# Patient Record
Sex: Female | Born: 1945
Health system: Southern US, Community
[De-identification: ages and names within clinical notes are randomized; demographics above are authoritative.]

## PROBLEM LIST (undated history)

## (undated) DIAGNOSIS — G4733 Obstructive sleep apnea (adult) (pediatric): Secondary | ICD-10-CM

## (undated) DIAGNOSIS — I517 Cardiomegaly: Secondary | ICD-10-CM

## (undated) DIAGNOSIS — I219 Acute myocardial infarction, unspecified: Secondary | ICD-10-CM

## (undated) DIAGNOSIS — M1990 Unspecified osteoarthritis, unspecified site: Secondary | ICD-10-CM

## (undated) DIAGNOSIS — M25569 Pain in unspecified knee: Secondary | ICD-10-CM

## (undated) DIAGNOSIS — E785 Hyperlipidemia, unspecified: Secondary | ICD-10-CM

## (undated) DIAGNOSIS — H00029 Hordeolum internum unspecified eye, unspecified eyelid: Secondary | ICD-10-CM

## (undated) DIAGNOSIS — I10 Essential (primary) hypertension: Secondary | ICD-10-CM

## (undated) DIAGNOSIS — N760 Acute vaginitis: Secondary | ICD-10-CM

## (undated) DIAGNOSIS — R300 Dysuria: Secondary | ICD-10-CM

## (undated) DIAGNOSIS — Z860100 Personal history of colon polyps, unspecified: Secondary | ICD-10-CM

## (undated) DIAGNOSIS — N952 Postmenopausal atrophic vaginitis: Secondary | ICD-10-CM

## (undated) DIAGNOSIS — B373 Candidiasis of vulva and vagina: Secondary | ICD-10-CM

## (undated) DIAGNOSIS — K219 Gastro-esophageal reflux disease without esophagitis: Secondary | ICD-10-CM

## (undated) DIAGNOSIS — E039 Hypothyroidism, unspecified: Secondary | ICD-10-CM

## (undated) DIAGNOSIS — L0291 Cutaneous abscess, unspecified: Secondary | ICD-10-CM

## (undated) DIAGNOSIS — B3731 Acute candidiasis of vulva and vagina: Secondary | ICD-10-CM

## (undated) DIAGNOSIS — R339 Retention of urine, unspecified: Secondary | ICD-10-CM

## (undated) DIAGNOSIS — H019 Unspecified inflammation of eyelid: Secondary | ICD-10-CM

## (undated) DIAGNOSIS — Z9841 Cataract extraction status, right eye: Secondary | ICD-10-CM

## (undated) DIAGNOSIS — N1330 Unspecified hydronephrosis: Secondary | ICD-10-CM

## (undated) DIAGNOSIS — Z9842 Cataract extraction status, left eye: Secondary | ICD-10-CM

## (undated) DIAGNOSIS — A6000 Herpesviral infection of urogenital system, unspecified: Secondary | ICD-10-CM

## (undated) DIAGNOSIS — G4730 Sleep apnea, unspecified: Secondary | ICD-10-CM

## (undated) DIAGNOSIS — R319 Hematuria, unspecified: Secondary | ICD-10-CM

## (undated) DIAGNOSIS — R109 Unspecified abdominal pain: Secondary | ICD-10-CM

## (undated) DIAGNOSIS — R040 Epistaxis: Secondary | ICD-10-CM

## (undated) DIAGNOSIS — B009 Herpesviral infection, unspecified: Secondary | ICD-10-CM

## (undated) DIAGNOSIS — N361 Urethral diverticulum: Secondary | ICD-10-CM

## (undated) DIAGNOSIS — J309 Allergic rhinitis, unspecified: Secondary | ICD-10-CM

## (undated) DIAGNOSIS — M5430 Sciatica, unspecified side: Secondary | ICD-10-CM

## (undated) DIAGNOSIS — Z7902 Long term (current) use of antithrombotics/antiplatelets: Secondary | ICD-10-CM

## (undated) DIAGNOSIS — E538 Deficiency of other specified B group vitamins: Secondary | ICD-10-CM

## (undated) DIAGNOSIS — H9209 Otalgia, unspecified ear: Secondary | ICD-10-CM

## (undated) DIAGNOSIS — N390 Urinary tract infection, site not specified: Secondary | ICD-10-CM

## (undated) DIAGNOSIS — R001 Bradycardia, unspecified: Secondary | ICD-10-CM

## (undated) DIAGNOSIS — K5790 Diverticulosis of intestine, part unspecified, without perforation or abscess without bleeding: Secondary | ICD-10-CM

## (undated) DIAGNOSIS — L0390 Cellulitis, unspecified: Secondary | ICD-10-CM

## (undated) DIAGNOSIS — K649 Unspecified hemorrhoids: Secondary | ICD-10-CM

## (undated) DIAGNOSIS — E559 Vitamin D deficiency, unspecified: Secondary | ICD-10-CM

## (undated) DIAGNOSIS — E2839 Other primary ovarian failure: Secondary | ICD-10-CM

## (undated) DIAGNOSIS — R0602 Shortness of breath: Secondary | ICD-10-CM

## (undated) DIAGNOSIS — I452 Bifascicular block: Secondary | ICD-10-CM

## (undated) DIAGNOSIS — Z9889 Other specified postprocedural states: Secondary | ICD-10-CM

## (undated) DIAGNOSIS — I700 Atherosclerosis of aorta: Secondary | ICD-10-CM

## (undated) HISTORY — DX: Herpesviral infection, unspecified: B00.9

## (undated) HISTORY — DX: Other primary ovarian failure: E28.39

## (undated) HISTORY — DX: Unspecified hemorrhoids: K64.9

## (undated) HISTORY — DX: Acute candidiasis of vulva and vagina: B37.31

## (undated) HISTORY — DX: Sciatica, unspecified side: M54.30

## (undated) HISTORY — PX: OTHER SURGICAL HISTORY: SHX169

## (undated) HISTORY — PX: COLON SURGERY: SHX602

## (undated) HISTORY — DX: Unspecified abdominal pain: R10.9

## (undated) HISTORY — DX: Cutaneous abscess, unspecified: L02.91

## (undated) HISTORY — DX: Cellulitis, unspecified: L03.90

## (undated) HISTORY — DX: Acute vaginitis: N76.0

## (undated) HISTORY — DX: Postmenopausal atrophic vaginitis: N95.2

## (undated) HISTORY — DX: Candidiasis of vulva and vagina: B37.3

## (undated) HISTORY — DX: Essential (primary) hypertension: I10

## (undated) HISTORY — DX: Unspecified hydronephrosis: N13.30

## (undated) HISTORY — PX: BREAST BIOPSY: SHX20

## (undated) HISTORY — DX: Hypothyroidism, unspecified: E03.9

## (undated) HISTORY — DX: Hematuria, unspecified: R31.9

## (undated) HISTORY — DX: Otalgia, unspecified ear: H92.09

## (undated) HISTORY — DX: Retention of urine, unspecified: R33.9

## (undated) HISTORY — DX: Pain in unspecified knee: M25.569

## (undated) HISTORY — DX: Allergic rhinitis, unspecified: J30.9

## (undated) HISTORY — DX: Hordeolum internum unspecified eye, unspecified eyelid: H00.029

## (undated) HISTORY — DX: Dysuria: R30.0

## (undated) HISTORY — PX: ECTOPIC PREGNANCY SURGERY: SHX613

## (undated) HISTORY — DX: Herpesviral infection of urogenital system, unspecified: A60.00

## (undated) HISTORY — DX: Shortness of breath: R06.02

## (undated) HISTORY — DX: Urethral diverticulum: N36.1

## (undated) HISTORY — DX: Urinary tract infection, site not specified: N39.0

## (undated) HISTORY — PX: HERNIA REPAIR: SHX51

## (undated) HISTORY — DX: Diverticulosis of intestine, part unspecified, without perforation or abscess without bleeding: K57.90

## (undated) HISTORY — DX: Unspecified inflammation of eyelid: H01.9

---

## 2004-04-01 ENCOUNTER — Other Ambulatory Visit: Payer: Self-pay

## 2004-08-06 ENCOUNTER — Inpatient Hospital Stay: Payer: Self-pay | Admitting: Internal Medicine

## 2005-08-07 ENCOUNTER — Ambulatory Visit: Payer: Self-pay

## 2006-01-07 ENCOUNTER — Ambulatory Visit: Payer: Self-pay

## 2006-07-23 ENCOUNTER — Ambulatory Visit: Payer: Self-pay | Admitting: Obstetrics and Gynecology

## 2006-07-29 ENCOUNTER — Ambulatory Visit: Payer: Self-pay | Admitting: Obstetrics and Gynecology

## 2006-09-08 ENCOUNTER — Other Ambulatory Visit: Payer: Self-pay

## 2006-09-15 ENCOUNTER — Ambulatory Visit: Payer: Self-pay | Admitting: Obstetrics and Gynecology

## 2007-03-19 ENCOUNTER — Ambulatory Visit: Payer: Self-pay | Admitting: Obstetrics and Gynecology

## 2007-07-19 ENCOUNTER — Ambulatory Visit: Payer: Self-pay | Admitting: Internal Medicine

## 2008-05-17 ENCOUNTER — Ambulatory Visit: Payer: Self-pay | Admitting: Obstetrics and Gynecology

## 2009-11-13 ENCOUNTER — Ambulatory Visit: Payer: Self-pay | Admitting: Obstetrics and Gynecology

## 2010-03-28 ENCOUNTER — Ambulatory Visit: Payer: Self-pay | Admitting: Internal Medicine

## 2010-05-17 ENCOUNTER — Ambulatory Visit: Payer: Self-pay | Admitting: Internal Medicine

## 2010-05-17 ENCOUNTER — Inpatient Hospital Stay: Payer: Self-pay | Admitting: Surgery

## 2010-05-31 ENCOUNTER — Emergency Department: Payer: Self-pay | Admitting: Emergency Medicine

## 2010-06-03 ENCOUNTER — Ambulatory Visit: Payer: Self-pay | Admitting: Surgery

## 2010-06-29 ENCOUNTER — Inpatient Hospital Stay: Payer: Self-pay | Admitting: Surgery

## 2010-07-19 ENCOUNTER — Ambulatory Visit: Payer: Self-pay | Admitting: Internal Medicine

## 2010-08-13 ENCOUNTER — Ambulatory Visit: Payer: Self-pay | Admitting: Surgery

## 2010-08-19 ENCOUNTER — Ambulatory Visit: Payer: Self-pay | Admitting: Anesthesiology

## 2010-08-20 ENCOUNTER — Inpatient Hospital Stay: Payer: Self-pay | Admitting: Surgery

## 2010-08-22 LAB — PATHOLOGY REPORT

## 2010-08-28 ENCOUNTER — Emergency Department: Payer: Self-pay | Admitting: Emergency Medicine

## 2010-09-03 ENCOUNTER — Ambulatory Visit: Payer: Self-pay | Admitting: Cardiovascular Disease

## 2011-02-27 ENCOUNTER — Emergency Department: Payer: Self-pay | Admitting: Emergency Medicine

## 2011-07-15 ENCOUNTER — Ambulatory Visit: Payer: Self-pay

## 2011-08-28 IMAGING — CR DG CHEST 1V PORT
1 series · 1 of 1 positions shown · non-contrast
Comparison: none

REASON FOR EXAM: pre op
COMMENTS:

PROCEDURE:     DXR - DXR PORTABLE CHEST SINGLE VIEW  - May 17, 2010  [DATE]
RESULT:     The lungs are mildly hypoinflated but the film is taken in a
somewhat lordotic position. There is no focal infiltrate. The cardiac
silhouette is top normal in size. The pulmonary vascularity is not engorged.

[view not recorded]
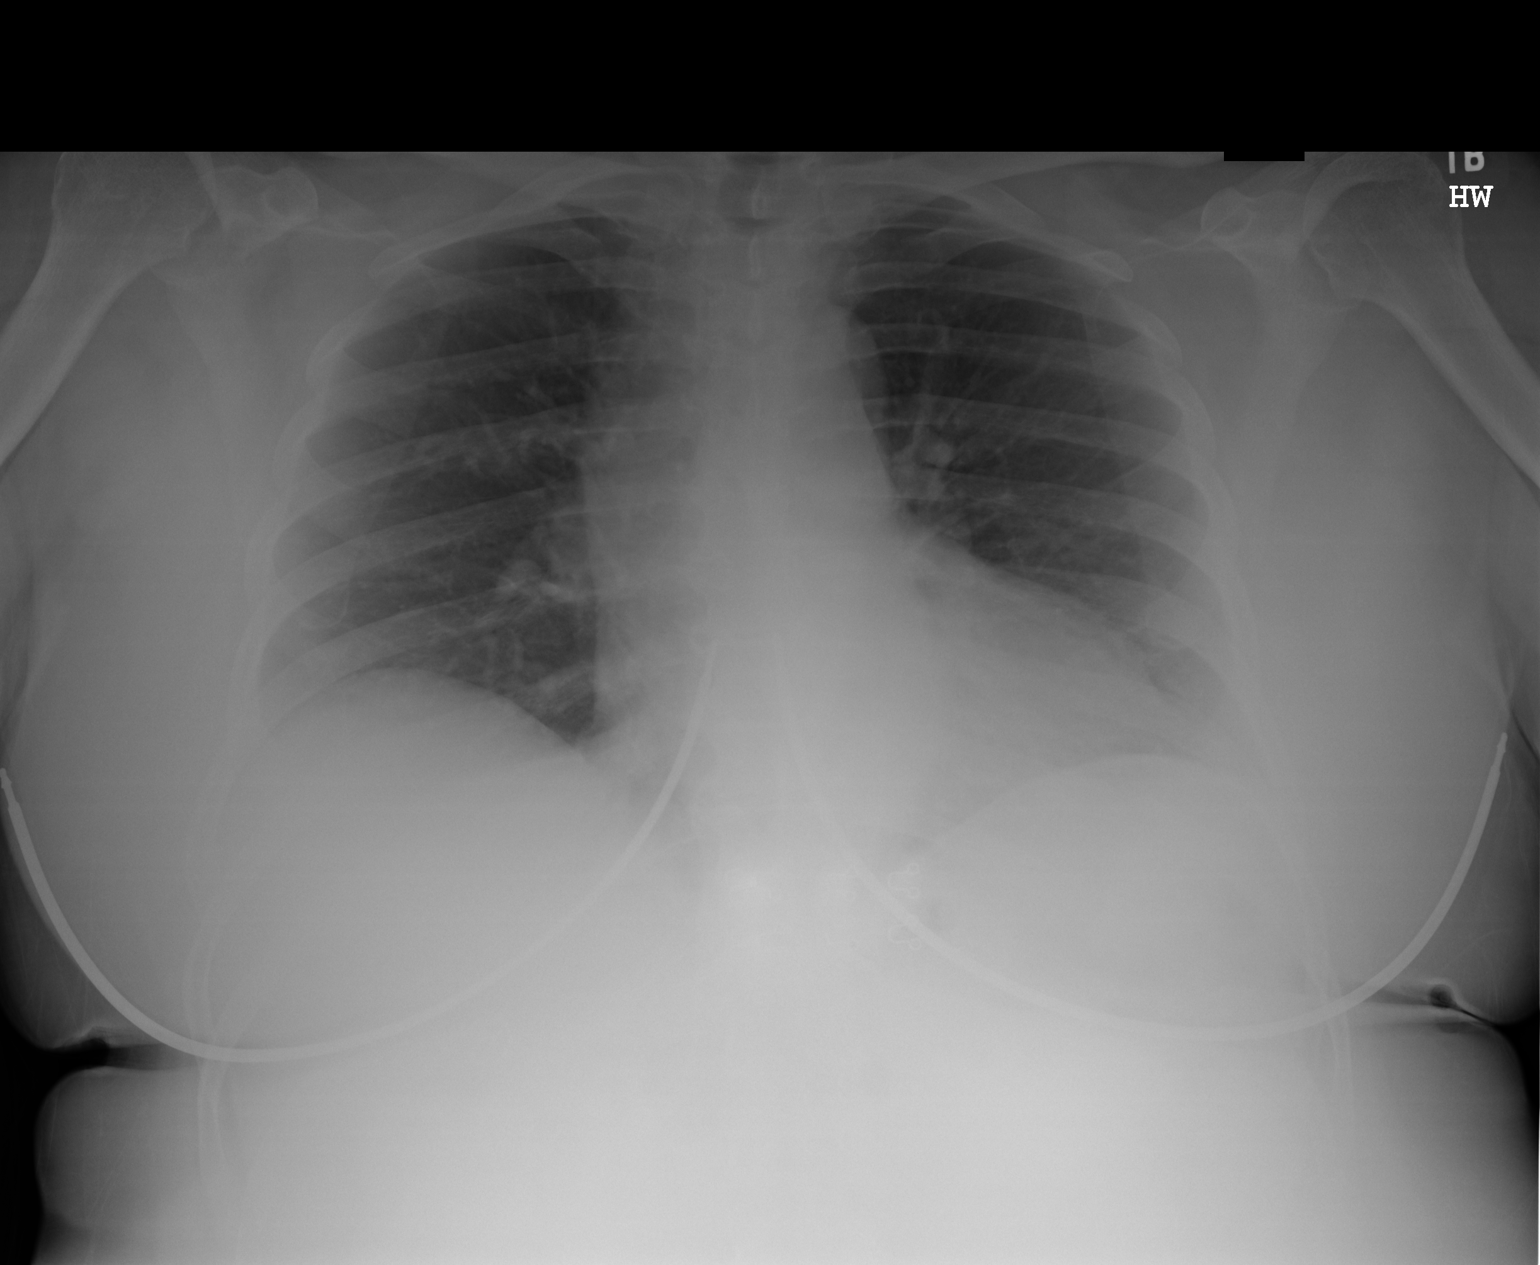

[1 of 1 positions shown; findings below may reference images not displayed]

IMPRESSION: I do not see evidence of acute cardiopulmonary abnormality.

## 2011-08-29 IMAGING — CR DG ABDOMEN 2V
1 series · 2 of 2 positions shown · non-contrast
Comparison: none

REASON FOR EXAM: Perforated diverticulitis
COMMENTS:

[Series 1: view not recorded · 0.17mm/px · 2 of 2 slices shown]
[im 1/2]
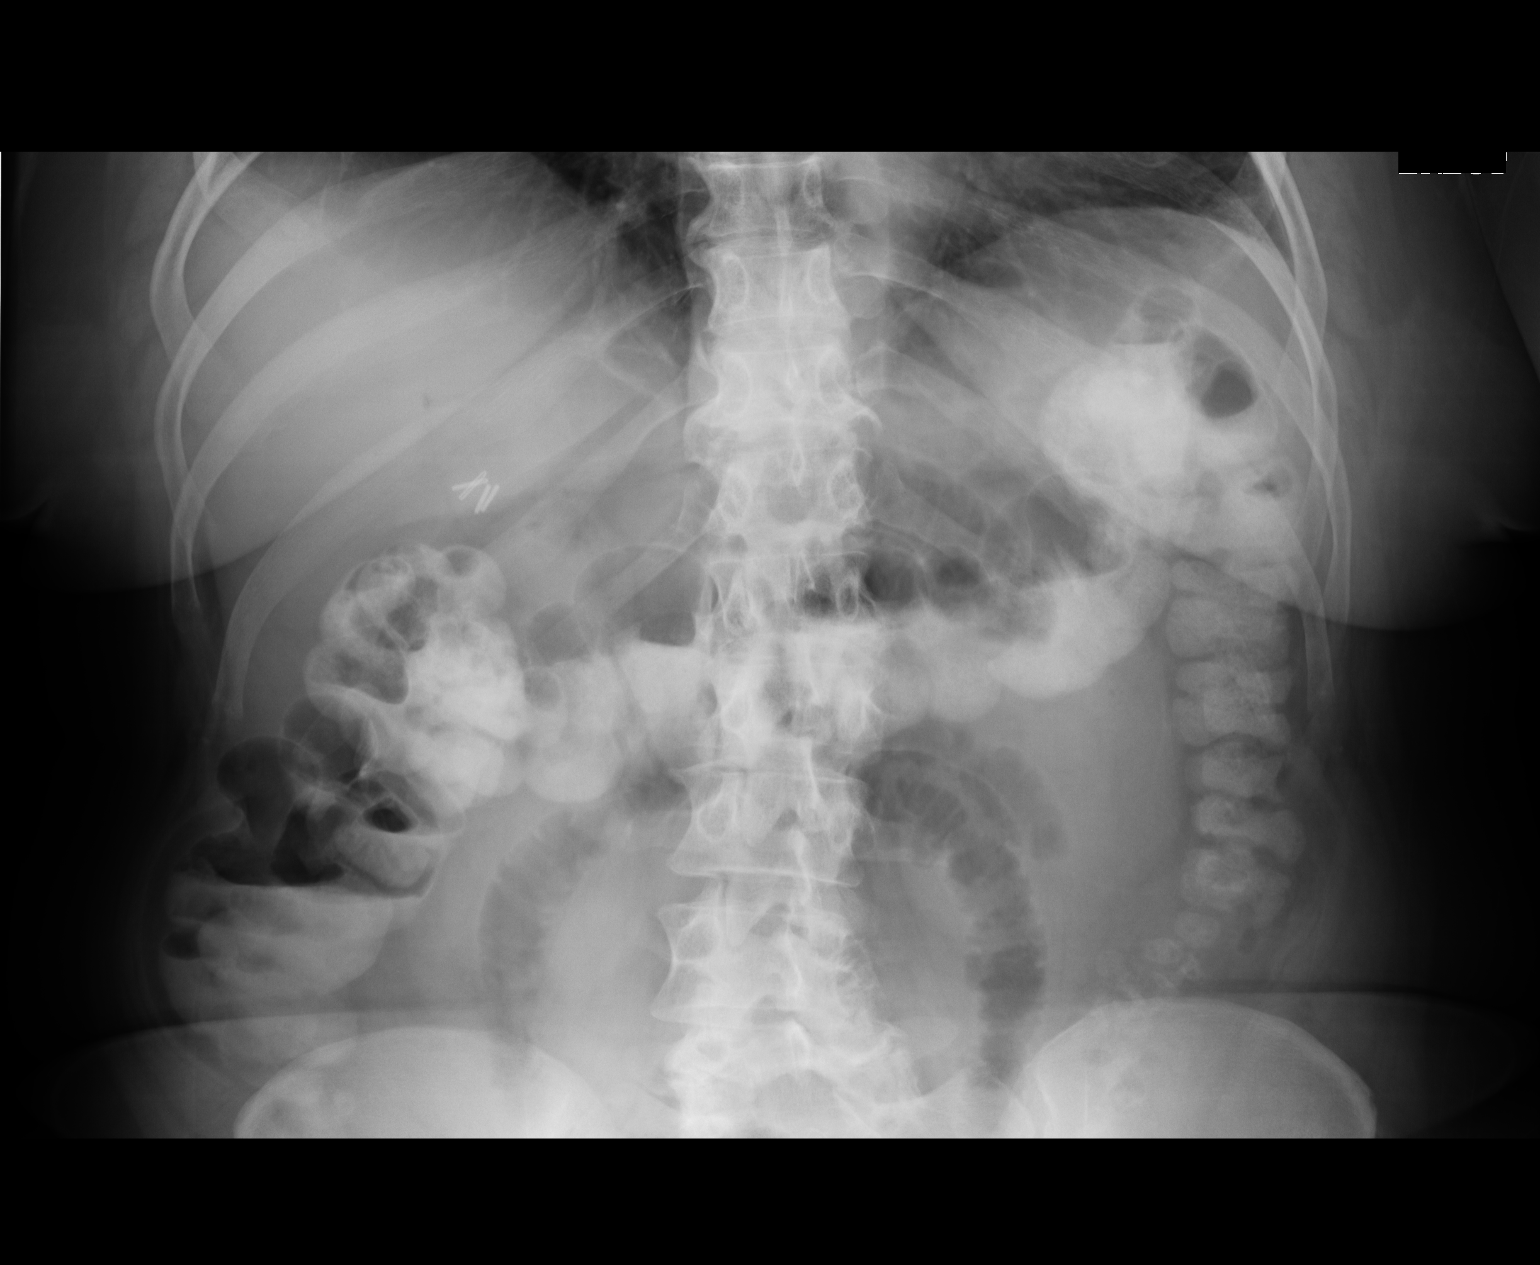
[im 2/2]
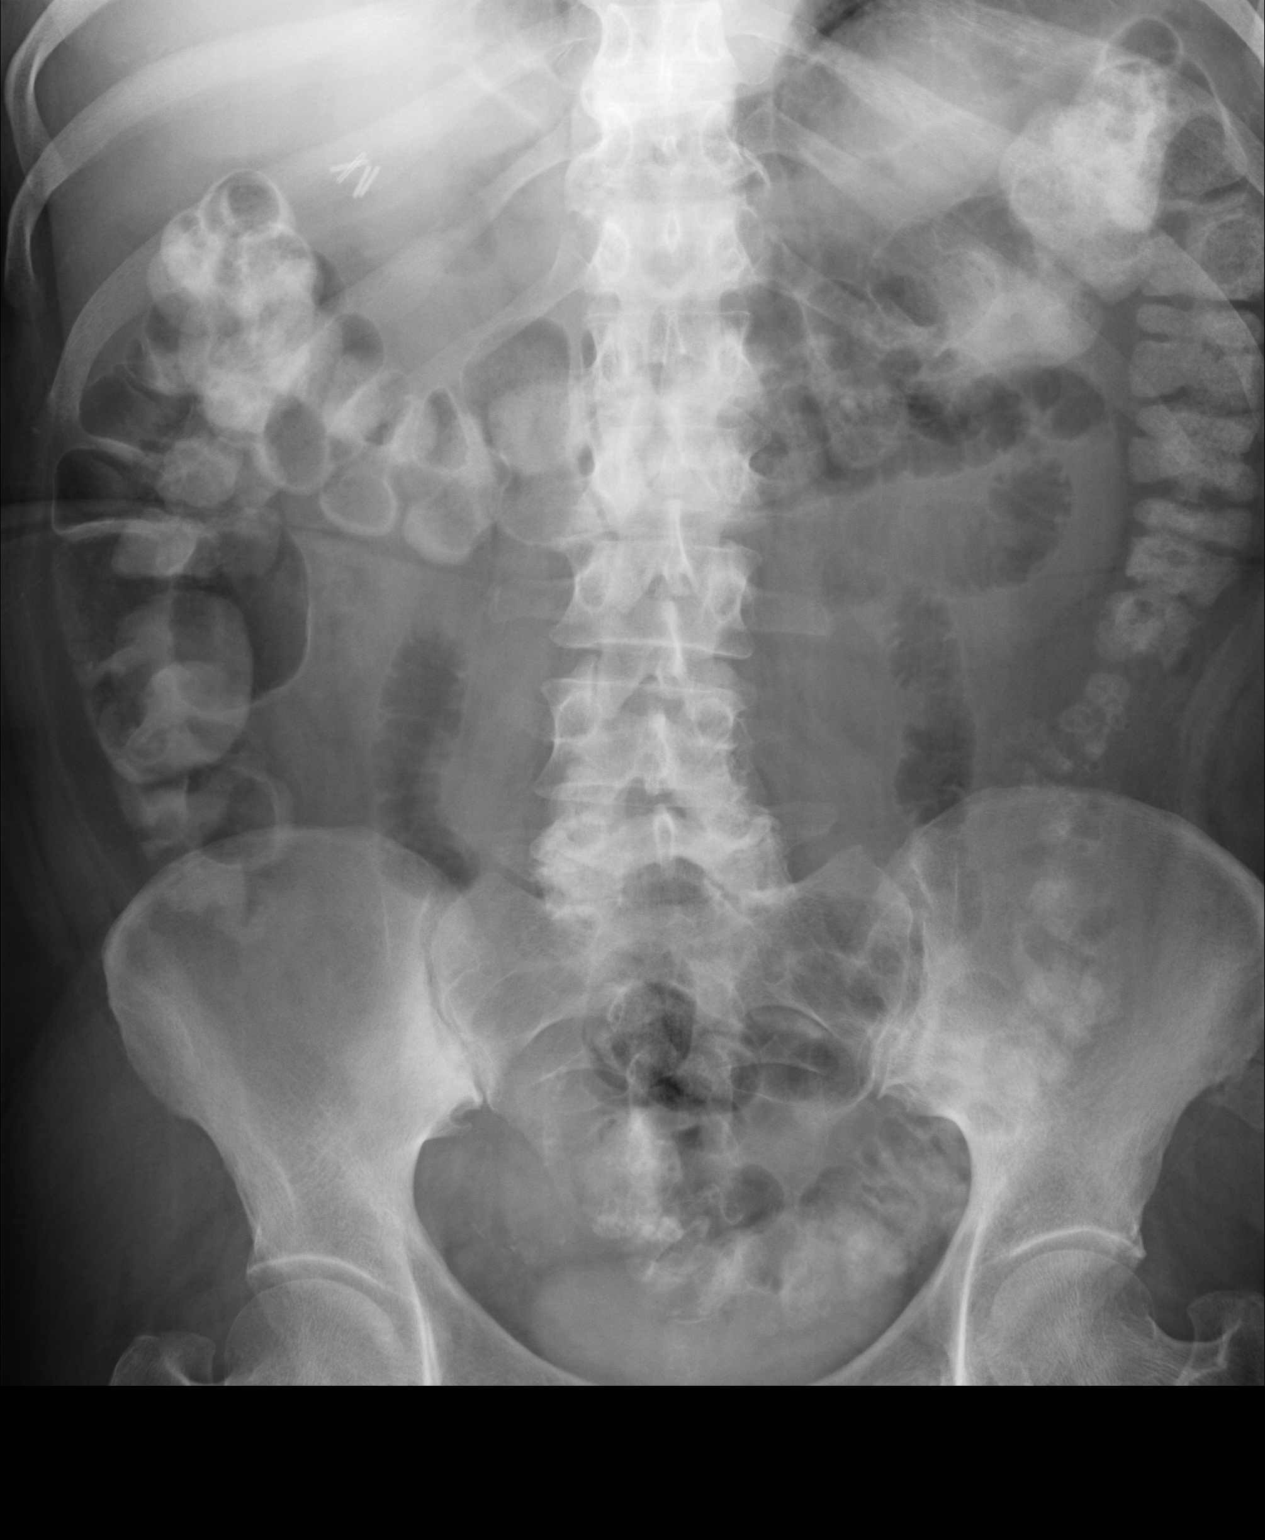

[2 of 2 positions shown; findings below may reference images not displayed]

PROCEDURE:     DXR - DXR ABDOMEN 2 V FLAT AND ERECT  - May 18, 2010  [DATE]

RESULT:     Supine and upright abdominal films are submitted. There is
contrast present within a relatively normal appearing colon from prior CT
scan 17 May, 2010. There is a small amount of air within minimally
prominent small bowel loops. There is contrast within the urinary bladder.
There are degenerative changes of the lower lumbar spine. There are surgical
clips in the gallbladder fossa.
IMPRESSION: I do not see evidence of bowel obstruction. Minimal
correction mildly distended gas-filled loops of small bowel in the midline
are present which may reflect a mild ileus.

## 2011-11-24 IMAGING — CR DG CHEST 2V
1 series · 2 of 2 positions shown · non-contrast
Comparison: none

REASON FOR EXAM: [DATE]----HTN
COMMENTS:

[Series 1: view not recorded · 0.17mm/px · 2 of 2 slices shown]
[im 1/2]
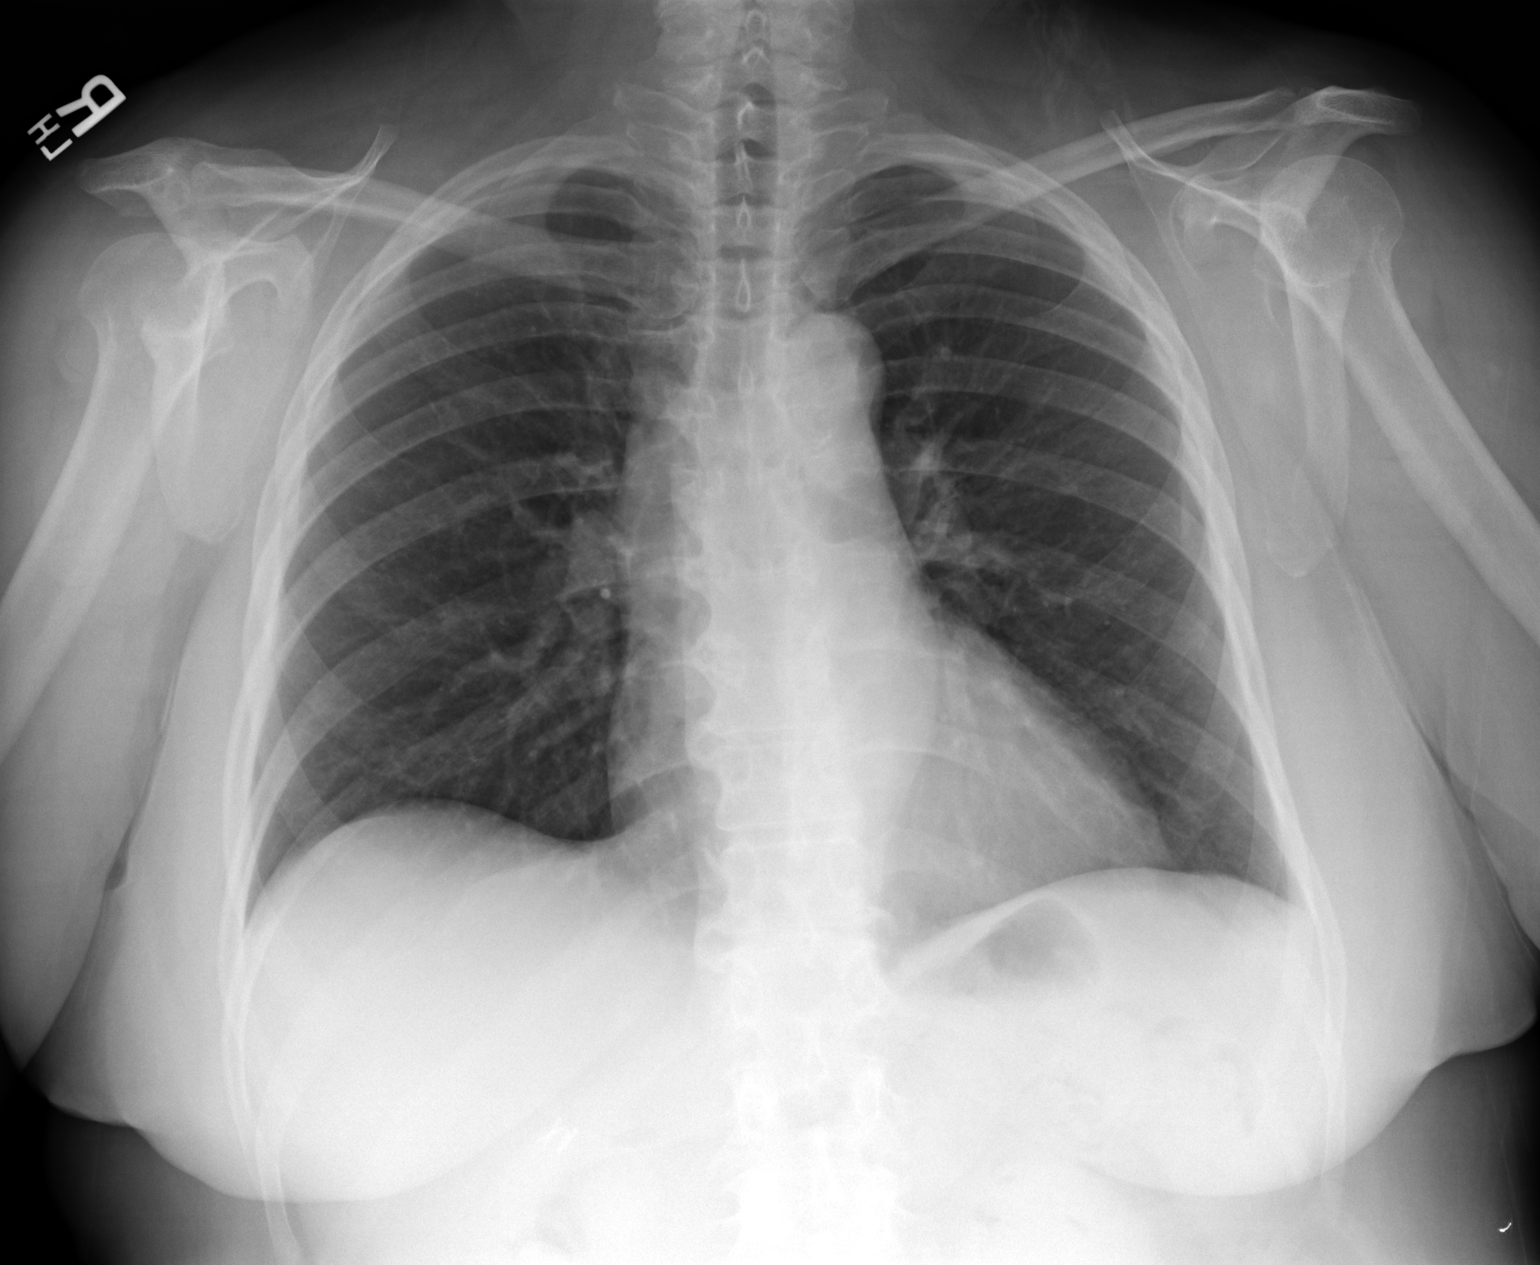
[im 2/2]
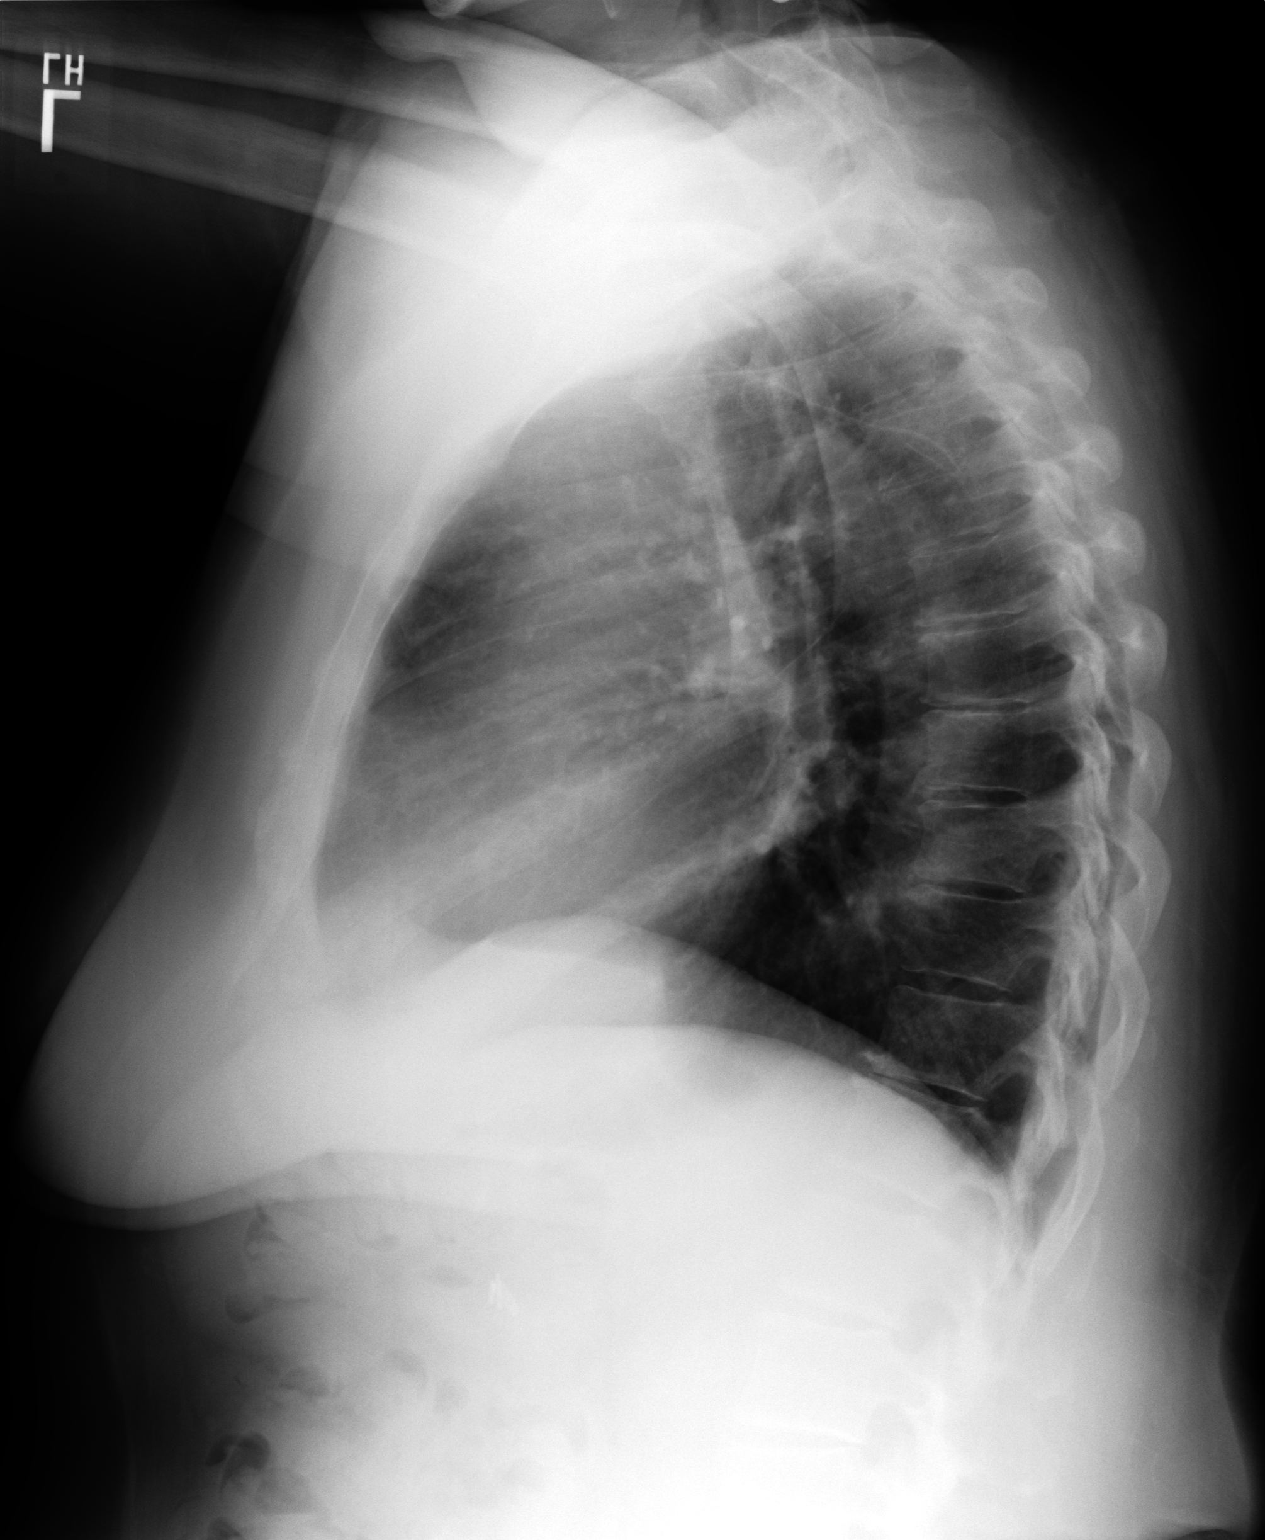

[2 of 2 positions shown; findings below may reference images not displayed]

PROCEDURE:     DXR - DXR CHEST PA (OR AP) AND LATERAL  - August 13, 2010  [DATE]

RESULT:     Comparison is made to the prior exam of 05/17/2010. The lung
fields are clear. The heart, mediastinal and osseous structures show no
acute changes. Hypertrophic spurring is present at multiple levels of the
thoracic spine.
IMPRESSION: 1. No acute changes are identified.
2. The lung fields are clear.
3. No findings suspicious for metastatic disease are identified.

## 2011-12-24 ENCOUNTER — Ambulatory Visit: Payer: Self-pay | Admitting: Surgery

## 2011-12-26 ENCOUNTER — Emergency Department: Payer: Self-pay | Admitting: Emergency Medicine

## 2011-12-29 ENCOUNTER — Emergency Department: Payer: Self-pay | Admitting: Emergency Medicine

## 2012-02-05 ENCOUNTER — Ambulatory Visit: Payer: Self-pay | Admitting: Surgery

## 2012-02-05 LAB — CBC WITH DIFFERENTIAL/PLATELET
Basophil #: 0 10*3/uL (ref 0.0–0.1)
Basophil %: 0.5 %
Eosinophil #: 0.2 10*3/uL (ref 0.0–0.7)
Eosinophil %: 3.1 %
HCT: 40.4 % (ref 35.0–47.0)
HGB: 13.7 g/dL (ref 12.0–16.0)
Lymphocyte #: 1.7 10*3/uL (ref 1.0–3.6)
Lymphocyte %: 27.9 %
Monocyte #: 0.5 x10 3/mm (ref 0.2–0.9)
Monocyte %: 8 %
Neutrophil %: 60.5 %
Platelet: 202 10*3/uL (ref 150–440)
WBC: 6 10*3/uL (ref 3.6–11.0)

## 2012-02-05 LAB — BASIC METABOLIC PANEL
Anion Gap: 7 (ref 7–16)
BUN: 18 mg/dL (ref 7–18)
Calcium, Total: 8.8 mg/dL (ref 8.5–10.1)
Chloride: 104 mmol/L (ref 98–107)
Co2: 29 mmol/L (ref 21–32)
EGFR (African American): >60
Glucose: 75 mg/dL (ref 65–99)
Osmolality: 280 (ref 275–301)
Potassium: 4.2 mmol/L (ref 3.5–5.1)
Sodium: 140 mmol/L (ref 136–145)

## 2012-02-13 ENCOUNTER — Inpatient Hospital Stay: Payer: Self-pay | Admitting: Surgery

## 2012-02-14 LAB — CBC WITH DIFFERENTIAL/PLATELET
Basophil #: 0 10*3/uL (ref 0.0–0.1)
Basophil %: 0.2 %
HCT: 36.3 % (ref 35.0–47.0)
HGB: 12.4 g/dL (ref 12.0–16.0)
Lymphocyte #: 1 10*3/uL (ref 1.0–3.6)
MCH: 29.7 pg (ref 26.0–34.0)
MCHC: 34.2 g/dL (ref 32.0–36.0)
Monocyte %: 7.1 %
Neutrophil %: 80.1 %
RBC: 4.18 10*6/uL (ref 3.80–5.20)
RDW: 14.3 % (ref 11.5–14.5)

## 2012-02-14 LAB — BASIC METABOLIC PANEL
Anion Gap: 9 (ref 7–16)
Calcium, Total: 7.7 mg/dL — ABNORMAL LOW (ref 8.5–10.1)
Chloride: 105 mmol/L (ref 98–107)
EGFR (African American): >60
EGFR (Non-African Amer.): >60
Osmolality: 280 (ref 275–301)
Potassium: 3.7 mmol/L (ref 3.5–5.1)
Sodium: 139 mmol/L (ref 136–145)

## 2012-09-16 ENCOUNTER — Ambulatory Visit: Payer: Self-pay

## 2013-12-16 ENCOUNTER — Ambulatory Visit: Payer: Self-pay

## 2014-01-26 ENCOUNTER — Ambulatory Visit: Payer: Self-pay | Admitting: Gastroenterology

## 2014-10-16 DIAGNOSIS — J301 Allergic rhinitis due to pollen: Secondary | ICD-10-CM | POA: Diagnosis not present

## 2014-10-16 DIAGNOSIS — J3081 Allergic rhinitis due to animal (cat) (dog) hair and dander: Secondary | ICD-10-CM | POA: Diagnosis not present

## 2014-10-16 DIAGNOSIS — J3089 Other allergic rhinitis: Secondary | ICD-10-CM | POA: Diagnosis not present

## 2014-10-25 DIAGNOSIS — R339 Retention of urine, unspecified: Secondary | ICD-10-CM | POA: Diagnosis not present

## 2014-10-25 DIAGNOSIS — N952 Postmenopausal atrophic vaginitis: Secondary | ICD-10-CM | POA: Diagnosis not present

## 2014-10-25 DIAGNOSIS — N390 Urinary tract infection, site not specified: Secondary | ICD-10-CM | POA: Diagnosis not present

## 2014-11-02 DIAGNOSIS — J3089 Other allergic rhinitis: Secondary | ICD-10-CM | POA: Diagnosis not present

## 2014-11-02 DIAGNOSIS — J301 Allergic rhinitis due to pollen: Secondary | ICD-10-CM | POA: Diagnosis not present

## 2014-11-02 DIAGNOSIS — J3081 Allergic rhinitis due to animal (cat) (dog) hair and dander: Secondary | ICD-10-CM | POA: Diagnosis not present

## 2014-11-08 DIAGNOSIS — J301 Allergic rhinitis due to pollen: Secondary | ICD-10-CM | POA: Diagnosis not present

## 2014-11-08 DIAGNOSIS — J3081 Allergic rhinitis due to animal (cat) (dog) hair and dander: Secondary | ICD-10-CM | POA: Diagnosis not present

## 2014-11-08 DIAGNOSIS — J3089 Other allergic rhinitis: Secondary | ICD-10-CM | POA: Diagnosis not present

## 2014-11-13 DIAGNOSIS — J301 Allergic rhinitis due to pollen: Secondary | ICD-10-CM | POA: Diagnosis not present

## 2014-11-13 DIAGNOSIS — J3089 Other allergic rhinitis: Secondary | ICD-10-CM | POA: Diagnosis not present

## 2014-11-13 DIAGNOSIS — J3081 Allergic rhinitis due to animal (cat) (dog) hair and dander: Secondary | ICD-10-CM | POA: Diagnosis not present

## 2014-11-23 DIAGNOSIS — B373 Candidiasis of vulva and vagina: Secondary | ICD-10-CM | POA: Diagnosis not present

## 2014-11-23 DIAGNOSIS — B009 Herpesviral infection, unspecified: Secondary | ICD-10-CM | POA: Diagnosis not present

## 2014-11-23 DIAGNOSIS — I10 Essential (primary) hypertension: Secondary | ICD-10-CM | POA: Diagnosis not present

## 2014-11-23 DIAGNOSIS — J3089 Other allergic rhinitis: Secondary | ICD-10-CM | POA: Diagnosis not present

## 2014-11-27 DIAGNOSIS — J3081 Allergic rhinitis due to animal (cat) (dog) hair and dander: Secondary | ICD-10-CM | POA: Diagnosis not present

## 2014-11-27 DIAGNOSIS — J3089 Other allergic rhinitis: Secondary | ICD-10-CM | POA: Diagnosis not present

## 2014-11-27 DIAGNOSIS — J301 Allergic rhinitis due to pollen: Secondary | ICD-10-CM | POA: Diagnosis not present

## 2014-12-11 DIAGNOSIS — J3089 Other allergic rhinitis: Secondary | ICD-10-CM | POA: Diagnosis not present

## 2014-12-11 DIAGNOSIS — J301 Allergic rhinitis due to pollen: Secondary | ICD-10-CM | POA: Diagnosis not present

## 2014-12-11 DIAGNOSIS — J3081 Allergic rhinitis due to animal (cat) (dog) hair and dander: Secondary | ICD-10-CM | POA: Diagnosis not present

## 2014-12-20 DIAGNOSIS — Z1231 Encounter for screening mammogram for malignant neoplasm of breast: Secondary | ICD-10-CM | POA: Diagnosis not present

## 2014-12-27 DIAGNOSIS — R339 Retention of urine, unspecified: Secondary | ICD-10-CM | POA: Diagnosis not present

## 2014-12-27 DIAGNOSIS — J3089 Other allergic rhinitis: Secondary | ICD-10-CM | POA: Diagnosis not present

## 2014-12-27 DIAGNOSIS — R030 Elevated blood-pressure reading, without diagnosis of hypertension: Secondary | ICD-10-CM | POA: Diagnosis not present

## 2014-12-27 DIAGNOSIS — N952 Postmenopausal atrophic vaginitis: Secondary | ICD-10-CM | POA: Diagnosis not present

## 2014-12-27 DIAGNOSIS — J3081 Allergic rhinitis due to animal (cat) (dog) hair and dander: Secondary | ICD-10-CM | POA: Diagnosis not present

## 2014-12-27 DIAGNOSIS — J301 Allergic rhinitis due to pollen: Secondary | ICD-10-CM | POA: Diagnosis not present

## 2014-12-29 DIAGNOSIS — R928 Other abnormal and inconclusive findings on diagnostic imaging of breast: Secondary | ICD-10-CM | POA: Diagnosis not present

## 2014-12-29 DIAGNOSIS — R921 Mammographic calcification found on diagnostic imaging of breast: Secondary | ICD-10-CM | POA: Diagnosis not present

## 2015-01-10 DIAGNOSIS — J301 Allergic rhinitis due to pollen: Secondary | ICD-10-CM | POA: Diagnosis not present

## 2015-01-10 DIAGNOSIS — J3089 Other allergic rhinitis: Secondary | ICD-10-CM | POA: Diagnosis not present

## 2015-01-10 DIAGNOSIS — J3081 Allergic rhinitis due to animal (cat) (dog) hair and dander: Secondary | ICD-10-CM | POA: Diagnosis not present

## 2015-01-23 DIAGNOSIS — J301 Allergic rhinitis due to pollen: Secondary | ICD-10-CM | POA: Diagnosis not present

## 2015-01-23 DIAGNOSIS — H1045 Other chronic allergic conjunctivitis: Secondary | ICD-10-CM | POA: Diagnosis not present

## 2015-01-23 DIAGNOSIS — J3089 Other allergic rhinitis: Secondary | ICD-10-CM | POA: Diagnosis not present

## 2015-01-23 DIAGNOSIS — R0602 Shortness of breath: Secondary | ICD-10-CM | POA: Diagnosis not present

## 2015-01-28 NOTE — Op Note (Signed)
PATIENT NAME:  Claudia Martin, Claudia Martin MR#:  [id number] DATE OF BIRTH:  1945-12-30  DATE OF PROCEDURE:  02/13/2012  PREOPERATIVE DIAGNOSIS: Ventral hernia.   POSTOPERATIVE DIAGNOSIS: Ventral hernia.   OPERATION: Ventral hernia repair with component separation.   SURGEON: Rodena Goldmann, III, MD   ANESTHESIA: General.   OPERATIVE PROCEDURE: With the patient in the supine position after induction of appropriate general anesthesia, the patient's abdomen was prepped with ChloraPrep and draped with sterile towels. The previous incision was ellipsed and removed without difficulty. The hernia sac was immediately encountered, dissected back to its base on all sides of the wound. Hemostasis was achieved with Bovie electrocautery. The sac was then opened and dissected free from the fascial edges, freshening the fascia  in circumferential fashion. Multiple adhesions were taken down from the omentum and bowel to the anterior abdominal wall. The area was cleared again in a circumferential fashion. The fascia was dissected back into the rectus muscle and the anterior fascia then opened from slightly above the defect to well below the defect on both sides. This maneuver allowed for the fascia with natural tissue to meet in the midline. AlloDerm was brought to the table, appropriately fashioned, and a 6 x 16 cm piece inserted in the subfascial space. It was sutured in place with vertical mattress sutures of 0 Prolene. It was stretched appropriately. The fascia was then closed over the mesh using running interlocking suture of doubled PDS. Intermittent sutures of 0 Vicryl were placed to help secure the running suture. Drains were placed using 19-French  Blake drains. Two lateral drains were placed into the dead space. Hemostasis appeared to be satisfactory. The umbilical skin was sutured to the anterior fascia using 0 Vicryl and the skin was clipped. Sterile dressings were applied and an abdominal binder applied. The patient was  returned to the recovery room having tolerated the procedure well. Sponge, instrument, and needle counts were correct x2 in the Operating Room.  ____________________________ Rodena Goldmann III, MD rle:cbb D: 02/13/2012 10:13:41 ET T: 02/13/2012 12:48:26 ET JOB#: [id number] cc: Lavera Guise, MD Rodena Goldmann MD ELECTRONICALLY SIGNED 02/14/2012 8:42

## 2015-01-28 NOTE — Discharge Summary (Signed)
PATIENT NAME:  Claudia Martin, Claudia Martin MR#:  [id number] DATE OF BIRTH:  [birth year]  DATE OF ADMISSION:  02/13/2012 DATE OF DISCHARGE:  02/17/2012  BRIEF HISTORY: Claudia Martin is a 69 year old woman admitted with a large ventral hernia. She had had a previous colon resection and was admitted for hernia repair post surgery. She has become increasingly symptomatic and would like to have the hernia repaired.   HOSPITAL COURSE: After appropriate preoperative preparation and informed consent, she was taken to surgery the morning 02/13/2012. She underwent a ventral hernia repair without difficulty. LifeCell AlloDerm was utilized in a Chief Operating Officer. Drains were placed. She had very slow return of bowel function but was able to ambulate and eat a regular diet by the 14th. There was no sign of any significant postoperative problems.   DISCHARGE MEDICATIONS:  1. Percocet 5/325 mg p.o. q.4-6 hours p.r.n.  2. Atenolol 25 mg p.o. b.i.d.  3. Aspirin 325 mg p.o. daily.  4. Medroxyprogesterone 2.5 mg p.o. daily.  5. Estrogen-methyltestos 6.25/1.25 mg p.o. daily.   FINAL DISCHARGE DIAGNOSIS: Ventral hernia.   SURGERY: Ventral hernia repair.   ____________________________ Micheline Maze, MD rle:rbg D: 02/27/2012 21:12:32 ET T: 03/01/2012 12:28:14 ET JOB#: [id number]  cc: Micheline Maze, MD, <Dictator> Lavera Guise, MD Rodena Goldmann MD ELECTRONICALLY SIGNED 03/01/2012 19:52

## 2015-02-05 DIAGNOSIS — N390 Urinary tract infection, site not specified: Secondary | ICD-10-CM | POA: Diagnosis not present

## 2015-02-05 DIAGNOSIS — I10 Essential (primary) hypertension: Secondary | ICD-10-CM | POA: Diagnosis not present

## 2015-02-05 DIAGNOSIS — J3089 Other allergic rhinitis: Secondary | ICD-10-CM | POA: Diagnosis not present

## 2015-02-05 DIAGNOSIS — R0602 Shortness of breath: Secondary | ICD-10-CM | POA: Diagnosis not present

## 2015-02-05 DIAGNOSIS — B373 Candidiasis of vulva and vagina: Secondary | ICD-10-CM | POA: Diagnosis not present

## 2015-02-05 DIAGNOSIS — J301 Allergic rhinitis due to pollen: Secondary | ICD-10-CM | POA: Diagnosis not present

## 2015-02-05 DIAGNOSIS — E2839 Other primary ovarian failure: Secondary | ICD-10-CM | POA: Diagnosis not present

## 2015-02-15 DIAGNOSIS — Z13820 Encounter for screening for osteoporosis: Secondary | ICD-10-CM | POA: Diagnosis not present

## 2015-02-15 DIAGNOSIS — E2839 Other primary ovarian failure: Secondary | ICD-10-CM | POA: Diagnosis not present

## 2015-02-15 DIAGNOSIS — Z780 Asymptomatic menopausal state: Secondary | ICD-10-CM | POA: Diagnosis not present

## 2015-02-19 DIAGNOSIS — J301 Allergic rhinitis due to pollen: Secondary | ICD-10-CM | POA: Diagnosis not present

## 2015-02-19 DIAGNOSIS — J3089 Other allergic rhinitis: Secondary | ICD-10-CM | POA: Diagnosis not present

## 2015-03-08 DIAGNOSIS — R0602 Shortness of breath: Secondary | ICD-10-CM | POA: Diagnosis not present

## 2015-03-08 DIAGNOSIS — J301 Allergic rhinitis due to pollen: Secondary | ICD-10-CM | POA: Diagnosis not present

## 2015-03-08 DIAGNOSIS — J3089 Other allergic rhinitis: Secondary | ICD-10-CM | POA: Diagnosis not present

## 2015-03-26 DIAGNOSIS — N390 Urinary tract infection, site not specified: Secondary | ICD-10-CM | POA: Diagnosis not present

## 2015-03-26 DIAGNOSIS — Z124 Encounter for screening for malignant neoplasm of cervix: Secondary | ICD-10-CM | POA: Diagnosis not present

## 2015-03-26 DIAGNOSIS — J3089 Other allergic rhinitis: Secondary | ICD-10-CM | POA: Diagnosis not present

## 2015-03-26 DIAGNOSIS — I10 Essential (primary) hypertension: Secondary | ICD-10-CM | POA: Diagnosis not present

## 2015-03-26 DIAGNOSIS — R300 Dysuria: Secondary | ICD-10-CM | POA: Diagnosis not present

## 2015-03-26 DIAGNOSIS — I517 Cardiomegaly: Secondary | ICD-10-CM | POA: Diagnosis not present

## 2015-03-26 DIAGNOSIS — J301 Allergic rhinitis due to pollen: Secondary | ICD-10-CM | POA: Diagnosis not present

## 2015-03-26 DIAGNOSIS — B009 Herpesviral infection, unspecified: Secondary | ICD-10-CM | POA: Diagnosis not present

## 2015-03-26 DIAGNOSIS — A6004 Herpesviral vulvovaginitis: Secondary | ICD-10-CM | POA: Diagnosis not present

## 2015-03-29 DIAGNOSIS — E039 Hypothyroidism, unspecified: Secondary | ICD-10-CM | POA: Diagnosis not present

## 2015-03-29 DIAGNOSIS — E559 Vitamin D deficiency, unspecified: Secondary | ICD-10-CM | POA: Diagnosis not present

## 2015-03-29 DIAGNOSIS — Z0001 Encounter for general adult medical examination with abnormal findings: Secondary | ICD-10-CM | POA: Diagnosis not present

## 2015-04-16 DIAGNOSIS — K1230 Oral mucositis (ulcerative), unspecified: Secondary | ICD-10-CM | POA: Diagnosis not present

## 2015-04-16 DIAGNOSIS — E2839 Other primary ovarian failure: Secondary | ICD-10-CM | POA: Diagnosis not present

## 2015-04-16 DIAGNOSIS — I517 Cardiomegaly: Secondary | ICD-10-CM | POA: Diagnosis not present

## 2015-04-16 DIAGNOSIS — I10 Essential (primary) hypertension: Secondary | ICD-10-CM | POA: Diagnosis not present

## 2015-05-16 DIAGNOSIS — R0683 Snoring: Secondary | ICD-10-CM | POA: Diagnosis not present

## 2015-05-16 DIAGNOSIS — I517 Cardiomegaly: Secondary | ICD-10-CM | POA: Diagnosis not present

## 2015-05-16 DIAGNOSIS — G4710 Hypersomnia, unspecified: Secondary | ICD-10-CM | POA: Diagnosis not present

## 2015-06-05 DIAGNOSIS — G4733 Obstructive sleep apnea (adult) (pediatric): Secondary | ICD-10-CM | POA: Diagnosis not present

## 2015-06-13 DIAGNOSIS — N390 Urinary tract infection, site not specified: Secondary | ICD-10-CM | POA: Diagnosis not present

## 2015-06-13 DIAGNOSIS — N771 Vaginitis, vulvitis and vulvovaginitis in diseases classified elsewhere: Secondary | ICD-10-CM | POA: Diagnosis not present

## 2015-06-13 DIAGNOSIS — G4733 Obstructive sleep apnea (adult) (pediatric): Secondary | ICD-10-CM | POA: Diagnosis not present

## 2015-06-26 ENCOUNTER — Other Ambulatory Visit: Payer: Self-pay

## 2015-06-26 ENCOUNTER — Encounter: Payer: Self-pay | Admitting: Emergency Medicine

## 2015-06-26 ENCOUNTER — Emergency Department: Payer: Medicare Other

## 2015-06-26 ENCOUNTER — Emergency Department
Admission: EM | Admit: 2015-06-26 | Discharge: 2015-06-26 | Disposition: A | Discharge status: Home / Self Care | Payer: Medicare Other | Attending: Emergency Medicine | Admitting: Emergency Medicine

## 2015-06-26 DIAGNOSIS — R070 Pain in throat: Secondary | ICD-10-CM | POA: Insufficient documentation

## 2015-06-26 DIAGNOSIS — B373 Candidiasis of vulva and vagina: Secondary | ICD-10-CM | POA: Diagnosis not present

## 2015-06-26 DIAGNOSIS — R6889 Other general symptoms and signs: Secondary | ICD-10-CM

## 2015-06-26 DIAGNOSIS — R0989 Other specified symptoms and signs involving the circulatory and respiratory systems: Secondary | ICD-10-CM

## 2015-06-26 DIAGNOSIS — J301 Allergic rhinitis due to pollen: Secondary | ICD-10-CM | POA: Diagnosis not present

## 2015-06-26 DIAGNOSIS — I10 Essential (primary) hypertension: Secondary | ICD-10-CM | POA: Insufficient documentation

## 2015-06-26 DIAGNOSIS — M25569 Pain in unspecified knee: Secondary | ICD-10-CM | POA: Diagnosis not present

## 2015-06-26 DIAGNOSIS — Z79899 Other long term (current) drug therapy: Secondary | ICD-10-CM | POA: Insufficient documentation

## 2015-06-26 DIAGNOSIS — T7800XD Anaphylactic reaction due to unspecified food, subsequent encounter: Secondary | ICD-10-CM | POA: Diagnosis not present

## 2015-06-26 DIAGNOSIS — R079 Chest pain, unspecified: Secondary | ICD-10-CM | POA: Diagnosis not present

## 2015-06-26 DIAGNOSIS — R0600 Dyspnea, unspecified: Secondary | ICD-10-CM

## 2015-06-26 DIAGNOSIS — R0602 Shortness of breath: Secondary | ICD-10-CM | POA: Diagnosis present

## 2015-06-26 LAB — BASIC METABOLIC PANEL
ANION GAP: 6 (ref 5–15)
BUN: 15 mg/dL (ref 6–20)
CO2: 28 mmol/L (ref 22–32)
Calcium: 9.2 mg/dL (ref 8.9–10.3)
Chloride: 105 mmol/L (ref 101–111)
Creatinine, Ser: 0.77 mg/dL (ref 0.44–1.00)
GFR calc Af Amer: >60 mL/min (ref >60)
GFR calc non Af Amer: >60 mL/min (ref >60)
Glucose, Bld: 124 mg/dL — ABNORMAL HIGH (ref 65–99)
Potassium: 3.2 mmol/L — ABNORMAL LOW (ref 3.5–5.1)
Sodium: 139 mmol/L (ref 135–145)

## 2015-06-26 LAB — CBC
HEMATOCRIT: 40.5 % (ref 35.0–47.0)
HEMOGLOBIN: 14.3 g/dL (ref 12.0–16.0)
MCH: 32.4 pg (ref 26.0–34.0)
MCHC: 35.4 g/dL (ref 32.0–36.0)
MCV: 91.6 fL (ref 80.0–100.0)
Platelets: 190 10*3/uL (ref 150–440)
RBC: 4.42 MIL/uL (ref 3.80–5.20)
RDW: 13.5 % (ref 11.5–14.5)
WBC: 6.3 10*3/uL (ref 3.6–11.0)

## 2015-06-26 LAB — BRAIN NATRIURETIC PEPTIDE: B Natriuretic Peptide: 69 pg/mL (ref 0.0–100.0)

## 2015-06-26 LAB — TROPONIN I: Troponin I: <0.03 ng/mL (ref <0.031)

## 2015-06-26 MED ORDER — DIPHENHYDRAMINE HCL 25 MG PO CAPS
25.0000 mg | ORAL_CAPSULE | Freq: Once | ORAL | Status: AC
  Administered 2015-06-26: 25 mg via ORAL
  Filled 2015-06-26: qty 1

## 2015-06-26 NOTE — ED Notes (Signed)
Patient assisted OOB to in room toilet. Patient reporting that she needed to void. Patient assisted back in to bed and positioned for comfort. Monitoring equipment replaced. Patient provided with PO fluids when med given just prior to her getting OOB. No verbalized needs at present. Will continue to monitor.

## 2015-06-26 NOTE — ED Provider Notes (Signed)
[facility] Emergency Department Provider Note  ____________________________________________  Time seen: Approximately 241 AM  I have reviewed the triage vital signs and the nursing notes.   HISTORY  Chief Complaint Chest Pain    HPI Claudia Martin is a 69 y.o. female who comes into the hospital today with shortness of breath. The patient reports that it was between 11pm and midnight and she had just said her prayers and she rolled over to go to sleep. The patient reports that she felt as though she couldn't breath so she laid on her back. The patient reports at that time she felt worse so she went to the bathroom and put a cold washcloth on her face. She reports that she felt as though her throat was closing. She continued feeling as though she couldn't breathe and couldn't get in any air. The patient reports that prior to laying down she had taken an Aleve and her blood pressure medicine. The patient reports that currently the symptoms have improved but she still feels little tightness throat. The patient denies any nausea vomiting or lightheadedness she also denies any chest pain. She reports that she felt very nervous and her hands were shaking so she decided to come in and get checked out. The patient feels as though she may be having an allergic reaction.   Past Medical History HTN Enlarged heart  There are no active problems to display for this patient.   Past Surgical History  Procedure Laterality Date  . Diverticulitis    . Hernia repair    . Ectopic pregnancy surgery    . Breast biopsy    . Fx thumb      Current Outpatient Rx  Name  Route  Sig  Dispense  Refill  . amLODipine (NORVASC) 2.5 MG tablet   Oral   Take 2.5 mg by mouth daily.         Marland Kitchen atenolol (TENORMIN) 25 MG tablet   Oral   Take 25 mg by mouth 2 (two) times daily. Pt takes 1 and 1/2 tablets twice a day.         . chlorhexidine (PERIDEX) 0.12 % solution  Mouth/Throat   Use as directed 5 mLs in the mouth or throat 2 (two) times daily. For 2 weeks         . levothyroxine (SYNTHROID, LEVOTHROID) 50 MCG tablet   Oral   Take 50 mcg by mouth daily before breakfast.         . nystatin cream (MYCOSTATIN)   Topical   Apply 1 application topically 2 (two) times daily.         . Olopatadine HCl (PATADAY) 0.2 % SOLN   Both Eyes   Place 1 drop into both eyes 2 (two) times daily.         . valACYclovir (VALTREX) 1000 MG tablet   Oral   Take 1,000 mg by mouth daily. Pt can 1 tablet twice a day if flare up for 7 days.           Allergies Ivp dye and Prednisone  No family history on file.  Social History Social History  Substance Use Topics  . Smoking status: Never Smoker   . Smokeless tobacco: None  . Alcohol Use: No    Review of Systems Constitutional: No fever/chills Eyes: No visual changes. ENT: No sore throat. Cardiovascular: Denies chest pain. Respiratory: shortness of breath. Gastrointestinal: No abdominal pain.  No nausea, no vomiting.  No diarrhea.  No constipation. Genitourinary: Negative for dysuria. Musculoskeletal: Negative for back pain. Skin: Negative for rash. Neurological: Negative for headaches, focal weakness or numbness.  10-point ROS otherwise negative.  ____________________________________________   PHYSICAL EXAM:  VITAL SIGNS: ED Triage Vitals  Enc Vitals Group     BP 06/26/15 0058 176/82 mmHg     Pulse Rate 06/26/15 0058 68     Resp 06/26/15 0239 13     Temp 06/26/15 0058 98.2 F (36.8 C)     Temp Source 06/26/15 0058 Oral     SpO2 06/26/15 0058 97 %     Weight 06/26/15 0058 185 lb (83.915 kg)     Height 06/26/15 0058 4\' 7"  (1.397 m)     Head Cir --      Peak Flow --      Pain Score --      Pain Loc --      Pain Edu? --      Excl. in [place]? --     Constitutional: Alert and oriented. Well appearing and in no acute distress. Eyes: Conjunctivae are normal. PERRL. EOMI. Head:  Atraumatic. Nose: No congestion/rhinnorhea. Mouth/Throat: Mucous membranes are moist.  Oropharynx non-erythematous. Cardiovascular: Normal rate, regular rhythm. Grossly normal heart sounds.  Good peripheral circulation. Respiratory: Normal respiratory effort.  No retractions. Lungs CTAB. Gastrointestinal: Soft and nontender. No distention. Positive bowel sounds Musculoskeletal: No lower extremity tenderness nor edema.   Neurologic:  Normal speech and language. No gross focal neurologic deficits are appreciated.  Skin:  Skin is warm, dry and intact. Psychiatric: Mood and affect are normal.   ____________________________________________   LABS (all labs ordered are listed, but only abnormal results are displayed)  Labs Reviewed  BASIC METABOLIC PANEL - Abnormal; Notable for the following:    Potassium 3.2 (*)    Glucose, Bld 124 (*)    All other components within normal limits  CBC  TROPONIN I  BRAIN NATRIURETIC PEPTIDE  TROPONIN I   ____________________________________________  EKG  ED ECG REPORT I, Loney Hering, the attending physician, personally viewed and interpreted this ECG.   Date: 06/26/2015  EKG Time: 105  Rate: 72  Rhythm: normal EKG, normal sinus rhythm, RBBB, left axis deviation  Axis: left  Intervals:none  ST&T Change: flipped t waves III, V1, V2, V3 seen on previous EKG  ____________________________________________  RADIOLOGY  CXR: No evidence of acute cardiopulmonary disease ____________________________________________   PROCEDURES  Procedure(s) performed: None  Critical Care performed: No  ____________________________________________   INITIAL IMPRESSION / ASSESSMENT AND PLAN / ED COURSE  Pertinent labs & imaging results that were available during my care of the patient were reviewed by me and considered in my medical decision making (see chart for details).  This is a 69 year old female who comes in today with some shortness of  breath. The patient does have a history of hypertension and enlarged heart. I well give the patient a dose of Benadryl as she feels this may be due to an allergic reaction. I will check a BNP and a repeat troponin and then reassess the patient.  Patient's blood work was unremarkable. She reports that she does feel improved. At this time I am unsure of the cause of the patient's dyspnea but she has not had any symptoms of that while on the emergency department. I will discharge the patient to home and have her follow-up with her primary care physician. ____________________________________________   FINAL CLINICAL IMPRESSION(S) / ED DIAGNOSES  Final diagnoses:  Dyspnea  Throat tightness      Loney Hering, MD 06/26/15 [phone]

## 2015-06-26 NOTE — Discharge Instructions (Signed)

## 2015-06-26 NOTE — ED Notes (Signed)
Patient ambulatory to triage with steady gait, without difficulty or distress noted; pt reports tightness in throat and upper chest since midnight with difficulty breathing and heart racing especially when lying supine; st hx of same but dx with anxiety and rx inhaler

## 2015-06-29 ENCOUNTER — Ambulatory Visit: Payer: Self-pay | Admitting: Obstetrics and Gynecology

## 2015-07-10 DIAGNOSIS — J301 Allergic rhinitis due to pollen: Secondary | ICD-10-CM | POA: Diagnosis not present

## 2015-07-10 DIAGNOSIS — G4733 Obstructive sleep apnea (adult) (pediatric): Secondary | ICD-10-CM | POA: Diagnosis not present

## 2015-07-10 DIAGNOSIS — R0602 Shortness of breath: Secondary | ICD-10-CM | POA: Diagnosis not present

## 2015-07-10 DIAGNOSIS — J3089 Other allergic rhinitis: Secondary | ICD-10-CM | POA: Diagnosis not present

## 2015-07-11 DIAGNOSIS — G4733 Obstructive sleep apnea (adult) (pediatric): Secondary | ICD-10-CM | POA: Diagnosis not present

## 2015-07-16 ENCOUNTER — Encounter: Payer: Self-pay | Admitting: Obstetrics and Gynecology

## 2015-07-16 ENCOUNTER — Ambulatory Visit (INDEPENDENT_AMBULATORY_CARE_PROVIDER_SITE_OTHER): Payer: Medicare Other | Admitting: Obstetrics and Gynecology

## 2015-07-16 VITALS — BP 138/84 | HR 82 | Resp 16 | Ht <= 58 in | Wt 183.0 lb

## 2015-07-16 DIAGNOSIS — R339 Retention of urine, unspecified: Secondary | ICD-10-CM | POA: Diagnosis not present

## 2015-07-16 DIAGNOSIS — N952 Postmenopausal atrophic vaginitis: Secondary | ICD-10-CM | POA: Diagnosis not present

## 2015-07-16 LAB — URINALYSIS, COMPLETE
BILIRUBIN UA: NEGATIVE
GLUCOSE, UA: NEGATIVE
NITRITE UA: NEGATIVE
RBC, UA: NEGATIVE
UUROB: 0.2 mg/dL (ref 0.2–1.0)
pH, UA: 5.5 (ref 5.0–7.5)

## 2015-07-16 LAB — MICROSCOPIC EXAMINATION: Renal Epithel, UA: NONE SEEN /hpf

## 2015-07-16 LAB — BLADDER SCAN AMB NON-IMAGING: SCAN RESULT: 66

## 2015-07-16 NOTE — Progress Notes (Signed)
07/16/2015 3:21 PM   Claudia Martin 1946/08/10 [id number]  Referring provider: Lavera Guise, MD [address] [place], [place] [address]  Chief Complaint  Patient presents with  . Vaginal Atrophy  . Incomplete bladder emptying    HPI: Patient is a 69 year old female presenting today for follow-up for incomplete bladder emptying as well as vaginal atrophy. She is using Premarin cream twice weekly. She also states that she has not experienced any urinary symptoms since last visit she has been emptying her bladder well and is not experienced any further dysuria.  She does report complaints of vaginal itching today. She states that her primary care provider has recently treated her with Diflucan as well as topical nystatin cream. She does have a history of genital herpes and has been taking her valacyclovir as directed. She states that her itching has not subsided and she feels that she might have sores inside her labia.  PMH: Past Medical History  Diagnosis Date  . Otalgia   . Internal hordeolum   . Eyelid inflammation   . Diverticulosis   . Sciatica   . Incomplete bladder emptying   . Urethral diverticulum   . Vaginal atrophy   . UTI (lower urinary tract infection)   . Dysuria   . Primary ovarian failure   . Candidiasis of vulva and vagina   . Hydronephrosis   . Herpes genitalis   . Herpes simplex   . Knee pain   . Abdominal pain   . HTN (hypertension)   . Vaginitis   . Hemorrhoids   . Cellulitis and abscess   . Shortness of breath   . Allergic rhinitis   . Hematuria syndrome   . Hypothyroidism     Surgical History: Past Surgical History  Procedure Laterality Date  . Diverticulitis    . Hernia repair    . Ectopic pregnancy surgery    . Breast biopsy    . Fx thumb      Home Medications:    Medication List       This list is accurate as of: 07/16/15  3:21 PM.  Always use your most recent med list.               ALEVE 220 MG tablet  Generic  drug:  naproxen sodium  Take by mouth.     amLODipine 2.5 MG tablet  Commonly known as:  NORVASC  Take 2.5 mg by mouth daily.     atenolol 25 MG tablet  Commonly known as:  TENORMIN  Take 25 mg by mouth 2 (two) times daily. Pt takes 1 and 1/2 tablets twice a day.     cetirizine 10 MG tablet  Commonly known as:  ZYRTEC  Take 10 mg by mouth daily.     chlorhexidine 0.12 % solution  Commonly known as:  PERIDEX  Use as directed 5 mLs in the mouth or throat 2 (two) times daily. For 2 weeks     EPIPEN 2-PAK 0.3 mg/0.3 mL Soaj injection  Generic drug:  EPINEPHrine  as needed.     fluconazole 150 MG tablet  Commonly known as:  DIFLUCAN  take 1 tablet by mouth once daily for 3 days     levothyroxine 50 MCG tablet  Commonly known as:  SYNTHROID, LEVOTHROID  Take 50 mcg by mouth daily before breakfast.     medroxyPROGESTERone 2.5 MG tablet  Commonly known as:  PROVERA  once daily.     metroNIDAZOLE 0.75 % vaginal gel  Commonly known as:  METROGEL     nystatin cream  Commonly known as:  MYCOSTATIN  Apply 1 application topically 2 (two) times daily.     PATADAY 0.2 % Soln  Generic drug:  Olopatadine HCl  Place 1 drop into both eyes 2 (two) times daily.     olopatadine 0.1 % ophthalmic solution  Commonly known as:  PATANOL  instill 1 drop into both eyes twice a day for ITCHY EYES     valACYclovir 1000 MG tablet  Commonly known as:  VALTREX  Take 1,000 mg by mouth daily. Pt can 1 tablet twice a day if flare up for 7 days.        Allergies:  Allergies  Allergen Reactions  . Ibuprofen     Other reaction(s): Unknown  . Ivp Dye [Iodinated Diagnostic Agents] Diarrhea and Hives  . Prednisone Nausea Only    Other reaction(s): Dizziness    Family History: Family History  Problem Relation Age of Onset  . Tuberculosis Mother   . Nephrolithiasis Brother     Social History:  reports that she has never smoked. She does not have any smokeless tobacco history on file. She  reports that she does not drink alcohol. Her drug history is not on file.  ROS: UROLOGY Frequent Urination?: No Hard to postpone urination?: No Burning/pain with urination?: No Get up at night to urinate?: Yes Leakage of urine?: Yes Urine stream starts and stops?: No Trouble starting stream?: No Do you have to strain to urinate?: No Blood in urine?: No Urinary tract infection?: No Sexually transmitted disease?: Yes Injury to kidneys or bladder?: No Painful intercourse?: No Weak stream?: No Currently pregnant?: No Vaginal bleeding?: No Last menstrual period?: n  Gastrointestinal Nausea?: No Vomiting?: No Indigestion/heartburn?: No Diarrhea?: No Constipation?: No  Constitutional Fever: No Night sweats?: No Weight loss?: No Fatigue?: No  Skin Skin rash/lesions?: Yes Itching?: Yes  Eyes Blurred vision?: No Double vision?: No  Ears/Nose/Throat Sore throat?: No Sinus problems?: No  Hematologic/Lymphatic Swollen glands?: No Easy bruising?: No  Cardiovascular Leg swelling?: No Chest pain?: No  Respiratory Cough?: No Shortness of breath?: Yes  Endocrine Excessive thirst?: No  Musculoskeletal Back pain?: No Joint pain?: Yes  Neurological Headaches?: No Dizziness?: No  Psychologic Depression?: No Anxiety?: No  Physical Exam: BP 138/84 mmHg  Pulse 82  Resp 16  Ht 4\' 8"  (1.422 m)  Wt 183 lb (83.008 kg)  BMI 41.05 kg/m2  Constitutional:  Alert and oriented, No acute distress. HEENT: [place] AT, moist mucus membranes.  Trachea midline, no masses. Cardiovascular: No clubbing, cyanosis, or edema. Respiratory: Normal respiratory effort, no increased work of breathing. GI: Abdomen is soft, nontender, nondistended, no abdominal masses GU: No CVA tenderness.  External pelvic exam: no vesicles or discharge noted Skin: No rashes, bruises or suspicious lesions. Lymph: No cervical or inguinal adenopathy. Neurologic: Grossly intact, no focal deficits, moving  all 4 extremities. Psychiatric: Normal mood and affect.  Laboratory Data:   Urinalysis No results found for: COLORURINE, APPEARANCEUR, LABSPEC, PHURINE, GLUCOSEU, HGBUR, BILIRUBINUR, KETONESUR, PROTEINUR, UROBILINOGEN, NITRITE, LEUKOCYTESUR  Pertinent Imaging:   Assessment & Plan:   1. Vaginal atrophy- Mild to moderate vaginal atrophy on today's exam. No signs of genital herpes outbreak though I encouraged patient to follow-up with her primary care as scheduled tomorrow. She was encouraged to use condoms with every sexual encounter. I also advised her on vaginal moisturizers such as coconut oil. - Urinalysis, Complete  2. Incomplete bladder emptying- minimal PVR today.  UA showing 6-10 WBCs, 0-2 RBCs, 0-10 epithelial cells with many bacteria. I suspect this is vaginal contamination. - BLADDER SCAN AMB NON-IMAGING   Return if symptoms worsen or fail to improve.  These notes generated with voice recognition software. I apologize for typographical errors.  Herbert Moors, [facility] [address], [place], [place] [address] [phone]

## 2015-07-17 DIAGNOSIS — Z91018 Allergy to other foods: Secondary | ICD-10-CM | POA: Diagnosis not present

## 2015-07-17 DIAGNOSIS — J301 Allergic rhinitis due to pollen: Secondary | ICD-10-CM | POA: Diagnosis not present

## 2015-07-17 DIAGNOSIS — G4733 Obstructive sleep apnea (adult) (pediatric): Secondary | ICD-10-CM | POA: Diagnosis not present

## 2015-07-19 DIAGNOSIS — J301 Allergic rhinitis due to pollen: Secondary | ICD-10-CM | POA: Diagnosis not present

## 2015-07-19 DIAGNOSIS — I10 Essential (primary) hypertension: Secondary | ICD-10-CM | POA: Diagnosis not present

## 2015-07-19 DIAGNOSIS — Z91018 Allergy to other foods: Secondary | ICD-10-CM | POA: Diagnosis not present

## 2015-07-19 DIAGNOSIS — E039 Hypothyroidism, unspecified: Secondary | ICD-10-CM | POA: Diagnosis not present

## 2015-08-01 DIAGNOSIS — G4733 Obstructive sleep apnea (adult) (pediatric): Secondary | ICD-10-CM | POA: Diagnosis not present

## 2015-08-23 DIAGNOSIS — Z0001 Encounter for general adult medical examination with abnormal findings: Secondary | ICD-10-CM | POA: Diagnosis not present

## 2015-08-23 DIAGNOSIS — B373 Candidiasis of vulva and vagina: Secondary | ICD-10-CM | POA: Diagnosis not present

## 2015-08-23 DIAGNOSIS — K1230 Oral mucositis (ulcerative), unspecified: Secondary | ICD-10-CM | POA: Diagnosis not present

## 2015-08-23 DIAGNOSIS — J301 Allergic rhinitis due to pollen: Secondary | ICD-10-CM | POA: Diagnosis not present

## 2015-08-23 DIAGNOSIS — A6004 Herpesviral vulvovaginitis: Secondary | ICD-10-CM | POA: Diagnosis not present

## 2015-08-23 DIAGNOSIS — E039 Hypothyroidism, unspecified: Secondary | ICD-10-CM | POA: Diagnosis not present

## 2015-08-24 DIAGNOSIS — K1230 Oral mucositis (ulcerative), unspecified: Secondary | ICD-10-CM | POA: Diagnosis not present

## 2015-08-24 DIAGNOSIS — B373 Candidiasis of vulva and vagina: Secondary | ICD-10-CM | POA: Diagnosis not present

## 2015-08-24 DIAGNOSIS — A6004 Herpesviral vulvovaginitis: Secondary | ICD-10-CM | POA: Diagnosis not present

## 2015-09-01 DIAGNOSIS — G4733 Obstructive sleep apnea (adult) (pediatric): Secondary | ICD-10-CM | POA: Diagnosis not present

## 2015-09-11 DIAGNOSIS — J3089 Other allergic rhinitis: Secondary | ICD-10-CM | POA: Diagnosis not present

## 2015-09-11 DIAGNOSIS — J4520 Mild intermittent asthma, uncomplicated: Secondary | ICD-10-CM | POA: Diagnosis not present

## 2015-09-11 DIAGNOSIS — H1045 Other chronic allergic conjunctivitis: Secondary | ICD-10-CM | POA: Diagnosis not present

## 2015-09-11 DIAGNOSIS — J301 Allergic rhinitis due to pollen: Secondary | ICD-10-CM | POA: Diagnosis not present

## 2015-09-19 DIAGNOSIS — G4733 Obstructive sleep apnea (adult) (pediatric): Secondary | ICD-10-CM | POA: Diagnosis not present

## 2015-09-20 DIAGNOSIS — J3089 Other allergic rhinitis: Secondary | ICD-10-CM | POA: Diagnosis not present

## 2015-09-20 DIAGNOSIS — J301 Allergic rhinitis due to pollen: Secondary | ICD-10-CM | POA: Diagnosis not present

## 2015-09-20 DIAGNOSIS — J3081 Allergic rhinitis due to animal (cat) (dog) hair and dander: Secondary | ICD-10-CM | POA: Diagnosis not present

## 2015-09-24 DIAGNOSIS — M170 Bilateral primary osteoarthritis of knee: Secondary | ICD-10-CM | POA: Diagnosis not present

## 2015-09-24 DIAGNOSIS — A6004 Herpesviral vulvovaginitis: Secondary | ICD-10-CM | POA: Diagnosis not present

## 2015-09-24 DIAGNOSIS — N390 Urinary tract infection, site not specified: Secondary | ICD-10-CM | POA: Diagnosis not present

## 2015-10-01 DIAGNOSIS — G4733 Obstructive sleep apnea (adult) (pediatric): Secondary | ICD-10-CM | POA: Diagnosis not present

## 2015-10-30 DIAGNOSIS — J3089 Other allergic rhinitis: Secondary | ICD-10-CM | POA: Diagnosis not present

## 2015-10-30 DIAGNOSIS — J3081 Allergic rhinitis due to animal (cat) (dog) hair and dander: Secondary | ICD-10-CM | POA: Diagnosis not present

## 2015-10-30 DIAGNOSIS — J301 Allergic rhinitis due to pollen: Secondary | ICD-10-CM | POA: Diagnosis not present

## 2015-11-01 DIAGNOSIS — J301 Allergic rhinitis due to pollen: Secondary | ICD-10-CM | POA: Diagnosis not present

## 2015-11-01 DIAGNOSIS — J3081 Allergic rhinitis due to animal (cat) (dog) hair and dander: Secondary | ICD-10-CM | POA: Diagnosis not present

## 2015-11-01 DIAGNOSIS — J3089 Other allergic rhinitis: Secondary | ICD-10-CM | POA: Diagnosis not present

## 2015-11-01 DIAGNOSIS — G4733 Obstructive sleep apnea (adult) (pediatric): Secondary | ICD-10-CM | POA: Diagnosis not present

## 2015-11-06 DIAGNOSIS — J301 Allergic rhinitis due to pollen: Secondary | ICD-10-CM | POA: Diagnosis not present

## 2015-11-06 DIAGNOSIS — J3081 Allergic rhinitis due to animal (cat) (dog) hair and dander: Secondary | ICD-10-CM | POA: Diagnosis not present

## 2015-11-06 DIAGNOSIS — J3089 Other allergic rhinitis: Secondary | ICD-10-CM | POA: Diagnosis not present

## 2015-11-08 DIAGNOSIS — J301 Allergic rhinitis due to pollen: Secondary | ICD-10-CM | POA: Diagnosis not present

## 2015-11-08 DIAGNOSIS — J3089 Other allergic rhinitis: Secondary | ICD-10-CM | POA: Diagnosis not present

## 2015-11-08 DIAGNOSIS — J3081 Allergic rhinitis due to animal (cat) (dog) hair and dander: Secondary | ICD-10-CM | POA: Diagnosis not present

## 2015-11-13 DIAGNOSIS — J3089 Other allergic rhinitis: Secondary | ICD-10-CM | POA: Diagnosis not present

## 2015-11-13 DIAGNOSIS — J3081 Allergic rhinitis due to animal (cat) (dog) hair and dander: Secondary | ICD-10-CM | POA: Diagnosis not present

## 2015-11-13 DIAGNOSIS — J301 Allergic rhinitis due to pollen: Secondary | ICD-10-CM | POA: Diagnosis not present

## 2015-11-14 DIAGNOSIS — J301 Allergic rhinitis due to pollen: Secondary | ICD-10-CM | POA: Diagnosis not present

## 2015-11-14 DIAGNOSIS — G4733 Obstructive sleep apnea (adult) (pediatric): Secondary | ICD-10-CM | POA: Diagnosis not present

## 2015-11-14 DIAGNOSIS — R040 Epistaxis: Secondary | ICD-10-CM | POA: Diagnosis not present

## 2015-11-15 DIAGNOSIS — J3081 Allergic rhinitis due to animal (cat) (dog) hair and dander: Secondary | ICD-10-CM | POA: Diagnosis not present

## 2015-11-15 DIAGNOSIS — J3089 Other allergic rhinitis: Secondary | ICD-10-CM | POA: Diagnosis not present

## 2015-11-15 DIAGNOSIS — J301 Allergic rhinitis due to pollen: Secondary | ICD-10-CM | POA: Diagnosis not present

## 2015-11-19 DIAGNOSIS — I10 Essential (primary) hypertension: Secondary | ICD-10-CM | POA: Diagnosis not present

## 2015-11-19 DIAGNOSIS — N390 Urinary tract infection, site not specified: Secondary | ICD-10-CM | POA: Diagnosis not present

## 2015-11-19 DIAGNOSIS — E039 Hypothyroidism, unspecified: Secondary | ICD-10-CM | POA: Diagnosis not present

## 2015-11-20 DIAGNOSIS — J301 Allergic rhinitis due to pollen: Secondary | ICD-10-CM | POA: Diagnosis not present

## 2015-11-20 DIAGNOSIS — J3081 Allergic rhinitis due to animal (cat) (dog) hair and dander: Secondary | ICD-10-CM | POA: Diagnosis not present

## 2015-11-20 DIAGNOSIS — J3089 Other allergic rhinitis: Secondary | ICD-10-CM | POA: Diagnosis not present

## 2015-11-22 DIAGNOSIS — J3081 Allergic rhinitis due to animal (cat) (dog) hair and dander: Secondary | ICD-10-CM | POA: Diagnosis not present

## 2015-11-22 DIAGNOSIS — J3089 Other allergic rhinitis: Secondary | ICD-10-CM | POA: Diagnosis not present

## 2015-11-22 DIAGNOSIS — J301 Allergic rhinitis due to pollen: Secondary | ICD-10-CM | POA: Diagnosis not present

## 2015-11-27 DIAGNOSIS — J3081 Allergic rhinitis due to animal (cat) (dog) hair and dander: Secondary | ICD-10-CM | POA: Diagnosis not present

## 2015-11-27 DIAGNOSIS — J3089 Other allergic rhinitis: Secondary | ICD-10-CM | POA: Diagnosis not present

## 2015-11-27 DIAGNOSIS — J301 Allergic rhinitis due to pollen: Secondary | ICD-10-CM | POA: Diagnosis not present

## 2015-11-29 DIAGNOSIS — J3081 Allergic rhinitis due to animal (cat) (dog) hair and dander: Secondary | ICD-10-CM | POA: Diagnosis not present

## 2015-11-29 DIAGNOSIS — J301 Allergic rhinitis due to pollen: Secondary | ICD-10-CM | POA: Diagnosis not present

## 2015-11-29 DIAGNOSIS — J3089 Other allergic rhinitis: Secondary | ICD-10-CM | POA: Diagnosis not present

## 2015-12-02 DIAGNOSIS — G4733 Obstructive sleep apnea (adult) (pediatric): Secondary | ICD-10-CM | POA: Diagnosis not present

## 2015-12-04 DIAGNOSIS — J3089 Other allergic rhinitis: Secondary | ICD-10-CM | POA: Diagnosis not present

## 2015-12-04 DIAGNOSIS — J3081 Allergic rhinitis due to animal (cat) (dog) hair and dander: Secondary | ICD-10-CM | POA: Diagnosis not present

## 2015-12-04 DIAGNOSIS — J301 Allergic rhinitis due to pollen: Secondary | ICD-10-CM | POA: Diagnosis not present

## 2015-12-06 DIAGNOSIS — J3081 Allergic rhinitis due to animal (cat) (dog) hair and dander: Secondary | ICD-10-CM | POA: Diagnosis not present

## 2015-12-06 DIAGNOSIS — J3089 Other allergic rhinitis: Secondary | ICD-10-CM | POA: Diagnosis not present

## 2015-12-06 DIAGNOSIS — J301 Allergic rhinitis due to pollen: Secondary | ICD-10-CM | POA: Diagnosis not present

## 2015-12-11 DIAGNOSIS — J3089 Other allergic rhinitis: Secondary | ICD-10-CM | POA: Diagnosis not present

## 2015-12-11 DIAGNOSIS — J301 Allergic rhinitis due to pollen: Secondary | ICD-10-CM | POA: Diagnosis not present

## 2015-12-11 DIAGNOSIS — J3081 Allergic rhinitis due to animal (cat) (dog) hair and dander: Secondary | ICD-10-CM | POA: Diagnosis not present

## 2015-12-13 DIAGNOSIS — J3081 Allergic rhinitis due to animal (cat) (dog) hair and dander: Secondary | ICD-10-CM | POA: Diagnosis not present

## 2015-12-13 DIAGNOSIS — J301 Allergic rhinitis due to pollen: Secondary | ICD-10-CM | POA: Diagnosis not present

## 2015-12-13 DIAGNOSIS — J3089 Other allergic rhinitis: Secondary | ICD-10-CM | POA: Diagnosis not present

## 2015-12-18 DIAGNOSIS — J301 Allergic rhinitis due to pollen: Secondary | ICD-10-CM | POA: Diagnosis not present

## 2015-12-18 DIAGNOSIS — J3089 Other allergic rhinitis: Secondary | ICD-10-CM | POA: Diagnosis not present

## 2015-12-18 DIAGNOSIS — J3081 Allergic rhinitis due to animal (cat) (dog) hair and dander: Secondary | ICD-10-CM | POA: Diagnosis not present

## 2015-12-20 DIAGNOSIS — J3081 Allergic rhinitis due to animal (cat) (dog) hair and dander: Secondary | ICD-10-CM | POA: Diagnosis not present

## 2015-12-20 DIAGNOSIS — J301 Allergic rhinitis due to pollen: Secondary | ICD-10-CM | POA: Diagnosis not present

## 2015-12-20 DIAGNOSIS — J3089 Other allergic rhinitis: Secondary | ICD-10-CM | POA: Diagnosis not present

## 2015-12-25 DIAGNOSIS — J301 Allergic rhinitis due to pollen: Secondary | ICD-10-CM | POA: Diagnosis not present

## 2015-12-25 DIAGNOSIS — J3089 Other allergic rhinitis: Secondary | ICD-10-CM | POA: Diagnosis not present

## 2015-12-25 DIAGNOSIS — J3081 Allergic rhinitis due to animal (cat) (dog) hair and dander: Secondary | ICD-10-CM | POA: Diagnosis not present

## 2015-12-27 DIAGNOSIS — J3089 Other allergic rhinitis: Secondary | ICD-10-CM | POA: Diagnosis not present

## 2015-12-27 DIAGNOSIS — I10 Essential (primary) hypertension: Secondary | ICD-10-CM | POA: Diagnosis not present

## 2015-12-27 DIAGNOSIS — J301 Allergic rhinitis due to pollen: Secondary | ICD-10-CM | POA: Diagnosis not present

## 2015-12-27 DIAGNOSIS — J3081 Allergic rhinitis due to animal (cat) (dog) hair and dander: Secondary | ICD-10-CM | POA: Diagnosis not present

## 2015-12-27 DIAGNOSIS — N390 Urinary tract infection, site not specified: Secondary | ICD-10-CM | POA: Diagnosis not present

## 2015-12-30 DIAGNOSIS — G4733 Obstructive sleep apnea (adult) (pediatric): Secondary | ICD-10-CM | POA: Diagnosis not present

## 2016-01-01 DIAGNOSIS — J3089 Other allergic rhinitis: Secondary | ICD-10-CM | POA: Diagnosis not present

## 2016-01-01 DIAGNOSIS — J301 Allergic rhinitis due to pollen: Secondary | ICD-10-CM | POA: Diagnosis not present

## 2016-01-01 DIAGNOSIS — Z1231 Encounter for screening mammogram for malignant neoplasm of breast: Secondary | ICD-10-CM | POA: Diagnosis not present

## 2016-01-01 DIAGNOSIS — R921 Mammographic calcification found on diagnostic imaging of breast: Secondary | ICD-10-CM | POA: Diagnosis not present

## 2016-01-01 DIAGNOSIS — J3081 Allergic rhinitis due to animal (cat) (dog) hair and dander: Secondary | ICD-10-CM | POA: Diagnosis not present

## 2016-01-03 DIAGNOSIS — J3081 Allergic rhinitis due to animal (cat) (dog) hair and dander: Secondary | ICD-10-CM | POA: Diagnosis not present

## 2016-01-03 DIAGNOSIS — J3089 Other allergic rhinitis: Secondary | ICD-10-CM | POA: Diagnosis not present

## 2016-01-03 DIAGNOSIS — J301 Allergic rhinitis due to pollen: Secondary | ICD-10-CM | POA: Diagnosis not present

## 2016-01-08 DIAGNOSIS — J3089 Other allergic rhinitis: Secondary | ICD-10-CM | POA: Diagnosis not present

## 2016-01-08 DIAGNOSIS — J301 Allergic rhinitis due to pollen: Secondary | ICD-10-CM | POA: Diagnosis not present

## 2016-01-08 DIAGNOSIS — J3081 Allergic rhinitis due to animal (cat) (dog) hair and dander: Secondary | ICD-10-CM | POA: Diagnosis not present

## 2016-01-10 DIAGNOSIS — J3089 Other allergic rhinitis: Secondary | ICD-10-CM | POA: Diagnosis not present

## 2016-01-10 DIAGNOSIS — J301 Allergic rhinitis due to pollen: Secondary | ICD-10-CM | POA: Diagnosis not present

## 2016-01-10 DIAGNOSIS — J3081 Allergic rhinitis due to animal (cat) (dog) hair and dander: Secondary | ICD-10-CM | POA: Diagnosis not present

## 2016-01-11 DIAGNOSIS — J301 Allergic rhinitis due to pollen: Secondary | ICD-10-CM | POA: Diagnosis not present

## 2016-01-11 DIAGNOSIS — J3081 Allergic rhinitis due to animal (cat) (dog) hair and dander: Secondary | ICD-10-CM | POA: Diagnosis not present

## 2016-01-11 DIAGNOSIS — J3089 Other allergic rhinitis: Secondary | ICD-10-CM | POA: Diagnosis not present

## 2016-01-15 DIAGNOSIS — J3081 Allergic rhinitis due to animal (cat) (dog) hair and dander: Secondary | ICD-10-CM | POA: Diagnosis not present

## 2016-01-15 DIAGNOSIS — J3089 Other allergic rhinitis: Secondary | ICD-10-CM | POA: Diagnosis not present

## 2016-01-15 DIAGNOSIS — J301 Allergic rhinitis due to pollen: Secondary | ICD-10-CM | POA: Diagnosis not present

## 2016-01-22 DIAGNOSIS — J3081 Allergic rhinitis due to animal (cat) (dog) hair and dander: Secondary | ICD-10-CM | POA: Diagnosis not present

## 2016-01-22 DIAGNOSIS — J3089 Other allergic rhinitis: Secondary | ICD-10-CM | POA: Diagnosis not present

## 2016-01-22 DIAGNOSIS — J301 Allergic rhinitis due to pollen: Secondary | ICD-10-CM | POA: Diagnosis not present

## 2016-01-29 DIAGNOSIS — J3089 Other allergic rhinitis: Secondary | ICD-10-CM | POA: Diagnosis not present

## 2016-01-29 DIAGNOSIS — J301 Allergic rhinitis due to pollen: Secondary | ICD-10-CM | POA: Diagnosis not present

## 2016-01-29 DIAGNOSIS — J3081 Allergic rhinitis due to animal (cat) (dog) hair and dander: Secondary | ICD-10-CM | POA: Diagnosis not present

## 2016-01-30 DIAGNOSIS — G4733 Obstructive sleep apnea (adult) (pediatric): Secondary | ICD-10-CM | POA: Diagnosis not present

## 2016-02-05 DIAGNOSIS — J301 Allergic rhinitis due to pollen: Secondary | ICD-10-CM | POA: Diagnosis not present

## 2016-02-05 DIAGNOSIS — J3089 Other allergic rhinitis: Secondary | ICD-10-CM | POA: Diagnosis not present

## 2016-02-05 DIAGNOSIS — J3081 Allergic rhinitis due to animal (cat) (dog) hair and dander: Secondary | ICD-10-CM | POA: Diagnosis not present

## 2016-02-12 DIAGNOSIS — J3081 Allergic rhinitis due to animal (cat) (dog) hair and dander: Secondary | ICD-10-CM | POA: Diagnosis not present

## 2016-02-12 DIAGNOSIS — J301 Allergic rhinitis due to pollen: Secondary | ICD-10-CM | POA: Diagnosis not present

## 2016-02-12 DIAGNOSIS — J3089 Other allergic rhinitis: Secondary | ICD-10-CM | POA: Diagnosis not present

## 2016-02-19 DIAGNOSIS — J3089 Other allergic rhinitis: Secondary | ICD-10-CM | POA: Diagnosis not present

## 2016-02-19 DIAGNOSIS — J3081 Allergic rhinitis due to animal (cat) (dog) hair and dander: Secondary | ICD-10-CM | POA: Diagnosis not present

## 2016-02-19 DIAGNOSIS — J301 Allergic rhinitis due to pollen: Secondary | ICD-10-CM | POA: Diagnosis not present

## 2016-02-21 DIAGNOSIS — G4733 Obstructive sleep apnea (adult) (pediatric): Secondary | ICD-10-CM | POA: Diagnosis not present

## 2016-02-26 DIAGNOSIS — J3081 Allergic rhinitis due to animal (cat) (dog) hair and dander: Secondary | ICD-10-CM | POA: Diagnosis not present

## 2016-02-26 DIAGNOSIS — J301 Allergic rhinitis due to pollen: Secondary | ICD-10-CM | POA: Diagnosis not present

## 2016-02-26 DIAGNOSIS — J3089 Other allergic rhinitis: Secondary | ICD-10-CM | POA: Diagnosis not present

## 2016-02-28 DIAGNOSIS — J3081 Allergic rhinitis due to animal (cat) (dog) hair and dander: Secondary | ICD-10-CM | POA: Diagnosis not present

## 2016-02-28 DIAGNOSIS — J301 Allergic rhinitis due to pollen: Secondary | ICD-10-CM | POA: Diagnosis not present

## 2016-02-28 DIAGNOSIS — J3089 Other allergic rhinitis: Secondary | ICD-10-CM | POA: Diagnosis not present

## 2016-02-29 DIAGNOSIS — G4733 Obstructive sleep apnea (adult) (pediatric): Secondary | ICD-10-CM | POA: Diagnosis not present

## 2016-03-04 DIAGNOSIS — J301 Allergic rhinitis due to pollen: Secondary | ICD-10-CM | POA: Diagnosis not present

## 2016-03-04 DIAGNOSIS — J3081 Allergic rhinitis due to animal (cat) (dog) hair and dander: Secondary | ICD-10-CM | POA: Diagnosis not present

## 2016-03-04 DIAGNOSIS — J3089 Other allergic rhinitis: Secondary | ICD-10-CM | POA: Diagnosis not present

## 2016-03-06 DIAGNOSIS — J3081 Allergic rhinitis due to animal (cat) (dog) hair and dander: Secondary | ICD-10-CM | POA: Diagnosis not present

## 2016-03-06 DIAGNOSIS — J3089 Other allergic rhinitis: Secondary | ICD-10-CM | POA: Diagnosis not present

## 2016-03-06 DIAGNOSIS — J301 Allergic rhinitis due to pollen: Secondary | ICD-10-CM | POA: Diagnosis not present

## 2016-03-11 DIAGNOSIS — J3089 Other allergic rhinitis: Secondary | ICD-10-CM | POA: Diagnosis not present

## 2016-03-11 DIAGNOSIS — J3081 Allergic rhinitis due to animal (cat) (dog) hair and dander: Secondary | ICD-10-CM | POA: Diagnosis not present

## 2016-03-11 DIAGNOSIS — J301 Allergic rhinitis due to pollen: Secondary | ICD-10-CM | POA: Diagnosis not present

## 2016-03-13 DIAGNOSIS — J4520 Mild intermittent asthma, uncomplicated: Secondary | ICD-10-CM | POA: Diagnosis not present

## 2016-03-13 DIAGNOSIS — J301 Allergic rhinitis due to pollen: Secondary | ICD-10-CM | POA: Diagnosis not present

## 2016-03-13 DIAGNOSIS — J3089 Other allergic rhinitis: Secondary | ICD-10-CM | POA: Diagnosis not present

## 2016-03-13 DIAGNOSIS — H1045 Other chronic allergic conjunctivitis: Secondary | ICD-10-CM | POA: Diagnosis not present

## 2016-03-18 DIAGNOSIS — J301 Allergic rhinitis due to pollen: Secondary | ICD-10-CM | POA: Diagnosis not present

## 2016-03-18 DIAGNOSIS — J3081 Allergic rhinitis due to animal (cat) (dog) hair and dander: Secondary | ICD-10-CM | POA: Diagnosis not present

## 2016-03-18 DIAGNOSIS — J3089 Other allergic rhinitis: Secondary | ICD-10-CM | POA: Diagnosis not present

## 2016-03-25 DIAGNOSIS — J301 Allergic rhinitis due to pollen: Secondary | ICD-10-CM | POA: Diagnosis not present

## 2016-03-25 DIAGNOSIS — J3081 Allergic rhinitis due to animal (cat) (dog) hair and dander: Secondary | ICD-10-CM | POA: Diagnosis not present

## 2016-03-25 DIAGNOSIS — J3089 Other allergic rhinitis: Secondary | ICD-10-CM | POA: Diagnosis not present

## 2016-03-31 DIAGNOSIS — G4733 Obstructive sleep apnea (adult) (pediatric): Secondary | ICD-10-CM | POA: Diagnosis not present

## 2016-04-01 DIAGNOSIS — J3081 Allergic rhinitis due to animal (cat) (dog) hair and dander: Secondary | ICD-10-CM | POA: Diagnosis not present

## 2016-04-01 DIAGNOSIS — J301 Allergic rhinitis due to pollen: Secondary | ICD-10-CM | POA: Diagnosis not present

## 2016-04-01 DIAGNOSIS — J3089 Other allergic rhinitis: Secondary | ICD-10-CM | POA: Diagnosis not present

## 2016-04-10 DIAGNOSIS — J3089 Other allergic rhinitis: Secondary | ICD-10-CM | POA: Diagnosis not present

## 2016-04-10 DIAGNOSIS — J3081 Allergic rhinitis due to animal (cat) (dog) hair and dander: Secondary | ICD-10-CM | POA: Diagnosis not present

## 2016-04-10 DIAGNOSIS — J301 Allergic rhinitis due to pollen: Secondary | ICD-10-CM | POA: Diagnosis not present

## 2016-04-15 DIAGNOSIS — J3081 Allergic rhinitis due to animal (cat) (dog) hair and dander: Secondary | ICD-10-CM | POA: Diagnosis not present

## 2016-04-15 DIAGNOSIS — J301 Allergic rhinitis due to pollen: Secondary | ICD-10-CM | POA: Diagnosis not present

## 2016-04-15 DIAGNOSIS — J3089 Other allergic rhinitis: Secondary | ICD-10-CM | POA: Diagnosis not present

## 2016-04-22 DIAGNOSIS — J3089 Other allergic rhinitis: Secondary | ICD-10-CM | POA: Diagnosis not present

## 2016-04-22 DIAGNOSIS — J301 Allergic rhinitis due to pollen: Secondary | ICD-10-CM | POA: Diagnosis not present

## 2016-04-22 DIAGNOSIS — J3081 Allergic rhinitis due to animal (cat) (dog) hair and dander: Secondary | ICD-10-CM | POA: Diagnosis not present

## 2016-04-28 DIAGNOSIS — J301 Allergic rhinitis due to pollen: Secondary | ICD-10-CM | POA: Diagnosis not present

## 2016-04-28 DIAGNOSIS — M170 Bilateral primary osteoarthritis of knee: Secondary | ICD-10-CM | POA: Diagnosis not present

## 2016-04-28 DIAGNOSIS — I10 Essential (primary) hypertension: Secondary | ICD-10-CM | POA: Diagnosis not present

## 2016-04-28 DIAGNOSIS — A6004 Herpesviral vulvovaginitis: Secondary | ICD-10-CM | POA: Diagnosis not present

## 2016-04-28 DIAGNOSIS — K439 Ventral hernia without obstruction or gangrene: Secondary | ICD-10-CM | POA: Diagnosis not present

## 2016-04-28 DIAGNOSIS — Z0001 Encounter for general adult medical examination with abnormal findings: Secondary | ICD-10-CM | POA: Diagnosis not present

## 2016-04-29 DIAGNOSIS — J3089 Other allergic rhinitis: Secondary | ICD-10-CM | POA: Diagnosis not present

## 2016-04-29 DIAGNOSIS — Z0001 Encounter for general adult medical examination with abnormal findings: Secondary | ICD-10-CM | POA: Diagnosis not present

## 2016-04-29 DIAGNOSIS — J3081 Allergic rhinitis due to animal (cat) (dog) hair and dander: Secondary | ICD-10-CM | POA: Diagnosis not present

## 2016-04-29 DIAGNOSIS — I10 Essential (primary) hypertension: Secondary | ICD-10-CM | POA: Diagnosis not present

## 2016-04-29 DIAGNOSIS — E559 Vitamin D deficiency, unspecified: Secondary | ICD-10-CM | POA: Diagnosis not present

## 2016-04-29 DIAGNOSIS — J301 Allergic rhinitis due to pollen: Secondary | ICD-10-CM | POA: Diagnosis not present

## 2016-04-29 DIAGNOSIS — E039 Hypothyroidism, unspecified: Secondary | ICD-10-CM | POA: Diagnosis not present

## 2016-04-30 DIAGNOSIS — G4733 Obstructive sleep apnea (adult) (pediatric): Secondary | ICD-10-CM | POA: Diagnosis not present

## 2016-05-06 DIAGNOSIS — J301 Allergic rhinitis due to pollen: Secondary | ICD-10-CM | POA: Diagnosis not present

## 2016-05-06 DIAGNOSIS — J3081 Allergic rhinitis due to animal (cat) (dog) hair and dander: Secondary | ICD-10-CM | POA: Diagnosis not present

## 2016-05-06 DIAGNOSIS — J3089 Other allergic rhinitis: Secondary | ICD-10-CM | POA: Diagnosis not present

## 2016-05-12 DIAGNOSIS — G4733 Obstructive sleep apnea (adult) (pediatric): Secondary | ICD-10-CM | POA: Diagnosis not present

## 2016-05-12 DIAGNOSIS — J301 Allergic rhinitis due to pollen: Secondary | ICD-10-CM | POA: Diagnosis not present

## 2016-05-13 DIAGNOSIS — J301 Allergic rhinitis due to pollen: Secondary | ICD-10-CM | POA: Diagnosis not present

## 2016-05-13 DIAGNOSIS — J3081 Allergic rhinitis due to animal (cat) (dog) hair and dander: Secondary | ICD-10-CM | POA: Diagnosis not present

## 2016-05-13 DIAGNOSIS — J3089 Other allergic rhinitis: Secondary | ICD-10-CM | POA: Diagnosis not present

## 2016-05-20 DIAGNOSIS — J3081 Allergic rhinitis due to animal (cat) (dog) hair and dander: Secondary | ICD-10-CM | POA: Diagnosis not present

## 2016-05-20 DIAGNOSIS — J301 Allergic rhinitis due to pollen: Secondary | ICD-10-CM | POA: Diagnosis not present

## 2016-05-20 DIAGNOSIS — J3089 Other allergic rhinitis: Secondary | ICD-10-CM | POA: Diagnosis not present

## 2016-05-21 DIAGNOSIS — G4733 Obstructive sleep apnea (adult) (pediatric): Secondary | ICD-10-CM | POA: Diagnosis not present

## 2016-05-27 DIAGNOSIS — J3089 Other allergic rhinitis: Secondary | ICD-10-CM | POA: Diagnosis not present

## 2016-05-27 DIAGNOSIS — J3081 Allergic rhinitis due to animal (cat) (dog) hair and dander: Secondary | ICD-10-CM | POA: Diagnosis not present

## 2016-05-27 DIAGNOSIS — J301 Allergic rhinitis due to pollen: Secondary | ICD-10-CM | POA: Diagnosis not present

## 2016-06-03 DIAGNOSIS — J3081 Allergic rhinitis due to animal (cat) (dog) hair and dander: Secondary | ICD-10-CM | POA: Diagnosis not present

## 2016-06-03 DIAGNOSIS — J301 Allergic rhinitis due to pollen: Secondary | ICD-10-CM | POA: Diagnosis not present

## 2016-06-03 DIAGNOSIS — J3089 Other allergic rhinitis: Secondary | ICD-10-CM | POA: Diagnosis not present

## 2016-06-04 DIAGNOSIS — G4733 Obstructive sleep apnea (adult) (pediatric): Secondary | ICD-10-CM | POA: Diagnosis not present

## 2016-06-10 DIAGNOSIS — J3081 Allergic rhinitis due to animal (cat) (dog) hair and dander: Secondary | ICD-10-CM | POA: Diagnosis not present

## 2016-06-10 DIAGNOSIS — J3089 Other allergic rhinitis: Secondary | ICD-10-CM | POA: Diagnosis not present

## 2016-06-10 DIAGNOSIS — J301 Allergic rhinitis due to pollen: Secondary | ICD-10-CM | POA: Diagnosis not present

## 2016-06-17 DIAGNOSIS — J301 Allergic rhinitis due to pollen: Secondary | ICD-10-CM | POA: Diagnosis not present

## 2016-06-17 DIAGNOSIS — J3089 Other allergic rhinitis: Secondary | ICD-10-CM | POA: Diagnosis not present

## 2016-06-17 DIAGNOSIS — J3081 Allergic rhinitis due to animal (cat) (dog) hair and dander: Secondary | ICD-10-CM | POA: Diagnosis not present

## 2016-06-24 DIAGNOSIS — J301 Allergic rhinitis due to pollen: Secondary | ICD-10-CM | POA: Diagnosis not present

## 2016-06-24 DIAGNOSIS — J3081 Allergic rhinitis due to animal (cat) (dog) hair and dander: Secondary | ICD-10-CM | POA: Diagnosis not present

## 2016-06-24 DIAGNOSIS — J3089 Other allergic rhinitis: Secondary | ICD-10-CM | POA: Diagnosis not present

## 2016-07-01 DIAGNOSIS — J3089 Other allergic rhinitis: Secondary | ICD-10-CM | POA: Diagnosis not present

## 2016-07-01 DIAGNOSIS — J3081 Allergic rhinitis due to animal (cat) (dog) hair and dander: Secondary | ICD-10-CM | POA: Diagnosis not present

## 2016-07-01 DIAGNOSIS — J301 Allergic rhinitis due to pollen: Secondary | ICD-10-CM | POA: Diagnosis not present

## 2016-07-03 DIAGNOSIS — J3089 Other allergic rhinitis: Secondary | ICD-10-CM | POA: Diagnosis not present

## 2016-07-03 DIAGNOSIS — J3081 Allergic rhinitis due to animal (cat) (dog) hair and dander: Secondary | ICD-10-CM | POA: Diagnosis not present

## 2016-07-03 DIAGNOSIS — J301 Allergic rhinitis due to pollen: Secondary | ICD-10-CM | POA: Diagnosis not present

## 2016-07-08 DIAGNOSIS — J3089 Other allergic rhinitis: Secondary | ICD-10-CM | POA: Diagnosis not present

## 2016-07-08 DIAGNOSIS — J3081 Allergic rhinitis due to animal (cat) (dog) hair and dander: Secondary | ICD-10-CM | POA: Diagnosis not present

## 2016-07-08 DIAGNOSIS — J301 Allergic rhinitis due to pollen: Secondary | ICD-10-CM | POA: Diagnosis not present

## 2016-07-15 DIAGNOSIS — J301 Allergic rhinitis due to pollen: Secondary | ICD-10-CM | POA: Diagnosis not present

## 2016-07-15 DIAGNOSIS — J3089 Other allergic rhinitis: Secondary | ICD-10-CM | POA: Diagnosis not present

## 2016-07-15 DIAGNOSIS — J3081 Allergic rhinitis due to animal (cat) (dog) hair and dander: Secondary | ICD-10-CM | POA: Diagnosis not present

## 2016-07-17 DIAGNOSIS — J3081 Allergic rhinitis due to animal (cat) (dog) hair and dander: Secondary | ICD-10-CM | POA: Diagnosis not present

## 2016-07-17 DIAGNOSIS — J301 Allergic rhinitis due to pollen: Secondary | ICD-10-CM | POA: Diagnosis not present

## 2016-07-17 DIAGNOSIS — J3089 Other allergic rhinitis: Secondary | ICD-10-CM | POA: Diagnosis not present

## 2016-07-22 DIAGNOSIS — J3081 Allergic rhinitis due to animal (cat) (dog) hair and dander: Secondary | ICD-10-CM | POA: Diagnosis not present

## 2016-07-22 DIAGNOSIS — J301 Allergic rhinitis due to pollen: Secondary | ICD-10-CM | POA: Diagnosis not present

## 2016-07-22 DIAGNOSIS — J3089 Other allergic rhinitis: Secondary | ICD-10-CM | POA: Diagnosis not present

## 2016-07-24 DIAGNOSIS — J301 Allergic rhinitis due to pollen: Secondary | ICD-10-CM | POA: Diagnosis not present

## 2016-07-24 DIAGNOSIS — J3081 Allergic rhinitis due to animal (cat) (dog) hair and dander: Secondary | ICD-10-CM | POA: Diagnosis not present

## 2016-07-24 DIAGNOSIS — J3089 Other allergic rhinitis: Secondary | ICD-10-CM | POA: Diagnosis not present

## 2016-07-29 DIAGNOSIS — J3089 Other allergic rhinitis: Secondary | ICD-10-CM | POA: Diagnosis not present

## 2016-07-29 DIAGNOSIS — J301 Allergic rhinitis due to pollen: Secondary | ICD-10-CM | POA: Diagnosis not present

## 2016-07-29 DIAGNOSIS — J3081 Allergic rhinitis due to animal (cat) (dog) hair and dander: Secondary | ICD-10-CM | POA: Diagnosis not present

## 2016-08-05 DIAGNOSIS — J301 Allergic rhinitis due to pollen: Secondary | ICD-10-CM | POA: Diagnosis not present

## 2016-08-05 DIAGNOSIS — J3089 Other allergic rhinitis: Secondary | ICD-10-CM | POA: Diagnosis not present

## 2016-08-05 DIAGNOSIS — J3081 Allergic rhinitis due to animal (cat) (dog) hair and dander: Secondary | ICD-10-CM | POA: Diagnosis not present

## 2016-08-12 DIAGNOSIS — J3089 Other allergic rhinitis: Secondary | ICD-10-CM | POA: Diagnosis not present

## 2016-08-12 DIAGNOSIS — J3081 Allergic rhinitis due to animal (cat) (dog) hair and dander: Secondary | ICD-10-CM | POA: Diagnosis not present

## 2016-08-12 DIAGNOSIS — J301 Allergic rhinitis due to pollen: Secondary | ICD-10-CM | POA: Diagnosis not present

## 2016-08-19 DIAGNOSIS — J3081 Allergic rhinitis due to animal (cat) (dog) hair and dander: Secondary | ICD-10-CM | POA: Diagnosis not present

## 2016-08-19 DIAGNOSIS — J3089 Other allergic rhinitis: Secondary | ICD-10-CM | POA: Diagnosis not present

## 2016-08-19 DIAGNOSIS — J301 Allergic rhinitis due to pollen: Secondary | ICD-10-CM | POA: Diagnosis not present

## 2016-08-26 DIAGNOSIS — J3089 Other allergic rhinitis: Secondary | ICD-10-CM | POA: Diagnosis not present

## 2016-08-26 DIAGNOSIS — J3081 Allergic rhinitis due to animal (cat) (dog) hair and dander: Secondary | ICD-10-CM | POA: Diagnosis not present

## 2016-08-26 DIAGNOSIS — J301 Allergic rhinitis due to pollen: Secondary | ICD-10-CM | POA: Diagnosis not present

## 2016-09-02 DIAGNOSIS — J3089 Other allergic rhinitis: Secondary | ICD-10-CM | POA: Diagnosis not present

## 2016-09-02 DIAGNOSIS — J301 Allergic rhinitis due to pollen: Secondary | ICD-10-CM | POA: Diagnosis not present

## 2016-09-02 DIAGNOSIS — E039 Hypothyroidism, unspecified: Secondary | ICD-10-CM | POA: Diagnosis not present

## 2016-09-02 DIAGNOSIS — M170 Bilateral primary osteoarthritis of knee: Secondary | ICD-10-CM | POA: Diagnosis not present

## 2016-09-02 DIAGNOSIS — J3081 Allergic rhinitis due to animal (cat) (dog) hair and dander: Secondary | ICD-10-CM | POA: Diagnosis not present

## 2016-09-02 DIAGNOSIS — I10 Essential (primary) hypertension: Secondary | ICD-10-CM | POA: Diagnosis not present

## 2016-09-09 DIAGNOSIS — J301 Allergic rhinitis due to pollen: Secondary | ICD-10-CM | POA: Diagnosis not present

## 2016-09-09 DIAGNOSIS — J3089 Other allergic rhinitis: Secondary | ICD-10-CM | POA: Diagnosis not present

## 2016-09-09 DIAGNOSIS — J3081 Allergic rhinitis due to animal (cat) (dog) hair and dander: Secondary | ICD-10-CM | POA: Diagnosis not present

## 2016-09-16 DIAGNOSIS — J301 Allergic rhinitis due to pollen: Secondary | ICD-10-CM | POA: Diagnosis not present

## 2016-09-16 DIAGNOSIS — J3081 Allergic rhinitis due to animal (cat) (dog) hair and dander: Secondary | ICD-10-CM | POA: Diagnosis not present

## 2016-09-16 DIAGNOSIS — J3089 Other allergic rhinitis: Secondary | ICD-10-CM | POA: Diagnosis not present

## 2016-09-23 DIAGNOSIS — J3089 Other allergic rhinitis: Secondary | ICD-10-CM | POA: Diagnosis not present

## 2016-09-23 DIAGNOSIS — J301 Allergic rhinitis due to pollen: Secondary | ICD-10-CM | POA: Diagnosis not present

## 2016-09-23 DIAGNOSIS — J3081 Allergic rhinitis due to animal (cat) (dog) hair and dander: Secondary | ICD-10-CM | POA: Diagnosis not present

## 2016-09-30 DIAGNOSIS — J3089 Other allergic rhinitis: Secondary | ICD-10-CM | POA: Diagnosis not present

## 2016-09-30 DIAGNOSIS — J301 Allergic rhinitis due to pollen: Secondary | ICD-10-CM | POA: Diagnosis not present

## 2016-09-30 DIAGNOSIS — J3081 Allergic rhinitis due to animal (cat) (dog) hair and dander: Secondary | ICD-10-CM | POA: Diagnosis not present

## 2016-10-07 DIAGNOSIS — J3089 Other allergic rhinitis: Secondary | ICD-10-CM | POA: Diagnosis not present

## 2016-10-07 DIAGNOSIS — J3081 Allergic rhinitis due to animal (cat) (dog) hair and dander: Secondary | ICD-10-CM | POA: Diagnosis not present

## 2016-10-07 DIAGNOSIS — J301 Allergic rhinitis due to pollen: Secondary | ICD-10-CM | POA: Diagnosis not present

## 2016-10-14 DIAGNOSIS — J3081 Allergic rhinitis due to animal (cat) (dog) hair and dander: Secondary | ICD-10-CM | POA: Diagnosis not present

## 2016-10-14 DIAGNOSIS — J301 Allergic rhinitis due to pollen: Secondary | ICD-10-CM | POA: Diagnosis not present

## 2016-10-14 DIAGNOSIS — J3089 Other allergic rhinitis: Secondary | ICD-10-CM | POA: Diagnosis not present

## 2016-10-21 DIAGNOSIS — J301 Allergic rhinitis due to pollen: Secondary | ICD-10-CM | POA: Diagnosis not present

## 2016-10-21 DIAGNOSIS — J3089 Other allergic rhinitis: Secondary | ICD-10-CM | POA: Diagnosis not present

## 2016-10-21 DIAGNOSIS — J3081 Allergic rhinitis due to animal (cat) (dog) hair and dander: Secondary | ICD-10-CM | POA: Diagnosis not present

## 2016-10-28 DIAGNOSIS — J301 Allergic rhinitis due to pollen: Secondary | ICD-10-CM | POA: Diagnosis not present

## 2016-10-28 DIAGNOSIS — J3081 Allergic rhinitis due to animal (cat) (dog) hair and dander: Secondary | ICD-10-CM | POA: Diagnosis not present

## 2016-10-28 DIAGNOSIS — J3089 Other allergic rhinitis: Secondary | ICD-10-CM | POA: Diagnosis not present

## 2016-11-04 DIAGNOSIS — J3081 Allergic rhinitis due to animal (cat) (dog) hair and dander: Secondary | ICD-10-CM | POA: Diagnosis not present

## 2016-11-04 DIAGNOSIS — J301 Allergic rhinitis due to pollen: Secondary | ICD-10-CM | POA: Diagnosis not present

## 2016-11-04 DIAGNOSIS — J3089 Other allergic rhinitis: Secondary | ICD-10-CM | POA: Diagnosis not present

## 2016-11-06 DIAGNOSIS — H1045 Other chronic allergic conjunctivitis: Secondary | ICD-10-CM | POA: Diagnosis not present

## 2016-11-06 DIAGNOSIS — J301 Allergic rhinitis due to pollen: Secondary | ICD-10-CM | POA: Diagnosis not present

## 2016-11-06 DIAGNOSIS — J3089 Other allergic rhinitis: Secondary | ICD-10-CM | POA: Diagnosis not present

## 2016-11-06 DIAGNOSIS — J4520 Mild intermittent asthma, uncomplicated: Secondary | ICD-10-CM | POA: Diagnosis not present

## 2016-11-11 DIAGNOSIS — J3081 Allergic rhinitis due to animal (cat) (dog) hair and dander: Secondary | ICD-10-CM | POA: Diagnosis not present

## 2016-11-11 DIAGNOSIS — J301 Allergic rhinitis due to pollen: Secondary | ICD-10-CM | POA: Diagnosis not present

## 2016-11-11 DIAGNOSIS — J3089 Other allergic rhinitis: Secondary | ICD-10-CM | POA: Diagnosis not present

## 2016-11-11 DIAGNOSIS — T781XXA Other adverse food reactions, not elsewhere classified, initial encounter: Secondary | ICD-10-CM | POA: Diagnosis not present

## 2016-11-17 DIAGNOSIS — R0602 Shortness of breath: Secondary | ICD-10-CM | POA: Diagnosis not present

## 2016-11-17 DIAGNOSIS — G4733 Obstructive sleep apnea (adult) (pediatric): Secondary | ICD-10-CM | POA: Diagnosis not present

## 2016-11-17 DIAGNOSIS — J301 Allergic rhinitis due to pollen: Secondary | ICD-10-CM | POA: Diagnosis not present

## 2016-11-18 DIAGNOSIS — J3089 Other allergic rhinitis: Secondary | ICD-10-CM | POA: Diagnosis not present

## 2016-11-18 DIAGNOSIS — J301 Allergic rhinitis due to pollen: Secondary | ICD-10-CM | POA: Diagnosis not present

## 2016-11-25 DIAGNOSIS — J3081 Allergic rhinitis due to animal (cat) (dog) hair and dander: Secondary | ICD-10-CM | POA: Diagnosis not present

## 2016-11-25 DIAGNOSIS — J3089 Other allergic rhinitis: Secondary | ICD-10-CM | POA: Diagnosis not present

## 2016-11-25 DIAGNOSIS — J301 Allergic rhinitis due to pollen: Secondary | ICD-10-CM | POA: Diagnosis not present

## 2016-11-27 DIAGNOSIS — J301 Allergic rhinitis due to pollen: Secondary | ICD-10-CM | POA: Diagnosis not present

## 2016-11-28 DIAGNOSIS — J3089 Other allergic rhinitis: Secondary | ICD-10-CM | POA: Diagnosis not present

## 2016-11-28 DIAGNOSIS — G4733 Obstructive sleep apnea (adult) (pediatric): Secondary | ICD-10-CM | POA: Diagnosis not present

## 2016-11-28 DIAGNOSIS — J3081 Allergic rhinitis due to animal (cat) (dog) hair and dander: Secondary | ICD-10-CM | POA: Diagnosis not present

## 2016-12-02 DIAGNOSIS — J301 Allergic rhinitis due to pollen: Secondary | ICD-10-CM | POA: Diagnosis not present

## 2016-12-02 DIAGNOSIS — J3081 Allergic rhinitis due to animal (cat) (dog) hair and dander: Secondary | ICD-10-CM | POA: Diagnosis not present

## 2016-12-02 DIAGNOSIS — J3089 Other allergic rhinitis: Secondary | ICD-10-CM | POA: Diagnosis not present

## 2016-12-03 DIAGNOSIS — G4733 Obstructive sleep apnea (adult) (pediatric): Secondary | ICD-10-CM | POA: Diagnosis not present

## 2016-12-09 DIAGNOSIS — J3081 Allergic rhinitis due to animal (cat) (dog) hair and dander: Secondary | ICD-10-CM | POA: Diagnosis not present

## 2016-12-09 DIAGNOSIS — J301 Allergic rhinitis due to pollen: Secondary | ICD-10-CM | POA: Diagnosis not present

## 2016-12-09 DIAGNOSIS — J3089 Other allergic rhinitis: Secondary | ICD-10-CM | POA: Diagnosis not present

## 2016-12-10 DIAGNOSIS — R0602 Shortness of breath: Secondary | ICD-10-CM | POA: Diagnosis not present

## 2016-12-16 DIAGNOSIS — J3089 Other allergic rhinitis: Secondary | ICD-10-CM | POA: Diagnosis not present

## 2016-12-16 DIAGNOSIS — J3081 Allergic rhinitis due to animal (cat) (dog) hair and dander: Secondary | ICD-10-CM | POA: Diagnosis not present

## 2016-12-16 DIAGNOSIS — J301 Allergic rhinitis due to pollen: Secondary | ICD-10-CM | POA: Diagnosis not present

## 2016-12-23 DIAGNOSIS — J3081 Allergic rhinitis due to animal (cat) (dog) hair and dander: Secondary | ICD-10-CM | POA: Diagnosis not present

## 2016-12-23 DIAGNOSIS — J301 Allergic rhinitis due to pollen: Secondary | ICD-10-CM | POA: Diagnosis not present

## 2016-12-23 DIAGNOSIS — J3089 Other allergic rhinitis: Secondary | ICD-10-CM | POA: Diagnosis not present

## 2016-12-30 DIAGNOSIS — J3089 Other allergic rhinitis: Secondary | ICD-10-CM | POA: Diagnosis not present

## 2016-12-30 DIAGNOSIS — J301 Allergic rhinitis due to pollen: Secondary | ICD-10-CM | POA: Diagnosis not present

## 2016-12-30 DIAGNOSIS — J3081 Allergic rhinitis due to animal (cat) (dog) hair and dander: Secondary | ICD-10-CM | POA: Diagnosis not present

## 2016-12-31 DIAGNOSIS — K1230 Oral mucositis (ulcerative), unspecified: Secondary | ICD-10-CM | POA: Diagnosis not present

## 2016-12-31 DIAGNOSIS — I10 Essential (primary) hypertension: Secondary | ICD-10-CM | POA: Diagnosis not present

## 2016-12-31 DIAGNOSIS — E039 Hypothyroidism, unspecified: Secondary | ICD-10-CM | POA: Diagnosis not present

## 2017-01-05 DIAGNOSIS — Z1231 Encounter for screening mammogram for malignant neoplasm of breast: Secondary | ICD-10-CM | POA: Diagnosis not present

## 2017-01-06 DIAGNOSIS — J3089 Other allergic rhinitis: Secondary | ICD-10-CM | POA: Diagnosis not present

## 2017-01-06 DIAGNOSIS — J301 Allergic rhinitis due to pollen: Secondary | ICD-10-CM | POA: Diagnosis not present

## 2017-01-06 DIAGNOSIS — J3081 Allergic rhinitis due to animal (cat) (dog) hair and dander: Secondary | ICD-10-CM | POA: Diagnosis not present

## 2017-01-13 DIAGNOSIS — J3089 Other allergic rhinitis: Secondary | ICD-10-CM | POA: Diagnosis not present

## 2017-01-13 DIAGNOSIS — J3081 Allergic rhinitis due to animal (cat) (dog) hair and dander: Secondary | ICD-10-CM | POA: Diagnosis not present

## 2017-01-13 DIAGNOSIS — J301 Allergic rhinitis due to pollen: Secondary | ICD-10-CM | POA: Diagnosis not present

## 2017-01-19 DIAGNOSIS — R922 Inconclusive mammogram: Secondary | ICD-10-CM | POA: Diagnosis not present

## 2017-01-19 DIAGNOSIS — N6321 Unspecified lump in the left breast, upper outer quadrant: Secondary | ICD-10-CM | POA: Diagnosis not present

## 2017-01-19 DIAGNOSIS — R928 Other abnormal and inconclusive findings on diagnostic imaging of breast: Secondary | ICD-10-CM | POA: Diagnosis not present

## 2017-01-20 DIAGNOSIS — J3089 Other allergic rhinitis: Secondary | ICD-10-CM | POA: Diagnosis not present

## 2017-01-20 DIAGNOSIS — J301 Allergic rhinitis due to pollen: Secondary | ICD-10-CM | POA: Diagnosis not present

## 2017-01-20 DIAGNOSIS — J3081 Allergic rhinitis due to animal (cat) (dog) hair and dander: Secondary | ICD-10-CM | POA: Diagnosis not present

## 2017-01-27 DIAGNOSIS — J3081 Allergic rhinitis due to animal (cat) (dog) hair and dander: Secondary | ICD-10-CM | POA: Diagnosis not present

## 2017-01-27 DIAGNOSIS — J301 Allergic rhinitis due to pollen: Secondary | ICD-10-CM | POA: Diagnosis not present

## 2017-01-27 DIAGNOSIS — J3089 Other allergic rhinitis: Secondary | ICD-10-CM | POA: Diagnosis not present

## 2017-01-30 DIAGNOSIS — M25562 Pain in left knee: Secondary | ICD-10-CM | POA: Diagnosis not present

## 2017-01-30 DIAGNOSIS — M25561 Pain in right knee: Secondary | ICD-10-CM | POA: Diagnosis not present

## 2017-01-30 DIAGNOSIS — M170 Bilateral primary osteoarthritis of knee: Secondary | ICD-10-CM | POA: Diagnosis not present

## 2017-02-03 DIAGNOSIS — J301 Allergic rhinitis due to pollen: Secondary | ICD-10-CM | POA: Diagnosis not present

## 2017-02-03 DIAGNOSIS — J3089 Other allergic rhinitis: Secondary | ICD-10-CM | POA: Diagnosis not present

## 2017-02-03 DIAGNOSIS — J3081 Allergic rhinitis due to animal (cat) (dog) hair and dander: Secondary | ICD-10-CM | POA: Diagnosis not present

## 2017-02-09 DIAGNOSIS — I10 Essential (primary) hypertension: Secondary | ICD-10-CM | POA: Diagnosis not present

## 2017-02-09 DIAGNOSIS — R928 Other abnormal and inconclusive findings on diagnostic imaging of breast: Secondary | ICD-10-CM | POA: Diagnosis not present

## 2017-02-09 DIAGNOSIS — R922 Inconclusive mammogram: Secondary | ICD-10-CM | POA: Diagnosis not present

## 2017-02-09 DIAGNOSIS — N63 Unspecified lump in unspecified breast: Secondary | ICD-10-CM | POA: Diagnosis not present

## 2017-02-09 DIAGNOSIS — E079 Disorder of thyroid, unspecified: Secondary | ICD-10-CM | POA: Diagnosis not present

## 2017-02-09 DIAGNOSIS — K469 Unspecified abdominal hernia without obstruction or gangrene: Secondary | ICD-10-CM | POA: Diagnosis not present

## 2017-02-09 DIAGNOSIS — Z79899 Other long term (current) drug therapy: Secondary | ICD-10-CM | POA: Diagnosis not present

## 2017-02-09 DIAGNOSIS — N6321 Unspecified lump in the left breast, upper outer quadrant: Secondary | ICD-10-CM | POA: Diagnosis not present

## 2017-02-09 DIAGNOSIS — Z45812 Encounter for adjustment or removal of left breast implant: Secondary | ICD-10-CM | POA: Diagnosis not present

## 2017-02-10 DIAGNOSIS — J3089 Other allergic rhinitis: Secondary | ICD-10-CM | POA: Diagnosis not present

## 2017-02-10 DIAGNOSIS — J3081 Allergic rhinitis due to animal (cat) (dog) hair and dander: Secondary | ICD-10-CM | POA: Diagnosis not present

## 2017-02-10 DIAGNOSIS — J301 Allergic rhinitis due to pollen: Secondary | ICD-10-CM | POA: Diagnosis not present

## 2017-02-17 DIAGNOSIS — J301 Allergic rhinitis due to pollen: Secondary | ICD-10-CM | POA: Diagnosis not present

## 2017-02-17 DIAGNOSIS — J3081 Allergic rhinitis due to animal (cat) (dog) hair and dander: Secondary | ICD-10-CM | POA: Diagnosis not present

## 2017-02-17 DIAGNOSIS — J3089 Other allergic rhinitis: Secondary | ICD-10-CM | POA: Diagnosis not present

## 2017-02-24 DIAGNOSIS — J3089 Other allergic rhinitis: Secondary | ICD-10-CM | POA: Diagnosis not present

## 2017-02-24 DIAGNOSIS — J3081 Allergic rhinitis due to animal (cat) (dog) hair and dander: Secondary | ICD-10-CM | POA: Diagnosis not present

## 2017-02-24 DIAGNOSIS — J301 Allergic rhinitis due to pollen: Secondary | ICD-10-CM | POA: Diagnosis not present

## 2017-03-03 DIAGNOSIS — J3081 Allergic rhinitis due to animal (cat) (dog) hair and dander: Secondary | ICD-10-CM | POA: Diagnosis not present

## 2017-03-03 DIAGNOSIS — J3089 Other allergic rhinitis: Secondary | ICD-10-CM | POA: Diagnosis not present

## 2017-03-03 DIAGNOSIS — J301 Allergic rhinitis due to pollen: Secondary | ICD-10-CM | POA: Diagnosis not present

## 2017-03-10 DIAGNOSIS — J301 Allergic rhinitis due to pollen: Secondary | ICD-10-CM | POA: Diagnosis not present

## 2017-03-10 DIAGNOSIS — J3089 Other allergic rhinitis: Secondary | ICD-10-CM | POA: Diagnosis not present

## 2017-03-10 DIAGNOSIS — J3081 Allergic rhinitis due to animal (cat) (dog) hair and dander: Secondary | ICD-10-CM | POA: Diagnosis not present

## 2017-03-17 DIAGNOSIS — J3081 Allergic rhinitis due to animal (cat) (dog) hair and dander: Secondary | ICD-10-CM | POA: Diagnosis not present

## 2017-03-17 DIAGNOSIS — J3089 Other allergic rhinitis: Secondary | ICD-10-CM | POA: Diagnosis not present

## 2017-03-17 DIAGNOSIS — J301 Allergic rhinitis due to pollen: Secondary | ICD-10-CM | POA: Diagnosis not present

## 2017-03-24 DIAGNOSIS — J3081 Allergic rhinitis due to animal (cat) (dog) hair and dander: Secondary | ICD-10-CM | POA: Diagnosis not present

## 2017-03-24 DIAGNOSIS — J3089 Other allergic rhinitis: Secondary | ICD-10-CM | POA: Diagnosis not present

## 2017-03-24 DIAGNOSIS — J301 Allergic rhinitis due to pollen: Secondary | ICD-10-CM | POA: Diagnosis not present

## 2017-03-31 DIAGNOSIS — J3081 Allergic rhinitis due to animal (cat) (dog) hair and dander: Secondary | ICD-10-CM | POA: Diagnosis not present

## 2017-03-31 DIAGNOSIS — J3089 Other allergic rhinitis: Secondary | ICD-10-CM | POA: Diagnosis not present

## 2017-03-31 DIAGNOSIS — J301 Allergic rhinitis due to pollen: Secondary | ICD-10-CM | POA: Diagnosis not present

## 2017-04-07 DIAGNOSIS — J3081 Allergic rhinitis due to animal (cat) (dog) hair and dander: Secondary | ICD-10-CM | POA: Diagnosis not present

## 2017-04-07 DIAGNOSIS — J301 Allergic rhinitis due to pollen: Secondary | ICD-10-CM | POA: Diagnosis not present

## 2017-04-07 DIAGNOSIS — J3089 Other allergic rhinitis: Secondary | ICD-10-CM | POA: Diagnosis not present

## 2017-04-13 DIAGNOSIS — J3081 Allergic rhinitis due to animal (cat) (dog) hair and dander: Secondary | ICD-10-CM | POA: Diagnosis not present

## 2017-04-13 DIAGNOSIS — J301 Allergic rhinitis due to pollen: Secondary | ICD-10-CM | POA: Diagnosis not present

## 2017-04-13 DIAGNOSIS — J3089 Other allergic rhinitis: Secondary | ICD-10-CM | POA: Diagnosis not present

## 2017-04-14 DIAGNOSIS — J3089 Other allergic rhinitis: Secondary | ICD-10-CM | POA: Diagnosis not present

## 2017-04-14 DIAGNOSIS — J301 Allergic rhinitis due to pollen: Secondary | ICD-10-CM | POA: Diagnosis not present

## 2017-04-14 DIAGNOSIS — J3081 Allergic rhinitis due to animal (cat) (dog) hair and dander: Secondary | ICD-10-CM | POA: Diagnosis not present

## 2017-04-21 DIAGNOSIS — J301 Allergic rhinitis due to pollen: Secondary | ICD-10-CM | POA: Diagnosis not present

## 2017-04-21 DIAGNOSIS — J3081 Allergic rhinitis due to animal (cat) (dog) hair and dander: Secondary | ICD-10-CM | POA: Diagnosis not present

## 2017-04-21 DIAGNOSIS — J3089 Other allergic rhinitis: Secondary | ICD-10-CM | POA: Diagnosis not present

## 2017-04-28 DIAGNOSIS — J301 Allergic rhinitis due to pollen: Secondary | ICD-10-CM | POA: Diagnosis not present

## 2017-04-28 DIAGNOSIS — J3089 Other allergic rhinitis: Secondary | ICD-10-CM | POA: Diagnosis not present

## 2017-04-28 DIAGNOSIS — J3081 Allergic rhinitis due to animal (cat) (dog) hair and dander: Secondary | ICD-10-CM | POA: Diagnosis not present

## 2017-05-05 DIAGNOSIS — I10 Essential (primary) hypertension: Secondary | ICD-10-CM | POA: Diagnosis not present

## 2017-05-05 DIAGNOSIS — G4733 Obstructive sleep apnea (adult) (pediatric): Secondary | ICD-10-CM | POA: Diagnosis not present

## 2017-05-05 DIAGNOSIS — Z0001 Encounter for general adult medical examination with abnormal findings: Secondary | ICD-10-CM | POA: Diagnosis not present

## 2017-05-05 DIAGNOSIS — M170 Bilateral primary osteoarthritis of knee: Secondary | ICD-10-CM | POA: Diagnosis not present

## 2017-05-05 DIAGNOSIS — N390 Urinary tract infection, site not specified: Secondary | ICD-10-CM | POA: Diagnosis not present

## 2017-05-05 DIAGNOSIS — E039 Hypothyroidism, unspecified: Secondary | ICD-10-CM | POA: Diagnosis not present

## 2017-05-06 DIAGNOSIS — E559 Vitamin D deficiency, unspecified: Secondary | ICD-10-CM | POA: Diagnosis not present

## 2017-05-06 DIAGNOSIS — Z0001 Encounter for general adult medical examination with abnormal findings: Secondary | ICD-10-CM | POA: Diagnosis not present

## 2017-05-06 DIAGNOSIS — E782 Mixed hyperlipidemia: Secondary | ICD-10-CM | POA: Diagnosis not present

## 2017-05-06 DIAGNOSIS — I10 Essential (primary) hypertension: Secondary | ICD-10-CM | POA: Diagnosis not present

## 2017-05-06 DIAGNOSIS — E039 Hypothyroidism, unspecified: Secondary | ICD-10-CM | POA: Diagnosis not present

## 2017-05-07 DIAGNOSIS — J3081 Allergic rhinitis due to animal (cat) (dog) hair and dander: Secondary | ICD-10-CM | POA: Diagnosis not present

## 2017-05-07 DIAGNOSIS — J301 Allergic rhinitis due to pollen: Secondary | ICD-10-CM | POA: Diagnosis not present

## 2017-05-07 DIAGNOSIS — J3089 Other allergic rhinitis: Secondary | ICD-10-CM | POA: Diagnosis not present

## 2017-05-12 DIAGNOSIS — J3089 Other allergic rhinitis: Secondary | ICD-10-CM | POA: Diagnosis not present

## 2017-05-12 DIAGNOSIS — J301 Allergic rhinitis due to pollen: Secondary | ICD-10-CM | POA: Diagnosis not present

## 2017-05-12 DIAGNOSIS — J3081 Allergic rhinitis due to animal (cat) (dog) hair and dander: Secondary | ICD-10-CM | POA: Diagnosis not present

## 2017-05-19 DIAGNOSIS — J3081 Allergic rhinitis due to animal (cat) (dog) hair and dander: Secondary | ICD-10-CM | POA: Diagnosis not present

## 2017-05-19 DIAGNOSIS — J301 Allergic rhinitis due to pollen: Secondary | ICD-10-CM | POA: Diagnosis not present

## 2017-05-19 DIAGNOSIS — J3089 Other allergic rhinitis: Secondary | ICD-10-CM | POA: Diagnosis not present

## 2017-05-25 DIAGNOSIS — G4710 Hypersomnia, unspecified: Secondary | ICD-10-CM | POA: Diagnosis not present

## 2017-05-25 DIAGNOSIS — J301 Allergic rhinitis due to pollen: Secondary | ICD-10-CM | POA: Diagnosis not present

## 2017-05-25 DIAGNOSIS — R0602 Shortness of breath: Secondary | ICD-10-CM | POA: Diagnosis not present

## 2017-05-25 DIAGNOSIS — G4733 Obstructive sleep apnea (adult) (pediatric): Secondary | ICD-10-CM | POA: Diagnosis not present

## 2017-05-26 DIAGNOSIS — J301 Allergic rhinitis due to pollen: Secondary | ICD-10-CM | POA: Diagnosis not present

## 2017-05-26 DIAGNOSIS — J3081 Allergic rhinitis due to animal (cat) (dog) hair and dander: Secondary | ICD-10-CM | POA: Diagnosis not present

## 2017-05-26 DIAGNOSIS — J3089 Other allergic rhinitis: Secondary | ICD-10-CM | POA: Diagnosis not present

## 2017-06-02 DIAGNOSIS — J301 Allergic rhinitis due to pollen: Secondary | ICD-10-CM | POA: Diagnosis not present

## 2017-06-02 DIAGNOSIS — J3089 Other allergic rhinitis: Secondary | ICD-10-CM | POA: Diagnosis not present

## 2017-06-02 DIAGNOSIS — J3081 Allergic rhinitis due to animal (cat) (dog) hair and dander: Secondary | ICD-10-CM | POA: Diagnosis not present

## 2017-06-09 DIAGNOSIS — J301 Allergic rhinitis due to pollen: Secondary | ICD-10-CM | POA: Diagnosis not present

## 2017-06-09 DIAGNOSIS — J3081 Allergic rhinitis due to animal (cat) (dog) hair and dander: Secondary | ICD-10-CM | POA: Diagnosis not present

## 2017-06-09 DIAGNOSIS — J3089 Other allergic rhinitis: Secondary | ICD-10-CM | POA: Diagnosis not present

## 2017-06-16 DIAGNOSIS — J3089 Other allergic rhinitis: Secondary | ICD-10-CM | POA: Diagnosis not present

## 2017-06-16 DIAGNOSIS — J301 Allergic rhinitis due to pollen: Secondary | ICD-10-CM | POA: Diagnosis not present

## 2017-06-16 DIAGNOSIS — J3081 Allergic rhinitis due to animal (cat) (dog) hair and dander: Secondary | ICD-10-CM | POA: Diagnosis not present

## 2017-06-23 DIAGNOSIS — J3089 Other allergic rhinitis: Secondary | ICD-10-CM | POA: Diagnosis not present

## 2017-06-23 DIAGNOSIS — J3081 Allergic rhinitis due to animal (cat) (dog) hair and dander: Secondary | ICD-10-CM | POA: Diagnosis not present

## 2017-06-23 DIAGNOSIS — J301 Allergic rhinitis due to pollen: Secondary | ICD-10-CM | POA: Diagnosis not present

## 2017-06-30 DIAGNOSIS — J3081 Allergic rhinitis due to animal (cat) (dog) hair and dander: Secondary | ICD-10-CM | POA: Diagnosis not present

## 2017-06-30 DIAGNOSIS — J3089 Other allergic rhinitis: Secondary | ICD-10-CM | POA: Diagnosis not present

## 2017-06-30 DIAGNOSIS — J301 Allergic rhinitis due to pollen: Secondary | ICD-10-CM | POA: Diagnosis not present

## 2017-07-07 DIAGNOSIS — J3089 Other allergic rhinitis: Secondary | ICD-10-CM | POA: Diagnosis not present

## 2017-07-07 DIAGNOSIS — J301 Allergic rhinitis due to pollen: Secondary | ICD-10-CM | POA: Diagnosis not present

## 2017-07-07 DIAGNOSIS — J3081 Allergic rhinitis due to animal (cat) (dog) hair and dander: Secondary | ICD-10-CM | POA: Diagnosis not present

## 2017-07-14 DIAGNOSIS — J3081 Allergic rhinitis due to animal (cat) (dog) hair and dander: Secondary | ICD-10-CM | POA: Diagnosis not present

## 2017-07-14 DIAGNOSIS — J3089 Other allergic rhinitis: Secondary | ICD-10-CM | POA: Diagnosis not present

## 2017-07-14 DIAGNOSIS — J301 Allergic rhinitis due to pollen: Secondary | ICD-10-CM | POA: Diagnosis not present

## 2017-07-21 DIAGNOSIS — J3081 Allergic rhinitis due to animal (cat) (dog) hair and dander: Secondary | ICD-10-CM | POA: Diagnosis not present

## 2017-07-21 DIAGNOSIS — J301 Allergic rhinitis due to pollen: Secondary | ICD-10-CM | POA: Diagnosis not present

## 2017-07-21 DIAGNOSIS — J3089 Other allergic rhinitis: Secondary | ICD-10-CM | POA: Diagnosis not present

## 2017-07-28 DIAGNOSIS — J301 Allergic rhinitis due to pollen: Secondary | ICD-10-CM | POA: Diagnosis not present

## 2017-07-28 DIAGNOSIS — J3089 Other allergic rhinitis: Secondary | ICD-10-CM | POA: Diagnosis not present

## 2017-07-28 DIAGNOSIS — J3081 Allergic rhinitis due to animal (cat) (dog) hair and dander: Secondary | ICD-10-CM | POA: Diagnosis not present

## 2017-08-04 DIAGNOSIS — J3089 Other allergic rhinitis: Secondary | ICD-10-CM | POA: Diagnosis not present

## 2017-08-04 DIAGNOSIS — J3081 Allergic rhinitis due to animal (cat) (dog) hair and dander: Secondary | ICD-10-CM | POA: Diagnosis not present

## 2017-08-04 DIAGNOSIS — J301 Allergic rhinitis due to pollen: Secondary | ICD-10-CM | POA: Diagnosis not present

## 2017-08-05 DIAGNOSIS — G4733 Obstructive sleep apnea (adult) (pediatric): Secondary | ICD-10-CM | POA: Diagnosis not present

## 2017-08-11 DIAGNOSIS — J3089 Other allergic rhinitis: Secondary | ICD-10-CM | POA: Diagnosis not present

## 2017-08-11 DIAGNOSIS — J301 Allergic rhinitis due to pollen: Secondary | ICD-10-CM | POA: Diagnosis not present

## 2017-08-11 DIAGNOSIS — J3081 Allergic rhinitis due to animal (cat) (dog) hair and dander: Secondary | ICD-10-CM | POA: Diagnosis not present

## 2017-08-11 DIAGNOSIS — G4733 Obstructive sleep apnea (adult) (pediatric): Secondary | ICD-10-CM | POA: Diagnosis not present

## 2017-08-18 DIAGNOSIS — J301 Allergic rhinitis due to pollen: Secondary | ICD-10-CM | POA: Diagnosis not present

## 2017-08-18 DIAGNOSIS — J3081 Allergic rhinitis due to animal (cat) (dog) hair and dander: Secondary | ICD-10-CM | POA: Diagnosis not present

## 2017-08-18 DIAGNOSIS — J3089 Other allergic rhinitis: Secondary | ICD-10-CM | POA: Diagnosis not present

## 2017-08-25 DIAGNOSIS — J301 Allergic rhinitis due to pollen: Secondary | ICD-10-CM | POA: Diagnosis not present

## 2017-08-25 DIAGNOSIS — J3089 Other allergic rhinitis: Secondary | ICD-10-CM | POA: Diagnosis not present

## 2017-08-25 DIAGNOSIS — J3081 Allergic rhinitis due to animal (cat) (dog) hair and dander: Secondary | ICD-10-CM | POA: Diagnosis not present

## 2017-09-01 DIAGNOSIS — J301 Allergic rhinitis due to pollen: Secondary | ICD-10-CM | POA: Diagnosis not present

## 2017-09-01 DIAGNOSIS — J3089 Other allergic rhinitis: Secondary | ICD-10-CM | POA: Diagnosis not present

## 2017-09-03 DIAGNOSIS — M25561 Pain in right knee: Secondary | ICD-10-CM | POA: Diagnosis not present

## 2017-09-03 DIAGNOSIS — M170 Bilateral primary osteoarthritis of knee: Secondary | ICD-10-CM | POA: Diagnosis not present

## 2017-09-03 DIAGNOSIS — M25562 Pain in left knee: Secondary | ICD-10-CM | POA: Diagnosis not present

## 2017-09-07 DIAGNOSIS — I10 Essential (primary) hypertension: Secondary | ICD-10-CM | POA: Diagnosis not present

## 2017-09-07 DIAGNOSIS — M170 Bilateral primary osteoarthritis of knee: Secondary | ICD-10-CM | POA: Diagnosis not present

## 2017-09-07 DIAGNOSIS — E039 Hypothyroidism, unspecified: Secondary | ICD-10-CM | POA: Diagnosis not present

## 2017-09-08 DIAGNOSIS — J3081 Allergic rhinitis due to animal (cat) (dog) hair and dander: Secondary | ICD-10-CM | POA: Diagnosis not present

## 2017-09-08 DIAGNOSIS — J3089 Other allergic rhinitis: Secondary | ICD-10-CM | POA: Diagnosis not present

## 2017-09-08 DIAGNOSIS — J301 Allergic rhinitis due to pollen: Secondary | ICD-10-CM | POA: Diagnosis not present

## 2017-09-17 DIAGNOSIS — J3089 Other allergic rhinitis: Secondary | ICD-10-CM | POA: Diagnosis not present

## 2017-09-17 DIAGNOSIS — J3081 Allergic rhinitis due to animal (cat) (dog) hair and dander: Secondary | ICD-10-CM | POA: Diagnosis not present

## 2017-09-17 DIAGNOSIS — J301 Allergic rhinitis due to pollen: Secondary | ICD-10-CM | POA: Diagnosis not present

## 2017-09-22 DIAGNOSIS — J301 Allergic rhinitis due to pollen: Secondary | ICD-10-CM | POA: Diagnosis not present

## 2017-09-22 DIAGNOSIS — J3081 Allergic rhinitis due to animal (cat) (dog) hair and dander: Secondary | ICD-10-CM | POA: Diagnosis not present

## 2017-09-22 DIAGNOSIS — J3089 Other allergic rhinitis: Secondary | ICD-10-CM | POA: Diagnosis not present

## 2017-09-25 DIAGNOSIS — J301 Allergic rhinitis due to pollen: Secondary | ICD-10-CM | POA: Diagnosis not present

## 2017-09-30 DIAGNOSIS — J3081 Allergic rhinitis due to animal (cat) (dog) hair and dander: Secondary | ICD-10-CM | POA: Diagnosis not present

## 2017-09-30 DIAGNOSIS — J3089 Other allergic rhinitis: Secondary | ICD-10-CM | POA: Diagnosis not present

## 2017-10-01 DIAGNOSIS — J301 Allergic rhinitis due to pollen: Secondary | ICD-10-CM | POA: Diagnosis not present

## 2017-10-01 DIAGNOSIS — J3089 Other allergic rhinitis: Secondary | ICD-10-CM | POA: Diagnosis not present

## 2017-10-01 DIAGNOSIS — J3081 Allergic rhinitis due to animal (cat) (dog) hair and dander: Secondary | ICD-10-CM | POA: Diagnosis not present

## 2017-10-08 DIAGNOSIS — J3081 Allergic rhinitis due to animal (cat) (dog) hair and dander: Secondary | ICD-10-CM | POA: Diagnosis not present

## 2017-10-08 DIAGNOSIS — J3089 Other allergic rhinitis: Secondary | ICD-10-CM | POA: Diagnosis not present

## 2017-10-08 DIAGNOSIS — J301 Allergic rhinitis due to pollen: Secondary | ICD-10-CM | POA: Diagnosis not present

## 2017-10-13 DIAGNOSIS — J301 Allergic rhinitis due to pollen: Secondary | ICD-10-CM | POA: Diagnosis not present

## 2017-10-13 DIAGNOSIS — J3089 Other allergic rhinitis: Secondary | ICD-10-CM | POA: Diagnosis not present

## 2017-10-13 DIAGNOSIS — J3081 Allergic rhinitis due to animal (cat) (dog) hair and dander: Secondary | ICD-10-CM | POA: Diagnosis not present

## 2017-10-20 DIAGNOSIS — J3089 Other allergic rhinitis: Secondary | ICD-10-CM | POA: Diagnosis not present

## 2017-10-20 DIAGNOSIS — J301 Allergic rhinitis due to pollen: Secondary | ICD-10-CM | POA: Diagnosis not present

## 2017-10-20 DIAGNOSIS — J3081 Allergic rhinitis due to animal (cat) (dog) hair and dander: Secondary | ICD-10-CM | POA: Diagnosis not present

## 2017-10-22 DIAGNOSIS — J301 Allergic rhinitis due to pollen: Secondary | ICD-10-CM | POA: Diagnosis not present

## 2017-10-22 DIAGNOSIS — J3089 Other allergic rhinitis: Secondary | ICD-10-CM | POA: Diagnosis not present

## 2017-10-27 ENCOUNTER — Other Ambulatory Visit: Payer: Self-pay

## 2017-10-27 DIAGNOSIS — J3081 Allergic rhinitis due to animal (cat) (dog) hair and dander: Secondary | ICD-10-CM | POA: Diagnosis not present

## 2017-10-27 DIAGNOSIS — J301 Allergic rhinitis due to pollen: Secondary | ICD-10-CM | POA: Diagnosis not present

## 2017-10-27 DIAGNOSIS — J3089 Other allergic rhinitis: Secondary | ICD-10-CM | POA: Diagnosis not present

## 2017-10-27 MED ORDER — ATENOLOL 25 MG PO TABS: 25.0000 mg | ORAL_TABLET | Freq: Two times a day (BID) | ORAL | 1 refills | Status: DC

## 2017-10-29 DIAGNOSIS — J3089 Other allergic rhinitis: Secondary | ICD-10-CM | POA: Diagnosis not present

## 2017-10-29 DIAGNOSIS — J301 Allergic rhinitis due to pollen: Secondary | ICD-10-CM | POA: Diagnosis not present

## 2017-10-29 DIAGNOSIS — J3081 Allergic rhinitis due to animal (cat) (dog) hair and dander: Secondary | ICD-10-CM | POA: Diagnosis not present

## 2017-11-03 DIAGNOSIS — J301 Allergic rhinitis due to pollen: Secondary | ICD-10-CM | POA: Diagnosis not present

## 2017-11-03 DIAGNOSIS — J3089 Other allergic rhinitis: Secondary | ICD-10-CM | POA: Diagnosis not present

## 2017-11-03 DIAGNOSIS — J3081 Allergic rhinitis due to animal (cat) (dog) hair and dander: Secondary | ICD-10-CM | POA: Diagnosis not present

## 2017-11-06 DIAGNOSIS — J301 Allergic rhinitis due to pollen: Secondary | ICD-10-CM | POA: Diagnosis not present

## 2017-11-06 DIAGNOSIS — J3081 Allergic rhinitis due to animal (cat) (dog) hair and dander: Secondary | ICD-10-CM | POA: Diagnosis not present

## 2017-11-06 DIAGNOSIS — J3089 Other allergic rhinitis: Secondary | ICD-10-CM | POA: Diagnosis not present

## 2017-11-10 DIAGNOSIS — J3089 Other allergic rhinitis: Secondary | ICD-10-CM | POA: Diagnosis not present

## 2017-11-10 DIAGNOSIS — J301 Allergic rhinitis due to pollen: Secondary | ICD-10-CM | POA: Diagnosis not present

## 2017-11-12 DIAGNOSIS — J3089 Other allergic rhinitis: Secondary | ICD-10-CM | POA: Diagnosis not present

## 2017-11-12 DIAGNOSIS — J301 Allergic rhinitis due to pollen: Secondary | ICD-10-CM | POA: Diagnosis not present

## 2017-11-23 ENCOUNTER — Ambulatory Visit: Payer: Self-pay | Admitting: Internal Medicine

## 2017-12-01 DIAGNOSIS — J3081 Allergic rhinitis due to animal (cat) (dog) hair and dander: Secondary | ICD-10-CM | POA: Diagnosis not present

## 2017-12-01 DIAGNOSIS — J3089 Other allergic rhinitis: Secondary | ICD-10-CM | POA: Diagnosis not present

## 2017-12-01 DIAGNOSIS — J301 Allergic rhinitis due to pollen: Secondary | ICD-10-CM | POA: Diagnosis not present

## 2017-12-03 DIAGNOSIS — J301 Allergic rhinitis due to pollen: Secondary | ICD-10-CM | POA: Diagnosis not present

## 2017-12-03 DIAGNOSIS — J3081 Allergic rhinitis due to animal (cat) (dog) hair and dander: Secondary | ICD-10-CM | POA: Diagnosis not present

## 2017-12-03 DIAGNOSIS — J3089 Other allergic rhinitis: Secondary | ICD-10-CM | POA: Diagnosis not present

## 2017-12-08 DIAGNOSIS — J3089 Other allergic rhinitis: Secondary | ICD-10-CM | POA: Diagnosis not present

## 2017-12-08 DIAGNOSIS — J3081 Allergic rhinitis due to animal (cat) (dog) hair and dander: Secondary | ICD-10-CM | POA: Diagnosis not present

## 2017-12-08 DIAGNOSIS — J301 Allergic rhinitis due to pollen: Secondary | ICD-10-CM | POA: Diagnosis not present

## 2017-12-15 DIAGNOSIS — J3081 Allergic rhinitis due to animal (cat) (dog) hair and dander: Secondary | ICD-10-CM | POA: Diagnosis not present

## 2017-12-15 DIAGNOSIS — J301 Allergic rhinitis due to pollen: Secondary | ICD-10-CM | POA: Diagnosis not present

## 2017-12-15 DIAGNOSIS — H1045 Other chronic allergic conjunctivitis: Secondary | ICD-10-CM | POA: Diagnosis not present

## 2017-12-15 DIAGNOSIS — J3089 Other allergic rhinitis: Secondary | ICD-10-CM | POA: Diagnosis not present

## 2017-12-22 DIAGNOSIS — J3089 Other allergic rhinitis: Secondary | ICD-10-CM | POA: Diagnosis not present

## 2017-12-22 DIAGNOSIS — J3081 Allergic rhinitis due to animal (cat) (dog) hair and dander: Secondary | ICD-10-CM | POA: Diagnosis not present

## 2017-12-22 DIAGNOSIS — J301 Allergic rhinitis due to pollen: Secondary | ICD-10-CM | POA: Diagnosis not present

## 2017-12-29 ENCOUNTER — Encounter: Payer: Self-pay | Admitting: Internal Medicine

## 2017-12-29 ENCOUNTER — Ambulatory Visit: Payer: Medicare Other | Admitting: Internal Medicine

## 2017-12-29 VITALS — BP 157/82 | HR 75 | Temp 98.6°F | Resp 16 | Ht <= 58 in | Wt 197.0 lb

## 2017-12-29 DIAGNOSIS — J3081 Allergic rhinitis due to animal (cat) (dog) hair and dander: Secondary | ICD-10-CM | POA: Diagnosis not present

## 2017-12-29 DIAGNOSIS — J3089 Other allergic rhinitis: Secondary | ICD-10-CM | POA: Diagnosis not present

## 2017-12-29 DIAGNOSIS — Z9989 Dependence on other enabling machines and devices: Secondary | ICD-10-CM

## 2017-12-29 DIAGNOSIS — G4733 Obstructive sleep apnea (adult) (pediatric): Secondary | ICD-10-CM | POA: Diagnosis not present

## 2017-12-29 DIAGNOSIS — J301 Allergic rhinitis due to pollen: Secondary | ICD-10-CM | POA: Diagnosis not present

## 2017-12-29 NOTE — Progress Notes (Signed)
[facility] [place], [place] [address]  Pulmonary Sleep Medicine  Office Visit Note  Patient Name: Claudia Martin DOB: 07/20/1946 MRN [id number]  Date of Service: 12/29/2017  Complaints/HPI:  She is doing well basically has been using her CPAP as prescribed.  She does need to have a download done and will need to schedule an appointment in the Sleep Clinic.  She has had some issues with her mask needs a new mask.  Spoke with her and she is going to work with her DME provider to get the new mask.  Also needs to work on losing some weight however she has significant arthritis and is therefore not able to do much in the way of exercising.  ROS  General: (-) fever, (-) chills, (-) night sweats, (-) weakness Skin: (-) rashes, (-) itching,. Eyes: (-) visual changes, (-) redness, (-) itching. Nose and Sinuses: (-) nasal stuffiness or itchiness, (-) postnasal drip, (-) nosebleeds, (-) sinus trouble. Mouth and Throat: (-) sore throat, (-) hoarseness. Neck: (-) swollen glands, (-) enlarged thyroid, (-) neck pain. Respiratory: - cough, (-) bloody sputum, - shortness of breath, - wheezing. Cardiovascular: - ankle swelling, (-) chest pain. Lymphatic: (-) lymph node enlargement. Neurologic: (-) numbness, (-) tingling. Psychiatric: (-) anxiety, (-) depression   Current Medication: Outpatient Encounter Medications as of 12/29/2017  Medication Sig Note  . amLODipine (NORVASC) 2.5 MG tablet Take 2.5 mg by mouth daily.   Marland Kitchen atenolol (TENORMIN) 25 MG tablet Take 1 tablet (25 mg total) by mouth 2 (two) times daily. Pt takes 1 and 1/2 tablets twice a day.   . cetirizine (ZYRTEC) 10 MG tablet Take 10 mg by mouth daily.   . chlorhexidine (PERIDEX) 0.12 % solution Use as directed 5 mLs in the mouth or throat 2 (two) times daily. For 2 weeks   . conjugated estrogens (PREMARIN) vaginal cream Place 1 Applicatorful vaginally 2 (two) times daily after a meal.   . ketoconazole  (NIZORAL) 2 % cream Apply 1 application topically daily.   . montelukast (SINGULAIR) 10 MG tablet Take by mouth.   . Olopatadine HCl (PATADAY) 0.2 % SOLN Place 1 drop into both eyes 2 (two) times daily.   . valACYclovir (VALTREX) 1000 MG tablet Take 1,000 mg by mouth daily. Pt can 1 tablet twice a day if flare up for 7 days.   Marland Kitchen EPINEPHrine (EPIPEN 2-PAK) 0.3 mg/0.3 mL IJ SOAJ injection as needed. 07/16/2015: Received from: [facility]  . fluconazole (DIFLUCAN) 150 MG tablet take 1 tablet by mouth once daily for 3 days 07/16/2015: Received from: External Pharmacy  . levothyroxine (SYNTHROID, LEVOTHROID) 50 MCG tablet Take 50 mcg by mouth daily before breakfast.   . medroxyPROGESTERone (PROVERA) 2.5 MG tablet once daily. 07/16/2015: Received from: [place] [facility]  . metroNIDAZOLE (METROGEL) 0.75 % vaginal gel  07/16/2015: Received from: [facility]  . naproxen sodium (ALEVE) 220 MG tablet Take by mouth. 07/16/2015: Received from: [facility]  . nystatin cream (MYCOSTATIN) Apply 1 application topically 2 (two) times daily.   Marland Kitchen olopatadine (PATANOL) 0.1 % ophthalmic solution instill 1 drop into both eyes twice a day for ITCHY EYES 07/16/2015: Received from: External Pharmacy   No facility-administered encounter medications on file as of 12/29/2017.     Surgical History: Past Surgical History:  Procedure Laterality Date  . BREAST BIOPSY    . diverticulitis    . ECTOPIC PREGNANCY SURGERY    . fx  thumb    . HERNIA REPAIR      Medical History: Past Medical History:  Diagnosis Date  . Abdominal pain   . Allergic rhinitis   . Candidiasis of vulva and vagina   . Cellulitis and abscess   . Diverticulosis   . Dysuria   . Eyelid inflammation   . Hematuria syndrome   . Hemorrhoids   . Herpes genitalis   . Herpes simplex   . HTN (hypertension)   . Hydronephrosis   . Hypothyroidism   . Incomplete bladder emptying   .  Internal hordeolum   . Knee pain   . Otalgia   . Primary ovarian failure   . Sciatica   . Shortness of breath   . Urethral diverticulum   . UTI (lower urinary tract infection)   . Vaginal atrophy   . Vaginitis     Family History: Family History  Problem Relation Age of Onset  . Tuberculosis Mother   . Nephrolithiasis Brother     Social History: Social History   Socioeconomic History  . Marital status: Divorced    Spouse name: Not on file  . Number of children: Not on file  . Years of education: Not on file  . Highest education level: Not on file  Occupational History  . Not on file  Social Needs  . Financial resource strain: Not on file  . Food insecurity:    Worry: Not on file    Inability: Not on file  . Transportation needs:    Medical: Not on file    Non-medical: Not on file  Tobacco Use  . Smoking status: Never Smoker  . Smokeless tobacco: Never Used  Substance and Sexual Activity  . Alcohol use: No  . Drug use: Not on file  . Sexual activity: Not on file  Lifestyle  . Physical activity:    Days per week: Not on file    Minutes per session: Not on file  . Stress: Not on file  Relationships  . Social connections:    Talks on phone: Not on file    Gets together: Not on file    Attends religious service: Not on file    Active member of club or organization: Not on file    Attends meetings of clubs or organizations: Not on file    Relationship status: Not on file  . Intimate partner violence:    Fear of current or ex partner: Not on file    Emotionally abused: Not on file    Physically abused: Not on file    Forced sexual activity: Not on file  Other Topics Concern  . Not on file  Social History Narrative  . Not on file    Vital Signs: Blood pressure (!) 157/82, pulse 75, temperature 98.6 F (37 C), resp. rate 16, height 4\' 7"  (1.397 m), weight 197 lb (89.4 kg), SpO2 97 %.  Examination: General Appearance: The patient is well-developed,  well-nourished, and in no distress. Skin: Gross inspection of skin unremarkable. Head: normocephalic, no gross deformities. Eyes: no gross deformities noted. ENT: ears appear grossly normal no exudates. Neck: Supple. No thyromegaly. No LAD. Respiratory: no rhonchi. Cardiovascular: Normal S1 and S2 without murmur or rub. Extremities: No cyanosis. pulses are equal. Neurologic: Alert and oriented. No involuntary movements.  LABS: No results found for this or any previous visit (from the past 2160 hour(s)).  Radiology: Dg Chest 2 View  Result Date: 06/26/2015 CLINICAL DATA:  Chest pain/tightness EXAM: CHEST  2 VIEW COMPARISON:  08/24/2010 FINDINGS: Lungs are clear.  No pleural effusion or pneumothorax. The heart is normal in size. Degenerative changes of the visualized thoracolumbar spine. Cholecystectomy clips. IMPRESSION: No evidence of acute cardiopulmonary disease. Electronically Signed   By: Julian Hy M.D.   On: 06/26/2015 02:27    No results found.  No results found.    Assessment and Plan: There are no active problems to display for this patient.   1. OSA she is doing well on the current pressures will be continued.  She will also need to have her machine downloaded as discussed today 2. Obesity needs to work on trying to work on weight loss.  She will continue with supportive care  General Counseling: I have discussed the findings of the evaluation and examination with Verdis Frederickson.  I have also discussed any further diagnostic evaluation thatmay be needed or ordered today. Dietrich verbalizes understanding of the findings of todays visit. We also reviewed her medications today and discussed drug interactions and side effects including but not limited excessive drowsiness and altered mental states. We also discussed that there is always a risk not just to her but also people around her. she has been encouraged to call the office with any questions or concerns that should arise  related to todays visit.    Time spent: 63min  I have personally obtained a history, examined the patient, evaluated laboratory and imaging results, formulated the assessment and plan and placed orders.    Allyne Gee, MD [facility] Pulmonary and Critical Care Sleep medicine

## 2017-12-29 NOTE — Patient Instructions (Signed)

## 2017-12-31 ENCOUNTER — Other Ambulatory Visit: Payer: Self-pay

## 2017-12-31 MED ORDER — ACCU-CHEK AVIVA PLUS W/DEVICE KIT: PACK | 0 refills | Status: DC

## 2017-12-31 MED ORDER — ATENOLOL 25 MG PO TABS: ORAL_TABLET | ORAL | 1 refills | Status: DC

## 2017-12-31 NOTE — Telephone Encounter (Signed)
[facility] called pt need glucometer for accu-chek aviva meter I gave verbal for one machine

## 2018-01-05 DIAGNOSIS — J301 Allergic rhinitis due to pollen: Secondary | ICD-10-CM | POA: Diagnosis not present

## 2018-01-05 DIAGNOSIS — J3081 Allergic rhinitis due to animal (cat) (dog) hair and dander: Secondary | ICD-10-CM | POA: Diagnosis not present

## 2018-01-05 DIAGNOSIS — J3089 Other allergic rhinitis: Secondary | ICD-10-CM | POA: Diagnosis not present

## 2018-01-06 ENCOUNTER — Ambulatory Visit (INDEPENDENT_AMBULATORY_CARE_PROVIDER_SITE_OTHER): Payer: Medicare Other

## 2018-01-06 DIAGNOSIS — G4733 Obstructive sleep apnea (adult) (pediatric): Secondary | ICD-10-CM | POA: Insufficient documentation

## 2018-01-06 NOTE — Progress Notes (Signed)
95 percentile pressure 7 cwp   95th percentile leak 1.1   apnea index 2.3 /hr  apnea-hypopnea index  4.0 /hr   total days used  >4 hr 180 days  total days used <4 hr 0 days  Total compliance 100 percent  Patient does wonderful on cpap. She is 100% complianr ant apneas are well controlled. No problems or questions at this time.

## 2018-01-12 DIAGNOSIS — J301 Allergic rhinitis due to pollen: Secondary | ICD-10-CM | POA: Diagnosis not present

## 2018-01-12 DIAGNOSIS — J3081 Allergic rhinitis due to animal (cat) (dog) hair and dander: Secondary | ICD-10-CM | POA: Diagnosis not present

## 2018-01-12 DIAGNOSIS — J3089 Other allergic rhinitis: Secondary | ICD-10-CM | POA: Diagnosis not present

## 2018-01-19 ENCOUNTER — Ambulatory Visit: Payer: Self-pay | Admitting: Nurse Practitioner

## 2018-01-21 DIAGNOSIS — J301 Allergic rhinitis due to pollen: Secondary | ICD-10-CM | POA: Diagnosis not present

## 2018-01-21 DIAGNOSIS — J3089 Other allergic rhinitis: Secondary | ICD-10-CM | POA: Diagnosis not present

## 2018-01-21 DIAGNOSIS — J3081 Allergic rhinitis due to animal (cat) (dog) hair and dander: Secondary | ICD-10-CM | POA: Diagnosis not present

## 2018-01-26 DIAGNOSIS — J3081 Allergic rhinitis due to animal (cat) (dog) hair and dander: Secondary | ICD-10-CM | POA: Diagnosis not present

## 2018-01-26 DIAGNOSIS — J3089 Other allergic rhinitis: Secondary | ICD-10-CM | POA: Diagnosis not present

## 2018-01-26 DIAGNOSIS — J301 Allergic rhinitis due to pollen: Secondary | ICD-10-CM | POA: Diagnosis not present

## 2018-02-04 DIAGNOSIS — J3081 Allergic rhinitis due to animal (cat) (dog) hair and dander: Secondary | ICD-10-CM | POA: Diagnosis not present

## 2018-02-04 DIAGNOSIS — J3089 Other allergic rhinitis: Secondary | ICD-10-CM | POA: Diagnosis not present

## 2018-02-04 DIAGNOSIS — J301 Allergic rhinitis due to pollen: Secondary | ICD-10-CM | POA: Diagnosis not present

## 2018-02-05 ENCOUNTER — Encounter: Payer: Self-pay | Admitting: Nurse Practitioner

## 2018-02-05 ENCOUNTER — Ambulatory Visit (INDEPENDENT_AMBULATORY_CARE_PROVIDER_SITE_OTHER): Payer: Medicare Other | Admitting: Nurse Practitioner

## 2018-02-05 VITALS — BP 145/78 | HR 64 | Resp 16 | Ht <= 58 in | Wt 197.6 lb

## 2018-02-05 DIAGNOSIS — R300 Dysuria: Secondary | ICD-10-CM | POA: Diagnosis not present

## 2018-02-05 DIAGNOSIS — N390 Urinary tract infection, site not specified: Secondary | ICD-10-CM | POA: Insufficient documentation

## 2018-02-05 DIAGNOSIS — E039 Hypothyroidism, unspecified: Secondary | ICD-10-CM

## 2018-02-05 DIAGNOSIS — Z1231 Encounter for screening mammogram for malignant neoplasm of breast: Secondary | ICD-10-CM

## 2018-02-05 DIAGNOSIS — I10 Essential (primary) hypertension: Secondary | ICD-10-CM

## 2018-02-05 DIAGNOSIS — R319 Hematuria, unspecified: Secondary | ICD-10-CM

## 2018-02-05 DIAGNOSIS — Z1239 Encounter for other screening for malignant neoplasm of breast: Secondary | ICD-10-CM

## 2018-02-05 DIAGNOSIS — Z1211 Encounter for screening for malignant neoplasm of colon: Secondary | ICD-10-CM

## 2018-02-05 LAB — POCT URINALYSIS DIPSTICK
BILIRUBIN UA: NEGATIVE
Glucose, UA: NEGATIVE
KETONES UA: NEGATIVE
NITRITE UA: NEGATIVE
PH UA: 6 (ref 5.0–8.0)
PROTEIN UA: NEGATIVE
RBC UA: NEGATIVE
Spec Grav, UA: 1.01 (ref 1.010–1.025)
UROBILINOGEN UA: 0.2 U/dL

## 2018-02-05 MED ORDER — ATENOLOL 25 MG PO TABS: ORAL_TABLET | ORAL | 4 refills | Status: DC

## 2018-02-05 MED ORDER — AMLODIPINE BESYLATE 2.5 MG PO TABS: 2.5000 mg | ORAL_TABLET | Freq: Every day | ORAL | 4 refills | Status: DC

## 2018-02-05 MED ORDER — AMOXICILLIN-POT CLAVULANATE 875-125 MG PO TABS: 1.0000 | ORAL_TABLET | Freq: Two times a day (BID) | ORAL | 0 refills | Status: DC

## 2018-02-05 MED ORDER — LEVOTHYROXINE SODIUM 50 MCG PO TABS: 50.0000 ug | ORAL_TABLET | Freq: Every day | ORAL | 4 refills | Status: DC

## 2018-02-05 NOTE — Progress Notes (Signed)
[facility] [place], [place] [address]  Internal MEDICINE  Office Visit Note  Patient Name: Claudia Martin  [id number]  [id number]  Date of Service: 02/05/2018    Pt is here for routine follow up.   Chief Complaint  Patient presents with  . Hypertension    follow up  . Urinary Tract Infection    The patient states that she was having burning and urgency to urinate which started in late march, 2019. Was unable to be seen until today. She treated her symptoms with OTC AZO for a few days. Symptoms improved. Continues to have episodes of urgency. Will feel like she has to use the bathroom. If she waits a few minutes, she may have incontinent episode. No longer having dysuria or flank pain.      Current Medication: Outpatient Encounter Medications as of 02/05/2018  Medication Sig Note  . amLODipine (NORVASC) 2.5 MG tablet Take 1 tablet (2.5 mg total) by mouth daily.   Marland Kitchen atenolol (TENORMIN) 25 MG tablet takes 1 and 1/2 tablets twice a day.   . Blood Glucose Monitoring Suppl (ACCU-CHEK AVIVA PLUS) w/Device KIT Use as directed   . cetirizine (ZYRTEC) 10 MG tablet Take 10 mg by mouth daily.   . chlorhexidine (PERIDEX) 0.12 % solution Use as directed 5 mLs in the mouth or throat 2 (two) times daily. For 2 weeks   . conjugated estrogens (PREMARIN) vaginal cream Place 1 Applicatorful vaginally 2 (two) times daily after a meal.   . EPINEPHrine (EPIPEN 2-PAK) 0.3 mg/0.3 mL IJ SOAJ injection as needed. 07/16/2015: Received from: [place]  . fluconazole (DIFLUCAN) 150 MG tablet take 1 tablet by mouth once daily for 3 days 07/16/2015: Received from: External Pharmacy  . ketoconazole (NIZORAL) 2 % cream Apply 1 application topically daily.   Marland Kitchen levothyroxine (SYNTHROID, LEVOTHROID) 50 MCG tablet Take 1 tablet (50 mcg total) by mouth daily before breakfast.   . medroxyPROGESTERone (PROVERA) 2.5 MG tablet once daily. 07/16/2015: Received from: [place]  . metroNIDAZOLE (METROGEL) 0.75 % vaginal gel  07/16/2015: Received from: [facility]  . montelukast (SINGULAIR) 10 MG tablet Take by mouth.   . naproxen sodium (ALEVE) 220 MG tablet Take by mouth. 07/16/2015: Received from: [facility]  . nystatin cream (MYCOSTATIN) Apply 1 application topically 2 (two) times daily.   Marland Kitchen olopatadine (PATANOL) 0.1 % ophthalmic solution instill 1 drop into both eyes twice a day for ITCHY EYES 07/16/2015: Received from: External Pharmacy  . Olopatadine HCl (PATADAY) 0.2 % SOLN Place 1 drop into both eyes 2 (two) times daily.   . valACYclovir (VALTREX) 1000 MG tablet Take 1,000 mg by mouth daily. Pt can 1 tablet twice a day if flare up for 7 days.   . [DISCONTINUED] amLODipine (NORVASC) 2.5 MG tablet Take 2.5 mg by mouth daily.   . [DISCONTINUED] atenolol (TENORMIN) 25 MG tablet takes 1 and 1/2 tablets twice a day.   . [DISCONTINUED] levothyroxine (SYNTHROID, LEVOTHROID) 50 MCG tablet Take 50 mcg by mouth daily before breakfast.   . amoxicillin-clavulanate (AUGMENTIN) 875-125 MG tablet Take 1 tablet by mouth 2 (two) times daily.    No facility-administered encounter medications on file as of 02/05/2018.     Surgical History: Past Surgical History:  Procedure Laterality Date  . BREAST BIOPSY    . diverticulitis    . ECTOPIC PREGNANCY SURGERY    . fx thumb    . HERNIA  REPAIR      Medical History: Past Medical History:  Diagnosis Date  . Abdominal pain   . Allergic rhinitis   . Candidiasis of vulva and vagina   . Cellulitis and abscess   . Diverticulosis   . Dysuria   . Eyelid inflammation   . Hematuria syndrome   . Hemorrhoids   . Herpes genitalis   . Herpes simplex   . HTN (hypertension)   . Hydronephrosis   . Hypothyroidism   . Incomplete bladder emptying   . Internal hordeolum   . Knee pain   . Otalgia   . Primary ovarian failure   . Sciatica   . Shortness of breath   .  Urethral diverticulum   . UTI (lower urinary tract infection)   . Vaginal atrophy   . Vaginitis     Family History: Family History  Problem Relation Age of Onset  . Tuberculosis Mother   . Nephrolithiasis Brother     Social History   Socioeconomic History  . Marital status: Divorced    Spouse name: Not on file  . Number of children: Not on file  . Years of education: Not on file  . Highest education level: Not on file  Occupational History  . Not on file  Social Needs  . Financial resource strain: Not on file  . Food insecurity:    Worry: Not on file    Inability: Not on file  . Transportation needs:    Medical: Not on file    Non-medical: Not on file  Tobacco Use  . Smoking status: Never Smoker  . Smokeless tobacco: Never Used  Substance and Sexual Activity  . Alcohol use: No  . Drug use: Never  . Sexual activity: Not on file  Lifestyle  . Physical activity:    Days per week: Not on file    Minutes per session: Not on file  . Stress: Not on file  Relationships  . Social connections:    Talks on phone: Not on file    Gets together: Not on file    Attends religious service: Not on file    Active member of club or organization: Not on file    Attends meetings of clubs or organizations: Not on file    Relationship status: Not on file  . Intimate partner violence:    Fear of current or ex partner: Not on file    Emotionally abused: Not on file    Physically abused: Not on file    Forced sexual activity: Not on file  Other Topics Concern  . Not on file  Social History Narrative  . Not on file      Review of Systems  Constitutional: Negative for activity change, chills, fatigue and unexpected weight change.  HENT: Negative for congestion, postnasal drip, rhinorrhea, sneezing, sore throat and voice change.   Eyes: Negative.  Negative for redness.  Respiratory: Negative for cough, chest tightness, shortness of breath and wheezing.   Cardiovascular:  Negative for chest pain and palpitations.  Gastrointestinal: Negative for abdominal pain, constipation, diarrhea, nausea and vomiting.  Endocrine: Negative for cold intolerance, heat intolerance, polydipsia, polyphagia and polyuria.  Genitourinary: Positive for dysuria, frequency and urgency.  Musculoskeletal: Positive for arthralgias. Negative for back pain, joint swelling and neck pain.       Bilateral knee pain  Skin: Negative for rash.  Allergic/Immunologic: Positive for environmental allergies.  Neurological: Negative for tremors, numbness and headaches.  Hematological: Negative for adenopathy. Does  not bruise/bleed easily.  Psychiatric/Behavioral: Negative for behavioral problems (Depression), sleep disturbance and suicidal ideas. The patient is not nervous/anxious.     Today's Vitals   02/05/18 1043  BP: (!) 145/78  Pulse: 64  Resp: 16  SpO2: 96%  Weight: 197 lb 9.6 oz (89.6 kg)  Height: 4' 7"  (1.397 m)    Physical Exam  Constitutional: She is oriented to person, place, and time. She appears well-developed and well-nourished. No distress.  HENT:  Head: Normocephalic and atraumatic.  Mouth/Throat: Oropharynx is clear and moist. No oropharyngeal exudate.  Eyes: Pupils are equal, round, and reactive to light. EOM are normal.  Neck: Normal range of motion. Neck supple. No JVD present. Carotid bruit is not present. No tracheal deviation present. No thyromegaly present.  Cardiovascular: Normal rate, regular rhythm and normal heart sounds. Exam reveals no gallop and no friction rub.  No murmur heard. Pulmonary/Chest: Effort normal. No respiratory distress. She has no wheezes. She has no rales. She exhibits no tenderness.  Abdominal: Soft. Bowel sounds are normal.  Genitourinary:  Genitourinary Comments: Urine sample positive for moderate WBC  Musculoskeletal:  Bilateral knee tenderness. Using cane to help with mobility.   Lymphadenopathy:    She has no cervical adenopathy.   Neurological: She is alert and oriented to person, place, and time. No cranial nerve deficit.  Skin: Skin is warm and dry. She is not diaphoretic.  Psychiatric: She has a normal mood and affect. Her behavior is normal. Judgment and thought content normal.  Nursing note and vitals reviewed.  Assessment/Plan: 1. Urinary tract infection without hematuria, site unspecified U/a showing moderate WBC. Treat with augmentin for 7 days. Adjust meds based on urine culture and sensitivity. - amoxicillin-clavulanate (AUGMENTIN) 875-125 MG tablet; Take 1 tablet by mouth 2 (two) times daily.  Dispense: 14 tablet; Refill: 0  2. Acquired hypothyroidism - levothyroxine (SYNTHROID, LEVOTHROID) 50 MCG tablet; Take 1 tablet (50 mcg total) by mouth daily before breakfast.  Dispense: 90 tablet; Refill: 4  3. Essential hypertension Generally stable - atenolol (TENORMIN) 25 MG tablet; takes 1 and 1/2 tablets twice a day.  Dispense: 180 tablet; Refill: 4 - amLODipine (NORVASC) 2.5 MG tablet; Take 1 tablet (2.5 mg total) by mouth daily.  Dispense: 90 tablet; Refill: 4  4. Screening for breast cancer - MM DIGITAL SCREENING BILATERAL; Future  5. Screening for colon cancer - Ambulatory referral to Gastroenterology  6. Dysuria - POCT Urinalysis Dipstick - CULTURE, URINE COMPREHENSIVE  General Counseling: Geena verbalizes understanding of the findings of todays visit and agrees with plan of treatment. I have discussed any further diagnostic evaluation that may be needed or ordered today. We also reviewed her medications today. she has been encouraged to call the office with any questions or concerns that should arise related to todays visit.  Hypertension Counseling:   The following hypertensive lifestyle modification were recommended and discussed:  1. Limiting alcohol intake to less than 1 oz/day of ethanol:(24 oz of beer or 8 oz of wine or 2 oz of 100-proof whiskey). 2. Take baby ASA 81 mg daily. 3.  Importance of regular aerobic exercise and losing weight. 4. Reduce dietary saturated fat and cholesterol intake for overall cardiovascular health. 5. Maintaining adequate dietary potassium, calcium, and magnesium intake. 6. Regular monitoring of the blood pressure. 7. Reduce sodium intake to less than 100 mmol/day (less than 2.3 gm of sodium or less than 6 gm of sodium choride)   This patient was seen by Leretha Pol, FNP- C  in Collaboration with Dr Lavera Guise as a part of collaborative care agreement    Orders Placed This Encounter  Procedures  . CULTURE, URINE COMPREHENSIVE  . MM DIGITAL SCREENING BILATERAL  . Ambulatory referral to Gastroenterology  . POCT Urinalysis Dipstick    Meds ordered this encounter  Medications  . atenolol (TENORMIN) 25 MG tablet    Sig: takes 1 and 1/2 tablets twice a day.    Dispense:  180 tablet    Refill:  4    Order Specific Question:   Supervising Provider    Answer:   Lavera Guise [[id number]]  . levothyroxine (SYNTHROID, LEVOTHROID) 50 MCG tablet    Sig: Take 1 tablet (50 mcg total) by mouth daily before breakfast.    Dispense:  90 tablet    Refill:  4    Order Specific Question:   Supervising Provider    Answer:   Lavera Guise [place]  . amLODipine (NORVASC) 2.5 MG tablet    Sig: Take 1 tablet (2.5 mg total) by mouth daily.    Dispense:  90 tablet    Refill:  4    Order Specific Question:   Supervising Provider    Answer:   Lavera Guise [[id number]]  . amoxicillin-clavulanate (AUGMENTIN) 875-125 MG tablet    Sig: Take 1 tablet by mouth 2 (two) times daily.    Dispense:  14 tablet    Refill:  0    Order Specific Question:   Supervising Provider    Answer:   Lavera Guise [[id number]]    Time spent: 41 Minutes          Dr Lavera Guise Internal medicine

## 2018-02-09 DIAGNOSIS — J301 Allergic rhinitis due to pollen: Secondary | ICD-10-CM | POA: Diagnosis not present

## 2018-02-09 DIAGNOSIS — J3081 Allergic rhinitis due to animal (cat) (dog) hair and dander: Secondary | ICD-10-CM | POA: Diagnosis not present

## 2018-02-09 DIAGNOSIS — J3089 Other allergic rhinitis: Secondary | ICD-10-CM | POA: Diagnosis not present

## 2018-02-09 LAB — CULTURE, URINE COMPREHENSIVE

## 2018-02-16 DIAGNOSIS — J3089 Other allergic rhinitis: Secondary | ICD-10-CM | POA: Diagnosis not present

## 2018-02-16 DIAGNOSIS — J301 Allergic rhinitis due to pollen: Secondary | ICD-10-CM | POA: Diagnosis not present

## 2018-02-16 DIAGNOSIS — J3081 Allergic rhinitis due to animal (cat) (dog) hair and dander: Secondary | ICD-10-CM | POA: Diagnosis not present

## 2018-02-23 DIAGNOSIS — Z1231 Encounter for screening mammogram for malignant neoplasm of breast: Secondary | ICD-10-CM | POA: Diagnosis not present

## 2018-02-23 DIAGNOSIS — J3089 Other allergic rhinitis: Secondary | ICD-10-CM | POA: Diagnosis not present

## 2018-02-23 DIAGNOSIS — J3081 Allergic rhinitis due to animal (cat) (dog) hair and dander: Secondary | ICD-10-CM | POA: Diagnosis not present

## 2018-02-23 DIAGNOSIS — J301 Allergic rhinitis due to pollen: Secondary | ICD-10-CM | POA: Diagnosis not present

## 2018-02-24 ENCOUNTER — Other Ambulatory Visit: Payer: Self-pay

## 2018-02-24 DIAGNOSIS — Z1211 Encounter for screening for malignant neoplasm of colon: Secondary | ICD-10-CM

## 2018-03-02 DIAGNOSIS — J301 Allergic rhinitis due to pollen: Secondary | ICD-10-CM | POA: Diagnosis not present

## 2018-03-02 DIAGNOSIS — J3089 Other allergic rhinitis: Secondary | ICD-10-CM | POA: Diagnosis not present

## 2018-03-02 DIAGNOSIS — J3081 Allergic rhinitis due to animal (cat) (dog) hair and dander: Secondary | ICD-10-CM | POA: Diagnosis not present

## 2018-03-09 DIAGNOSIS — J3089 Other allergic rhinitis: Secondary | ICD-10-CM | POA: Diagnosis not present

## 2018-03-09 DIAGNOSIS — J301 Allergic rhinitis due to pollen: Secondary | ICD-10-CM | POA: Diagnosis not present

## 2018-03-09 DIAGNOSIS — J3081 Allergic rhinitis due to animal (cat) (dog) hair and dander: Secondary | ICD-10-CM | POA: Diagnosis not present

## 2018-03-11 DIAGNOSIS — J301 Allergic rhinitis due to pollen: Secondary | ICD-10-CM | POA: Diagnosis not present

## 2018-03-11 DIAGNOSIS — J3089 Other allergic rhinitis: Secondary | ICD-10-CM | POA: Diagnosis not present

## 2018-03-11 DIAGNOSIS — J3081 Allergic rhinitis due to animal (cat) (dog) hair and dander: Secondary | ICD-10-CM | POA: Diagnosis not present

## 2018-03-12 ENCOUNTER — Encounter: Payer: Self-pay | Admitting: *Deleted

## 2018-03-15 ENCOUNTER — Ambulatory Visit: Payer: Medicare Other | Admitting: Anesthesiology

## 2018-03-15 ENCOUNTER — Encounter: Payer: Self-pay | Admitting: *Deleted

## 2018-03-15 ENCOUNTER — Encounter
Admission: RE | Disposition: A | Discharge status: Home / Self Care | Payer: Self-pay | Source: Ambulatory Visit | Attending: Gastroenterology

## 2018-03-15 ENCOUNTER — Ambulatory Visit
Admission: RE | Admit: 2018-03-15 | Discharge: 2018-03-15 | Disposition: A | Discharge status: Home / Self Care | Payer: Medicare Other | Source: Ambulatory Visit | Attending: Gastroenterology | Admitting: Gastroenterology

## 2018-03-15 DIAGNOSIS — D122 Benign neoplasm of ascending colon: Secondary | ICD-10-CM

## 2018-03-15 DIAGNOSIS — E039 Hypothyroidism, unspecified: Secondary | ICD-10-CM | POA: Insufficient documentation

## 2018-03-15 DIAGNOSIS — Z980 Intestinal bypass and anastomosis status: Secondary | ICD-10-CM | POA: Diagnosis not present

## 2018-03-15 DIAGNOSIS — I10 Essential (primary) hypertension: Secondary | ICD-10-CM | POA: Insufficient documentation

## 2018-03-15 DIAGNOSIS — K5730 Diverticulosis of large intestine without perforation or abscess without bleeding: Secondary | ICD-10-CM | POA: Insufficient documentation

## 2018-03-15 DIAGNOSIS — Z79899 Other long term (current) drug therapy: Secondary | ICD-10-CM | POA: Insufficient documentation

## 2018-03-15 DIAGNOSIS — Z79890 Hormone replacement therapy (postmenopausal): Secondary | ICD-10-CM | POA: Insufficient documentation

## 2018-03-15 DIAGNOSIS — K5790 Diverticulosis of intestine, part unspecified, without perforation or abscess without bleeding: Secondary | ICD-10-CM | POA: Diagnosis not present

## 2018-03-15 DIAGNOSIS — Z6841 Body Mass Index (BMI) 40.0 and over, adult: Secondary | ICD-10-CM | POA: Insufficient documentation

## 2018-03-15 DIAGNOSIS — Z791 Long term (current) use of non-steroidal anti-inflammatories (NSAID): Secondary | ICD-10-CM | POA: Insufficient documentation

## 2018-03-15 DIAGNOSIS — K649 Unspecified hemorrhoids: Secondary | ICD-10-CM | POA: Diagnosis not present

## 2018-03-15 DIAGNOSIS — Z86010 Personal history of colonic polyps: Secondary | ICD-10-CM | POA: Diagnosis not present

## 2018-03-15 DIAGNOSIS — Z1211 Encounter for screening for malignant neoplasm of colon: Secondary | ICD-10-CM | POA: Insufficient documentation

## 2018-03-15 DIAGNOSIS — D125 Benign neoplasm of sigmoid colon: Secondary | ICD-10-CM | POA: Insufficient documentation

## 2018-03-15 DIAGNOSIS — D120 Benign neoplasm of cecum: Secondary | ICD-10-CM | POA: Insufficient documentation

## 2018-03-15 DIAGNOSIS — D124 Benign neoplasm of descending colon: Secondary | ICD-10-CM | POA: Diagnosis not present

## 2018-03-15 DIAGNOSIS — Z1239 Encounter for other screening for malignant neoplasm of breast: Secondary | ICD-10-CM

## 2018-03-15 DIAGNOSIS — K648 Other hemorrhoids: Secondary | ICD-10-CM | POA: Diagnosis not present

## 2018-03-15 DIAGNOSIS — K635 Polyp of colon: Secondary | ICD-10-CM | POA: Diagnosis not present

## 2018-03-15 HISTORY — PX: COLONOSCOPY WITH PROPOFOL: SHX5780

## 2018-03-15 SURGERY — COLONOSCOPY WITH PROPOFOL
Anesthesia: General

## 2018-03-15 MED ORDER — PROPOFOL 10 MG/ML IV BOLUS
INTRAVENOUS | Status: DC | PRN
  Administered 2018-03-15: 90 mg via INTRAVENOUS

## 2018-03-15 MED ORDER — SODIUM CHLORIDE 0.9 % IV SOLN
INTRAVENOUS | Status: DC
  Administered 2018-03-15: 1000 mL via INTRAVENOUS

## 2018-03-15 MED ORDER — PROPOFOL 500 MG/50ML IV EMUL
INTRAVENOUS | Status: DC | PRN
  Administered 2018-03-15: 110 ug/kg/min via INTRAVENOUS

## 2018-03-15 MED ORDER — PROPOFOL 10 MG/ML IV BOLUS
INTRAVENOUS | Status: AC
  Filled 2018-03-15: qty 20

## 2018-03-15 NOTE — Anesthesia Preprocedure Evaluation (Addendum)
Anesthesia Evaluation  Patient identified by MRN, date of birth, ID band Patient awake    Reviewed: Allergy & Precautions, H&P , NPO status , Patient's Chart, lab work & pertinent test results, reviewed documented beta blocker date and time   History of Anesthesia Complications Negative for: history of anesthetic complications  Airway Mallampati: II  TM Distance: >3 FB Neck ROM: full    Dental  (+) Dental Advidsory Given, Missing   Pulmonary neg shortness of breath, sleep apnea , neg COPD, neg recent URI,           Cardiovascular Exercise Tolerance: Good hypertension, (-) angina(-) CAD, (-) Past MI, (-) Cardiac Stents and (-) CABG (-) dysrhythmias (-) Valvular Problems/Murmurs     Neuro/Psych negative neurological ROS  negative psych ROS   GI/Hepatic negative GI ROS, Neg liver ROS,   Endo/Other  Hypothyroidism Morbid obesity  Renal/GU negative Renal ROS  negative genitourinary   Musculoskeletal   Abdominal   Peds  Hematology negative hematology ROS (+)   Anesthesia Other Findings Past Medical History: No date: Abdominal pain No date: Allergic rhinitis No date: Candidiasis of vulva and vagina No date: Cellulitis and abscess No date: Diverticulosis No date: Dysuria No date: Eyelid inflammation No date: Hematuria syndrome No date: Hemorrhoids No date: Herpes genitalis No date: Herpes simplex No date: HTN (hypertension) No date: Hydronephrosis No date: Hypothyroidism No date: Incomplete bladder emptying No date: Internal hordeolum No date: Knee pain No date: Otalgia No date: Primary ovarian failure No date: Sciatica No date: Shortness of breath No date: Urethral diverticulum No date: UTI (lower urinary tract infection) No date: Vaginal atrophy No date: Vaginitis   Reproductive/Obstetrics negative OB ROS                            Anesthesia Physical Anesthesia Plan  ASA:  III  Anesthesia Plan: General   Post-op Pain Management:    Induction: Intravenous  PONV Risk Score and Plan: 3 and Propofol infusion  Airway Management Planned: Nasal Cannula  Additional Equipment:   Intra-op Plan:   Post-operative Plan:   Informed Consent: I have reviewed the patients History and Physical, chart, labs and discussed the procedure including the risks, benefits and alternatives for the proposed anesthesia with the patient or authorized representative who has indicated his/her understanding and acceptance.   Dental Advisory Given  Plan Discussed with: Anesthesiologist, CRNA and Surgeon  Anesthesia Plan Comments:         Anesthesia Quick Evaluation

## 2018-03-15 NOTE — Op Note (Signed)
[facility] Gastroenterology Patient Name: Claudia Martin [facility] Procedure Date: 03/15/2018 10:40 AM MRN: [id number] Account #: [id number] Date of Birth: 02-Aug-1946 Admit Type: Outpatient Age: 72 Room: [facility] ENDO ROOM 2 Gender: Female Note Status: Finalized Procedure:            Colonoscopy Indications:          High risk colon cancer surveillance: Personal history                        of colonic polyps, Last colonoscopy: April 2015 Providers:            Lin Landsman MD, MD Referring MD:         Lavera Guise, MD (Referring MD) Medicines:            Monitored Anesthesia Care Complications:        No immediate complications. Estimated blood loss: None. Procedure:            Pre-Anesthesia Assessment:                       - Prior to the procedure, a History and Physical was                        performed, and patient medications and allergies were                        reviewed. The patient is competent. The risks and                        benefits of the procedure and the sedation options and                        risks were discussed with the patient. All questions                        were answered and informed consent was obtained.                        Patient identification and proposed procedure were                        verified by the physician, the nurse, the                        anesthesiologist, the anesthetist and the technician in                        the pre-procedure area in the procedure room in the                        endoscopy suite. Mental Status Examination: alert and                        oriented. Airway Examination: normal oropharyngeal                        airway and neck mobility. Respiratory Examination:                        clear to auscultation. CV Examination: normal.  Prophylactic Antibiotics: The patient does not require                        prophylactic antibiotics. Prior  Anticoagulants: The                        patient has taken no previous anticoagulant or                        antiplatelet agents. ASA Grade Assessment: III - A                        patient with severe systemic disease. After reviewing                        the risks and benefits, the patient was deemed in                        satisfactory condition to undergo the procedure. The                        anesthesia plan was to use monitored anesthesia care                        (MAC). Immediately prior to administration of                        medications, the patient was re-assessed for adequacy                        to receive sedatives. The heart rate, respiratory rate,                        oxygen saturations, blood pressure, adequacy of                        pulmonary ventilation, and response to care were                        monitored throughout the procedure. The physical status                        of the patient was re-assessed after the procedure.                       After obtaining informed consent, the colonoscope was                        passed under direct vision. Throughout the procedure,                        the patient's blood pressure, pulse, and oxygen                        saturations were monitored continuously. The [place] (S#: [id number]) was introduced through  the anus and advanced to the the cecum, identified by                        appendiceal orifice and ileocecal valve. The                        colonoscopy was performed without difficulty. The                        patient tolerated the procedure well. The quality of                        the bowel preparation was adequate to identify polyps 6                        mm and larger in size. Findings:      The perianal and digital rectal examinations were normal. Pertinent       negatives include normal sphincter tone and  no palpable rectal lesions.      A 9 mm polyp was found in the cecum. The polyp was flat. Preparations       were made for mucosal resection. Saline was injected to raise the       lesion. Snare mucosal resection was performed. Resection and retrieval       were complete.      A 5 mm polyp was found in the cecum. The polyp was sessile. The polyp       was removed with a cold snare. Resection and retrieval were complete.      A 4 mm polyp was found in the ascending colon. The polyp was sessile.       The polyp was removed with a cold snare. Resection was complete, but the       polyp tissue was not retrieved.      Two sessile polyps were found in the descending colon. The polyps were 7       to 10 mm in size. These polyps were removed with a hot snare. Resection       and retrieval were complete.      A 7 mm polyp was found in the sigmoid colon. The polyp was sessile. The       polyp was removed with a hot snare. Resection and retrieval were       complete.      A 4 mm polyp was found in the sigmoid colon. The polyp was sessile. The       polyp was removed with a cold snare. Resection and retrieval were       complete.      There was evidence of a prior end-to-side colo-colonic anastomosis in       the sigmoid colon. This was patent and was characterized by healthy       appearing mucosa. The anastomosis was traversed.      Multiple diverticula were found in the sigmoid colon and ascending       colon. There was no evidence of diverticular bleeding.      Non-bleeding internal hemorrhoids were found during retroflexion. The       hemorrhoids were large. Impression:           - One 9 mm polyp in the cecum, removed with mucosal  resection. Resected and retrieved.                       - One 5 mm polyp in the cecum, removed with a cold                        snare. Resected and retrieved.                       - One 4 mm polyp in the ascending colon, removed with a                         cold snare. Complete resection. Polyp tissue not                        retrieved.                       - Two 7 to 10 mm polyps in the descending colon,                        removed with a hot snare. Resected and retrieved.                       - One 7 mm polyp in the sigmoid colon, removed with a                        hot snare. Resected and retrieved.                       - One 4 mm polyp in the sigmoid colon, removed with a                        cold snare. Resected and retrieved.                       - Patent end-to-side colo-colonic anastomosis,                        characterized by healthy appearing mucosa.                       - Severe diverticulosis in the sigmoid colon and in the                        ascending colon. There was no evidence of diverticular                        bleeding.                       - Non-bleeding internal hemorrhoids.                       - Mucosal resection was performed. Resection and                        retrieval were complete. Recommendation:       - Discharge patient to home.                       -  Resume previous diet today.                       - Continue present medications.                       - Await pathology results.                       - Repeat colonoscopy in 3 years for surveillance of                        multiple polyps. Procedure Code(s):    --- Professional ---                       [phone], 59, Colonoscopy, flexible; with endoscopic                        mucosal resection                       [phone], Colonoscopy, flexible; with removal of tumor(s),                        polyp(s), or other lesion(s) by snare technique Diagnosis Code(s):    --- Professional ---                       Z86.010, Personal history of colonic polyps                       D12.0, Benign neoplasm of cecum                       D12.2, Benign neoplasm of ascending colon                       D12.5, Benign neoplasm of  sigmoid colon                       D12.4, Benign neoplasm of descending colon                       Z98.0, Intestinal bypass and anastomosis status                       K57.30, Diverticulosis of large intestine without                        perforation or abscess without bleeding                       K64.8, Other hemorrhoids CPT copyright 2017 American Medical Association. All rights reserved. The codes documented in this report are preliminary and upon coder review may  be revised to meet current compliance requirements. Dr. Ulyess Mort Lin Landsman MD, MD 03/15/2018 11:23:37 AM This report has been signed electronically. Number of Addenda: 0 Note Initiated On: 03/15/2018 10:40 AM Scope Withdrawal Time: 0 hours 26 minutes 50 seconds  Total Procedure Duration: 0 hours 29 minutes 5 seconds       [facility]

## 2018-03-15 NOTE — H&P (Signed)
Cephas Darby, MD [address]  [place]  [place], [place] [address]  Main: [phone]  Fax: [phone] Pager: [phone]  Primary Care Physician:  Lavera Guise, MD Primary Gastroenterologist:  Dr. Cephas Darby  Pre-Procedure History & Physical: HPI:  Claudia Martin is a 72 y.o. female is here for an colonoscopy.   Past Medical History:  Diagnosis Date  . Abdominal pain   . Allergic rhinitis   . Candidiasis of vulva and vagina   . Cellulitis and abscess   . Diverticulosis   . Dysuria   . Eyelid inflammation   . Hematuria syndrome   . Hemorrhoids   . Herpes genitalis   . Herpes simplex   . HTN (hypertension)   . Hydronephrosis   . Hypothyroidism   . Incomplete bladder emptying   . Internal hordeolum   . Knee pain   . Otalgia   . Primary ovarian failure   . Sciatica   . Shortness of breath   . Urethral diverticulum   . UTI (lower urinary tract infection)   . Vaginal atrophy   . Vaginitis     Past Surgical History:  Procedure Laterality Date  . BREAST BIOPSY    . COLON SURGERY    . diverticulitis    . ECTOPIC PREGNANCY SURGERY    . fx thumb    . HERNIA REPAIR      Prior to Admission medications   Medication Sig Start Date End Date Taking? Authorizing Provider  amLODipine (NORVASC) 2.5 MG tablet Take 1 tablet (2.5 mg total) by mouth daily. 02/05/18  Yes Boscia, Heather E, NP  atenolol (TENORMIN) 25 MG tablet takes 1 and 1/2 tablets twice a day. 02/05/18  Yes Ronnell Freshwater, NP  Blood Glucose Monitoring Suppl (ACCU-CHEK AVIVA PLUS) w/Device KIT Use as directed 12/31/17  Yes Boscia, Heather E, NP  cetirizine (ZYRTEC) 10 MG tablet Take 10 mg by mouth daily.   Yes [provider]  chlorhexidine (PERIDEX) 0.12 % solution Use as directed 5 mLs in the mouth or throat 2 (two) times daily. For 2 weeks   Yes [provider]  conjugated estrogens (PREMARIN) vaginal cream Place 1 Applicatorful vaginally 2 (two) times daily after a  meal.   Yes [provider]  ketoconazole (NIZORAL) 2 % cream Apply 1 application topically daily.   Yes [provider]  levothyroxine (SYNTHROID, LEVOTHROID) 50 MCG tablet Take 1 tablet (50 mcg total) by mouth daily before breakfast. 02/05/18  Yes Boscia, Heather E, NP  montelukast (SINGULAIR) 10 MG tablet Take by mouth. 12/31/16  Yes [provider]  naproxen sodium (ALEVE) 220 MG tablet Take by mouth.   Yes [provider]  olopatadine (PATANOL) 0.1 % ophthalmic solution instill 1 drop into both eyes twice a day for ITCHY EYES 06/13/15  Yes [provider]  Olopatadine HCl (PATADAY) 0.2 % SOLN Place 1 drop into both eyes 2 (two) times daily.   Yes [provider]  valACYclovir (VALTREX) 1000 MG tablet Take 1,000 mg by mouth daily. Pt can 1 tablet twice a day if flare up for 7 days.   Yes [provider]  amoxicillin-clavulanate (AUGMENTIN) 875-125 MG tablet Take 1 tablet by mouth 2 (two) times daily. Patient not taking: Reported on 03/15/2018 02/05/18   Ronnell Freshwater, NP  EPINEPHrine (EPIPEN 2-PAK) 0.3 mg/0.3 mL IJ SOAJ injection as needed. 08/24/14   [provider]  fluconazole (DIFLUCAN) 150 MG tablet take 1 tablet by  mouth once daily for 3 days 06/27/15   [provider]  medroxyPROGESTERone (PROVERA) 2.5 MG tablet once daily. 09/12/14   [provider]  metroNIDAZOLE (METROGEL) 0.75 % vaginal gel  10/02/14   [provider]  nystatin cream (MYCOSTATIN) Apply 1 application topically 2 (two) times daily.    [provider]    Allergies as of 02/24/2018 - Review Complete 02/05/2018  Allergen Reaction Noted  . Other Diarrhea and Hives 06/26/2015  . Ibuprofen  07/16/2015  . Ivp dye [iodinated diagnostic agents] Diarrhea and Hives 06/26/2015  . Prednisone Nausea Only 06/26/2015    Family History  Problem Relation Age of Onset  . Tuberculosis Mother   . Nephrolithiasis Brother      Social History   Socioeconomic History  . Marital status: Divorced    Spouse name: Not on file  . Number of children: Not on file  . Years of education: Not on file  . Highest education level: Not on file  Occupational History  . Not on file  Social Needs  . Financial resource strain: Not on file  . Food insecurity:    Worry: Not on file    Inability: Not on file  . Transportation needs:    Medical: Not on file    Non-medical: Not on file  Tobacco Use  . Smoking status: Never Smoker  . Smokeless tobacco: Never Used  Substance and Sexual Activity  . Alcohol use: No  . Drug use: Never  . Sexual activity: Not on file  Lifestyle  . Physical activity:    Days per week: Not on file    Minutes per session: Not on file  . Stress: Not on file  Relationships  . Social connections:    Talks on phone: Not on file    Gets together: Not on file    Attends religious service: Not on file    Active member of club or organization: Not on file    Attends meetings of clubs or organizations: Not on file    Relationship status: Not on file  . Intimate partner violence:    Fear of current or ex partner: Not on file    Emotionally abused: Not on file    Physically abused: Not on file    Forced sexual activity: Not on file  Other Topics Concern  . Not on file  Social History Narrative  . Not on file    Review of Systems: See HPI, otherwise negative ROS  Physical Exam: BP (!) 136/95   Pulse 88   Temp (!) 96.5 F (35.8 C) (Tympanic)   Resp 18   Ht _0  (1.397 m)   Wt 197 lb (89.4 kg)   SpO2 95%   BMI 45.79 kg/m  General:   Alert,  pleasant and cooperative in NAD Head:  Normocephalic and atraumatic. Neck:  Supple; no masses or thyromegaly. Lungs:  Clear throughout to auscultation.    Heart:  Regular rate and rhythm. Abdomen:  Soft, nontender and nondistended. Normal bowel sounds, without guarding, and without rebound.   Neurologic:  Alert and  oriented x4;  grossly  normal neurologically.  Impression/Plan: Claudia Martin is here for an colonoscopy to be performed for personal h/o colon polyps  Risks, benefits, limitations, and alternatives regarding  colonoscopy have been reviewed with the patient.  Questions have been answered.  All parties agreeable.   Sherri Sear, MD  03/15/2018, 10:31 AM

## 2018-03-15 NOTE — Anesthesia Postprocedure Evaluation (Signed)
Anesthesia Post Note  Patient: Claudia Martin  Procedure(s) Performed: COLONOSCOPY WITH PROPOFOL (N/A )  Patient location during evaluation: Endoscopy Anesthesia Type: General Level of consciousness: awake and alert Pain management: pain level controlled Vital Signs Assessment: post-procedure vital signs reviewed and stable Respiratory status: spontaneous breathing, nonlabored ventilation, respiratory function stable and patient connected to nasal cannula oxygen Cardiovascular status: blood pressure returned to baseline and stable Postop Assessment: no apparent nausea or vomiting Anesthetic complications: no     Last Vitals:  Vitals:   03/15/18 1140 03/15/18 1150  BP: (!) 143/92 (!) 157/75  Pulse: 62 (!) 52  Resp: 19 16  Temp:    SpO2: 100% 100%    Last Pain:  Vitals:   03/15/18 1120  TempSrc: Tympanic  PainSc:                  Martha Clan

## 2018-03-15 NOTE — Transfer of Care (Signed)
Immediate Anesthesia Transfer of Care Note  Patient: Claudia Martin  Procedure(s) Performed: COLONOSCOPY WITH PROPOFOL (N/A )  Patient Location: Endoscopy Unit  Anesthesia Type:General  Level of Consciousness: drowsy and patient cooperative  Airway & Oxygen Therapy: Patient Spontanous Breathing and Patient connected to nasal cannula oxygen  Post-op Assessment: Report given to RN and Post -op Vital signs reviewed and stable  Post vital signs: Reviewed and stable  Last Vitals:  Vitals Value Taken Time  BP 110/68 03/15/2018 11:23 AM  Temp 36.1 C 03/15/2018 11:20 AM  Pulse 70 03/15/2018 11:25 AM  Resp 18 03/15/2018 11:25 AM  SpO2 100 % 03/15/2018 11:25 AM  Vitals shown include unvalidated device data.  Last Pain:  Vitals:   03/15/18 1120  TempSrc: Tympanic  PainSc:          Complications: No apparent anesthesia complications

## 2018-03-15 NOTE — Anesthesia Post-op Follow-up Note (Signed)
Anesthesia QCDR form completed.        

## 2018-03-16 ENCOUNTER — Encounter: Payer: Self-pay | Admitting: Gastroenterology

## 2018-03-16 DIAGNOSIS — J3089 Other allergic rhinitis: Secondary | ICD-10-CM | POA: Diagnosis not present

## 2018-03-16 DIAGNOSIS — J301 Allergic rhinitis due to pollen: Secondary | ICD-10-CM | POA: Diagnosis not present

## 2018-03-16 DIAGNOSIS — J3081 Allergic rhinitis due to animal (cat) (dog) hair and dander: Secondary | ICD-10-CM | POA: Diagnosis not present

## 2018-03-16 LAB — SURGICAL PATHOLOGY

## 2018-03-23 DIAGNOSIS — J3081 Allergic rhinitis due to animal (cat) (dog) hair and dander: Secondary | ICD-10-CM | POA: Diagnosis not present

## 2018-03-23 DIAGNOSIS — J301 Allergic rhinitis due to pollen: Secondary | ICD-10-CM | POA: Diagnosis not present

## 2018-03-23 DIAGNOSIS — J3089 Other allergic rhinitis: Secondary | ICD-10-CM | POA: Diagnosis not present

## 2018-03-24 ENCOUNTER — Other Ambulatory Visit: Payer: Self-pay

## 2018-03-24 MED ORDER — VALACYCLOVIR HCL 1 G PO TABS: 1000.0000 mg | ORAL_TABLET | Freq: Every day | ORAL | 1 refills | Status: DC

## 2018-03-25 DIAGNOSIS — J301 Allergic rhinitis due to pollen: Secondary | ICD-10-CM | POA: Diagnosis not present

## 2018-03-25 DIAGNOSIS — J3081 Allergic rhinitis due to animal (cat) (dog) hair and dander: Secondary | ICD-10-CM | POA: Diagnosis not present

## 2018-03-25 DIAGNOSIS — J3089 Other allergic rhinitis: Secondary | ICD-10-CM | POA: Diagnosis not present

## 2018-03-30 DIAGNOSIS — J301 Allergic rhinitis due to pollen: Secondary | ICD-10-CM | POA: Diagnosis not present

## 2018-03-30 DIAGNOSIS — J3081 Allergic rhinitis due to animal (cat) (dog) hair and dander: Secondary | ICD-10-CM | POA: Diagnosis not present

## 2018-03-30 DIAGNOSIS — J3089 Other allergic rhinitis: Secondary | ICD-10-CM | POA: Diagnosis not present

## 2018-04-01 DIAGNOSIS — J3089 Other allergic rhinitis: Secondary | ICD-10-CM | POA: Diagnosis not present

## 2018-04-01 DIAGNOSIS — J3081 Allergic rhinitis due to animal (cat) (dog) hair and dander: Secondary | ICD-10-CM | POA: Diagnosis not present

## 2018-04-01 DIAGNOSIS — J301 Allergic rhinitis due to pollen: Secondary | ICD-10-CM | POA: Diagnosis not present

## 2018-04-06 DIAGNOSIS — J301 Allergic rhinitis due to pollen: Secondary | ICD-10-CM | POA: Diagnosis not present

## 2018-04-06 DIAGNOSIS — J3081 Allergic rhinitis due to animal (cat) (dog) hair and dander: Secondary | ICD-10-CM | POA: Diagnosis not present

## 2018-04-06 DIAGNOSIS — J3089 Other allergic rhinitis: Secondary | ICD-10-CM | POA: Diagnosis not present

## 2018-04-07 DIAGNOSIS — G4733 Obstructive sleep apnea (adult) (pediatric): Secondary | ICD-10-CM | POA: Diagnosis not present

## 2018-04-13 DIAGNOSIS — J301 Allergic rhinitis due to pollen: Secondary | ICD-10-CM | POA: Diagnosis not present

## 2018-04-13 DIAGNOSIS — J3089 Other allergic rhinitis: Secondary | ICD-10-CM | POA: Diagnosis not present

## 2018-04-13 DIAGNOSIS — J3081 Allergic rhinitis due to animal (cat) (dog) hair and dander: Secondary | ICD-10-CM | POA: Diagnosis not present

## 2018-04-15 DIAGNOSIS — J3081 Allergic rhinitis due to animal (cat) (dog) hair and dander: Secondary | ICD-10-CM | POA: Diagnosis not present

## 2018-04-15 DIAGNOSIS — J301 Allergic rhinitis due to pollen: Secondary | ICD-10-CM | POA: Diagnosis not present

## 2018-04-15 DIAGNOSIS — J3089 Other allergic rhinitis: Secondary | ICD-10-CM | POA: Diagnosis not present

## 2018-04-20 DIAGNOSIS — J3081 Allergic rhinitis due to animal (cat) (dog) hair and dander: Secondary | ICD-10-CM | POA: Diagnosis not present

## 2018-04-20 DIAGNOSIS — J3089 Other allergic rhinitis: Secondary | ICD-10-CM | POA: Diagnosis not present

## 2018-04-20 DIAGNOSIS — J301 Allergic rhinitis due to pollen: Secondary | ICD-10-CM | POA: Diagnosis not present

## 2018-04-22 DIAGNOSIS — J301 Allergic rhinitis due to pollen: Secondary | ICD-10-CM | POA: Diagnosis not present

## 2018-04-29 DIAGNOSIS — J3089 Other allergic rhinitis: Secondary | ICD-10-CM | POA: Diagnosis not present

## 2018-04-29 DIAGNOSIS — J3081 Allergic rhinitis due to animal (cat) (dog) hair and dander: Secondary | ICD-10-CM | POA: Diagnosis not present

## 2018-04-29 DIAGNOSIS — J301 Allergic rhinitis due to pollen: Secondary | ICD-10-CM | POA: Diagnosis not present

## 2018-05-04 DIAGNOSIS — J3089 Other allergic rhinitis: Secondary | ICD-10-CM | POA: Diagnosis not present

## 2018-05-04 DIAGNOSIS — J3081 Allergic rhinitis due to animal (cat) (dog) hair and dander: Secondary | ICD-10-CM | POA: Diagnosis not present

## 2018-05-04 DIAGNOSIS — J301 Allergic rhinitis due to pollen: Secondary | ICD-10-CM | POA: Diagnosis not present

## 2018-05-11 DIAGNOSIS — J3081 Allergic rhinitis due to animal (cat) (dog) hair and dander: Secondary | ICD-10-CM | POA: Diagnosis not present

## 2018-05-11 DIAGNOSIS — J301 Allergic rhinitis due to pollen: Secondary | ICD-10-CM | POA: Diagnosis not present

## 2018-05-11 DIAGNOSIS — J3089 Other allergic rhinitis: Secondary | ICD-10-CM | POA: Diagnosis not present

## 2018-05-17 ENCOUNTER — Other Ambulatory Visit: Payer: Self-pay | Admitting: Nurse Practitioner

## 2018-05-17 MED ORDER — VALACYCLOVIR HCL 1 G PO TABS: 1000.0000 mg | ORAL_TABLET | Freq: Every day | ORAL | 1 refills | Status: DC

## 2018-05-18 DIAGNOSIS — J3089 Other allergic rhinitis: Secondary | ICD-10-CM | POA: Diagnosis not present

## 2018-05-18 DIAGNOSIS — J3081 Allergic rhinitis due to animal (cat) (dog) hair and dander: Secondary | ICD-10-CM | POA: Diagnosis not present

## 2018-05-18 DIAGNOSIS — J301 Allergic rhinitis due to pollen: Secondary | ICD-10-CM | POA: Diagnosis not present

## 2018-05-25 DIAGNOSIS — J3081 Allergic rhinitis due to animal (cat) (dog) hair and dander: Secondary | ICD-10-CM | POA: Diagnosis not present

## 2018-05-25 DIAGNOSIS — J301 Allergic rhinitis due to pollen: Secondary | ICD-10-CM | POA: Diagnosis not present

## 2018-05-25 DIAGNOSIS — J3089 Other allergic rhinitis: Secondary | ICD-10-CM | POA: Diagnosis not present

## 2018-06-01 DIAGNOSIS — J3089 Other allergic rhinitis: Secondary | ICD-10-CM | POA: Diagnosis not present

## 2018-06-01 DIAGNOSIS — J3081 Allergic rhinitis due to animal (cat) (dog) hair and dander: Secondary | ICD-10-CM | POA: Diagnosis not present

## 2018-06-01 DIAGNOSIS — J301 Allergic rhinitis due to pollen: Secondary | ICD-10-CM | POA: Diagnosis not present

## 2018-06-08 DIAGNOSIS — J3081 Allergic rhinitis due to animal (cat) (dog) hair and dander: Secondary | ICD-10-CM | POA: Diagnosis not present

## 2018-06-08 DIAGNOSIS — J3089 Other allergic rhinitis: Secondary | ICD-10-CM | POA: Diagnosis not present

## 2018-06-08 DIAGNOSIS — J301 Allergic rhinitis due to pollen: Secondary | ICD-10-CM | POA: Diagnosis not present

## 2018-06-15 DIAGNOSIS — J3081 Allergic rhinitis due to animal (cat) (dog) hair and dander: Secondary | ICD-10-CM | POA: Diagnosis not present

## 2018-06-15 DIAGNOSIS — J3089 Other allergic rhinitis: Secondary | ICD-10-CM | POA: Diagnosis not present

## 2018-06-15 DIAGNOSIS — J301 Allergic rhinitis due to pollen: Secondary | ICD-10-CM | POA: Diagnosis not present

## 2018-06-21 ENCOUNTER — Ambulatory Visit (INDEPENDENT_AMBULATORY_CARE_PROVIDER_SITE_OTHER): Payer: Medicare Other | Admitting: Nurse Practitioner

## 2018-06-21 ENCOUNTER — Encounter: Payer: Self-pay | Admitting: Nurse Practitioner

## 2018-06-21 VITALS — BP 147/84 | HR 74 | Resp 16 | Ht <= 58 in | Wt 190.8 lb

## 2018-06-21 DIAGNOSIS — E538 Deficiency of other specified B group vitamins: Secondary | ICD-10-CM

## 2018-06-21 DIAGNOSIS — E039 Hypothyroidism, unspecified: Secondary | ICD-10-CM

## 2018-06-21 DIAGNOSIS — Z23 Encounter for immunization: Secondary | ICD-10-CM | POA: Diagnosis not present

## 2018-06-21 DIAGNOSIS — I10 Essential (primary) hypertension: Secondary | ICD-10-CM | POA: Diagnosis not present

## 2018-06-21 DIAGNOSIS — Z1635 Resistance to multiple antimicrobial drugs: Secondary | ICD-10-CM | POA: Diagnosis not present

## 2018-06-21 DIAGNOSIS — N390 Urinary tract infection, site not specified: Secondary | ICD-10-CM | POA: Diagnosis not present

## 2018-06-21 DIAGNOSIS — Z112 Encounter for screening for other bacterial diseases: Secondary | ICD-10-CM | POA: Diagnosis not present

## 2018-06-21 DIAGNOSIS — E559 Vitamin D deficiency, unspecified: Secondary | ICD-10-CM

## 2018-06-21 DIAGNOSIS — R300 Dysuria: Secondary | ICD-10-CM | POA: Diagnosis not present

## 2018-06-21 DIAGNOSIS — N959 Unspecified menopausal and perimenopausal disorder: Secondary | ICD-10-CM

## 2018-06-21 DIAGNOSIS — Z0001 Encounter for general adult medical examination with abnormal findings: Secondary | ICD-10-CM

## 2018-06-21 NOTE — Progress Notes (Signed)
[facility] [place], [place] [address]  Internal MEDICINE  Office Visit Note  Patient Name: Claudia Martin  [id number]  [id number]  Date of Service: 06/30/2018   Pt is here for routine health maintenance examination  Chief Complaint  Patient presents with  . Hypothyroidism  . Hypertension  . Annual Exam     The patient has noted her feet feeling cold. States that as soon as she is in air conditioning her feet feel very cold. Has not noted any color changes in extremities. Has to use multiple pairs of socks or wroap feet in a blanket to get them warm again.   Hypertension  This is a chronic problem. The current episode started more than 1 year ago. The problem is unchanged. The problem is controlled. Pertinent negatives include no chest pain, headaches, neck pain, palpitations or shortness of breath. Agents associated with hypertension include thyroid hormones and estrogens. Risk factors for coronary artery disease include dyslipidemia, obesity, post-menopausal state and sedentary lifestyle. Past treatments include beta blockers and calcium channel blockers. The current treatment provides moderate improvement. Compliance problems include exercise.      Current Medication: Outpatient Encounter Medications as of 06/21/2018  Medication Sig Note  . amLODipine (NORVASC) 2.5 MG tablet Take 1 tablet (2.5 mg total) by mouth daily.   Marland Kitchen atenolol (TENORMIN) 25 MG tablet takes 1 and 1/2 tablets twice a day.   . Blood Glucose Monitoring Suppl (ACCU-CHEK AVIVA PLUS) w/Device KIT Use as directed   . cetirizine (ZYRTEC) 10 MG tablet Take 10 mg by mouth daily.   . chlorhexidine (PERIDEX) 0.12 % solution Use as directed 5 mLs in the mouth or throat 2 (two) times daily. For 2 weeks   . conjugated estrogens (PREMARIN) vaginal cream Use 1 applicatorful twice weekly.   Marland Kitchen EPINEPHrine (EPIPEN 2-PAK) 0.3 mg/0.3 mL IJ SOAJ injection as needed. 07/16/2015: Received from:  [place]  . fluconazole (DIFLUCAN) 150 MG tablet take 1 tablet by mouth once daily for 3 days 07/16/2015: Received from: External Pharmacy  . ketoconazole (NIZORAL) 2 % cream Apply 1 application topically daily.   Marland Kitchen levothyroxine (SYNTHROID, LEVOTHROID) 50 MCG tablet Take 1 tablet (50 mcg total) by mouth daily before breakfast.   . medroxyPROGESTERone (PROVERA) 2.5 MG tablet once daily. 07/16/2015: Received from: [place]  . metroNIDAZOLE (METROGEL) 0.75 % vaginal gel  07/16/2015: Received from: [facility]  . montelukast (SINGULAIR) 10 MG tablet Take by mouth.   . naproxen sodium (ALEVE) 220 MG tablet Take by mouth. 07/16/2015: Received from: [facility]  . nystatin cream (MYCOSTATIN) Apply 1 application topically 2 (two) times daily.   Marland Kitchen olopatadine (PATANOL) 0.1 % ophthalmic solution instill 1 drop into both eyes twice a day for ITCHY EYES 07/16/2015: Received from: External Pharmacy  . Olopatadine HCl (PATADAY) 0.2 % SOLN Place 1 drop into both eyes 2 (two) times daily.   . valACYclovir (VALTREX) 1000 MG tablet Take 1 tablet (1,000 mg total) by mouth daily. Pt can 1 tablet twice a day if flare up for 7 days.   . nitrofurantoin, macrocrystal-monohydrate, (MACROBID) 100 MG capsule Take 1 capsule (100 mg total) by mouth 2 (two) times daily.   . [DISCONTINUED] conjugated estrogens (PREMARIN) vaginal cream Place 1 Applicatorful vaginally 2 (two) times daily after a meal.    No facility-administered encounter medications on file as of 06/21/2018.     Surgical History: Past Surgical History:  Procedure Laterality  Date  . BREAST BIOPSY    . COLON SURGERY    . COLONOSCOPY WITH PROPOFOL N/A 03/15/2018   Procedure: COLONOSCOPY WITH PROPOFOL;  Surgeon: Lin Landsman, MD;  Location: [facility] ENDOSCOPY;  Service: Gastroenterology;  Laterality: N/A;  . diverticulitis    . ECTOPIC PREGNANCY SURGERY    . fx thumb    . HERNIA  REPAIR      Medical History: Past Medical History:  Diagnosis Date  . Abdominal pain   . Allergic rhinitis   . Candidiasis of vulva and vagina   . Cellulitis and abscess   . Diverticulosis   . Dysuria   . Eyelid inflammation   . Hematuria syndrome   . Hemorrhoids   . Herpes genitalis   . Herpes simplex   . HTN (hypertension)   . Hydronephrosis   . Hypothyroidism   . Incomplete bladder emptying   . Internal hordeolum   . Knee pain   . Otalgia   . Primary ovarian failure   . Sciatica   . Shortness of breath   . Urethral diverticulum   . UTI (lower urinary tract infection)   . Vaginal atrophy   . Vaginitis     Family History: Family History  Problem Relation Age of Onset  . Tuberculosis Mother   . Nephrolithiasis Brother       Review of Systems  Constitutional: Negative for activity change, chills, fatigue and unexpected weight change.  HENT: Negative for congestion, postnasal drip, rhinorrhea, sneezing, sore throat and voice change.   Eyes: Negative.  Negative for redness.  Respiratory: Negative for cough, chest tightness, shortness of breath and wheezing.   Cardiovascular: Negative for chest pain and palpitations.  Gastrointestinal: Negative for abdominal pain, constipation, diarrhea, nausea and vomiting.  Endocrine: Negative for cold intolerance, heat intolerance, polydipsia, polyphagia and polyuria.       Well controlled hypothyroid.  Genitourinary: Positive for dysuria, frequency and urgency.  Musculoskeletal: Positive for arthralgias. Negative for back pain, joint swelling and neck pain.       Bilateral knee pain  Skin: Negative for rash.  Allergic/Immunologic: Positive for environmental allergies.  Neurological: Negative for tremors, numbness and headaches.  Hematological: Negative for adenopathy. Does not bruise/bleed easily.  Psychiatric/Behavioral: Negative for behavioral problems (Depression), sleep disturbance and suicidal ideas. The patient is not  nervous/anxious.      Today's Vitals   06/21/18 1152  BP: (!) 147/84  Pulse: 74  Resp: 16  SpO2: 96%  Weight: 190 lb 12.8 oz (86.5 kg)  Height: 4' 7"  (1.397 m)    Physical Exam  Constitutional: She is oriented to person, place, and time. She appears well-developed and well-nourished. No distress.  HENT:  Head: Normocephalic and atraumatic.  Nose: Nose normal.  Mouth/Throat: Oropharynx is clear and moist. No oropharyngeal exudate.  Eyes: Pupils are equal, round, and reactive to light. Conjunctivae and EOM are normal.  Neck: Normal range of motion. Neck supple. No JVD present. Carotid bruit is not present. No tracheal deviation present. No thyromegaly present.  Cardiovascular: Normal rate, regular rhythm, normal heart sounds and intact distal pulses. Exam reveals no gallop and no friction rub.  No murmur heard. Pulmonary/Chest: Effort normal and breath sounds normal. No respiratory distress. She has no wheezes. She has no rales. She exhibits no tenderness. Right breast exhibits no inverted nipple, no mass, no nipple discharge, no skin change and no tenderness. Left breast exhibits no inverted nipple, no mass, no nipple discharge, no skin change and no tenderness.  Abdominal: Soft. Bowel sounds are normal. There is no tenderness.  Musculoskeletal:  Bilateral knee tenderness. Using cane to help with mobility.   Lymphadenopathy:    She has no cervical adenopathy.  Neurological: She is alert and oriented to person, place, and time. No cranial nerve deficit.  Skin: Skin is warm and dry. Capillary refill takes less than 2 seconds. She is not diaphoretic.  Psychiatric: She has a normal mood and affect. Her behavior is normal. Judgment and thought content normal.  Nursing note and vitals reviewed.  Depression screen [facility] 2/9 06/21/2018 06/21/2018 02/05/2018  Decreased Interest 0 0 0  Down, Depressed, Hopeless 0 0 0  PHQ - 2 Score 0 0 0    Functional Status Survey: Is the patient deaf or  have difficulty hearing?: No Does the patient have difficulty seeing, even when wearing glasses/contacts?: No Does the patient have difficulty concentrating, remembering, or making decisions?: No Does the patient have difficulty dressing or bathing?: No  MMSE - Mini Mental State Exam 06/21/2018  Orientation to time 5  Orientation to Place 5  Registration 3  Attention/ Calculation 5  Recall 3  Language- name 2 objects 2  Language- repeat 1  Language- follow 3 step command 3  Language- read & follow direction 1  Write a sentence 0  Copy design 1  Total score 29    Fall Risk  06/21/2018 06/21/2018 02/05/2018  Falls in the past year? No No No     LABS: Recent Results (from the past 2160 hour(s))  UA/M w/rflx Culture, Routine     Status: Abnormal   Collection Time: 06/21/18 11:53 AM  Result Value Ref Range   Specific Gravity, UA 1.022 1.005 - 1.030   pH, UA 5.5 5.0 - 7.5   Color, UA Yellow Yellow   Appearance Ur Clear Clear   Leukocytes, UA 2+ (A) Negative   Protein, UA Trace Negative/Trace   Glucose, UA Negative Negative   Ketones, UA Trace (A) Negative   RBC, UA Negative Negative   Bilirubin, UA Negative Negative   Urobilinogen, Ur 0.2 0.2 - 1.0 mg/dL   Nitrite, UA Negative Negative   Microscopic Examination See below:     Comment: Microscopic was indicated and was performed.   Urinalysis Reflex Comment     Comment: This specimen has reflexed to a Urine Culture.  Microscopic Examination     Status: Abnormal   Collection Time: 06/21/18 11:53 AM  Result Value Ref Range   WBC, UA >30 (A) 0 - 5 /hpf   RBC, UA 0-2 0 - 2 /hpf   Epithelial Cells (non renal) >10 (A) 0 - 10 /hpf   Casts None seen None seen /lpf   Crystals Present (A) N/A   Crystal Type Calcium Oxalate N/A   Mucus, UA Present Not Estab.   Bacteria, UA Few None seen/Few  Urine Culture, Reflex     Status: Abnormal   Collection Time: 06/21/18 11:53 AM  Result Value Ref Range   Urine Culture, Routine Final  report (A)    Organism ID, Bacteria Escherichia coli (A)     Comment: Greater than 100,000 colony forming units per mL Cefazolin <=4 ug/mL Cefazolin with an MIC <=16 predicts susceptibility to the oral agents cefaclor, cefdinir, cefpodoxime, cefprozil, cefuroxime, cephalexin, and loracarbef when used for therapy of uncomplicated urinary tract infections due to E. coli, Klebsiella pneumoniae, and Proteus mirabilis.    Antimicrobial Susceptibility Comment     Comment:       **  S = Susceptible; I = Intermediate; R = Resistant **                    P = Positive; N = Negative             MICS are expressed in micrograms per mL    Antibiotic                 RSLT#1    RSLT#2    RSLT#3    RSLT#4 Amoxicillin/Clavulanic Acid    S Ampicillin                     S Cefepime                       S Ceftriaxone                    S Cefuroxime                     I Ciprofloxacin                  S Ertapenem                      S Gentamicin                     S Imipenem                       S Levofloxacin                   S Meropenem                      S Nitrofurantoin                 S Piperacillin/Tazobactam        S Tetracycline                   S Tobramycin                     S Trimethoprim/Sulfa             S   Comprehensive metabolic panel     Status: Abnormal   Collection Time: 06/22/18  1:35 PM  Result Value Ref Range   Glucose 105 (H) 65 - 99 mg/dL   BUN 15 8 - 27 mg/dL   Creatinine, Ser 0.86 0.57 - 1.00 mg/dL   GFR calc non Af Amer 68 >59 mL/min/1.73   GFR calc Af Amer 79 >59 mL/min/1.73   BUN/Creatinine Ratio 17 12 - 28   Sodium 142 134 - 144 mmol/L   Potassium 3.9 3.5 - 5.2 mmol/L   Chloride 101 96 - 106 mmol/L   CO2 27 20 - 29 mmol/L   Calcium 9.3 8.7 - 10.3 mg/dL   Total Protein 7.5 6.0 - 8.5 g/dL   Albumin 4.2 3.5 - 4.8 g/dL   Globulin, Total 3.3 1.5 - 4.5 g/dL   Albumin/Globulin Ratio 1.3 1.2 - 2.2   Bilirubin Total 0.3 0.0 - 1.2 mg/dL   Alkaline Phosphatase  118 (H) 39 - 117 IU/L   AST 16 0 - 40 IU/L   ALT 12 0 - 32 IU/L  CBC     Status: None   Collection Time: 06/22/18  1:35 PM  Result Value Ref Range   WBC 6.6 3.4 - 10.8 x10E3/uL   RBC 4.56 3.77 - 5.28 x10E6/uL   Hemoglobin 13.8 11.1 - 15.9 g/dL   Hematocrit 40.5 34.0 - 46.6 %   MCV 89 79 - 97 fL   MCH 30.3 26.6 - 33.0 pg   MCHC 34.1 31.5 - 35.7 g/dL   RDW 14.3 12.3 - 15.4 %   Platelets 245 150 - 450 x10E3/uL  Lipid Panel w/o Chol/HDL Ratio     Status: Abnormal   Collection Time: 06/22/18  1:35 PM  Result Value Ref Range   Cholesterol, Total 167 100 - 199 mg/dL   Triglycerides 123 0 - 149 mg/dL   HDL 36 (L) >39 mg/dL   VLDL Cholesterol Cal 25 5 - 40 mg/dL   LDL Calculated 106 (H) 0 - 99 mg/dL  B12 and Folate Panel     Status: None   Collection Time: 06/22/18  1:35 PM  Result Value Ref Range   Vitamin B-12 264 232 - 1,245 pg/mL   Folate 3.9 >3.0 ng/mL    Comment: A serum folate concentration of less than 3.1 ng/mL is considered to represent clinical deficiency.   Hgb A1c w/o eAG     Status: Abnormal   Collection Time: 06/22/18  1:35 PM  Result Value Ref Range   Hgb A1c MFr Bld 5.8 (H) 4.8 - 5.6 %    Comment:          Prediabetes: 5.7 - 6.4          Diabetes: >6.4          Glycemic control for adults with diabetes: <7.0   T4, free     Status: None   Collection Time: 06/22/18  1:35 PM  Result Value Ref Range   Free T4 1.12 0.82 - 1.77 ng/dL  TSH     Status: None   Collection Time: 06/22/18  1:35 PM  Result Value Ref Range   TSH 1.190 0.450 - 4.500 uIU/mL  VITAMIN D 25 Hydroxy (Vit-D Deficiency, Fractures)     Status: Abnormal   Collection Time: 06/22/18  1:35 PM  Result Value Ref Range   Vit D, 25-Hydroxy 17.7 (L) 30.0 - 100.0 ng/mL    Comment: Vitamin D deficiency has been defined by the [employer] and an Endocrine Society practice guideline as a level of serum 25-OH vitamin D less than 20 ng/mL (1,2). The Endocrine Society went on to further define  vitamin D insufficiency as a level between 21 and 29 ng/mL (2). 1. IOM (Institute of Medicine). 2010. Dietary reference    intakes for calcium and D. [place]: The    [employer]. 2. Holick MF, Binkley Juneau, Bischoff-Ferrari HA, et al.    Evaluation, treatment, and prevention of vitamin D    deficiency: an Endocrine Society clinical practice    guideline. JCEM. 2011 Jul; 96(7):1911-30.     Assessment/Plan: 1. Encounter for general adult medical examination with abnormal findings Annual wellness visit today.  - UA/M w/rflx Culture, Routine - CBC with Differential/Platelet  2. Essential hypertension Stable. Continue bp medication as prescribed. Routine, fasting labs ordered.  - Comprehensive metabolic panel - Lipid panel  3. Acquired hypothyroidism Check thyroid panel and adjust levothyroxine dosing as indicated  - T4, free - TSH  4. Vitamin B12 deficiency Check anemia panel and treat for b12 deficiency as indicated . - B12 and Folate Panel  5. Unspecified menopausal and perimenopausal disorder - conjugated estrogens (  PREMARIN) vaginal cream; Use 1 applicatorful twice weekly.  Dispense: 90 g; Refill: 4  6. Vitamin D deficiency - Vitamin D 1,25 dihydroxy  7. Flu vaccine need - Flu Vaccine MDCK QUAD PF  8. Urinary tract infection without hematuria, site unspecified - nitrofurantoin, macrocrystal-monohydrate, (MACROBID) 100 MG capsule; Take 1 capsule (100 mg total) by mouth 2 (two) times daily.  Dispense: 20 capsule; Refill: 0   General Counseling: Claudia Martin verbalizes understanding of the findings of todays visit and agrees with plan of treatment. I have discussed any further diagnostic evaluation that may be needed or ordered today. We also reviewed her medications today. she has been encouraged to call the office with any questions or concerns that should arise related to todays visit.    Counseling:  Hypertension Counseling:   The following hypertensive  lifestyle modification were recommended and discussed:  1. Limiting alcohol intake to less than 1 oz/day of ethanol:(24 oz of beer or 8 oz of wine or 2 oz of 100-proof whiskey). 2. Take baby ASA 81 mg daily. 3. Importance of regular aerobic exercise and losing weight. 4. Reduce dietary saturated fat and cholesterol intake for overall cardiovascular health. 5. Maintaining adequate dietary potassium, calcium, and magnesium intake. 6. Regular monitoring of the blood pressure. 7. Reduce sodium intake to less than 100 mmol/day (less than 2.3 gm of sodium or less than 6 gm of sodium choride)   This patient was seen by [place] with Dr Lavera Guise as a part of collaborative care agreement  Orders Placed This Encounter  Procedures  . Microscopic Examination  . Urine Culture, Reflex  . Flu Vaccine MDCK QUAD PF  . UA/M w/rflx Culture, Routine  . CBC with Differential/Platelet  . Comprehensive metabolic panel  . T4, free  . TSH  . Lipid panel  . Vitamin D 1,25 dihydroxy  . B12 and Folate Panel    Meds ordered this encounter  Medications  . conjugated estrogens (PREMARIN) vaginal cream    Sig: Use 1 applicatorful twice weekly.    Dispense:  90 g    Refill:  4    Order Specific Question:   Supervising Provider    Answer:   Lavera Guise [[id number]]  . nitrofurantoin, macrocrystal-monohydrate, (MACROBID) 100 MG capsule    Sig: Take 1 capsule (100 mg total) by mouth 2 (two) times daily.    Dispense:  20 capsule    Refill:  0    Order Specific Question:   Supervising Provider    Answer:   Lavera Guise [[id number]]    Time spent: [place], MD  Internal Medicine

## 2018-06-22 ENCOUNTER — Other Ambulatory Visit: Payer: Self-pay | Admitting: Nurse Practitioner

## 2018-06-22 DIAGNOSIS — J3089 Other allergic rhinitis: Secondary | ICD-10-CM | POA: Diagnosis not present

## 2018-06-22 DIAGNOSIS — J3081 Allergic rhinitis due to animal (cat) (dog) hair and dander: Secondary | ICD-10-CM | POA: Diagnosis not present

## 2018-06-22 DIAGNOSIS — D519 Vitamin B12 deficiency anemia, unspecified: Secondary | ICD-10-CM | POA: Diagnosis not present

## 2018-06-22 DIAGNOSIS — Z0000 Encounter for general adult medical examination without abnormal findings: Secondary | ICD-10-CM | POA: Diagnosis not present

## 2018-06-22 DIAGNOSIS — J301 Allergic rhinitis due to pollen: Secondary | ICD-10-CM | POA: Diagnosis not present

## 2018-06-22 DIAGNOSIS — I10 Essential (primary) hypertension: Secondary | ICD-10-CM | POA: Diagnosis not present

## 2018-06-22 DIAGNOSIS — E559 Vitamin D deficiency, unspecified: Secondary | ICD-10-CM | POA: Diagnosis not present

## 2018-06-22 DIAGNOSIS — E039 Hypothyroidism, unspecified: Secondary | ICD-10-CM | POA: Diagnosis not present

## 2018-06-22 DIAGNOSIS — E782 Mixed hyperlipidemia: Secondary | ICD-10-CM | POA: Diagnosis not present

## 2018-06-22 MED ORDER — ESTROGENS, CONJUGATED 0.625 MG/GM VA CREA: TOPICAL_CREAM | VAGINAL | 4 refills | Status: DC

## 2018-06-23 LAB — COMPREHENSIVE METABOLIC PANEL
ALBUMIN: 4.2 g/dL (ref 3.5–4.8)
ALK PHOS: 118 IU/L — AB (ref 39–117)
ALT: 12 IU/L (ref 0–32)
AST: 16 IU/L (ref 0–40)
Albumin/Globulin Ratio: 1.3 (ref 1.2–2.2)
BILIRUBIN TOTAL: 0.3 mg/dL (ref 0.0–1.2)
BUN / CREAT RATIO: 17 (ref 12–28)
BUN: 15 mg/dL (ref 8–27)
CHLORIDE: 101 mmol/L (ref 96–106)
CO2: 27 mmol/L (ref 20–29)
CREATININE: 0.86 mg/dL (ref 0.57–1.00)
Calcium: 9.3 mg/dL (ref 8.7–10.3)
GFR calc Af Amer: 79 mL/min/{1.73_m2} (ref >59)
GFR calc non Af Amer: 68 mL/min/{1.73_m2} (ref >59)
GLUCOSE: 105 mg/dL — AB (ref 65–99)
Globulin, Total: 3.3 g/dL (ref 1.5–4.5)
Potassium: 3.9 mmol/L (ref 3.5–5.2)
Sodium: 142 mmol/L (ref 134–144)
Total Protein: 7.5 g/dL (ref 6.0–8.5)

## 2018-06-23 LAB — VITAMIN D 25 HYDROXY (VIT D DEFICIENCY, FRACTURES): VIT D 25 HYDROXY: 17.7 ng/mL — AB (ref 30.0–100.0)

## 2018-06-23 LAB — TSH: TSH: 1.19 u[IU]/mL (ref 0.450–4.500)

## 2018-06-23 LAB — LIPID PANEL W/O CHOL/HDL RATIO
Cholesterol, Total: 167 mg/dL (ref 100–199)
HDL: 36 mg/dL — ABNORMAL LOW (ref >39)
LDL Calculated: 106 mg/dL — ABNORMAL HIGH (ref 0–99)
Triglycerides: 123 mg/dL (ref 0–149)
VLDL CHOLESTEROL CAL: 25 mg/dL (ref 5–40)

## 2018-06-23 LAB — MICROSCOPIC EXAMINATION
Casts: NONE SEEN /lpf
Epithelial Cells (non renal): >10 /hpf — AB (ref 0–10)

## 2018-06-23 LAB — T4, FREE: FREE T4: 1.12 ng/dL (ref 0.82–1.77)

## 2018-06-23 LAB — UA/M W/RFLX CULTURE, ROUTINE
BILIRUBIN UA: NEGATIVE
Glucose, UA: NEGATIVE
Nitrite, UA: NEGATIVE
PH UA: 5.5 (ref 5.0–7.5)
RBC, UA: NEGATIVE
Specific Gravity, UA: 1.022 (ref 1.005–1.030)
Urobilinogen, Ur: 0.2 mg/dL (ref 0.2–1.0)

## 2018-06-23 LAB — CBC
HEMATOCRIT: 40.5 % (ref 34.0–46.6)
Hemoglobin: 13.8 g/dL (ref 11.1–15.9)
MCH: 30.3 pg (ref 26.6–33.0)
MCHC: 34.1 g/dL (ref 31.5–35.7)
MCV: 89 fL (ref 79–97)
PLATELETS: 245 10*3/uL (ref 150–450)
RBC: 4.56 x10E6/uL (ref 3.77–5.28)
RDW: 14.3 % (ref 12.3–15.4)
WBC: 6.6 10*3/uL (ref 3.4–10.8)

## 2018-06-23 LAB — URINE CULTURE, REFLEX

## 2018-06-23 LAB — B12 AND FOLATE PANEL
FOLATE: 3.9 ng/mL (ref >3.0)
Vitamin B-12: 264 pg/mL (ref 232–1245)

## 2018-06-23 LAB — HGB A1C W/O EAG: HEMOGLOBIN A1C: 5.8 % — AB (ref 4.8–5.6)

## 2018-06-24 MED ORDER — NITROFURANTOIN MONOHYD MACRO 100 MG PO CAPS: 100.0000 mg | ORAL_CAPSULE | Freq: Two times a day (BID) | ORAL | 0 refills | Status: DC

## 2018-06-25 ENCOUNTER — Telehealth: Payer: Self-pay

## 2018-06-25 NOTE — Telephone Encounter (Signed)
Pt advised urine did showed infection we send macrobid

## 2018-06-29 DIAGNOSIS — J3089 Other allergic rhinitis: Secondary | ICD-10-CM | POA: Diagnosis not present

## 2018-06-29 DIAGNOSIS — J3081 Allergic rhinitis due to animal (cat) (dog) hair and dander: Secondary | ICD-10-CM | POA: Diagnosis not present

## 2018-06-29 DIAGNOSIS — J301 Allergic rhinitis due to pollen: Secondary | ICD-10-CM | POA: Diagnosis not present

## 2018-06-30 DIAGNOSIS — E538 Deficiency of other specified B group vitamins: Secondary | ICD-10-CM | POA: Insufficient documentation

## 2018-06-30 DIAGNOSIS — E559 Vitamin D deficiency, unspecified: Secondary | ICD-10-CM | POA: Insufficient documentation

## 2018-06-30 DIAGNOSIS — Z23 Encounter for immunization: Secondary | ICD-10-CM | POA: Insufficient documentation

## 2018-06-30 DIAGNOSIS — N959 Unspecified menopausal and perimenopausal disorder: Secondary | ICD-10-CM | POA: Insufficient documentation

## 2018-07-05 ENCOUNTER — Ambulatory Visit (INDEPENDENT_AMBULATORY_CARE_PROVIDER_SITE_OTHER): Payer: Medicare Other | Admitting: Internal Medicine

## 2018-07-05 ENCOUNTER — Encounter: Payer: Self-pay | Admitting: Internal Medicine

## 2018-07-05 VITALS — BP 143/69 | HR 63 | Resp 16 | Ht <= 58 in | Wt 189.0 lb

## 2018-07-05 DIAGNOSIS — G4733 Obstructive sleep apnea (adult) (pediatric): Secondary | ICD-10-CM

## 2018-07-05 NOTE — Progress Notes (Signed)
[facility] [place], [place] [address]  Pulmonary Sleep Medicine   Office Visit Note  Patient Name: Claudia Martin DOB: 19-Dec-1945 MRN [id number]  Date of Service: 07/05/2018  Complaints/HPI: Doing well with her CPAP no issues since the last visit. No admissions. She has not been able to get weight off. She is doing well with the downloads. Has a new mask and is tolerating it better. Arthritis is about the same.  Patient has had no other major issues since the last visit to the hospital.  ROS  General: (-) fever, (-) chills, (-) night sweats, (-) weakness Skin: (-) rashes, (-) itching,. Eyes: (-) visual changes, (-) redness, (-) itching. Nose and Sinuses: (-) nasal stuffiness or itchiness, (-) postnasal drip, (-) nosebleeds, (-) sinus trouble. Mouth and Throat: (-) sore throat, (-) hoarseness. Neck: (-) swollen glands, (-) enlarged thyroid, (-) neck pain. Respiratory: - cough, (-) bloody sputum, + shortness of breath, - wheezing. Cardiovascular: - ankle swelling, (-) chest pain. Lymphatic: (-) lymph node enlargement. Neurologic: (-) numbness, (-) tingling. Psychiatric: (-) anxiety, (-) depression   Current Medication: Outpatient Encounter Medications as of 07/05/2018  Medication Sig Note  . amLODipine (NORVASC) 2.5 MG tablet Take 1 tablet (2.5 mg total) by mouth daily.   Marland Kitchen atenolol (TENORMIN) 25 MG tablet takes 1 and 1/2 tablets twice a day.   . Blood Glucose Monitoring Suppl (ACCU-CHEK AVIVA PLUS) w/Device KIT Use as directed   . cetirizine (ZYRTEC) 10 MG tablet Take 10 mg by mouth daily.   . chlorhexidine (PERIDEX) 0.12 % solution Use as directed 5 mLs in the mouth or throat 2 (two) times daily. For 2 weeks   . conjugated estrogens (PREMARIN) vaginal cream Use 1 applicatorful twice weekly.   Marland Kitchen EPINEPHrine (EPIPEN 2-PAK) 0.3 mg/0.3 mL IJ SOAJ injection as needed. 07/16/2015: Received from: [facility]  . fluconazole  (DIFLUCAN) 150 MG tablet take 1 tablet by mouth once daily for 3 days 07/16/2015: Received from: External Pharmacy  . ketoconazole (NIZORAL) 2 % cream Apply 1 application topically daily.   Marland Kitchen levothyroxine (SYNTHROID, LEVOTHROID) 50 MCG tablet Take 1 tablet (50 mcg total) by mouth daily before breakfast.   . medroxyPROGESTERone (PROVERA) 2.5 MG tablet once daily. 07/16/2015: Received from: [facility]  . metroNIDAZOLE (METROGEL) 0.75 % vaginal gel  07/16/2015: Received from: [facility]  . montelukast (SINGULAIR) 10 MG tablet Take by mouth.   . naproxen sodium (ALEVE) 220 MG tablet Take by mouth. 07/16/2015: Received from: [facility]  . nitrofurantoin, macrocrystal-monohydrate, (MACROBID) 100 MG capsule Take 1 capsule (100 mg total) by mouth 2 (two) times daily.   Marland Kitchen nystatin cream (MYCOSTATIN) Apply 1 application topically 2 (two) times daily.   Marland Kitchen olopatadine (PATANOL) 0.1 % ophthalmic solution instill 1 drop into both eyes twice a day for ITCHY EYES 07/16/2015: Received from: External Pharmacy  . Olopatadine HCl (PATADAY) 0.2 % SOLN Place 1 drop into both eyes 2 (two) times daily.   . valACYclovir (VALTREX) 1000 MG tablet Take 1 tablet (1,000 mg total) by mouth daily. Pt can 1 tablet twice a day if flare up for 7 days.   . [DISCONTINUED] conjugated estrogens (PREMARIN) vaginal cream Place 1 Applicatorful vaginally 2 (two) times daily after a meal.    No facility-administered encounter medications on file as of 07/05/2018.     Surgical History: Past Surgical History:  Procedure Laterality Date  . BREAST BIOPSY    .  COLON SURGERY    . COLONOSCOPY WITH PROPOFOL N/A 03/15/2018   Procedure: COLONOSCOPY WITH PROPOFOL;  Surgeon: Lin Landsman, MD;  Location: [facility] ENDOSCOPY;  Service: Gastroenterology;  Laterality: N/A;  . diverticulitis    . ECTOPIC PREGNANCY SURGERY    . fx thumb    . HERNIA REPAIR      Medical History: Past Medical  History:  Diagnosis Date  . Abdominal pain   . Allergic rhinitis   . Candidiasis of vulva and vagina   . Cellulitis and abscess   . Diverticulosis   . Dysuria   . Eyelid inflammation   . Hematuria syndrome   . Hemorrhoids   . Herpes genitalis   . Herpes simplex   . HTN (hypertension)   . Hydronephrosis   . Hypothyroidism   . Incomplete bladder emptying   . Internal hordeolum   . Knee pain   . Otalgia   . Primary ovarian failure   . Sciatica   . Shortness of breath   . Urethral diverticulum   . UTI (lower urinary tract infection)   . Vaginal atrophy   . Vaginitis     Family History: Family History  Problem Relation Age of Onset  . Tuberculosis Mother   . Nephrolithiasis Brother     Social History: Social History   Socioeconomic History  . Marital status: Divorced    Spouse name: Not on file  . Number of children: Not on file  . Years of education: Not on file  . Highest education level: Not on file  Occupational History  . Not on file  Social Needs  . Financial resource strain: Not on file  . Food insecurity:    Worry: Not on file    Inability: Not on file  . Transportation needs:    Medical: Not on file    Non-medical: Not on file  Tobacco Use  . Smoking status: Never Smoker  . Smokeless tobacco: Never Used  Substance and Sexual Activity  . Alcohol use: No  . Drug use: Never  . Sexual activity: Not on file  Lifestyle  . Physical activity:    Days per week: Not on file    Minutes per session: Not on file  . Stress: Not on file  Relationships  . Social connections:    Talks on phone: Not on file    Gets together: Not on file    Attends religious service: Not on file    Active member of club or organization: Not on file    Attends meetings of clubs or organizations: Not on file    Relationship status: Not on file  . Intimate partner violence:    Fear of current or ex partner: Not on file    Emotionally abused: Not on file    Physically  abused: Not on file    Forced sexual activity: Not on file  Other Topics Concern  . Not on file  Social History Narrative  . Not on file    Vital Signs: Blood pressure (!) 143/69, pulse 63, resp. rate 16, height _0  (1.397 m), weight 189 lb (85.7 kg), SpO2 94 %.  Examination: General Appearance: The patient is well-developed, well-nourished, and in no distress. Skin: Gross inspection of skin unremarkable. Head: normocephalic, no gross deformities. Eyes: no gross deformities noted. ENT: ears appear grossly normal no exudates. Neck: Supple. No thyromegaly. No LAD. Respiratory: no rhonchi noted. Cardiovascular: Normal S1 and S2 without murmur or rub. Extremities: No cyanosis. pulses  are equal. Neurologic: Alert and oriented. No involuntary movements.  LABS: Recent Results (from the past 2160 hour(s))  UA/M w/rflx Culture, Routine     Status: Abnormal   Collection Time: 06/21/18 11:53 AM  Result Value Ref Range   Specific Gravity, UA 1.022 1.005 - 1.030   pH, UA 5.5 5.0 - 7.5   Color, UA Yellow Yellow   Appearance Ur Clear Clear   Leukocytes, UA 2+ (A) Negative   Protein, UA Trace Negative/Trace   Glucose, UA Negative Negative   Ketones, UA Trace (A) Negative   RBC, UA Negative Negative   Bilirubin, UA Negative Negative   Urobilinogen, Ur 0.2 0.2 - 1.0 mg/dL   Nitrite, UA Negative Negative   Microscopic Examination See below:     Comment: Microscopic was indicated and was performed.   Urinalysis Reflex Comment     Comment: This specimen has reflexed to a Urine Culture.  Microscopic Examination     Status: Abnormal   Collection Time: 06/21/18 11:53 AM  Result Value Ref Range   WBC, UA >30 (A) 0 - 5 /hpf   RBC, UA 0-2 0 - 2 /hpf   Epithelial Cells (non renal) >10 (A) 0 - 10 /hpf   Casts None seen None seen /lpf   Crystals Present (A) N/A   Crystal Type Calcium Oxalate N/A   Mucus, UA Present Not Estab.   Bacteria, UA Few None seen/Few  Urine Culture, Reflex      Status: Abnormal   Collection Time: 06/21/18 11:53 AM  Result Value Ref Range   Urine Culture, Routine Final report (A)    Organism ID, Bacteria Escherichia coli (A)     Comment: Greater than 100,000 colony forming units per mL Cefazolin <=4 ug/mL Cefazolin with an MIC <=16 predicts susceptibility to the oral agents cefaclor, cefdinir, cefpodoxime, cefprozil, cefuroxime, cephalexin, and loracarbef when used for therapy of uncomplicated urinary tract infections due to E. coli, Klebsiella pneumoniae, and Proteus mirabilis.    Antimicrobial Susceptibility Comment     Comment:       ** S = Susceptible; I = Intermediate; R = Resistant **                    P = Positive; N = Negative             MICS are expressed in micrograms per mL    Antibiotic                 RSLT#1    RSLT#2    RSLT#3    RSLT#4 Amoxicillin/Clavulanic Acid    S Ampicillin                     S Cefepime                       S Ceftriaxone                    S Cefuroxime                     I Ciprofloxacin                  S Ertapenem                      S Gentamicin  S Imipenem                       S Levofloxacin                   S Meropenem                      S Nitrofurantoin                 S Piperacillin/Tazobactam        S Tetracycline                   S Tobramycin                     S Trimethoprim/Sulfa             S   Comprehensive metabolic panel     Status: Abnormal   Collection Time: 06/22/18  1:35 PM  Result Value Ref Range   Glucose 105 (H) 65 - 99 mg/dL   BUN 15 8 - 27 mg/dL   Creatinine, Ser 0.86 0.57 - 1.00 mg/dL   GFR calc non Af Amer 68 >59 mL/min/1.73   GFR calc Af Amer 79 >59 mL/min/1.73   BUN/Creatinine Ratio 17 12 - 28   Sodium 142 134 - 144 mmol/L   Potassium 3.9 3.5 - 5.2 mmol/L   Chloride 101 96 - 106 mmol/L   CO2 27 20 - 29 mmol/L   Calcium 9.3 8.7 - 10.3 mg/dL   Total Protein 7.5 6.0 - 8.5 g/dL   Albumin 4.2 3.5 - 4.8 g/dL   Globulin, Total 3.3 1.5 -  4.5 g/dL   Albumin/Globulin Ratio 1.3 1.2 - 2.2   Bilirubin Total 0.3 0.0 - 1.2 mg/dL   Alkaline Phosphatase 118 (H) 39 - 117 IU/L   AST 16 0 - 40 IU/L   ALT 12 0 - 32 IU/L  CBC     Status: None   Collection Time: 06/22/18  1:35 PM  Result Value Ref Range   WBC 6.6 3.4 - 10.8 x10E3/uL   RBC 4.56 3.77 - 5.28 x10E6/uL   Hemoglobin 13.8 11.1 - 15.9 g/dL   Hematocrit 40.5 34.0 - 46.6 %   MCV 89 79 - 97 fL   MCH 30.3 26.6 - 33.0 pg   MCHC 34.1 31.5 - 35.7 g/dL   RDW 14.3 12.3 - 15.4 %   Platelets 245 150 - 450 x10E3/uL  Lipid Panel w/o Chol/HDL Ratio     Status: Abnormal   Collection Time: 06/22/18  1:35 PM  Result Value Ref Range   Cholesterol, Total 167 100 - 199 mg/dL   Triglycerides 123 0 - 149 mg/dL   HDL 36 (L) >39 mg/dL   VLDL Cholesterol Cal 25 5 - 40 mg/dL   LDL Calculated 106 (H) 0 - 99 mg/dL  B12 and Folate Panel     Status: None   Collection Time: 06/22/18  1:35 PM  Result Value Ref Range   Vitamin B-12 264 232 - 1,245 pg/mL   Folate 3.9 >3.0 ng/mL    Comment: A serum folate concentration of less than 3.1 ng/mL is considered to represent clinical deficiency.   Hgb A1c w/o eAG     Status: Abnormal   Collection Time: 06/22/18  1:35 PM  Result Value Ref Range   Hgb A1c MFr Bld 5.8 (H) 4.8 - 5.6 %    Comment:  Prediabetes: 5.7 - 6.4          Diabetes: >6.4          Glycemic control for adults with diabetes: <7.0   T4, free     Status: None   Collection Time: 06/22/18  1:35 PM  Result Value Ref Range   Free T4 1.12 0.82 - 1.77 ng/dL  TSH     Status: None   Collection Time: 06/22/18  1:35 PM  Result Value Ref Range   TSH 1.190 0.450 - 4.500 uIU/mL  VITAMIN D 25 Hydroxy (Vit-D Deficiency, Fractures)     Status: Abnormal   Collection Time: 06/22/18  1:35 PM  Result Value Ref Range   Vit D, 25-Hydroxy 17.7 (L) 30.0 - 100.0 ng/mL    Comment: Vitamin D deficiency has been defined by the [place] practice guideline as  a level of serum 25-OH vitamin D less than 20 ng/mL (1,2). The Endocrine Society went on to further define vitamin D insufficiency as a level between 21 and 29 ng/mL (2). 1. IOM (Institute of Medicine). 2010. Dietary reference    intakes for calcium and D. [place]: The    [employer]. 2. Holick MF, Binkley Baraga, Bischoff-Ferrari HA, et al.    Evaluation, treatment, and prevention of vitamin D    deficiency: an Endocrine Society clinical practice    guideline. JCEM. 2011 Jul; 96(7):1911-30.     Radiology: No results found.  No results found.  No results found.    Assessment and Plan: Patient Active Problem List   Diagnosis Date Noted  . Vitamin B12 deficiency 06/30/2018  . Unspecified menopausal and perimenopausal disorder 06/30/2018  . Vitamin D deficiency 06/30/2018  . Flu vaccine need 06/30/2018  . Urinary tract infection without hematuria 02/05/2018  . Acquired hypothyroidism 02/05/2018  . Essential hypertension 02/05/2018  . Encounter for general adult medical examination with abnormal findings 02/05/2018  . Dysuria 02/05/2018  . Obstructive sleep apnea (adult) (pediatric) 01/06/2018    1. OSA patient is going to continue with her CPAP at the present level.  The patients oxygen levels are stable at this 2. Morbid obesity work on dietary and exercise management weight loss   General Counseling: I have discussed the findings of the evaluation and examination with Verdis Frederickson.  I have also discussed any further diagnostic evaluation thatmay be needed or ordered today. [place] verbalizes understanding of the findings of todays visit. We also reviewed her medications today and discussed drug interactions and side effects including but not limited excessive drowsiness and altered mental states. We also discussed that there is always a risk not just to her but also people around her. she has been encouraged to call the office with any questions or concerns that should  arise related to todays visit.    Time spent: 39mn  I have personally obtained a history, examined the patient, evaluated laboratory and imaging results, formulated the assessment and plan and placed orders.    SAllyne Gee MD FFort Duncan Regional Medical CenterPulmonary and Critical Care Sleep medicine

## 2018-07-05 NOTE — Patient Instructions (Signed)

## 2018-07-06 DIAGNOSIS — J3089 Other allergic rhinitis: Secondary | ICD-10-CM | POA: Diagnosis not present

## 2018-07-06 DIAGNOSIS — J3081 Allergic rhinitis due to animal (cat) (dog) hair and dander: Secondary | ICD-10-CM | POA: Diagnosis not present

## 2018-07-06 DIAGNOSIS — J301 Allergic rhinitis due to pollen: Secondary | ICD-10-CM | POA: Diagnosis not present

## 2018-07-07 ENCOUNTER — Ambulatory Visit (INDEPENDENT_AMBULATORY_CARE_PROVIDER_SITE_OTHER): Payer: Medicare Other

## 2018-07-07 ENCOUNTER — Encounter: Payer: Self-pay | Admitting: Internal Medicine

## 2018-07-07 DIAGNOSIS — G4733 Obstructive sleep apnea (adult) (pediatric): Secondary | ICD-10-CM | POA: Diagnosis not present

## 2018-07-07 NOTE — Progress Notes (Signed)
95 percentile pressure 7   95th percentile leak 0.6   apnea index 1.6 /hr  apnea-hypopnea index  3.1 /hr   total days used  >4 hr 90 days  total days used <4 hr 0 days  Total compliance 100 percent  Ms. Ressler  Does wonderful on cpap no problemsor concerns at this time

## 2018-07-13 DIAGNOSIS — J301 Allergic rhinitis due to pollen: Secondary | ICD-10-CM | POA: Diagnosis not present

## 2018-07-13 DIAGNOSIS — J3081 Allergic rhinitis due to animal (cat) (dog) hair and dander: Secondary | ICD-10-CM | POA: Diagnosis not present

## 2018-07-13 DIAGNOSIS — J3089 Other allergic rhinitis: Secondary | ICD-10-CM | POA: Diagnosis not present

## 2018-07-19 ENCOUNTER — Other Ambulatory Visit: Payer: Self-pay | Admitting: Nurse Practitioner

## 2018-07-19 ENCOUNTER — Telehealth: Payer: Self-pay | Admitting: Nurse Practitioner

## 2018-07-19 DIAGNOSIS — E559 Vitamin D deficiency, unspecified: Secondary | ICD-10-CM

## 2018-07-19 MED ORDER — ERGOCALCIFEROL 1.25 MG (50000 UT) PO CAPS: 50000.0000 [IU] | ORAL_CAPSULE | ORAL | 5 refills | Status: DC

## 2018-07-19 NOTE — Telephone Encounter (Signed)
Pt was called and notified of labs and vitamin d called into pharmacy

## 2018-07-19 NOTE — Progress Notes (Signed)
Add drisdol 50000iu weekly for vitamin d deficiency.

## 2018-07-20 DIAGNOSIS — J3081 Allergic rhinitis due to animal (cat) (dog) hair and dander: Secondary | ICD-10-CM | POA: Diagnosis not present

## 2018-07-20 DIAGNOSIS — J3089 Other allergic rhinitis: Secondary | ICD-10-CM | POA: Diagnosis not present

## 2018-07-20 DIAGNOSIS — J301 Allergic rhinitis due to pollen: Secondary | ICD-10-CM | POA: Diagnosis not present

## 2018-07-29 DIAGNOSIS — J3089 Other allergic rhinitis: Secondary | ICD-10-CM | POA: Diagnosis not present

## 2018-07-29 DIAGNOSIS — J301 Allergic rhinitis due to pollen: Secondary | ICD-10-CM | POA: Diagnosis not present

## 2018-07-29 DIAGNOSIS — J3081 Allergic rhinitis due to animal (cat) (dog) hair and dander: Secondary | ICD-10-CM | POA: Diagnosis not present

## 2018-07-30 ENCOUNTER — Other Ambulatory Visit: Payer: Self-pay | Admitting: Internal Medicine

## 2018-08-03 DIAGNOSIS — J301 Allergic rhinitis due to pollen: Secondary | ICD-10-CM | POA: Diagnosis not present

## 2018-08-03 DIAGNOSIS — J3089 Other allergic rhinitis: Secondary | ICD-10-CM | POA: Diagnosis not present

## 2018-08-03 DIAGNOSIS — J3081 Allergic rhinitis due to animal (cat) (dog) hair and dander: Secondary | ICD-10-CM | POA: Diagnosis not present

## 2018-08-10 DIAGNOSIS — J3089 Other allergic rhinitis: Secondary | ICD-10-CM | POA: Diagnosis not present

## 2018-08-10 DIAGNOSIS — J3081 Allergic rhinitis due to animal (cat) (dog) hair and dander: Secondary | ICD-10-CM | POA: Diagnosis not present

## 2018-08-10 DIAGNOSIS — J301 Allergic rhinitis due to pollen: Secondary | ICD-10-CM | POA: Diagnosis not present

## 2018-08-11 DIAGNOSIS — G4733 Obstructive sleep apnea (adult) (pediatric): Secondary | ICD-10-CM | POA: Diagnosis not present

## 2018-08-19 DIAGNOSIS — J301 Allergic rhinitis due to pollen: Secondary | ICD-10-CM | POA: Diagnosis not present

## 2018-08-19 DIAGNOSIS — J3081 Allergic rhinitis due to animal (cat) (dog) hair and dander: Secondary | ICD-10-CM | POA: Diagnosis not present

## 2018-08-19 DIAGNOSIS — J3089 Other allergic rhinitis: Secondary | ICD-10-CM | POA: Diagnosis not present

## 2018-08-26 DIAGNOSIS — J301 Allergic rhinitis due to pollen: Secondary | ICD-10-CM | POA: Diagnosis not present

## 2018-08-26 DIAGNOSIS — J3089 Other allergic rhinitis: Secondary | ICD-10-CM | POA: Diagnosis not present

## 2018-08-26 DIAGNOSIS — J3081 Allergic rhinitis due to animal (cat) (dog) hair and dander: Secondary | ICD-10-CM | POA: Diagnosis not present

## 2018-08-31 DIAGNOSIS — J3081 Allergic rhinitis due to animal (cat) (dog) hair and dander: Secondary | ICD-10-CM | POA: Diagnosis not present

## 2018-08-31 DIAGNOSIS — J3089 Other allergic rhinitis: Secondary | ICD-10-CM | POA: Diagnosis not present

## 2018-08-31 DIAGNOSIS — J301 Allergic rhinitis due to pollen: Secondary | ICD-10-CM | POA: Diagnosis not present

## 2018-09-07 DIAGNOSIS — J3089 Other allergic rhinitis: Secondary | ICD-10-CM | POA: Diagnosis not present

## 2018-09-07 DIAGNOSIS — J301 Allergic rhinitis due to pollen: Secondary | ICD-10-CM | POA: Diagnosis not present

## 2018-09-07 DIAGNOSIS — J3081 Allergic rhinitis due to animal (cat) (dog) hair and dander: Secondary | ICD-10-CM | POA: Diagnosis not present

## 2018-09-14 DIAGNOSIS — J3089 Other allergic rhinitis: Secondary | ICD-10-CM | POA: Diagnosis not present

## 2018-09-14 DIAGNOSIS — J301 Allergic rhinitis due to pollen: Secondary | ICD-10-CM | POA: Diagnosis not present

## 2018-09-14 DIAGNOSIS — J3081 Allergic rhinitis due to animal (cat) (dog) hair and dander: Secondary | ICD-10-CM | POA: Diagnosis not present

## 2018-09-21 DIAGNOSIS — J3081 Allergic rhinitis due to animal (cat) (dog) hair and dander: Secondary | ICD-10-CM | POA: Diagnosis not present

## 2018-09-21 DIAGNOSIS — J3089 Other allergic rhinitis: Secondary | ICD-10-CM | POA: Diagnosis not present

## 2018-09-21 DIAGNOSIS — J301 Allergic rhinitis due to pollen: Secondary | ICD-10-CM | POA: Diagnosis not present

## 2018-09-28 DIAGNOSIS — J3081 Allergic rhinitis due to animal (cat) (dog) hair and dander: Secondary | ICD-10-CM | POA: Diagnosis not present

## 2018-09-28 DIAGNOSIS — J3089 Other allergic rhinitis: Secondary | ICD-10-CM | POA: Diagnosis not present

## 2018-09-28 DIAGNOSIS — J301 Allergic rhinitis due to pollen: Secondary | ICD-10-CM | POA: Diagnosis not present

## 2018-10-05 DIAGNOSIS — J3089 Other allergic rhinitis: Secondary | ICD-10-CM | POA: Diagnosis not present

## 2018-10-05 DIAGNOSIS — J301 Allergic rhinitis due to pollen: Secondary | ICD-10-CM | POA: Diagnosis not present

## 2018-10-05 DIAGNOSIS — J3081 Allergic rhinitis due to animal (cat) (dog) hair and dander: Secondary | ICD-10-CM | POA: Diagnosis not present

## 2018-10-14 DIAGNOSIS — J301 Allergic rhinitis due to pollen: Secondary | ICD-10-CM | POA: Diagnosis not present

## 2018-10-14 DIAGNOSIS — J3089 Other allergic rhinitis: Secondary | ICD-10-CM | POA: Diagnosis not present

## 2018-10-14 DIAGNOSIS — J3081 Allergic rhinitis due to animal (cat) (dog) hair and dander: Secondary | ICD-10-CM | POA: Diagnosis not present

## 2018-10-21 DIAGNOSIS — J3089 Other allergic rhinitis: Secondary | ICD-10-CM | POA: Diagnosis not present

## 2018-10-21 DIAGNOSIS — J301 Allergic rhinitis due to pollen: Secondary | ICD-10-CM | POA: Diagnosis not present

## 2018-10-21 DIAGNOSIS — J3081 Allergic rhinitis due to animal (cat) (dog) hair and dander: Secondary | ICD-10-CM | POA: Diagnosis not present

## 2018-10-28 DIAGNOSIS — J3081 Allergic rhinitis due to animal (cat) (dog) hair and dander: Secondary | ICD-10-CM | POA: Diagnosis not present

## 2018-10-28 DIAGNOSIS — J3089 Other allergic rhinitis: Secondary | ICD-10-CM | POA: Diagnosis not present

## 2018-10-28 DIAGNOSIS — J301 Allergic rhinitis due to pollen: Secondary | ICD-10-CM | POA: Diagnosis not present

## 2018-11-02 DIAGNOSIS — H1045 Other chronic allergic conjunctivitis: Secondary | ICD-10-CM | POA: Diagnosis not present

## 2018-11-02 DIAGNOSIS — J301 Allergic rhinitis due to pollen: Secondary | ICD-10-CM | POA: Diagnosis not present

## 2018-11-02 DIAGNOSIS — J3089 Other allergic rhinitis: Secondary | ICD-10-CM | POA: Diagnosis not present

## 2018-11-02 DIAGNOSIS — J3081 Allergic rhinitis due to animal (cat) (dog) hair and dander: Secondary | ICD-10-CM | POA: Diagnosis not present

## 2018-11-09 DIAGNOSIS — J3081 Allergic rhinitis due to animal (cat) (dog) hair and dander: Secondary | ICD-10-CM | POA: Diagnosis not present

## 2018-11-09 DIAGNOSIS — J301 Allergic rhinitis due to pollen: Secondary | ICD-10-CM | POA: Diagnosis not present

## 2018-11-09 DIAGNOSIS — J3089 Other allergic rhinitis: Secondary | ICD-10-CM | POA: Diagnosis not present

## 2018-11-15 DIAGNOSIS — G4733 Obstructive sleep apnea (adult) (pediatric): Secondary | ICD-10-CM | POA: Diagnosis not present

## 2018-11-19 DIAGNOSIS — J3081 Allergic rhinitis due to animal (cat) (dog) hair and dander: Secondary | ICD-10-CM | POA: Diagnosis not present

## 2018-11-19 DIAGNOSIS — J301 Allergic rhinitis due to pollen: Secondary | ICD-10-CM | POA: Diagnosis not present

## 2018-11-19 DIAGNOSIS — J3089 Other allergic rhinitis: Secondary | ICD-10-CM | POA: Diagnosis not present

## 2018-11-23 DIAGNOSIS — J301 Allergic rhinitis due to pollen: Secondary | ICD-10-CM | POA: Diagnosis not present

## 2018-11-23 DIAGNOSIS — J3081 Allergic rhinitis due to animal (cat) (dog) hair and dander: Secondary | ICD-10-CM | POA: Diagnosis not present

## 2018-11-23 DIAGNOSIS — J3089 Other allergic rhinitis: Secondary | ICD-10-CM | POA: Diagnosis not present

## 2018-11-30 DIAGNOSIS — J3081 Allergic rhinitis due to animal (cat) (dog) hair and dander: Secondary | ICD-10-CM | POA: Diagnosis not present

## 2018-11-30 DIAGNOSIS — J301 Allergic rhinitis due to pollen: Secondary | ICD-10-CM | POA: Diagnosis not present

## 2018-11-30 DIAGNOSIS — J3089 Other allergic rhinitis: Secondary | ICD-10-CM | POA: Diagnosis not present

## 2018-12-10 DIAGNOSIS — J3081 Allergic rhinitis due to animal (cat) (dog) hair and dander: Secondary | ICD-10-CM | POA: Diagnosis not present

## 2018-12-10 DIAGNOSIS — J301 Allergic rhinitis due to pollen: Secondary | ICD-10-CM | POA: Diagnosis not present

## 2018-12-10 DIAGNOSIS — J3089 Other allergic rhinitis: Secondary | ICD-10-CM | POA: Diagnosis not present

## 2018-12-16 ENCOUNTER — Other Ambulatory Visit: Payer: Self-pay

## 2018-12-16 DIAGNOSIS — J3089 Other allergic rhinitis: Secondary | ICD-10-CM | POA: Diagnosis not present

## 2018-12-16 DIAGNOSIS — J3081 Allergic rhinitis due to animal (cat) (dog) hair and dander: Secondary | ICD-10-CM | POA: Diagnosis not present

## 2018-12-16 DIAGNOSIS — J301 Allergic rhinitis due to pollen: Secondary | ICD-10-CM | POA: Diagnosis not present

## 2018-12-16 MED ORDER — CHLORHEXIDINE GLUCONATE 0.12 % MT SOLN: 5.0000 mL | Freq: Two times a day (BID) | OROMUCOSAL | 1 refills | Status: DC

## 2018-12-20 ENCOUNTER — Ambulatory Visit (INDEPENDENT_AMBULATORY_CARE_PROVIDER_SITE_OTHER): Payer: Medicare Other | Admitting: Nurse Practitioner

## 2018-12-20 ENCOUNTER — Other Ambulatory Visit: Payer: Self-pay

## 2018-12-20 ENCOUNTER — Encounter: Payer: Self-pay | Admitting: Nurse Practitioner

## 2018-12-20 VITALS — BP 170/94 | HR 65 | Resp 16 | Ht <= 58 in | Wt 187.2 lb

## 2018-12-20 DIAGNOSIS — R300 Dysuria: Secondary | ICD-10-CM | POA: Diagnosis not present

## 2018-12-20 DIAGNOSIS — Z1239 Encounter for other screening for malignant neoplasm of breast: Secondary | ICD-10-CM

## 2018-12-20 DIAGNOSIS — E039 Hypothyroidism, unspecified: Secondary | ICD-10-CM | POA: Diagnosis not present

## 2018-12-20 DIAGNOSIS — N390 Urinary tract infection, site not specified: Secondary | ICD-10-CM | POA: Diagnosis not present

## 2018-12-20 DIAGNOSIS — R319 Hematuria, unspecified: Secondary | ICD-10-CM

## 2018-12-20 DIAGNOSIS — I10 Essential (primary) hypertension: Secondary | ICD-10-CM

## 2018-12-20 LAB — POCT URINALYSIS DIPSTICK
BILIRUBIN UA: NEGATIVE
GLUCOSE UA: NEGATIVE
KETONES UA: NEGATIVE
Nitrite, UA: NEGATIVE
Protein, UA: POSITIVE — AB
Spec Grav, UA: 1.01 (ref 1.010–1.025)
Urobilinogen, UA: 0.2 E.U./dL
pH, UA: 7.5 (ref 5.0–8.0)

## 2018-12-20 MED ORDER — SULFAMETHOXAZOLE-TRIMETHOPRIM 800-160 MG PO TABS: 1.0000 | ORAL_TABLET | Freq: Two times a day (BID) | ORAL | 0 refills | Status: DC

## 2018-12-20 NOTE — Progress Notes (Signed)
[facility] [place], [place] [address]  Internal MEDICINE  Office Visit Note  Patient Name: Claudia Martin  [id number]  [id number]  Date of Service: 01/05/2019  Chief Complaint  Patient presents with  . Medical Management of Chronic Issues    6 month follow up  . Hypertension  . Urinary Tract Infection    pt thinks she may possibly have a bladder infection, pt noticed a fould odor, and has been urinating more often than usual, no itching or burning   . Pain    right ear pain, went outside after she got out the shower, pain in right ear last night    The patient reports having mild urgency of urination. She states that urine has been darker than normal. Having to use the bathroom right away if she feels the urge to urinate. Denies painful urination. Denies abdominal or pelvic.  Blood pressure moderately elevated today. Has been stressed recently which may be contributing to the elevation in blood pressure. She also states she has not taken her blood pressure medication yet today. Blood pressure is generally well controlled.       Current Medication: Outpatient Encounter Medications as of 12/20/2018  Medication Sig Note  . acyclovir ointment (ZOVIRAX) 5 % APPLY TO AFFECTED AREAS 6  TIMES DAILY FOR 7 DAYS   . amLODipine (NORVASC) 2.5 MG tablet Take 1 tablet (2.5 mg total) by mouth daily.   Marland Kitchen atenolol (TENORMIN) 25 MG tablet takes 1 and 1/2 tablets twice a day.   . Blood Glucose Monitoring Suppl (ACCU-CHEK AVIVA PLUS) w/Device KIT Use as directed   . cetirizine (ZYRTEC) 10 MG tablet Take 10 mg by mouth daily.   . chlorhexidine (PERIDEX) 0.12 % solution Use as directed 5 mLs in the mouth or throat 2 (two) times daily. For 2 weeks   . conjugated estrogens (PREMARIN) vaginal cream Use 1 applicatorful twice weekly.   Marland Kitchen EPINEPHrine (EPIPEN 2-PAK) 0.3 mg/0.3 mL IJ SOAJ injection as needed. 07/16/2015: Received from: [place]  .  ergocalciferol (DRISDOL) 50000 units capsule Take 1 capsule (50,000 Units total) by mouth once a week.   . fluconazole (DIFLUCAN) 150 MG tablet take 1 tablet by mouth once daily for 3 days 07/16/2015: Received from: External Pharmacy  . ketoconazole (NIZORAL) 2 % cream Apply 1 application topically daily.   Marland Kitchen levothyroxine (SYNTHROID, LEVOTHROID) 50 MCG tablet Take 1 tablet (50 mcg total) by mouth daily before breakfast.   . medroxyPROGESTERone (PROVERA) 2.5 MG tablet once daily. 07/16/2015: Received from: [place]  . metroNIDAZOLE (METROGEL) 0.75 % vaginal gel  07/16/2015: Received from: [facility]  . montelukast (SINGULAIR) 10 MG tablet Take by mouth.   . naproxen sodium (ALEVE) 220 MG tablet Take by mouth. 07/16/2015: Received from: [facility]  . nystatin cream (MYCOSTATIN) Apply 1 application topically 2 (two) times daily.   Marland Kitchen olopatadine (PATANOL) 0.1 % ophthalmic solution instill 1 drop into both eyes twice a day for ITCHY EYES 07/16/2015: Received from: External Pharmacy  . Olopatadine HCl (PATADAY) 0.2 % SOLN Place 1 drop into both eyes 2 (two) times daily.   . valACYclovir (VALTREX) 1000 MG tablet Take 1 tablet (1,000 mg total) by mouth daily. Pt can 1 tablet twice a day if flare up for 7 days. (Patient not taking: Reported on 12/23/2018)   . [DISCONTINUED] nitrofurantoin, macrocrystal-monohydrate, (MACROBID) 100 MG capsule Take 1 capsule (100 mg total) by mouth 2 (two)  times daily. (Patient not taking: Reported on 12/20/2018)   . [DISCONTINUED] sulfamethoxazole-trimethoprim (BACTRIM DS,SEPTRA DS) 800-160 MG tablet Take 1 tablet by mouth 2 (two) times daily.    No facility-administered encounter medications on file as of 12/20/2018.     Surgical History: Past Surgical History:  Procedure Laterality Date  . BREAST BIOPSY    . COLON SURGERY    . COLONOSCOPY WITH PROPOFOL N/A 03/15/2018   Procedure: COLONOSCOPY WITH PROPOFOL;   Surgeon: Lin Landsman, MD;  Location: [facility] ENDOSCOPY;  Service: Gastroenterology;  Laterality: N/A;  . diverticulitis    . ECTOPIC PREGNANCY SURGERY    . fx thumb    . HERNIA REPAIR      Medical History: Past Medical History:  Diagnosis Date  . Abdominal pain   . Allergic rhinitis   . Candidiasis of vulva and vagina   . Cellulitis and abscess   . Diverticulosis   . Dysuria   . Eyelid inflammation   . Hematuria syndrome   . Hemorrhoids   . Herpes genitalis   . Herpes simplex   . HTN (hypertension)   . Hydronephrosis   . Hypothyroidism   . Incomplete bladder emptying   . Internal hordeolum   . Knee pain   . Otalgia   . Primary ovarian failure   . Sciatica   . Shortness of breath   . Urethral diverticulum   . UTI (lower urinary tract infection)   . Vaginal atrophy   . Vaginitis     Family History: Family History  Problem Relation Age of Onset  . Tuberculosis Mother   . Nephrolithiasis Brother     Social History   Socioeconomic History  . Marital status: Divorced    Spouse name: Not on file  . Number of children: Not on file  . Years of education: Not on file  . Highest education level: Not on file  Occupational History  . Not on file  Social Needs  . Financial resource strain: Not on file  . Food insecurity:    Worry: Not on file    Inability: Not on file  . Transportation needs:    Medical: Not on file    Non-medical: Not on file  Tobacco Use  . Smoking status: Never Smoker  . Smokeless tobacco: Never Used  Substance and Sexual Activity  . Alcohol use: No  . Drug use: Never  . Sexual activity: Not on file  Lifestyle  . Physical activity:    Days per week: Not on file    Minutes per session: Not on file  . Stress: Not on file  Relationships  . Social connections:    Talks on phone: Not on file    Gets together: Not on file    Attends religious service: Not on file    Active member of club or organization: Not on file    Attends  meetings of clubs or organizations: Not on file    Relationship status: Not on file  . Intimate partner violence:    Fear of current or ex partner: Not on file    Emotionally abused: Not on file    Physically abused: Not on file    Forced sexual activity: Not on file  Other Topics Concern  . Not on file  Social History Narrative  . Not on file      Review of Systems  Constitutional: Negative for activity change, chills, fatigue and unexpected weight change.  HENT: Negative for congestion, postnasal drip,  rhinorrhea, sneezing, sore throat and voice change.   Respiratory: Negative for cough, chest tightness, shortness of breath and wheezing.   Cardiovascular: Negative for chest pain and palpitations.       Blood pressure is elevated today. Has not taken her blood pressure yet today.   Gastrointestinal: Negative for abdominal pain, constipation, diarrhea, nausea and vomiting.  Endocrine: Negative for cold intolerance, heat intolerance, polydipsia and polyuria.       Well controlled hypothyroid.  Genitourinary: Positive for dysuria, frequency and urgency.  Musculoskeletal: Positive for arthralgias. Negative for back pain, joint swelling and neck pain.       Bilateral knee pain  Skin: Negative for rash.  Allergic/Immunologic: Positive for environmental allergies.  Neurological: Negative for tremors, numbness and headaches.  Hematological: Negative for adenopathy. Does not bruise/bleed easily.  Psychiatric/Behavioral: Negative for behavioral problems (Depression), sleep disturbance and suicidal ideas. The patient is not nervous/anxious.     Today's Vitals   12/20/18 1059  BP: (!) 170/94  Pulse: 65  Resp: 16  SpO2: 97%  Weight: 187 lb 3.2 oz (84.9 kg)  Height: 4' 7"  (1.397 m)   Body mass index is 43.51 kg/m. Physical Exam Vitals signs and nursing note reviewed.  Constitutional:      General: She is not in acute distress.    Appearance: She is well-developed. She is not  diaphoretic.  HENT:     Head: Normocephalic and atraumatic.     Nose: Nose normal.     Mouth/Throat:     Pharynx: No oropharyngeal exudate.  Eyes:     Conjunctiva/sclera: Conjunctivae normal.     Pupils: Pupils are equal, round, and reactive to light.  Neck:     Musculoskeletal: Normal range of motion and neck supple.     Thyroid: No thyromegaly.     Vascular: No carotid bruit or JVD.     Trachea: No tracheal deviation.  Cardiovascular:     Rate and Rhythm: Normal rate and regular rhythm.     Heart sounds: Normal heart sounds. No murmur. No friction rub. No gallop.   Pulmonary:     Effort: Pulmonary effort is normal. No respiratory distress.     Breath sounds: Normal breath sounds. No wheezing or rales.  Chest:     Chest wall: No tenderness.     Breasts:        Right: No inverted nipple, mass, nipple discharge, skin change or tenderness.        Left: No inverted nipple, mass, nipple discharge, skin change or tenderness.  Abdominal:     General: Bowel sounds are normal.     Palpations: Abdomen is soft.     Tenderness: There is no abdominal tenderness.  Genitourinary:    Comments: Urine sample positive for trace blood and moderate WBC.  Musculoskeletal:     Comments: Bilateral knee tenderness. Using cane to help with mobility.   Lymphadenopathy:     Cervical: No cervical adenopathy.  Skin:    General: Skin is warm and dry.     Capillary Refill: Capillary refill takes less than 2 seconds.  Neurological:     Mental Status: She is alert and oriented to person, place, and time.     Cranial Nerves: No cranial nerve deficit.  Psychiatric:        Behavior: Behavior normal.        Thought Content: Thought content normal.        Judgment: Judgment normal.    Assessment/Plan: 1. Urinary tract infection  with hematuria, site unspecified Start on bactrim DS bid for 10 days. Send urine for culture and sensitivity and adjust abx as indicated.   2. Dysuria - POCT Urinalysis  Dipstick - CULTURE, URINE COMPREHENSIVE  3. Essential hypertension Generally stable, however, she has not taken her blood pressure medication yet today. Advised she take it when she gets home and continue to take it as prescribed. Monitor blood pressure closely   4. Acquired hypothyroidism Continue levothyroxine as prescribed .  5. Screening for breast cancer - MM DIGITAL SCREENING BILATERAL; Future  General Counseling: kadie balestrieri understanding of the findings of todays visit and agrees with plan of treatment. I have discussed any further diagnostic evaluation that may be needed or ordered today. We also reviewed her medications today. she has been encouraged to call the office with any questions or concerns that should arise related to todays visit.  Hypertension Counseling:   The following hypertensive lifestyle modification were recommended and discussed:  1. Limiting alcohol intake to less than 1 oz/day of ethanol:(24 oz of beer or 8 oz of wine or 2 oz of 100-proof whiskey). 2. Take baby ASA 81 mg daily. 3. Importance of regular aerobic exercise and losing weight. 4. Reduce dietary saturated fat and cholesterol intake for overall cardiovascular health. 5. Maintaining adequate dietary potassium, calcium, and magnesium intake. 6. Regular monitoring of the blood pressure. 7. Reduce sodium intake to less than 100 mmol/day (less than 2.3 gm of sodium or less than 6 gm of sodium choride)   This patient was seen by Ar[place] with Dr Lavera Guise as a part of collaborative care agreement  Orders Placed This Encounter  Procedures  . CULTURE, URINE COMPREHENSIVE  . MM DIGITAL SCREENING BILATERAL  . POCT Urinalysis Dipstick    Meds ordered this encounter  Medications  . DISCONTD: sulfamethoxazole-trimethoprim (BACTRIM DS,SEPTRA DS) 800-160 MG tablet    Sig: Take 1 tablet by mouth 2 (two) times daily.    Dispense:  20 tablet    Refill:  0    Order Specific  Question:   Supervising Provider    Answer:   Lavera Guise [[id number]]    Time spent: 48 Minutes      Dr Lavera Guise Internal medicine

## 2018-12-20 NOTE — Progress Notes (Signed)
Pt blood pressure elevated, she stated that she was a little stressed about her vehicle,  Two readings  1st reading 176/87 machine 2nd reading manually 170/94 Informed provider of elevated blood pressure.

## 2018-12-22 LAB — CULTURE, URINE COMPREHENSIVE

## 2018-12-23 ENCOUNTER — Other Ambulatory Visit: Payer: Self-pay | Admitting: Nurse Practitioner

## 2018-12-23 ENCOUNTER — Telehealth: Payer: Self-pay

## 2018-12-23 DIAGNOSIS — R319 Hematuria, unspecified: Principal | ICD-10-CM

## 2018-12-23 DIAGNOSIS — N390 Urinary tract infection, site not specified: Secondary | ICD-10-CM

## 2018-12-23 MED ORDER — NITROFURANTOIN MONOHYD MACRO 100 MG PO CAPS: 100.0000 mg | ORAL_CAPSULE | Freq: Two times a day (BID) | ORAL | 0 refills | Status: DC

## 2018-12-23 NOTE — Telephone Encounter (Signed)
Stop bactrim. Start with benadryl 25mg  every four to six hours as needed for swelling and itching from hives. She may also take OTC pepcid 20mg  twice daily which will also help. If hasving difficulty breathing, she should be seen in ER for allergic reaction. I have changed her antibiotic to macrobid 100mg  bid for 10 days and sent new prescription to her pharmacy.

## 2018-12-23 NOTE — Telephone Encounter (Signed)
Pt advised stopped bactrim take benadryl 25 mg every 4 to 6 hrs as needed for swelling and hives if she shortness breath go to ER for allergic reaction and also she change antibiotic to macrobid

## 2018-12-23 NOTE — Progress Notes (Signed)
Stop bactrim. Start with benadryl 25mg  every four to six hours as needed for swelling and itching from hives. She may also take OTC pepcid 20mg  twice daily which will also help. If hasving difficulty breathing, she should be seen in ER for allergic reaction. I have changed her antibiotic to macrobid 100mg  bid for 10 days and sent new prescription to her pharmacy.

## 2018-12-27 ENCOUNTER — Telehealth: Payer: Self-pay

## 2018-12-27 NOTE — Telephone Encounter (Signed)
Spoke with pt  swelling and hives is better and much better

## 2018-12-31 ENCOUNTER — Telehealth: Payer: Self-pay

## 2018-12-31 NOTE — Telephone Encounter (Signed)
Pt called that having rash and bllisters  And allergic reaction with macrobid and swollen stopped macrobid and take benadryl 4 to 6 hrs and advised pt if you have sob or any other changes need to go to ER as per [employer]

## 2019-01-03 ENCOUNTER — Ambulatory Visit: Payer: Self-pay | Admitting: Internal Medicine

## 2019-01-11 ENCOUNTER — Other Ambulatory Visit: Payer: Self-pay

## 2019-01-11 DIAGNOSIS — E559 Vitamin D deficiency, unspecified: Secondary | ICD-10-CM

## 2019-01-11 MED ORDER — ERGOCALCIFEROL 1.25 MG (50000 UT) PO CAPS: 50000.0000 [IU] | ORAL_CAPSULE | ORAL | 0 refills | Status: DC

## 2019-01-12 ENCOUNTER — Ambulatory Visit: Payer: Self-pay

## 2019-01-13 DIAGNOSIS — J301 Allergic rhinitis due to pollen: Secondary | ICD-10-CM | POA: Diagnosis not present

## 2019-01-13 DIAGNOSIS — J3089 Other allergic rhinitis: Secondary | ICD-10-CM | POA: Diagnosis not present

## 2019-01-13 DIAGNOSIS — J3081 Allergic rhinitis due to animal (cat) (dog) hair and dander: Secondary | ICD-10-CM | POA: Diagnosis not present

## 2019-01-21 DIAGNOSIS — J3089 Other allergic rhinitis: Secondary | ICD-10-CM | POA: Diagnosis not present

## 2019-01-21 DIAGNOSIS — J3081 Allergic rhinitis due to animal (cat) (dog) hair and dander: Secondary | ICD-10-CM | POA: Diagnosis not present

## 2019-01-21 DIAGNOSIS — J301 Allergic rhinitis due to pollen: Secondary | ICD-10-CM | POA: Diagnosis not present

## 2019-01-24 ENCOUNTER — Ambulatory Visit: Payer: Self-pay | Admitting: Internal Medicine

## 2019-01-25 DIAGNOSIS — J3081 Allergic rhinitis due to animal (cat) (dog) hair and dander: Secondary | ICD-10-CM | POA: Diagnosis not present

## 2019-01-25 DIAGNOSIS — J301 Allergic rhinitis due to pollen: Secondary | ICD-10-CM | POA: Diagnosis not present

## 2019-01-25 DIAGNOSIS — J3089 Other allergic rhinitis: Secondary | ICD-10-CM | POA: Diagnosis not present

## 2019-02-01 DIAGNOSIS — J3089 Other allergic rhinitis: Secondary | ICD-10-CM | POA: Diagnosis not present

## 2019-02-01 DIAGNOSIS — J3081 Allergic rhinitis due to animal (cat) (dog) hair and dander: Secondary | ICD-10-CM | POA: Diagnosis not present

## 2019-02-01 DIAGNOSIS — J301 Allergic rhinitis due to pollen: Secondary | ICD-10-CM | POA: Diagnosis not present

## 2019-02-03 ENCOUNTER — Other Ambulatory Visit: Payer: Self-pay

## 2019-02-03 DIAGNOSIS — I10 Essential (primary) hypertension: Secondary | ICD-10-CM

## 2019-02-03 MED ORDER — AMLODIPINE BESYLATE 2.5 MG PO TABS: 2.5000 mg | ORAL_TABLET | Freq: Every day | ORAL | 1 refills | Status: DC

## 2019-02-03 MED ORDER — CHLORHEXIDINE GLUCONATE 0.12 % MT SOLN: 5.0000 mL | Freq: Two times a day (BID) | OROMUCOSAL | 0 refills | Status: AC

## 2019-02-09 ENCOUNTER — Other Ambulatory Visit: Payer: Self-pay

## 2019-02-09 ENCOUNTER — Ambulatory Visit (INDEPENDENT_AMBULATORY_CARE_PROVIDER_SITE_OTHER): Payer: Medicare Other

## 2019-02-09 DIAGNOSIS — G4733 Obstructive sleep apnea (adult) (pediatric): Secondary | ICD-10-CM | POA: Diagnosis not present

## 2019-02-09 NOTE — Progress Notes (Signed)
95 percentile pressure 7   95th percentile leak 0.0   apnea index 1.3 /hr  apnea-hypopnea index  2.4 /hr   total days used  >4 hr 90 days  total days used <4 hr 0 days  Total compliance 100 percent  Great usage on cpap. The cpap working very good. No problems or question at this time

## 2019-02-10 DIAGNOSIS — J3081 Allergic rhinitis due to animal (cat) (dog) hair and dander: Secondary | ICD-10-CM | POA: Diagnosis not present

## 2019-02-10 DIAGNOSIS — J301 Allergic rhinitis due to pollen: Secondary | ICD-10-CM | POA: Diagnosis not present

## 2019-02-10 DIAGNOSIS — J3089 Other allergic rhinitis: Secondary | ICD-10-CM | POA: Diagnosis not present

## 2019-02-14 ENCOUNTER — Other Ambulatory Visit: Payer: Self-pay

## 2019-02-14 ENCOUNTER — Ambulatory Visit: Payer: Self-pay | Admitting: Internal Medicine

## 2019-02-14 DIAGNOSIS — E559 Vitamin D deficiency, unspecified: Secondary | ICD-10-CM

## 2019-02-14 MED ORDER — ERGOCALCIFEROL 1.25 MG (50000 UT) PO CAPS: 50000.0000 [IU] | ORAL_CAPSULE | ORAL | 3 refills | Status: DC

## 2019-02-15 DIAGNOSIS — G4733 Obstructive sleep apnea (adult) (pediatric): Secondary | ICD-10-CM | POA: Diagnosis not present

## 2019-02-16 ENCOUNTER — Other Ambulatory Visit: Payer: Self-pay

## 2019-02-16 DIAGNOSIS — E039 Hypothyroidism, unspecified: Secondary | ICD-10-CM

## 2019-02-16 MED ORDER — LEVOTHYROXINE SODIUM 50 MCG PO TABS: 50.0000 ug | ORAL_TABLET | Freq: Every day | ORAL | 1 refills | Status: DC

## 2019-02-17 DIAGNOSIS — J301 Allergic rhinitis due to pollen: Secondary | ICD-10-CM | POA: Diagnosis not present

## 2019-02-17 DIAGNOSIS — J3089 Other allergic rhinitis: Secondary | ICD-10-CM | POA: Diagnosis not present

## 2019-02-25 DIAGNOSIS — J3081 Allergic rhinitis due to animal (cat) (dog) hair and dander: Secondary | ICD-10-CM | POA: Diagnosis not present

## 2019-02-25 DIAGNOSIS — J3089 Other allergic rhinitis: Secondary | ICD-10-CM | POA: Diagnosis not present

## 2019-02-25 DIAGNOSIS — J301 Allergic rhinitis due to pollen: Secondary | ICD-10-CM | POA: Diagnosis not present

## 2019-03-03 DIAGNOSIS — J3089 Other allergic rhinitis: Secondary | ICD-10-CM | POA: Diagnosis not present

## 2019-03-03 DIAGNOSIS — J3081 Allergic rhinitis due to animal (cat) (dog) hair and dander: Secondary | ICD-10-CM | POA: Diagnosis not present

## 2019-03-03 DIAGNOSIS — J301 Allergic rhinitis due to pollen: Secondary | ICD-10-CM | POA: Diagnosis not present

## 2019-03-08 DIAGNOSIS — J3081 Allergic rhinitis due to animal (cat) (dog) hair and dander: Secondary | ICD-10-CM | POA: Diagnosis not present

## 2019-03-08 DIAGNOSIS — J301 Allergic rhinitis due to pollen: Secondary | ICD-10-CM | POA: Diagnosis not present

## 2019-03-08 DIAGNOSIS — J3089 Other allergic rhinitis: Secondary | ICD-10-CM | POA: Diagnosis not present

## 2019-03-14 ENCOUNTER — Other Ambulatory Visit: Payer: Self-pay

## 2019-03-14 DIAGNOSIS — I10 Essential (primary) hypertension: Secondary | ICD-10-CM

## 2019-03-14 MED ORDER — ATENOLOL 25 MG PO TABS: ORAL_TABLET | ORAL | 0 refills | Status: DC

## 2019-03-15 DIAGNOSIS — J3081 Allergic rhinitis due to animal (cat) (dog) hair and dander: Secondary | ICD-10-CM | POA: Diagnosis not present

## 2019-03-15 DIAGNOSIS — J301 Allergic rhinitis due to pollen: Secondary | ICD-10-CM | POA: Diagnosis not present

## 2019-03-15 DIAGNOSIS — J3089 Other allergic rhinitis: Secondary | ICD-10-CM | POA: Diagnosis not present

## 2019-03-24 DIAGNOSIS — J301 Allergic rhinitis due to pollen: Secondary | ICD-10-CM | POA: Diagnosis not present

## 2019-03-24 DIAGNOSIS — J3089 Other allergic rhinitis: Secondary | ICD-10-CM | POA: Diagnosis not present

## 2019-03-24 DIAGNOSIS — J3081 Allergic rhinitis due to animal (cat) (dog) hair and dander: Secondary | ICD-10-CM | POA: Diagnosis not present

## 2019-03-28 ENCOUNTER — Ambulatory Visit: Payer: Self-pay | Admitting: Internal Medicine

## 2019-03-29 DIAGNOSIS — J3089 Other allergic rhinitis: Secondary | ICD-10-CM | POA: Diagnosis not present

## 2019-03-29 DIAGNOSIS — J3081 Allergic rhinitis due to animal (cat) (dog) hair and dander: Secondary | ICD-10-CM | POA: Diagnosis not present

## 2019-03-29 DIAGNOSIS — J301 Allergic rhinitis due to pollen: Secondary | ICD-10-CM | POA: Diagnosis not present

## 2019-04-05 DIAGNOSIS — J301 Allergic rhinitis due to pollen: Secondary | ICD-10-CM | POA: Diagnosis not present

## 2019-04-05 DIAGNOSIS — J3081 Allergic rhinitis due to animal (cat) (dog) hair and dander: Secondary | ICD-10-CM | POA: Diagnosis not present

## 2019-04-05 DIAGNOSIS — J3089 Other allergic rhinitis: Secondary | ICD-10-CM | POA: Diagnosis not present

## 2019-04-12 ENCOUNTER — Ambulatory Visit: Payer: Self-pay | Admitting: Internal Medicine

## 2019-04-13 DIAGNOSIS — Z1231 Encounter for screening mammogram for malignant neoplasm of breast: Secondary | ICD-10-CM | POA: Diagnosis not present

## 2019-04-21 DIAGNOSIS — J3089 Other allergic rhinitis: Secondary | ICD-10-CM | POA: Diagnosis not present

## 2019-04-21 DIAGNOSIS — J3081 Allergic rhinitis due to animal (cat) (dog) hair and dander: Secondary | ICD-10-CM | POA: Diagnosis not present

## 2019-04-21 DIAGNOSIS — J301 Allergic rhinitis due to pollen: Secondary | ICD-10-CM | POA: Diagnosis not present

## 2019-04-25 ENCOUNTER — Other Ambulatory Visit: Payer: Self-pay

## 2019-04-25 DIAGNOSIS — I10 Essential (primary) hypertension: Secondary | ICD-10-CM

## 2019-04-25 MED ORDER — ATENOLOL 25 MG PO TABS: ORAL_TABLET | ORAL | 0 refills | Status: DC

## 2019-04-26 DIAGNOSIS — J3081 Allergic rhinitis due to animal (cat) (dog) hair and dander: Secondary | ICD-10-CM | POA: Diagnosis not present

## 2019-04-26 DIAGNOSIS — J301 Allergic rhinitis due to pollen: Secondary | ICD-10-CM | POA: Diagnosis not present

## 2019-04-26 DIAGNOSIS — J3089 Other allergic rhinitis: Secondary | ICD-10-CM | POA: Diagnosis not present

## 2019-05-02 ENCOUNTER — Ambulatory Visit: Payer: Self-pay | Admitting: Internal Medicine

## 2019-05-03 ENCOUNTER — Other Ambulatory Visit: Payer: Self-pay

## 2019-05-03 DIAGNOSIS — J3089 Other allergic rhinitis: Secondary | ICD-10-CM | POA: Diagnosis not present

## 2019-05-03 DIAGNOSIS — J301 Allergic rhinitis due to pollen: Secondary | ICD-10-CM | POA: Diagnosis not present

## 2019-05-03 DIAGNOSIS — J3081 Allergic rhinitis due to animal (cat) (dog) hair and dander: Secondary | ICD-10-CM | POA: Diagnosis not present

## 2019-05-03 MED ORDER — VALACYCLOVIR HCL 1 G PO TABS: 1000.0000 mg | ORAL_TABLET | Freq: Every day | ORAL | 0 refills | Status: DC

## 2019-05-03 NOTE — Telephone Encounter (Signed)
Spoke with pt she still taking valacyclovir

## 2019-05-13 DIAGNOSIS — J3081 Allergic rhinitis due to animal (cat) (dog) hair and dander: Secondary | ICD-10-CM | POA: Diagnosis not present

## 2019-05-13 DIAGNOSIS — J3089 Other allergic rhinitis: Secondary | ICD-10-CM | POA: Diagnosis not present

## 2019-05-13 DIAGNOSIS — J301 Allergic rhinitis due to pollen: Secondary | ICD-10-CM | POA: Diagnosis not present

## 2019-05-16 DIAGNOSIS — G4733 Obstructive sleep apnea (adult) (pediatric): Secondary | ICD-10-CM | POA: Diagnosis not present

## 2019-05-19 DIAGNOSIS — J3081 Allergic rhinitis due to animal (cat) (dog) hair and dander: Secondary | ICD-10-CM | POA: Diagnosis not present

## 2019-05-19 DIAGNOSIS — J301 Allergic rhinitis due to pollen: Secondary | ICD-10-CM | POA: Diagnosis not present

## 2019-05-19 DIAGNOSIS — J3089 Other allergic rhinitis: Secondary | ICD-10-CM | POA: Diagnosis not present

## 2019-05-24 DIAGNOSIS — J3081 Allergic rhinitis due to animal (cat) (dog) hair and dander: Secondary | ICD-10-CM | POA: Diagnosis not present

## 2019-05-24 DIAGNOSIS — J3089 Other allergic rhinitis: Secondary | ICD-10-CM | POA: Diagnosis not present

## 2019-05-24 DIAGNOSIS — J301 Allergic rhinitis due to pollen: Secondary | ICD-10-CM | POA: Diagnosis not present

## 2019-05-30 ENCOUNTER — Other Ambulatory Visit: Payer: Self-pay

## 2019-05-30 ENCOUNTER — Encounter: Payer: Self-pay | Admitting: Internal Medicine

## 2019-05-30 ENCOUNTER — Ambulatory Visit (INDEPENDENT_AMBULATORY_CARE_PROVIDER_SITE_OTHER): Payer: Medicare Other | Admitting: Internal Medicine

## 2019-05-30 VITALS — BP 144/96 | HR 83 | Resp 16 | Ht <= 58 in | Wt 183.0 lb

## 2019-05-30 DIAGNOSIS — Z9989 Dependence on other enabling machines and devices: Secondary | ICD-10-CM

## 2019-05-30 DIAGNOSIS — G4733 Obstructive sleep apnea (adult) (pediatric): Secondary | ICD-10-CM | POA: Diagnosis not present

## 2019-05-30 DIAGNOSIS — I10 Essential (primary) hypertension: Secondary | ICD-10-CM | POA: Diagnosis not present

## 2019-05-30 NOTE — Progress Notes (Signed)
[facility] [place], [place] [address]  Pulmonary Sleep Medicine   Office Visit Note  Patient Name: Claudia Martin DOB: 1946/02/01 MRN [id number]  Date of Service: 05/30/2019  Complaints/HPI: PT is here for follow up on osa.  She reports she is wearing her cpap nightly.  She is cleaning her machine by hand, and changing her filters and tubing as prescribed.  Overall she is doing well.  She has an inhaler, but she rarely uses it.    ROS  General: (-) fever, (-) chills, (-) night sweats, (-) weakness Skin: (-) rashes, (-) itching,. Eyes: (-) visual changes, (-) redness, (-) itching. Nose and Sinuses: (-) nasal stuffiness or itchiness, (-) postnasal drip, (-) nosebleeds, (-) sinus trouble. Mouth and Throat: (-) sore throat, (-) hoarseness. Neck: (-) swollen glands, (-) enlarged thyroid, (-) neck pain. Respiratory: - cough, (-) bloody sputum, - shortness of breath, - wheezing. Cardiovascular: - ankle swelling, (-) chest pain. Lymphatic: (-) lymph node enlargement. Neurologic: (-) numbness, (-) tingling. Psychiatric: (-) anxiety, (-) depression   Current Medication: Outpatient Encounter Medications as of 05/30/2019  Medication Sig Note  . acyclovir ointment (ZOVIRAX) 5 % APPLY TO AFFECTED AREAS 6  TIMES DAILY FOR 7 DAYS   . amLODipine (NORVASC) 2.5 MG tablet Take 1 tablet (2.5 mg total) by mouth daily.   Marland Kitchen atenolol (TENORMIN) 25 MG tablet takes 1 and 1/2 tablets twice a day.   . Blood Glucose Monitoring Suppl (ACCU-CHEK AVIVA PLUS) w/Device KIT Use as directed   . cetirizine (ZYRTEC) 10 MG tablet Take 10 mg by mouth daily.   Marland Kitchen conjugated estrogens (PREMARIN) vaginal cream Use 1 applicatorful twice weekly.   Marland Kitchen EPINEPHrine (EPIPEN 2-PAK) 0.3 mg/0.3 mL IJ SOAJ injection as needed. 07/16/2015: Received from: [place]  . ergocalciferol (DRISDOL) 1.25 MG (50000 UT) capsule Take 1 capsule (50,000 Units total) by mouth once a week.    . fluconazole (DIFLUCAN) 150 MG tablet take 1 tablet by mouth once daily for 3 days 07/16/2015: Received from: External Pharmacy  . ketoconazole (NIZORAL) 2 % cream Apply 1 application topically daily.   Marland Kitchen levothyroxine (SYNTHROID) 50 MCG tablet Take 1 tablet (50 mcg total) by mouth daily before breakfast.   . medroxyPROGESTERone (PROVERA) 2.5 MG tablet once daily. 07/16/2015: Received from: [place]  . metroNIDAZOLE (METROGEL) 0.75 % vaginal gel  07/16/2015: Received from: [facility]  . montelukast (SINGULAIR) 10 MG tablet Take by mouth.   . naproxen sodium (ALEVE) 220 MG tablet Take by mouth. 07/16/2015: Received from: [facility]  . nitrofurantoin, macrocrystal-monohydrate, (MACROBID) 100 MG capsule Take 1 capsule (100 mg total) by mouth 2 (two) times daily.   Marland Kitchen nystatin cream (MYCOSTATIN) Apply 1 application topically 2 (two) times daily.   Marland Kitchen olopatadine (PATANOL) 0.1 % ophthalmic solution instill 1 drop into both eyes twice a day for ITCHY EYES 07/16/2015: Received from: External Pharmacy  . Olopatadine HCl (PATADAY) 0.2 % SOLN Place 1 drop into both eyes 2 (two) times daily.   . valACYclovir (VALTREX) 1000 MG tablet Take 1 tablet (1,000 mg total) by mouth daily. Pt can 1 tablet twice a day if flare up for 7 days.    No facility-administered encounter medications on file as of 05/30/2019.     Surgical History: Past Surgical History:  Procedure Laterality Date  . BREAST BIOPSY    . COLON SURGERY    . COLONOSCOPY WITH PROPOFOL N/A 03/15/2018  Procedure: COLONOSCOPY WITH PROPOFOL;  Surgeon: Lin Landsman, MD;  Location: [facility] ENDOSCOPY;  Service: Gastroenterology;  Laterality: N/A;  . diverticulitis    . ECTOPIC PREGNANCY SURGERY    . fx thumb    . HERNIA REPAIR      Medical History: Past Medical History:  Diagnosis Date  . Abdominal pain   . Allergic rhinitis   . Candidiasis of vulva and vagina   . Cellulitis and  abscess   . Diverticulosis   . Dysuria   . Eyelid inflammation   . Hematuria syndrome   . Hemorrhoids   . Herpes genitalis   . Herpes simplex   . HTN (hypertension)   . Hydronephrosis   . Hypothyroidism   . Incomplete bladder emptying   . Internal hordeolum   . Knee pain   . Otalgia   . Primary ovarian failure   . Sciatica   . Shortness of breath   . Urethral diverticulum   . UTI (lower urinary tract infection)   . Vaginal atrophy   . Vaginitis     Family History: Family History  Problem Relation Age of Onset  . Tuberculosis Mother   . Nephrolithiasis Brother     Social History: Social History   Socioeconomic History  . Marital status: Divorced    Spouse name: Not on file  . Number of children: Not on file  . Years of education: Not on file  . Highest education level: Not on file  Occupational History  . Not on file  Social Needs  . Financial resource strain: Not on file  . Food insecurity    Worry: Not on file    Inability: Not on file  . Transportation needs    Medical: Not on file    Non-medical: Not on file  Tobacco Use  . Smoking status: Never Smoker  . Smokeless tobacco: Never Used  Substance and Sexual Activity  . Alcohol use: No  . Drug use: Never  . Sexual activity: Not on file  Lifestyle  . Physical activity    Days per week: Not on file    Minutes per session: Not on file  . Stress: Not on file  Relationships  . Social Herbalist on phone: Not on file    Gets together: Not on file    Attends religious service: Not on file    Active member of club or organization: Not on file    Attends meetings of clubs or organizations: Not on file    Relationship status: Not on file  . Intimate partner violence    Fear of current or ex partner: Not on file    Emotionally abused: Not on file    Physically abused: Not on file    Forced sexual activity: Not on file  Other Topics Concern  . Not on file  Social History Narrative  . Not on  file    Vital Signs: Blood pressure (!) 144/96, pulse 83, resp. rate 16, height 4' 7"  (1.397 m), weight 183 lb (83 kg), SpO2 98 %.  Examination: General Appearance: The patient is well-developed, well-nourished, and in no distress. Skin: Gross inspection of skin unremarkable. Head: normocephalic, no gross deformities. Eyes: no gross deformities noted. ENT: ears appear grossly normal no exudates. Neck: Supple. No thyromegaly. No LAD. Respiratory: clear bilaterlly. Cardiovascular: Normal S1 and S2 without murmur or rub. Extremities: No cyanosis. pulses are equal. Neurologic: Alert and oriented. No involuntary movements.  LABS: No results found  for this or any previous visit (from the past 2160 hour(s)).  Radiology: No results found.  No results found.  No results found.    Assessment and Plan: Patient Active Problem List   Diagnosis Date Noted  . Vitamin B12 deficiency 06/30/2018  . Unspecified menopausal and perimenopausal disorder 06/30/2018  . Vitamin D deficiency 06/30/2018  . Flu vaccine need 06/30/2018  . Urinary tract infection with hematuria 02/05/2018  . Acquired hypothyroidism 02/05/2018  . Essential hypertension 02/05/2018  . Screening for breast cancer 02/05/2018  . Dysuria 02/05/2018  . Obstructive sleep apnea (adult) (pediatric) 01/06/2018    1. OSA on CPAP Continue to use cpap as discussed.  She has excellent symptom relief with therapy.   2. Essential hypertension Slightly elevated, discussed medication compliance. She should log bp at home and bring to pcp appt.  3. Morbid obesity ([place]) Obesity Counseling: Risk Assessment: An assessment of behavioral risk factors was made today and includes lack of exercise sedentary lifestyle, lack of portion control and poor dietary habits.  Risk Modification Advice: She was counseled on portion control guidelines. Restricting daily caloric intake to. . The detrimental long term effects of obesity on her health  and ongoing poor compliance was also discussed with the patient.    General Counseling: I have discussed the findings of the evaluation and examination with Verdis Frederickson.  I have also discussed any further diagnostic evaluation thatmay be needed or ordered today. Skila verbalizes understanding of the findings of todays visit. We also reviewed her medications today and discussed drug interactions and side effects including but not limited excessive drowsiness and altered mental states. We also discussed that there is always a risk not just to her but also people around her. she has been encouraged to call the office with any questions or concerns that should arise related to todays visit.    Time spent: 20  I have personally obtained a history, examined the patient, evaluated laboratory and imaging results, formulated the assessment and plan and placed orders.    Allyne Gee, MD [facility] Pulmonary and Critical Care Sleep medicine

## 2019-05-31 DIAGNOSIS — J3081 Allergic rhinitis due to animal (cat) (dog) hair and dander: Secondary | ICD-10-CM | POA: Diagnosis not present

## 2019-05-31 DIAGNOSIS — J3089 Other allergic rhinitis: Secondary | ICD-10-CM | POA: Diagnosis not present

## 2019-05-31 DIAGNOSIS — J301 Allergic rhinitis due to pollen: Secondary | ICD-10-CM | POA: Diagnosis not present

## 2019-06-08 ENCOUNTER — Other Ambulatory Visit: Payer: Self-pay

## 2019-06-08 DIAGNOSIS — E559 Vitamin D deficiency, unspecified: Secondary | ICD-10-CM

## 2019-06-08 MED ORDER — ERGOCALCIFEROL 1.25 MG (50000 UT) PO CAPS: 50000.0000 [IU] | ORAL_CAPSULE | ORAL | 3 refills | Status: DC

## 2019-06-10 DIAGNOSIS — J3089 Other allergic rhinitis: Secondary | ICD-10-CM | POA: Diagnosis not present

## 2019-06-10 DIAGNOSIS — J301 Allergic rhinitis due to pollen: Secondary | ICD-10-CM | POA: Diagnosis not present

## 2019-06-10 DIAGNOSIS — J3081 Allergic rhinitis due to animal (cat) (dog) hair and dander: Secondary | ICD-10-CM | POA: Diagnosis not present

## 2019-06-14 ENCOUNTER — Other Ambulatory Visit: Payer: Self-pay | Admitting: Internal Medicine

## 2019-06-14 DIAGNOSIS — J301 Allergic rhinitis due to pollen: Secondary | ICD-10-CM | POA: Diagnosis not present

## 2019-06-14 DIAGNOSIS — J3089 Other allergic rhinitis: Secondary | ICD-10-CM | POA: Diagnosis not present

## 2019-06-14 DIAGNOSIS — I10 Essential (primary) hypertension: Secondary | ICD-10-CM

## 2019-06-14 DIAGNOSIS — J3081 Allergic rhinitis due to animal (cat) (dog) hair and dander: Secondary | ICD-10-CM | POA: Diagnosis not present

## 2019-06-14 MED ORDER — ATENOLOL 25 MG PO TABS: ORAL_TABLET | ORAL | 3 refills | Status: DC

## 2019-06-21 DIAGNOSIS — J3089 Other allergic rhinitis: Secondary | ICD-10-CM | POA: Diagnosis not present

## 2019-06-21 DIAGNOSIS — J301 Allergic rhinitis due to pollen: Secondary | ICD-10-CM | POA: Diagnosis not present

## 2019-06-21 DIAGNOSIS — J3081 Allergic rhinitis due to animal (cat) (dog) hair and dander: Secondary | ICD-10-CM | POA: Diagnosis not present

## 2019-06-30 DIAGNOSIS — J3089 Other allergic rhinitis: Secondary | ICD-10-CM | POA: Diagnosis not present

## 2019-06-30 DIAGNOSIS — J301 Allergic rhinitis due to pollen: Secondary | ICD-10-CM | POA: Diagnosis not present

## 2019-06-30 DIAGNOSIS — J3081 Allergic rhinitis due to animal (cat) (dog) hair and dander: Secondary | ICD-10-CM | POA: Diagnosis not present

## 2019-07-05 ENCOUNTER — Ambulatory Visit: Payer: Medicare Other | Admitting: Nurse Practitioner

## 2019-07-07 DIAGNOSIS — J301 Allergic rhinitis due to pollen: Secondary | ICD-10-CM | POA: Diagnosis not present

## 2019-07-07 DIAGNOSIS — J3089 Other allergic rhinitis: Secondary | ICD-10-CM | POA: Diagnosis not present

## 2019-07-07 DIAGNOSIS — J3081 Allergic rhinitis due to animal (cat) (dog) hair and dander: Secondary | ICD-10-CM | POA: Diagnosis not present

## 2019-07-13 ENCOUNTER — Other Ambulatory Visit: Payer: Self-pay

## 2019-07-13 DIAGNOSIS — N959 Unspecified menopausal and perimenopausal disorder: Secondary | ICD-10-CM

## 2019-07-13 MED ORDER — ESTROGENS, CONJUGATED 0.625 MG/GM VA CREA: TOPICAL_CREAM | VAGINAL | 4 refills | Status: DC

## 2019-07-14 ENCOUNTER — Other Ambulatory Visit: Payer: Self-pay

## 2019-07-14 ENCOUNTER — Telehealth: Payer: Self-pay

## 2019-07-14 DIAGNOSIS — J301 Allergic rhinitis due to pollen: Secondary | ICD-10-CM | POA: Diagnosis not present

## 2019-07-14 DIAGNOSIS — J3081 Allergic rhinitis due to animal (cat) (dog) hair and dander: Secondary | ICD-10-CM | POA: Diagnosis not present

## 2019-07-14 DIAGNOSIS — J3089 Other allergic rhinitis: Secondary | ICD-10-CM | POA: Diagnosis not present

## 2019-07-14 MED ORDER — CHLORHEXIDINE GLUCONATE 0.12 % MT SOLN: 5.0000 mL | Freq: Two times a day (BID) | OROMUCOSAL | 0 refills | Status: DC

## 2019-07-14 NOTE — Telephone Encounter (Signed)
lmom to call us back

## 2019-07-18 ENCOUNTER — Other Ambulatory Visit: Payer: Self-pay

## 2019-07-18 DIAGNOSIS — I10 Essential (primary) hypertension: Secondary | ICD-10-CM

## 2019-07-18 MED ORDER — ATENOLOL 25 MG PO TABS: ORAL_TABLET | ORAL | 0 refills | Status: DC

## 2019-07-19 ENCOUNTER — Other Ambulatory Visit: Payer: Self-pay

## 2019-07-19 DIAGNOSIS — I10 Essential (primary) hypertension: Secondary | ICD-10-CM

## 2019-07-19 MED ORDER — ATENOLOL 25 MG PO TABS: ORAL_TABLET | ORAL | 0 refills | Status: DC

## 2019-07-21 ENCOUNTER — Other Ambulatory Visit: Payer: Self-pay | Admitting: Nurse Practitioner

## 2019-07-21 DIAGNOSIS — J3081 Allergic rhinitis due to animal (cat) (dog) hair and dander: Secondary | ICD-10-CM | POA: Diagnosis not present

## 2019-07-21 DIAGNOSIS — J3089 Other allergic rhinitis: Secondary | ICD-10-CM | POA: Diagnosis not present

## 2019-07-21 DIAGNOSIS — I10 Essential (primary) hypertension: Secondary | ICD-10-CM

## 2019-07-21 DIAGNOSIS — J301 Allergic rhinitis due to pollen: Secondary | ICD-10-CM | POA: Diagnosis not present

## 2019-07-21 MED ORDER — ATENOLOL 25 MG PO TABS: ORAL_TABLET | ORAL | 1 refills | Status: DC

## 2019-07-26 DIAGNOSIS — J301 Allergic rhinitis due to pollen: Secondary | ICD-10-CM | POA: Diagnosis not present

## 2019-07-26 DIAGNOSIS — J3081 Allergic rhinitis due to animal (cat) (dog) hair and dander: Secondary | ICD-10-CM | POA: Diagnosis not present

## 2019-07-26 DIAGNOSIS — J3089 Other allergic rhinitis: Secondary | ICD-10-CM | POA: Diagnosis not present

## 2019-08-02 DIAGNOSIS — J301 Allergic rhinitis due to pollen: Secondary | ICD-10-CM | POA: Diagnosis not present

## 2019-08-02 DIAGNOSIS — J3081 Allergic rhinitis due to animal (cat) (dog) hair and dander: Secondary | ICD-10-CM | POA: Diagnosis not present

## 2019-08-02 DIAGNOSIS — J3089 Other allergic rhinitis: Secondary | ICD-10-CM | POA: Diagnosis not present

## 2019-08-04 DIAGNOSIS — J3089 Other allergic rhinitis: Secondary | ICD-10-CM | POA: Diagnosis not present

## 2019-08-04 DIAGNOSIS — J3081 Allergic rhinitis due to animal (cat) (dog) hair and dander: Secondary | ICD-10-CM | POA: Diagnosis not present

## 2019-08-04 DIAGNOSIS — J301 Allergic rhinitis due to pollen: Secondary | ICD-10-CM | POA: Diagnosis not present

## 2019-08-09 DIAGNOSIS — J3089 Other allergic rhinitis: Secondary | ICD-10-CM | POA: Diagnosis not present

## 2019-08-09 DIAGNOSIS — J3081 Allergic rhinitis due to animal (cat) (dog) hair and dander: Secondary | ICD-10-CM | POA: Diagnosis not present

## 2019-08-09 DIAGNOSIS — J301 Allergic rhinitis due to pollen: Secondary | ICD-10-CM | POA: Diagnosis not present

## 2019-08-11 ENCOUNTER — Ambulatory Visit: Payer: Medicare Other | Admitting: Nurse Practitioner

## 2019-08-15 DIAGNOSIS — G4733 Obstructive sleep apnea (adult) (pediatric): Secondary | ICD-10-CM | POA: Diagnosis not present

## 2019-08-16 DIAGNOSIS — J3081 Allergic rhinitis due to animal (cat) (dog) hair and dander: Secondary | ICD-10-CM | POA: Diagnosis not present

## 2019-08-16 DIAGNOSIS — J3089 Other allergic rhinitis: Secondary | ICD-10-CM | POA: Diagnosis not present

## 2019-08-16 DIAGNOSIS — J301 Allergic rhinitis due to pollen: Secondary | ICD-10-CM | POA: Diagnosis not present

## 2019-08-23 DIAGNOSIS — J3081 Allergic rhinitis due to animal (cat) (dog) hair and dander: Secondary | ICD-10-CM | POA: Diagnosis not present

## 2019-08-23 DIAGNOSIS — J301 Allergic rhinitis due to pollen: Secondary | ICD-10-CM | POA: Diagnosis not present

## 2019-08-23 DIAGNOSIS — J3089 Other allergic rhinitis: Secondary | ICD-10-CM | POA: Diagnosis not present

## 2019-08-29 ENCOUNTER — Telehealth: Payer: Self-pay

## 2019-08-29 NOTE — Telephone Encounter (Signed)
Called confirmed appointment with patient. klh 

## 2019-08-30 ENCOUNTER — Other Ambulatory Visit: Payer: Self-pay

## 2019-08-30 DIAGNOSIS — J301 Allergic rhinitis due to pollen: Secondary | ICD-10-CM | POA: Diagnosis not present

## 2019-08-30 DIAGNOSIS — E559 Vitamin D deficiency, unspecified: Secondary | ICD-10-CM

## 2019-08-30 DIAGNOSIS — J3089 Other allergic rhinitis: Secondary | ICD-10-CM | POA: Diagnosis not present

## 2019-08-30 DIAGNOSIS — E039 Hypothyroidism, unspecified: Secondary | ICD-10-CM

## 2019-08-30 DIAGNOSIS — J3081 Allergic rhinitis due to animal (cat) (dog) hair and dander: Secondary | ICD-10-CM | POA: Diagnosis not present

## 2019-08-30 DIAGNOSIS — I10 Essential (primary) hypertension: Secondary | ICD-10-CM

## 2019-08-30 MED ORDER — AMLODIPINE BESYLATE 2.5 MG PO TABS: 2.5000 mg | ORAL_TABLET | Freq: Every day | ORAL | 1 refills | Status: DC

## 2019-08-30 MED ORDER — LEVOTHYROXINE SODIUM 50 MCG PO TABS: 50.0000 ug | ORAL_TABLET | Freq: Every day | ORAL | 1 refills | Status: DC

## 2019-08-31 ENCOUNTER — Ambulatory Visit (INDEPENDENT_AMBULATORY_CARE_PROVIDER_SITE_OTHER): Payer: Medicare Other

## 2019-08-31 ENCOUNTER — Other Ambulatory Visit: Payer: Self-pay

## 2019-08-31 DIAGNOSIS — G4733 Obstructive sleep apnea (adult) (pediatric): Secondary | ICD-10-CM | POA: Diagnosis not present

## 2019-08-31 NOTE — Progress Notes (Signed)
95 percentile pressure 7   95th percentile leak 0   apnea index 1.3 /hr  apnea-hypopnea index  2.4 /hr   total days used  >4 hr 90 days  total days used <4 hr 0 days  Total compliance 100 percent  No problems or questions at this time

## 2019-09-06 DIAGNOSIS — J3081 Allergic rhinitis due to animal (cat) (dog) hair and dander: Secondary | ICD-10-CM | POA: Diagnosis not present

## 2019-09-06 DIAGNOSIS — J3089 Other allergic rhinitis: Secondary | ICD-10-CM | POA: Diagnosis not present

## 2019-09-06 DIAGNOSIS — J301 Allergic rhinitis due to pollen: Secondary | ICD-10-CM | POA: Diagnosis not present

## 2019-09-07 ENCOUNTER — Other Ambulatory Visit: Payer: Self-pay

## 2019-09-07 MED ORDER — VALACYCLOVIR HCL 1 G PO TABS: 1000.0000 mg | ORAL_TABLET | Freq: Every day | ORAL | 0 refills | Status: DC

## 2019-09-15 DIAGNOSIS — J3089 Other allergic rhinitis: Secondary | ICD-10-CM | POA: Diagnosis not present

## 2019-09-15 DIAGNOSIS — J301 Allergic rhinitis due to pollen: Secondary | ICD-10-CM | POA: Diagnosis not present

## 2019-09-15 DIAGNOSIS — J3081 Allergic rhinitis due to animal (cat) (dog) hair and dander: Secondary | ICD-10-CM | POA: Diagnosis not present

## 2019-09-16 ENCOUNTER — Telehealth: Payer: Self-pay

## 2019-09-16 NOTE — Telephone Encounter (Signed)
LMOM TO CONFIRM AND SCREEN FOR 09-20-19 OV.

## 2019-09-16 NOTE — Telephone Encounter (Signed)
Rescheduled appointment on 09/20/2019 to 10/24/2019. klh

## 2019-09-20 ENCOUNTER — Ambulatory Visit: Payer: Medicare Other | Admitting: Nurse Practitioner

## 2019-09-20 DIAGNOSIS — J3089 Other allergic rhinitis: Secondary | ICD-10-CM | POA: Diagnosis not present

## 2019-09-20 DIAGNOSIS — J301 Allergic rhinitis due to pollen: Secondary | ICD-10-CM | POA: Diagnosis not present

## 2019-09-20 DIAGNOSIS — J3081 Allergic rhinitis due to animal (cat) (dog) hair and dander: Secondary | ICD-10-CM | POA: Diagnosis not present

## 2019-09-22 DIAGNOSIS — J3081 Allergic rhinitis due to animal (cat) (dog) hair and dander: Secondary | ICD-10-CM | POA: Diagnosis not present

## 2019-09-22 DIAGNOSIS — J301 Allergic rhinitis due to pollen: Secondary | ICD-10-CM | POA: Diagnosis not present

## 2019-09-22 DIAGNOSIS — J3089 Other allergic rhinitis: Secondary | ICD-10-CM | POA: Diagnosis not present

## 2019-09-27 DIAGNOSIS — J3081 Allergic rhinitis due to animal (cat) (dog) hair and dander: Secondary | ICD-10-CM | POA: Diagnosis not present

## 2019-09-27 DIAGNOSIS — J301 Allergic rhinitis due to pollen: Secondary | ICD-10-CM | POA: Diagnosis not present

## 2019-09-27 DIAGNOSIS — J3089 Other allergic rhinitis: Secondary | ICD-10-CM | POA: Diagnosis not present

## 2019-10-03 ENCOUNTER — Other Ambulatory Visit: Payer: Self-pay

## 2019-10-03 DIAGNOSIS — E559 Vitamin D deficiency, unspecified: Secondary | ICD-10-CM

## 2019-10-03 MED ORDER — ERGOCALCIFEROL 1.25 MG (50000 UT) PO CAPS: 50000.0000 [IU] | ORAL_CAPSULE | ORAL | 3 refills | Status: DC

## 2019-10-04 DIAGNOSIS — J3089 Other allergic rhinitis: Secondary | ICD-10-CM | POA: Diagnosis not present

## 2019-10-04 DIAGNOSIS — J301 Allergic rhinitis due to pollen: Secondary | ICD-10-CM | POA: Diagnosis not present

## 2019-10-04 DIAGNOSIS — J3081 Allergic rhinitis due to animal (cat) (dog) hair and dander: Secondary | ICD-10-CM | POA: Diagnosis not present

## 2019-10-07 DIAGNOSIS — Z8614 Personal history of Methicillin resistant Staphylococcus aureus infection: Secondary | ICD-10-CM

## 2019-10-07 HISTORY — DX: Personal history of Methicillin resistant Staphylococcus aureus infection: Z86.14

## 2019-10-13 DIAGNOSIS — J3089 Other allergic rhinitis: Secondary | ICD-10-CM | POA: Diagnosis not present

## 2019-10-13 DIAGNOSIS — J3081 Allergic rhinitis due to animal (cat) (dog) hair and dander: Secondary | ICD-10-CM | POA: Diagnosis not present

## 2019-10-13 DIAGNOSIS — J301 Allergic rhinitis due to pollen: Secondary | ICD-10-CM | POA: Diagnosis not present

## 2019-10-18 DIAGNOSIS — J301 Allergic rhinitis due to pollen: Secondary | ICD-10-CM | POA: Diagnosis not present

## 2019-10-18 DIAGNOSIS — J3081 Allergic rhinitis due to animal (cat) (dog) hair and dander: Secondary | ICD-10-CM | POA: Diagnosis not present

## 2019-10-18 DIAGNOSIS — J3089 Other allergic rhinitis: Secondary | ICD-10-CM | POA: Diagnosis not present

## 2019-10-18 DIAGNOSIS — H1045 Other chronic allergic conjunctivitis: Secondary | ICD-10-CM | POA: Diagnosis not present

## 2019-10-20 ENCOUNTER — Telehealth: Payer: Self-pay

## 2019-10-20 NOTE — Telephone Encounter (Signed)
Rescheduled cpe to 11-2019 and confirmed 10-24-19 ov as virtual.

## 2019-10-24 ENCOUNTER — Ambulatory Visit (INDEPENDENT_AMBULATORY_CARE_PROVIDER_SITE_OTHER): Payer: Medicare Other | Admitting: Nurse Practitioner

## 2019-10-24 ENCOUNTER — Ambulatory Visit: Payer: Medicare Other | Admitting: Nurse Practitioner

## 2019-10-24 ENCOUNTER — Encounter: Payer: Self-pay | Admitting: Nurse Practitioner

## 2019-10-24 VITALS — Ht <= 58 in | Wt 183.0 lb

## 2019-10-24 DIAGNOSIS — B009 Herpesviral infection, unspecified: Secondary | ICD-10-CM

## 2019-10-24 DIAGNOSIS — E039 Hypothyroidism, unspecified: Secondary | ICD-10-CM | POA: Diagnosis not present

## 2019-10-24 DIAGNOSIS — I10 Essential (primary) hypertension: Secondary | ICD-10-CM | POA: Diagnosis not present

## 2019-10-24 DIAGNOSIS — K121 Other forms of stomatitis: Secondary | ICD-10-CM | POA: Diagnosis not present

## 2019-10-24 DIAGNOSIS — Z1159 Encounter for screening for other viral diseases: Secondary | ICD-10-CM

## 2019-10-24 DIAGNOSIS — K1230 Oral mucositis (ulcerative), unspecified: Secondary | ICD-10-CM

## 2019-10-24 MED ORDER — LEVOTHYROXINE SODIUM 50 MCG PO TABS: 50.0000 ug | ORAL_TABLET | Freq: Every day | ORAL | 1 refills | Status: DC

## 2019-10-24 MED ORDER — CHLORHEXIDINE GLUCONATE 0.12 % MT SOLN: 5.0000 mL | Freq: Two times a day (BID) | OROMUCOSAL | 0 refills | Status: DC

## 2019-10-24 MED ORDER — ATENOLOL 25 MG PO TABS: ORAL_TABLET | ORAL | 1 refills | Status: DC

## 2019-10-24 MED ORDER — VALACYCLOVIR HCL 1 G PO TABS: 1000.0000 mg | ORAL_TABLET | Freq: Every day | ORAL | 1 refills | Status: DC

## 2019-10-24 MED ORDER — AMLODIPINE BESYLATE 2.5 MG PO TABS: 2.5000 mg | ORAL_TABLET | Freq: Every day | ORAL | 1 refills | Status: DC

## 2019-10-24 NOTE — Progress Notes (Signed)
[facility] [place], [place] [address]  Internal MEDICINE  Telephone Visit  Patient Name: Claudia Martin  [id number]  [id number]  Date of Service: 10/27/2019  I connected with the patient at 3:18pm by telephone and verified the patients identity using two identifiers.   I discussed the limitations, risks, security and privacy concerns of performing an evaluation and management service by telephone and the availability of in person appointments. I also discussed with the patient that there may be a patient responsible charge related to the service.  The patient expressed understanding and agrees to proceed.    Chief Complaint  Patient presents with  . Telephone Assessment  . Telephone Screen  . Hypothyroidism  . Medication Refill    ATENOLOL ([facility]), LEVOTHYROXINE ([facility]), AMLODIPINE ([facility]), VALCYCLOVIR ([facility]), CHLORHEXADINE ([facility])    .The patient has been contacted via telephone for follow up visit due to concerns for spread of novel coronavirus. The patient presents for follow up visit. She states that she has been doing well. Blood pressure is well managed. She has no new concerns or complaints. She states that she needs to have refills for several of her routine medications today.      Current Medication: Outpatient Encounter Medications as of 10/24/2019  Medication Sig Note  . acyclovir ointment (ZOVIRAX) 5 % APPLY TO AFFECTED AREAS 6  TIMES DAILY FOR 7 DAYS   . Blood Glucose Monitoring Suppl (ACCU-CHEK AVIVA PLUS) w/Device KIT Use as directed   . cetirizine (ZYRTEC) 10 MG tablet Take 10 mg by mouth daily.   Marland Kitchen conjugated estrogens (PREMARIN) vaginal cream Use 1 applicatorful twice weekly.   Marland Kitchen EPINEPHrine (EPIPEN 2-PAK) 0.3 mg/0.3 mL IJ SOAJ injection as needed. 07/16/2015: Received from: [employer]  . ergocalciferol (DRISDOL) 1.25 MG (50000 UT) capsule Take 1 capsule (50,000 Units total) by mouth once a week.    . fluconazole (DIFLUCAN) 150 MG tablet take 1 tablet by mouth once daily for 3 days 07/16/2015: Received from: External Pharmacy  . ketoconazole (NIZORAL) 2 % cream Apply 1 application topically daily.   . medroxyPROGESTERone (PROVERA) 2.5 MG tablet once daily. 07/16/2015: Received from: [facility]  . metroNIDAZOLE (METROGEL) 0.75 % vaginal gel  07/16/2015: Received from: [facility]  . montelukast (SINGULAIR) 10 MG tablet Take by mouth.   . naproxen sodium (ALEVE) 220 MG tablet Take by mouth. 07/16/2015: Received from: [facility]  . nitrofurantoin, macrocrystal-monohydrate, (MACROBID) 100 MG capsule Take 1 capsule (100 mg total) by mouth 2 (two) times daily.   Marland Kitchen nystatin cream (MYCOSTATIN) Apply 1 application topically 2 (two) times daily.   Marland Kitchen olopatadine (PATANOL) 0.1 % ophthalmic solution instill 1 drop into both eyes twice a day for ITCHY EYES 07/16/2015: Received from: External Pharmacy  . Olopatadine HCl (PATADAY) 0.2 % SOLN Place 1 drop into both eyes 2 (two) times daily.   . [DISCONTINUED] amLODipine (NORVASC) 2.5 MG tablet Take 1 tablet (2.5 mg total) by mouth daily.   . [DISCONTINUED] atenolol (TENORMIN) 25 MG tablet takes 1 and 1/2 tablets twice a day.   . [DISCONTINUED] chlorhexidine (PERIDEX) 0.12 % solution Use as directed 5 mLs in the mouth or throat 2 (two) times daily.   . [DISCONTINUED] levothyroxine (SYNTHROID) 50 MCG tablet Take 1 tablet (50 mcg total) by mouth daily before breakfast.   . [DISCONTINUED] valACYclovir (VALTREX) 1000 MG tablet Take 1 tablet (1,000 mg total) by mouth daily. Pt can 1 tablet twice  a day if flare up for 7 days.   Marland Kitchen amLODipine (NORVASC) 2.5 MG tablet Take 1 tablet (2.5 mg total) by mouth daily.   Marland Kitchen atenolol (TENORMIN) 25 MG tablet takes 1 and 1/2 tablets twice a day.   . chlorhexidine (PERIDEX) 0.12 % solution Use as directed 5 mLs in the mouth or throat 2 (two) times daily.   Marland Kitchen levothyroxine  (SYNTHROID) 50 MCG tablet Take 1 tablet (50 mcg total) by mouth daily before breakfast.   . valACYclovir (VALTREX) 1000 MG tablet Take 1 tablet (1,000 mg total) by mouth daily. Pt can 1 tablet twice a day if flare up for 7 days.    No facility-administered encounter medications on file as of 10/24/2019.    Surgical History: Past Surgical History:  Procedure Laterality Date  . BREAST BIOPSY    . COLON SURGERY    . COLONOSCOPY WITH PROPOFOL N/A 03/15/2018   Procedure: COLONOSCOPY WITH PROPOFOL;  Surgeon: Lin Landsman, MD;  Location: [facility] ENDOSCOPY;  Service: Gastroenterology;  Laterality: N/A;  . diverticulitis    . ECTOPIC PREGNANCY SURGERY    . fx thumb    . HERNIA REPAIR      Medical History: Past Medical History:  Diagnosis Date  . Abdominal pain   . Allergic rhinitis   . Candidiasis of vulva and vagina   . Cellulitis and abscess   . Diverticulosis   . Dysuria   . Eyelid inflammation   . Hematuria syndrome   . Hemorrhoids   . Herpes genitalis   . Herpes simplex   . HTN (hypertension)   . Hydronephrosis   . Hypothyroidism   . Incomplete bladder emptying   . Internal hordeolum   . Knee pain   . Otalgia   . Primary ovarian failure   . Sciatica   . Shortness of breath   . Urethral diverticulum   . UTI (lower urinary tract infection)   . Vaginal atrophy   . Vaginitis     Family History: Family History  Problem Relation Age of Onset  . Tuberculosis Mother   . Nephrolithiasis Brother     Social History   Socioeconomic History  . Marital status: Divorced    Spouse name: Not on file  . Number of children: Not on file  . Years of education: Not on file  . Highest education level: Not on file  Occupational History  . Not on file  Tobacco Use  . Smoking status: Never Smoker  . Smokeless tobacco: Never Used  Substance and Sexual Activity  . Alcohol use: No  . Drug use: Never  . Sexual activity: Not on file  Other Topics Concern  . Not on file   Social History Narrative  . Not on file   Social Determinants of Health   Financial Resource Strain:   . Difficulty of Paying Living Expenses: Not on file  Food Insecurity:   . Worried About Charity fundraiser in the Last Year: Not on file  . Ran Out of Food in the Last Year: Not on file  Transportation Needs:   . Lack of Transportation (Medical): Not on file  . Lack of Transportation (Non-Medical): Not on file  Physical Activity:   . Days of Exercise per Week: Not on file  . Minutes of Exercise per Session: Not on file  Stress:   . Feeling of Stress : Not on file  Social Connections:   . Frequency of Communication with Friends and Family: Not on  file  . Frequency of Social Gatherings with Friends and Family: Not on file  . Attends Religious Services: Not on file  . Active Member of Clubs or Organizations: Not on file  . Attends Archivist Meetings: Not on file  . Marital Status: Not on file  Intimate Partner Violence:   . Fear of Current or Ex-Partner: Not on file  . Emotionally Abused: Not on file  . Physically Abused: Not on file  . Sexually Abused: Not on file      Review of Systems  Constitutional: Negative for activity change, chills, fatigue and unexpected weight change.  HENT: Negative for congestion, postnasal drip, rhinorrhea, sneezing, sore throat and voice change.   Respiratory: Negative for cough, chest tightness, shortness of breath and wheezing.   Cardiovascular: Negative for chest pain and palpitations.  Gastrointestinal: Negative for abdominal pain, constipation, diarrhea, nausea and vomiting.  Endocrine: Negative for cold intolerance, heat intolerance, polydipsia and polyuria.       Well controlled hypothyroid.  Musculoskeletal: Positive for arthralgias. Negative for back pain, joint swelling and neck pain.       Bilateral knee pain  Skin: Negative for rash.  Allergic/Immunologic: Positive for environmental allergies.  Neurological:  Negative for tremors, numbness and headaches.  Hematological: Negative for adenopathy. Does not bruise/bleed easily.  Psychiatric/Behavioral: Negative for behavioral problems (Depression), sleep disturbance and suicidal ideas. The patient is not nervous/anxious.     Today's Vitals   10/24/19 1442  Weight: 183 lb (83 kg)  Height: 4' 7"  (1.397 m)   Body mass index is 42.53 kg/m.  Observation/Objective:   The patient is alert and oriented. She is pleasant and answers all questions appropriately. Breathing is non-labored. She is in no acute distress at this time.    Assessment/Plan:  1. Essential hypertension Blood pressure stable. Continue bp medication as prescribed. Refills sent to mail order pharmacy.  - amLODipine (NORVASC) 2.5 MG tablet; Take 1 tablet (2.5 mg total) by mouth daily.  Dispense: 90 tablet; Refill: 1 - atenolol (TENORMIN) 25 MG tablet; takes 1 and 1/2 tablets twice a day.  Dispense: 180 tablet; Refill: 1  2. Acquired hypothyroidism Continue levothyroxine as prescribed.  - levothyroxine (SYNTHROID) 50 MCG tablet; Take 1 tablet (50 mcg total) by mouth daily before breakfast.  Dispense: 90 tablet; Refill: 1  3. Herpes simplex infection Patient should continue valacyclovir 1067m daily to prevent flare of herpes simplex.  - valACYclovir (VALTREX) 1000 MG tablet; Take 1 tablet (1,000 mg total) by mouth daily. Pt can 1 tablet twice a day if flare up for 7 days.  Dispense: 90 tablet; Refill: 1  4. Stomatitis and mucositis Swish and spit chlorhexidine mouth wash twice daily as needed.  - chlorhexidine (PERIDEX) 0.12 % solution; Use as directed 5 mLs in the mouth or throat 2 (two) times daily.  Dispense: 120 mL; Refill: 0  5. Encounter for hepatitis C screening test for low risk patient Screen for hepatitis C with routine, fasting labs.   General Counseling: Mcuma polyakovunderstanding of the findings of today's phone visit and agrees with plan of treatment. I have  discussed any further diagnostic evaluation that may be needed or ordered today. We also reviewed her medications today. she has been encouraged to call the office with any questions or concerns that should arise related to todays visit.   Hypertension Counseling:   The following hypertensive lifestyle modification were recommended and discussed:  1. Limiting alcohol intake to less than 1 oz/day of ethanol:(24  oz of beer or 8 oz of wine or 2 oz of 100-proof whiskey). 2. Take baby ASA 81 mg daily. 3. Importance of regular aerobic exercise and losing weight. 4. Reduce dietary saturated fat and cholesterol intake for overall cardiovascular health. 5. Maintaining adequate dietary potassium, calcium, and magnesium intake. 6. Regular monitoring of the blood pressure. 7. Reduce sodium intake to less than 100 mmol/day (less than 2.3 gm of sodium or less than 6 gm of sodium choride)   This patient was seen by [place] with Dr Lavera Guise as a part of collaborative care agreement  Meds ordered this encounter  Medications  . amLODipine (NORVASC) 2.5 MG tablet    Sig: Take 1 tablet (2.5 mg total) by mouth daily.    Dispense:  90 tablet    Refill:  1    Order Specific Question:   Supervising Provider    Answer:   Lavera Guise [[id number]]  . atenolol (TENORMIN) 25 MG tablet    Sig: takes 1 and 1/2 tablets twice a day.    Dispense:  180 tablet    Refill:  1    Order Specific Question:   Supervising Provider    Answer:   Lavera Guise [[id number]]  . chlorhexidine (PERIDEX) 0.12 % solution    Sig: Use as directed 5 mLs in the mouth or throat 2 (two) times daily.    Dispense:  120 mL    Refill:  0    Order Specific Question:   Supervising Provider    Answer:   Lavera Guise [[id number]]  . levothyroxine (SYNTHROID) 50 MCG tablet    Sig: Take 1 tablet (50 mcg total) by mouth daily before breakfast.    Dispense:  90 tablet    Refill:  1    Order Specific Question:   Supervising Provider     Answer:   Lavera Guise [place]  . valACYclovir (VALTREX) 1000 MG tablet    Sig: Take 1 tablet (1,000 mg total) by mouth daily. Pt can 1 tablet twice a day if flare up for 7 days.    Dispense:  90 tablet    Refill:  1    Order Specific Question:   Supervising Provider    Answer:   Lavera Guise [[id number]]    Time spent:  52 Minutes    Dr Lavera Guise Internal medicine

## 2019-10-25 DIAGNOSIS — J3081 Allergic rhinitis due to animal (cat) (dog) hair and dander: Secondary | ICD-10-CM | POA: Diagnosis not present

## 2019-10-25 DIAGNOSIS — J3089 Other allergic rhinitis: Secondary | ICD-10-CM | POA: Diagnosis not present

## 2019-10-25 DIAGNOSIS — J301 Allergic rhinitis due to pollen: Secondary | ICD-10-CM | POA: Diagnosis not present

## 2019-10-27 DIAGNOSIS — Z0001 Encounter for general adult medical examination with abnormal findings: Secondary | ICD-10-CM | POA: Insufficient documentation

## 2019-10-27 DIAGNOSIS — K121 Other forms of stomatitis: Secondary | ICD-10-CM | POA: Insufficient documentation

## 2019-10-27 DIAGNOSIS — B009 Herpesviral infection, unspecified: Secondary | ICD-10-CM | POA: Insufficient documentation

## 2019-11-03 DIAGNOSIS — J3089 Other allergic rhinitis: Secondary | ICD-10-CM | POA: Diagnosis not present

## 2019-11-03 DIAGNOSIS — J301 Allergic rhinitis due to pollen: Secondary | ICD-10-CM | POA: Diagnosis not present

## 2019-11-03 DIAGNOSIS — J3081 Allergic rhinitis due to animal (cat) (dog) hair and dander: Secondary | ICD-10-CM | POA: Diagnosis not present

## 2019-11-10 ENCOUNTER — Other Ambulatory Visit: Payer: Self-pay | Admitting: Nurse Practitioner

## 2019-11-10 DIAGNOSIS — Z0001 Encounter for general adult medical examination with abnormal findings: Secondary | ICD-10-CM | POA: Diagnosis not present

## 2019-11-10 DIAGNOSIS — J3081 Allergic rhinitis due to animal (cat) (dog) hair and dander: Secondary | ICD-10-CM | POA: Diagnosis not present

## 2019-11-10 DIAGNOSIS — E559 Vitamin D deficiency, unspecified: Secondary | ICD-10-CM | POA: Diagnosis not present

## 2019-11-10 DIAGNOSIS — I10 Essential (primary) hypertension: Secondary | ICD-10-CM | POA: Diagnosis not present

## 2019-11-10 DIAGNOSIS — J3089 Other allergic rhinitis: Secondary | ICD-10-CM | POA: Diagnosis not present

## 2019-11-10 DIAGNOSIS — Z1159 Encounter for screening for other viral diseases: Secondary | ICD-10-CM | POA: Diagnosis not present

## 2019-11-10 DIAGNOSIS — E782 Mixed hyperlipidemia: Secondary | ICD-10-CM | POA: Diagnosis not present

## 2019-11-10 DIAGNOSIS — J301 Allergic rhinitis due to pollen: Secondary | ICD-10-CM | POA: Diagnosis not present

## 2019-11-10 DIAGNOSIS — R7301 Impaired fasting glucose: Secondary | ICD-10-CM | POA: Diagnosis not present

## 2019-11-11 LAB — COMPREHENSIVE METABOLIC PANEL
ALT: 10 IU/L (ref 0–32)
AST: 19 IU/L (ref 0–40)
Albumin/Globulin Ratio: 1.1 — ABNORMAL LOW (ref 1.2–2.2)
Albumin: 4.3 g/dL (ref 3.7–4.7)
Alkaline Phosphatase: 131 IU/L — ABNORMAL HIGH (ref 39–117)
BUN/Creatinine Ratio: 20 (ref 12–28)
BUN: 18 mg/dL (ref 8–27)
Bilirubin Total: 0.3 mg/dL (ref 0.0–1.2)
CO2: 24 mmol/L (ref 20–29)
Calcium: 9.4 mg/dL (ref 8.7–10.3)
Chloride: 103 mmol/L (ref 96–106)
Creatinine, Ser: 0.89 mg/dL (ref 0.57–1.00)
GFR calc Af Amer: 75 mL/min/{1.73_m2} (ref >59)
GFR calc non Af Amer: 65 mL/min/{1.73_m2} (ref >59)
Globulin, Total: 3.8 g/dL (ref 1.5–4.5)
Glucose: 86 mg/dL (ref 65–99)
Potassium: 3.7 mmol/L (ref 3.5–5.2)
Sodium: 142 mmol/L (ref 134–144)
Total Protein: 8.1 g/dL (ref 6.0–8.5)

## 2019-11-11 LAB — CBC
Hematocrit: 41.8 % (ref 34.0–46.6)
Hemoglobin: 14.4 g/dL (ref 11.1–15.9)
MCH: 30 pg (ref 26.6–33.0)
MCHC: 34.4 g/dL (ref 31.5–35.7)
MCV: 87 fL (ref 79–97)
Platelets: 232 10*3/uL (ref 150–450)
RBC: 4.8 x10E6/uL (ref 3.77–5.28)
RDW: 13.2 % (ref 11.7–15.4)
WBC: 5.5 10*3/uL (ref 3.4–10.8)

## 2019-11-11 LAB — LIPID PANEL W/O CHOL/HDL RATIO
Cholesterol, Total: 194 mg/dL (ref 100–199)
HDL: 39 mg/dL — ABNORMAL LOW (ref >39)
LDL Chol Calc (NIH): 129 mg/dL — ABNORMAL HIGH (ref 0–99)
Triglycerides: 147 mg/dL (ref 0–149)
VLDL Cholesterol Cal: 26 mg/dL (ref 5–40)

## 2019-11-11 LAB — T4, FREE: Free T4: 1.02 ng/dL (ref 0.82–1.77)

## 2019-11-11 LAB — TSH: TSH: 2.22 u[IU]/mL (ref 0.450–4.500)

## 2019-11-11 LAB — HGB A1C W/O EAG: Hgb A1c MFr Bld: 5.7 % — ABNORMAL HIGH (ref 4.8–5.6)

## 2019-11-11 LAB — VITAMIN D 25 HYDROXY (VIT D DEFICIENCY, FRACTURES): Vit D, 25-Hydroxy: 41 ng/mL (ref 30.0–100.0)

## 2019-11-14 DIAGNOSIS — G4733 Obstructive sleep apnea (adult) (pediatric): Secondary | ICD-10-CM | POA: Diagnosis not present

## 2019-11-14 NOTE — Progress Notes (Signed)
Good labs. Discuss with patient at visit 11/28/2019

## 2019-11-17 DIAGNOSIS — J301 Allergic rhinitis due to pollen: Secondary | ICD-10-CM | POA: Diagnosis not present

## 2019-11-17 DIAGNOSIS — J3081 Allergic rhinitis due to animal (cat) (dog) hair and dander: Secondary | ICD-10-CM | POA: Diagnosis not present

## 2019-11-17 DIAGNOSIS — J3089 Other allergic rhinitis: Secondary | ICD-10-CM | POA: Diagnosis not present

## 2019-11-22 DIAGNOSIS — J301 Allergic rhinitis due to pollen: Secondary | ICD-10-CM | POA: Diagnosis not present

## 2019-11-22 DIAGNOSIS — J3081 Allergic rhinitis due to animal (cat) (dog) hair and dander: Secondary | ICD-10-CM | POA: Diagnosis not present

## 2019-11-22 DIAGNOSIS — J3089 Other allergic rhinitis: Secondary | ICD-10-CM | POA: Diagnosis not present

## 2019-11-28 ENCOUNTER — Encounter: Payer: Self-pay | Admitting: Nurse Practitioner

## 2019-11-28 ENCOUNTER — Other Ambulatory Visit: Payer: Self-pay

## 2019-11-28 ENCOUNTER — Ambulatory Visit (INDEPENDENT_AMBULATORY_CARE_PROVIDER_SITE_OTHER): Payer: Medicare Other | Admitting: Nurse Practitioner

## 2019-11-28 VITALS — BP 161/83 | HR 72 | Temp 98.0°F | Resp 16 | Ht <= 58 in | Wt 188.8 lb

## 2019-11-28 DIAGNOSIS — K439 Ventral hernia without obstruction or gangrene: Secondary | ICD-10-CM

## 2019-11-28 DIAGNOSIS — E039 Hypothyroidism, unspecified: Secondary | ICD-10-CM

## 2019-11-28 DIAGNOSIS — I10 Essential (primary) hypertension: Secondary | ICD-10-CM | POA: Diagnosis not present

## 2019-11-28 DIAGNOSIS — Z0001 Encounter for general adult medical examination with abnormal findings: Secondary | ICD-10-CM | POA: Diagnosis not present

## 2019-11-28 DIAGNOSIS — R300 Dysuria: Secondary | ICD-10-CM | POA: Diagnosis not present

## 2019-11-28 MED ORDER — AMLODIPINE BESYLATE 2.5 MG PO TABS: 5.0000 mg | ORAL_TABLET | Freq: Every day | ORAL | 1 refills | Status: DC

## 2019-11-28 NOTE — Progress Notes (Signed)
[facility] [place], [place] [address]  Internal MEDICINE  Office Visit Note  Patient Name: Claudia Martin  [id number]  [id number]  Date of Service: 11/28/2019    Pt is here for routine health maintenance examination  Chief Complaint  Patient presents with  . Medicare Wellness  . Hypertension  . Hypothyroidism  . Back Pain    due to knee pain  . Hernia    is painful occassionally      The patient is here for health maintenance exam. Her blood pressure is mildly elevated upon arrival. Generally elevated when she first arrives at the office. Will come down with second check. Has had increased back pain. Does also have bilateral knee pain. Has seen orthopedic provider. He would like for her to lose about 15 pounds prior to discussing surgery.  She has large ventral hernia. She had CT scan done in 2013 which showed ventral hernia 7.7cm in diameter containing loops of bowel. She did have surgically implanted mesh to contain hernia. She states that over the past year, hernia has grown. Every so often, this gets tender, however, the pain does resolve on it's own.  Labs were done prior to the visit. She has mild elevation of LDL with normal total cholesterol. Her HgbA1c was 5.7.    Current Medication: Outpatient Encounter Medications as of 11/28/2019  Medication Sig Note  . acyclovir ointment (ZOVIRAX) 5 % APPLY TO AFFECTED AREAS 6  TIMES DAILY FOR 7 DAYS   . amLODipine (NORVASC) 2.5 MG tablet Take 2 tablets (5 mg total) by mouth daily.   Marland Kitchen atenolol (TENORMIN) 25 MG tablet takes 1 and 1/2 tablets twice a day.   . Blood Glucose Monitoring Suppl (ACCU-CHEK AVIVA PLUS) w/Device KIT Use as directed   . cetirizine (ZYRTEC) 10 MG tablet Take 10 mg by mouth daily.   . chlorhexidine (PERIDEX) 0.12 % solution Use as directed 5 mLs in the mouth or throat 2 (two) times daily.   Marland Kitchen conjugated estrogens (PREMARIN) vaginal cream Use 1 applicatorful twice weekly.    Marland Kitchen EPINEPHrine (EPIPEN 2-PAK) 0.3 mg/0.3 mL IJ SOAJ injection as needed. 07/16/2015: Received from: [place]  . ergocalciferol (DRISDOL) 1.25 MG (50000 UT) capsule Take 1 capsule (50,000 Units total) by mouth once a week.   . fluconazole (DIFLUCAN) 150 MG tablet take 1 tablet by mouth once daily for 3 days 07/16/2015: Received from: External Pharmacy  . ketoconazole (NIZORAL) 2 % cream Apply 1 application topically daily.   Marland Kitchen levothyroxine (SYNTHROID) 50 MCG tablet Take 1 tablet (50 mcg total) by mouth daily before breakfast.   . medroxyPROGESTERone (PROVERA) 2.5 MG tablet once daily. 07/16/2015: Received from: [place]  . metroNIDAZOLE (METROGEL) 0.75 % vaginal gel  07/16/2015: Received from: [facility]  . montelukast (SINGULAIR) 10 MG tablet Take by mouth.   . naproxen sodium (ALEVE) 220 MG tablet Take by mouth. 07/16/2015: Received from: [facility]  . nystatin cream (MYCOSTATIN) Apply 1 application topically 2 (two) times daily.   Marland Kitchen olopatadine (PATANOL) 0.1 % ophthalmic solution instill 1 drop into both eyes twice a day for ITCHY EYES 07/16/2015: Received from: External Pharmacy  . Olopatadine HCl (PATADAY) 0.2 % SOLN Place 1 drop into both eyes 2 (two) times daily.   . valACYclovir (VALTREX) 1000 MG tablet Take 1 tablet (1,000 mg total) by mouth daily. Pt can 1 tablet twice a day if flare up for 7 days.   . [  DISCONTINUED] amLODipine (NORVASC) 2.5 MG tablet Take 1 tablet (2.5 mg total) by mouth daily.   . [DISCONTINUED] nitrofurantoin, macrocrystal-monohydrate, (MACROBID) 100 MG capsule Take 1 capsule (100 mg total) by mouth 2 (two) times daily. (Patient not taking: Reported on 11/28/2019)    No facility-administered encounter medications on file as of 11/28/2019.    Surgical History: Past Surgical History:  Procedure Laterality Date  . BREAST BIOPSY    . COLON SURGERY    . COLONOSCOPY WITH PROPOFOL N/A  03/15/2018   Procedure: COLONOSCOPY WITH PROPOFOL;  Surgeon: Lin Landsman, MD;  Location: [facility] ENDOSCOPY;  Service: Gastroenterology;  Laterality: N/A;  . diverticulitis    . ECTOPIC PREGNANCY SURGERY    . fx thumb    . HERNIA REPAIR      Medical History: Past Medical History:  Diagnosis Date  . Abdominal pain   . Allergic rhinitis   . Candidiasis of vulva and vagina   . Cellulitis and abscess   . Diverticulosis   . Dysuria   . Eyelid inflammation   . Hematuria syndrome   . Hemorrhoids   . Herpes genitalis   . Herpes simplex   . HTN (hypertension)   . Hydronephrosis   . Hypothyroidism   . Incomplete bladder emptying   . Internal hordeolum   . Knee pain   . Otalgia   . Primary ovarian failure   . Sciatica   . Shortness of breath   . Urethral diverticulum   . UTI (lower urinary tract infection)   . Vaginal atrophy   . Vaginitis     Family History: Family History  Problem Relation Age of Onset  . Tuberculosis Mother   . Nephrolithiasis Brother       Review of Systems  Constitutional: Negative for activity change, chills, fatigue and unexpected weight change.  HENT: Negative for congestion, postnasal drip, rhinorrhea, sneezing, sore throat and voice change.   Respiratory: Negative for cough, chest tightness, shortness of breath and wheezing.   Cardiovascular: Negative for chest pain and palpitations.  Gastrointestinal: Positive for constipation. Negative for abdominal pain, diarrhea, nausea and vomiting.       Ventral hernia   Endocrine: Negative for cold intolerance, heat intolerance, polydipsia and polyuria.       Well controlled hypothyroid.  Genitourinary: Negative for dysuria, frequency, hematuria and urgency.  Musculoskeletal: Positive for arthralgias and back pain. Negative for joint swelling and neck pain.       Bilateral knee pain  Skin: Negative for rash.  Allergic/Immunologic: Positive for environmental allergies.  Neurological: Negative for  dizziness, tremors, numbness and headaches.  Hematological: Negative for adenopathy. Does not bruise/bleed easily.  Psychiatric/Behavioral: Negative for behavioral problems (Depression), sleep disturbance and suicidal ideas. The patient is not nervous/anxious.      Today's Vitals   11/28/19 1454  BP: (!) 161/83  Pulse: 72  Resp: 16  Temp: 98 F (36.7 C)  SpO2: 97%  Weight: 188 lb 12.8 oz (85.6 kg)  Height: 4' 7"  (1.397 m)   Body mass index is 43.88 kg/m.  Physical Exam Vitals and nursing note reviewed.  Constitutional:      General: She is not in acute distress.    Appearance: Normal appearance. She is well-developed. She is not diaphoretic.  HENT:     Head: Normocephalic and atraumatic.     Nose: Nose normal.     Mouth/Throat:     Pharynx: No oropharyngeal exudate.  Eyes:     Pupils: Pupils are equal, round,  and reactive to light.  Neck:     Thyroid: No thyromegaly.     Vascular: No carotid bruit or JVD.     Trachea: No tracheal deviation.  Cardiovascular:     Rate and Rhythm: Normal rate and regular rhythm.     Pulses: Normal pulses.     Heart sounds: Normal heart sounds. No murmur. No friction rub. No gallop.   Pulmonary:     Effort: Pulmonary effort is normal. No respiratory distress.     Breath sounds: Normal breath sounds. No wheezing or rales.  Chest:     Chest wall: No tenderness.  Abdominal:     General: Bowel sounds are normal.     Palpations: Abdomen is soft.     Hernia: A hernia is present.     Comments: Large ventral hernia present. Mildly tender with palpation.   Musculoskeletal:        General: Normal range of motion.     Cervical back: Normal range of motion and neck supple.  Lymphadenopathy:     Cervical: No cervical adenopathy.  Skin:    General: Skin is warm and dry.  Neurological:     Mental Status: She is alert and oriented to person, place, and time.     Cranial Nerves: No cranial nerve deficit.  Psychiatric:        Behavior: Behavior  normal.        Thought Content: Thought content normal.        Judgment: Judgment normal.    Depression screen [facility] 2/9 11/28/2019 10/24/2019 05/30/2019 12/20/2018 06/21/2018  Decreased Interest 0 0 0 0 0  Down, Depressed, Hopeless 0 0 0 0 0  PHQ - 2 Score 0 0 0 0 0    Functional Status Survey: Is the patient deaf or have difficulty hearing?: No Does the patient have difficulty seeing, even when wearing glasses/contacts?: Yes Does the patient have difficulty concentrating, remembering, or making decisions?: No Does the patient have difficulty walking or climbing stairs?: Yes Does the patient have difficulty dressing or bathing?: No Does the patient have difficulty doing errands alone such as visiting a doctor's office or shopping?: No  MMSE - [place] Exam 11/28/2019 06/21/2018  Orientation to time 5 5  Orientation to Place 5 5  Registration 3 3  Attention/ Calculation 5 5  Recall 3 3  Language- name 2 objects 2 2  Language- repeat 1 1  Language- follow 3 step command 3 3  Language- read & follow direction 1 1  Write a sentence 1 0  Copy design 1 1  Total score 30 29    Fall Risk  11/28/2019 10/24/2019 05/30/2019 12/20/2018 06/21/2018  Falls in the past year? 0 0 0 0 No  Number falls in past yr: - - 0 - -  Injury with Fall? - - 0 - -      LABS: Recent Results (from the past 2160 hour(s))  Comprehensive metabolic panel     Status: Abnormal   Collection Time: 11/10/19 11:57 AM  Result Value Ref Range   Glucose 86 65 - 99 mg/dL   BUN 18 8 - 27 mg/dL   Creatinine, Ser 0.89 0.57 - 1.00 mg/dL   GFR calc non Af Amer 65 >59 mL/min/1.73   GFR calc Af Amer 75 >59 mL/min/1.73   BUN/Creatinine Ratio 20 12 - 28   Sodium 142 134 - 144 mmol/L   Potassium 3.7 3.5 - 5.2 mmol/L   Chloride 103 96 -  106 mmol/L   CO2 24 20 - 29 mmol/L   Calcium 9.4 8.7 - 10.3 mg/dL   Total Protein 8.1 6.0 - 8.5 g/dL   Albumin 4.3 3.7 - 4.7 g/dL   Globulin, Total 3.8 1.5 - 4.5 g/dL    Albumin/Globulin Ratio 1.1 (L) 1.2 - 2.2   Bilirubin Total 0.3 0.0 - 1.2 mg/dL   Alkaline Phosphatase 131 (H) 39 - 117 IU/L   AST 19 0 - 40 IU/L   ALT 10 0 - 32 IU/L  CBC     Status: None   Collection Time: 11/10/19 11:57 AM  Result Value Ref Range   WBC 5.5 3.4 - 10.8 x10E3/uL   RBC 4.80 3.77 - 5.28 x10E6/uL   Hemoglobin 14.4 11.1 - 15.9 g/dL   Hematocrit 41.8 34.0 - 46.6 %   MCV 87 79 - 97 fL   MCH 30.0 26.6 - 33.0 pg   MCHC 34.4 31.5 - 35.7 g/dL   RDW 13.2 11.7 - 15.4 %   Platelets 232 150 - 450 x10E3/uL  Lipid Panel w/o Chol/HDL Ratio     Status: Abnormal   Collection Time: 11/10/19 11:57 AM  Result Value Ref Range   Cholesterol, Total 194 100 - 199 mg/dL   Triglycerides 147 0 - 149 mg/dL   HDL 39 (L) >39 mg/dL   VLDL Cholesterol Cal 26 5 - 40 mg/dL   LDL Chol Calc (NIH) 129 (H) 0 - 99 mg/dL  Hgb A1c w/o eAG     Status: Abnormal   Collection Time: 11/10/19 11:57 AM  Result Value Ref Range   Hgb A1c MFr Bld 5.7 (H) 4.8 - 5.6 %    Comment:          Prediabetes: 5.7 - 6.4          Diabetes: >6.4          Glycemic control for adults with diabetes: <7.0   T4, free     Status: None   Collection Time: 11/10/19 11:57 AM  Result Value Ref Range   Free T4 1.02 0.82 - 1.77 ng/dL  TSH     Status: None   Collection Time: 11/10/19 11:57 AM  Result Value Ref Range   TSH 2.220 0.450 - 4.500 uIU/mL  VITAMIN D 25 Hydroxy (Vit-D Deficiency, Fractures)     Status: None   Collection Time: 11/10/19 11:57 AM  Result Value Ref Range   Vit D, 25-Hydroxy 41.0 30.0 - 100.0 ng/mL    Comment: Vitamin D deficiency has been defined by the [place] and an Endocrine Society practice guideline as a level of serum 25-OH vitamin D less than 20 ng/mL (1,2). The Endocrine Society went on to further define vitamin D insufficiency as a level between 21 and 29 ng/mL (2). 1. IOM (Institute of Medicine). 2010. Dietary reference    intakes for calcium and D. [place]: The    [employer]. 2. Holick MF, Binkley Bradshaw, Bischoff-Ferrari HA, et al.    Evaluation, treatment, and prevention of vitamin D    deficiency: an Endocrine Society clinical practice    guideline. JCEM. 2011 Jul; 96(7):1911-30.      Assessment/Plan: 1. Encounter for general adult medical examination with abnormal findings Annual health maintenance exam today.   2. Essential hypertension Increase amlodipine to 55m daily. Continue atenolol as prescribed  - amLODipine (NORVASC) 2.5 MG tablet; Take 2 tablets (5 mg total) by mouth daily.  Dispense: 180 tablet; Refill: 1  3.  Ventral hernia without obstruction or gangrene Will monitor closely. May require new CT and/or referral to surgery for further evaluation and treatment.   4. Acquired hypothyroidism Thyroid panel stable. Continue levothyroxine as prescribed   5. Dysuria - UA/M w/rflx Culture, Routine  General Counseling: aseret hoffman understanding of the findings of todays visit and agrees with plan of treatment. I have discussed any further diagnostic evaluation that may be needed or ordered today. We also reviewed her medications today. she has been encouraged to call the office with any questions or concerns that should arise related to todays visit.    Counseling:  Hypertension Counseling:   The following hypertensive lifestyle modification were recommended and discussed:  1. Limiting alcohol intake to less than 1 oz/day of ethanol:(24 oz of beer or 8 oz of wine or 2 oz of 100-proof whiskey). 2. Take baby ASA 81 mg daily. 3. Importance of regular aerobic exercise and losing weight. 4. Reduce dietary saturated fat and cholesterol intake for overall cardiovascular health. 5. Maintaining adequate dietary potassium, calcium, and magnesium intake. 6. Regular monitoring of the blood pressure. 7. Reduce sodium intake to less than 100 mmol/day (less than 2.3 gm of sodium or less than 6 gm of sodium choride)   This patient was seen  by [place] with Dr Lavera Guise as a part of collaborative care agreement  Orders Placed This Encounter  Procedures  . UA/M w/rflx Culture, Routine    Meds ordered this encounter  Medications  . amLODipine (NORVASC) 2.5 MG tablet    Sig: Take 2 tablets (5 mg total) by mouth daily.    Dispense:  180 tablet    Refill:  1    Please note increased dose. Patient does not need new prescription yet.    Order Specific Question:   Supervising Provider    Answer:   Lavera Guise [[id number]]    Total time spent: 5 Minutes  Time spent includes review of chart, medications, test results, and follow up plan with the patient.     Lavera Guise, MD  Internal Medicine

## 2019-11-29 DIAGNOSIS — J301 Allergic rhinitis due to pollen: Secondary | ICD-10-CM | POA: Diagnosis not present

## 2019-11-29 DIAGNOSIS — J3089 Other allergic rhinitis: Secondary | ICD-10-CM | POA: Diagnosis not present

## 2019-11-29 DIAGNOSIS — J3081 Allergic rhinitis due to animal (cat) (dog) hair and dander: Secondary | ICD-10-CM | POA: Diagnosis not present

## 2019-11-29 NOTE — Progress Notes (Signed)
Waiting on culture and sensitivity results.

## 2019-12-01 LAB — UA/M W/RFLX CULTURE, ROUTINE
Bilirubin, UA: NEGATIVE
Glucose, UA: NEGATIVE
Ketones, UA: NEGATIVE
Nitrite, UA: NEGATIVE
RBC, UA: NEGATIVE
Specific Gravity, UA: 1.022 (ref 1.005–1.030)
Urobilinogen, Ur: 0.2 mg/dL (ref 0.2–1.0)
pH, UA: 5.5 (ref 5.0–7.5)

## 2019-12-01 LAB — MICROSCOPIC EXAMINATION
Bacteria, UA: NONE SEEN
Casts: NONE SEEN /lpf
RBC, Urine: NONE SEEN /hpf (ref 0–2)
WBC, UA: >30 /hpf — AB (ref 0–5)

## 2019-12-01 LAB — URINE CULTURE, REFLEX

## 2019-12-02 ENCOUNTER — Telehealth: Payer: Self-pay

## 2019-12-02 ENCOUNTER — Other Ambulatory Visit: Payer: Self-pay | Admitting: Nurse Practitioner

## 2019-12-02 DIAGNOSIS — N390 Urinary tract infection, site not specified: Secondary | ICD-10-CM

## 2019-12-02 MED ORDER — AMOXICILLIN-POT CLAVULANATE 875-125 MG PO TABS: 1.0000 | ORAL_TABLET | Freq: Two times a day (BID) | ORAL | 0 refills | Status: DC

## 2019-12-02 NOTE — Telephone Encounter (Signed)
LMOM to call us back.

## 2019-12-02 NOTE — Telephone Encounter (Signed)
Pt advised urine showed infection we send antibiotic to [facility]

## 2019-12-02 NOTE — Progress Notes (Signed)
uti present during health maintenance exam. Added augmentin 875mg  twice daily for 10 days. Sent to [facility].

## 2019-12-02 NOTE — Telephone Encounter (Signed)
-----   Message from Ronnell Freshwater, NP sent at 12/02/2019  8:48 AM EST ----- Please let the patient know that uti present during health maintenance exam. Added augmentin 875mg  twice daily for 10 days. Sent to [facility]. Thanks.

## 2019-12-02 NOTE — Progress Notes (Signed)
Please let the patient know that uti present during health maintenance exam. Added augmentin 875mg  twice daily for 10 days. Sent to [facility]. Thanks.

## 2019-12-06 ENCOUNTER — Telehealth: Payer: Self-pay

## 2019-12-06 NOTE — Telephone Encounter (Signed)
Confirmed appointment on 12/08/2019 and screened for covid. klh 

## 2019-12-07 ENCOUNTER — Telehealth: Payer: Self-pay

## 2019-12-07 ENCOUNTER — Other Ambulatory Visit: Payer: Self-pay | Admitting: Nurse Practitioner

## 2019-12-07 DIAGNOSIS — B372 Candidiasis of skin and nail: Secondary | ICD-10-CM

## 2019-12-07 MED ORDER — FLUCONAZOLE 150 MG PO TABS: ORAL_TABLET | ORAL | 0 refills | Status: DC

## 2019-12-07 NOTE — Telephone Encounter (Signed)
Pt advised as per heather take benadryl and not driving after taking med and also we send diflucan and she is better than she can go for covid vaccine otherwise she need to reschedule

## 2019-12-07 NOTE — Progress Notes (Signed)
Sent diflucan 150mg  to [facility]. Take once. Reeat dose on three days if symptoms are persistent.

## 2019-12-07 NOTE — Telephone Encounter (Signed)
Sent diflucan 150mg  to [facility]. Take once. Reeat dose on three days if symptoms are persistent.

## 2019-12-08 ENCOUNTER — Encounter: Payer: Self-pay | Admitting: Internal Medicine

## 2019-12-08 ENCOUNTER — Ambulatory Visit (INDEPENDENT_AMBULATORY_CARE_PROVIDER_SITE_OTHER): Payer: Medicare Other | Admitting: Internal Medicine

## 2019-12-08 VITALS — Resp 16 | Ht <= 58 in | Wt 188.0 lb

## 2019-12-08 DIAGNOSIS — Z9989 Dependence on other enabling machines and devices: Secondary | ICD-10-CM

## 2019-12-08 DIAGNOSIS — I10 Essential (primary) hypertension: Secondary | ICD-10-CM | POA: Diagnosis not present

## 2019-12-08 DIAGNOSIS — J3089 Other allergic rhinitis: Secondary | ICD-10-CM | POA: Diagnosis not present

## 2019-12-08 DIAGNOSIS — G4733 Obstructive sleep apnea (adult) (pediatric): Secondary | ICD-10-CM | POA: Diagnosis not present

## 2019-12-08 DIAGNOSIS — J301 Allergic rhinitis due to pollen: Secondary | ICD-10-CM | POA: Diagnosis not present

## 2019-12-08 DIAGNOSIS — J3081 Allergic rhinitis due to animal (cat) (dog) hair and dander: Secondary | ICD-10-CM | POA: Diagnosis not present

## 2019-12-08 NOTE — Progress Notes (Signed)
[facility] [place], [place] [address]  Internal MEDICINE  Telephone Visit  Patient Name: Claudia Martin  [id number]  [id number]  Date of Service: 12/08/2019  I connected with the patient at 1110 by telephone and verified the patients identity using two identifiers.   I discussed the limitations, risks, security and privacy concerns of performing an evaluation and management service by telephone and the availability of in person appointments. I also discussed with the patient that there may be a patient responsible charge related to the service.  The patient expressed understanding and agrees to proceed.    Chief Complaint  Patient presents with  . Follow-up    HPI  Pt seen via telephone. She has a history osa and HTN.  Pt reports good compliance with CPAP therapy. Cleaning machine by hand, and changing filters and tubing as directed. Denies headaches, sinus issues, palpitations, or hemoptysis.  She denies any complaints at this time, and generally wears her machine for 8 hours nightly.     Current Medication: Outpatient Encounter Medications as of 12/08/2019  Medication Sig Note  . acyclovir ointment (ZOVIRAX) 5 % APPLY TO AFFECTED AREAS 6  TIMES DAILY FOR 7 DAYS   . amLODipine (NORVASC) 2.5 MG tablet Take 2 tablets (5 mg total) by mouth daily.   Marland Kitchen amoxicillin-clavulanate (AUGMENTIN) 875-125 MG tablet Take 1 tablet by mouth 2 (two) times daily.   Marland Kitchen atenolol (TENORMIN) 25 MG tablet takes 1 and 1/2 tablets twice a day.   . Blood Glucose Monitoring Suppl (ACCU-CHEK AVIVA PLUS) w/Device KIT Use as directed   . cetirizine (ZYRTEC) 10 MG tablet Take 10 mg by mouth daily.   . chlorhexidine (PERIDEX) 0.12 % solution Use as directed 5 mLs in the mouth or throat 2 (two) times daily.   Marland Kitchen conjugated estrogens (PREMARIN) vaginal cream Use 1 applicatorful twice weekly.   Marland Kitchen EPINEPHrine (EPIPEN 2-PAK) 0.3 mg/0.3 mL IJ SOAJ injection as needed. 07/16/2015: Received  from: [place]  . ergocalciferol (DRISDOL) 1.25 MG (50000 UT) capsule Take 1 capsule (50,000 Units total) by mouth once a week.   . fluconazole (DIFLUCAN) 150 MG tablet Take 1 tablet po once for fungal infection. May repeat dose in three days for persistent symptoms   . ketoconazole (NIZORAL) 2 % cream Apply 1 application topically daily.   Marland Kitchen levothyroxine (SYNTHROID) 50 MCG tablet Take 1 tablet (50 mcg total) by mouth daily before breakfast.   . medroxyPROGESTERone (PROVERA) 2.5 MG tablet once daily. 07/16/2015: Received from: [place]  . metroNIDAZOLE (METROGEL) 0.75 % vaginal gel  07/16/2015: Received from: [facility]  . montelukast (SINGULAIR) 10 MG tablet Take by mouth.   . naproxen sodium (ALEVE) 220 MG tablet Take by mouth. 07/16/2015: Received from: [facility]  . nystatin cream (MYCOSTATIN) Apply 1 application topically 2 (two) times daily.   Marland Kitchen olopatadine (PATANOL) 0.1 % ophthalmic solution instill 1 drop into both eyes twice a day for ITCHY EYES 07/16/2015: Received from: External Pharmacy  . Olopatadine HCl (PATADAY) 0.2 % SOLN Place 1 drop into both eyes 2 (two) times daily.   . valACYclovir (VALTREX) 1000 MG tablet Take 1 tablet (1,000 mg total) by mouth daily. Pt can 1 tablet twice a day if flare up for 7 days.    No facility-administered encounter medications on file as of 12/08/2019.    Surgical History: Past Surgical History:  Procedure Laterality Date  . BREAST BIOPSY    .  COLON SURGERY    . COLONOSCOPY WITH PROPOFOL N/A 03/15/2018   Procedure: COLONOSCOPY WITH PROPOFOL;  Surgeon: Lin Landsman, MD;  Location: [facility] ENDOSCOPY;  Service: Gastroenterology;  Laterality: N/A;  . diverticulitis    . ECTOPIC PREGNANCY SURGERY    . fx thumb    . HERNIA REPAIR      Medical History: Past Medical History:  Diagnosis Date  . Abdominal pain   . Allergic rhinitis   . Candidiasis of vulva and  vagina   . Cellulitis and abscess   . Diverticulosis   . Dysuria   . Eyelid inflammation   . Hematuria syndrome   . Hemorrhoids   . Herpes genitalis   . Herpes simplex   . HTN (hypertension)   . Hydronephrosis   . Hypothyroidism   . Incomplete bladder emptying   . Internal hordeolum   . Knee pain   . Otalgia   . Primary ovarian failure   . Sciatica   . Shortness of breath   . Urethral diverticulum   . UTI (lower urinary tract infection)   . Vaginal atrophy   . Vaginitis     Family History: Family History  Problem Relation Age of Onset  . Tuberculosis Mother   . Nephrolithiasis Brother     Social History   Socioeconomic History  . Marital status: Divorced    Spouse name: Not on file  . Number of children: Not on file  . Years of education: Not on file  . Highest education level: Not on file  Occupational History  . Not on file  Tobacco Use  . Smoking status: Never Smoker  . Smokeless tobacco: Never Used  Substance and Sexual Activity  . Alcohol use: No  . Drug use: Never  . Sexual activity: Not on file  Other Topics Concern  . Not on file  Social History Narrative  . Not on file   Social Determinants of Health   Financial Resource Strain:   . Difficulty of Paying Living Expenses: Not on file  Food Insecurity:   . Worried About Charity fundraiser in the Last Year: Not on file  . Ran Out of Food in the Last Year: Not on file  Transportation Needs:   . Lack of Transportation (Medical): Not on file  . Lack of Transportation (Non-Medical): Not on file  Physical Activity:   . Days of Exercise per Week: Not on file  . Minutes of Exercise per Session: Not on file  Stress:   . Feeling of Stress : Not on file  Social Connections:   . Frequency of Communication with Friends and Family: Not on file  . Frequency of Social Gatherings with Friends and Family: Not on file  . Attends Religious Services: Not on file  . Active Member of Clubs or Organizations:  Not on file  . Attends Archivist Meetings: Not on file  . Marital Status: Not on file  Intimate Partner Violence:   . Fear of Current or Ex-Partner: Not on file  . Emotionally Abused: Not on file  . Physically Abused: Not on file  . Sexually Abused: Not on file      Review of Systems  Constitutional: Negative for chills, fatigue and unexpected weight change.  HENT: Negative for congestion, rhinorrhea, sneezing and sore throat.   Eyes: Negative for photophobia, pain and redness.  Respiratory: Negative for cough, chest tightness and shortness of breath.   Cardiovascular: Negative for chest pain and palpitations.  Gastrointestinal: Negative for abdominal pain, constipation, diarrhea, nausea and vomiting.  Endocrine: Negative.   Genitourinary: Negative for dysuria and frequency.  Musculoskeletal: Negative for arthralgias, back pain, joint swelling and neck pain.  Skin: Negative for rash.  Allergic/Immunologic: Negative.   Neurological: Negative for tremors and numbness.  Hematological: Negative for adenopathy. Does not bruise/bleed easily.  Psychiatric/Behavioral: Negative for behavioral problems and sleep disturbance. The patient is not nervous/anxious.     Vital Signs: Resp 16   Ht 4' 7" (1.397 m)   Wt 188 lb (85.3 kg)   BMI 43.70 kg/m    Observation/Objective:  Well sounding, NAD noted   Assessment/Plan: 1. OSA on CPAP Continue to use cpap nightly as directed. Good control of symptoms currently.   2. Essential hypertension Controllled, continue present mgmt.  3. Morbid obesity ([place]) Obesity Counseling: Risk Assessment: An assessment of behavioral risk factors was made today and includes lack of exercise sedentary lifestyle, lack of portion control and poor dietary habits.  Risk Modification Advice: She was counseled on portion control guidelines. Restricting daily caloric intake to 1800. The detrimental long term effects of obesity on her health and  ongoing poor compliance was also discussed with the patient.    General Counseling: arena lindahl understanding of the findings of today's phone visit and agrees with plan of treatment. I have discussed any further diagnostic evaluation that may be needed or ordered today. We also reviewed her medications today. she has been encouraged to call the office with any questions or concerns that should arise related to todays visit.    No orders of the defined types were placed in this encounter.   No orders of the defined types were placed in this encounter.   Time spent: 20 Minutes    Orson Gear AGNP-C Pulmonary medicine

## 2019-12-15 DIAGNOSIS — J3089 Other allergic rhinitis: Secondary | ICD-10-CM | POA: Diagnosis not present

## 2019-12-15 DIAGNOSIS — J3081 Allergic rhinitis due to animal (cat) (dog) hair and dander: Secondary | ICD-10-CM | POA: Diagnosis not present

## 2019-12-15 DIAGNOSIS — J301 Allergic rhinitis due to pollen: Secondary | ICD-10-CM | POA: Diagnosis not present

## 2019-12-20 DIAGNOSIS — J3089 Other allergic rhinitis: Secondary | ICD-10-CM | POA: Diagnosis not present

## 2019-12-20 DIAGNOSIS — J3081 Allergic rhinitis due to animal (cat) (dog) hair and dander: Secondary | ICD-10-CM | POA: Diagnosis not present

## 2019-12-20 DIAGNOSIS — J301 Allergic rhinitis due to pollen: Secondary | ICD-10-CM | POA: Diagnosis not present

## 2019-12-22 ENCOUNTER — Telehealth: Payer: Self-pay

## 2019-12-22 NOTE — Telephone Encounter (Signed)
CONFIRMED AND SCREENED FOR 12-26-19 OV. 

## 2019-12-26 ENCOUNTER — Ambulatory Visit (INDEPENDENT_AMBULATORY_CARE_PROVIDER_SITE_OTHER): Payer: Medicare Other | Admitting: Nurse Practitioner

## 2019-12-26 ENCOUNTER — Other Ambulatory Visit: Payer: Self-pay

## 2019-12-26 ENCOUNTER — Encounter: Payer: Self-pay | Admitting: Nurse Practitioner

## 2019-12-26 VITALS — BP 132/80 | HR 73 | Temp 98.1°F | Resp 16 | Ht <= 58 in | Wt 187.8 lb

## 2019-12-26 DIAGNOSIS — N390 Urinary tract infection, site not specified: Secondary | ICD-10-CM | POA: Diagnosis not present

## 2019-12-26 DIAGNOSIS — E039 Hypothyroidism, unspecified: Secondary | ICD-10-CM | POA: Diagnosis not present

## 2019-12-26 DIAGNOSIS — I10 Essential (primary) hypertension: Secondary | ICD-10-CM | POA: Diagnosis not present

## 2019-12-26 DIAGNOSIS — B372 Candidiasis of skin and nail: Secondary | ICD-10-CM | POA: Diagnosis not present

## 2019-12-26 MED ORDER — FLUCONAZOLE 150 MG PO TABS: ORAL_TABLET | ORAL | 0 refills | Status: DC

## 2019-12-26 MED ORDER — CIPROFLOXACIN HCL 250 MG PO TABS: 250.0000 mg | ORAL_TABLET | Freq: Two times a day (BID) | ORAL | 0 refills | Status: DC

## 2019-12-26 NOTE — Progress Notes (Signed)
[facility] [place], [place] [address]  Internal MEDICINE  Office Visit Note  Patient Name: Claudia Martin  [id number]  [id number]  Date of Service: 01/04/2020  Chief Complaint  Patient presents with  . Follow-up    INCREASED AMLODIPINE     The patient is here for follow up of blood pressure. increased her amlodipine 2.69m to two tablets daily. Her blood pressure is improved from her last visit. She is reporting no negative side effects from the increased dose of blood pressure medication. She states that she was treated for uti at recent visit. Continues to have some burning with urination from time to time. She also notes some vaginal itching and irritation. Symptoms did not completely resolve after prior treatment .she is not able to provide urine sample today. She denies abdominal pain, nausea, vomiting, or diarrhea.       Current Medication: Outpatient Encounter Medications as of 12/26/2019  Medication Sig Note  . acyclovir ointment (ZOVIRAX) 5 % APPLY TO AFFECTED AREAS 6  TIMES DAILY FOR 7 DAYS   . amLODipine (NORVASC) 2.5 MG tablet Take 2 tablets (5 mg total) by mouth daily.   .Marland Kitchenatenolol (TENORMIN) 25 MG tablet takes 1 and 1/2 tablets twice a day.   . Blood Glucose Monitoring Suppl (ACCU-CHEK AVIVA PLUS) w/Device KIT Use as directed   . cetirizine (ZYRTEC) 10 MG tablet Take 10 mg by mouth daily.   . chlorhexidine (PERIDEX) 0.12 % solution Use as directed 5 mLs in the mouth or throat 2 (two) times daily.   .Marland Kitchenconjugated estrogens (PREMARIN) vaginal cream Use 1 applicatorful twice weekly.   .Marland KitchenEPINEPHrine (EPIPEN 2-PAK) 0.3 mg/0.3 mL IJ SOAJ injection as needed. 07/16/2015: [place]: [place] . ergocalciferol (DRISDOL) 1.25 MG (50000 UT) capsule Take 1 capsule (50,000 Units total) by mouth once a week.   . fluconazole (DIFLUCAN) 150 MG tablet Take 1 tablet po once for fungal infection. May repeat dose in three days for  persistent symptoms   . ketoconazole (NIZORAL) 2 % cream Apply 1 application topically daily.   .Marland Kitchenlevothyroxine (SYNTHROID) 50 MCG tablet Take 1 tablet (50 mcg total) by mouth daily before breakfast.   . medroxyPROGESTERone (PROVERA) 2.5 MG tablet once daily. 07/16/2015: Received from: [facility] . metroNIDAZOLE (METROGEL) 0.75 % vaginal gel  [facility]2015: [facility] . montelukast (SINGULAIR) 10 MG tablet Take by mouth.   . naproxen sodium (ALEVE) 220 MG tablet Take by mouth. [facility]2015: [facility] nystatin cream (MYCOSTATIN) Apply 1 application topically 2 (two) times daily.   .Marland Kitchenolopatadine (PATANOL) 0.1 % ophthalmic solution instill 1 drop into both eyes twice a day for ITCHY EYES 07/16/2015: Received from: External Pharmacy  . Olopatadine HCl (PATADAY) 0.2 % SOLN Place 1 drop into both eyes 2 (two) times daily.   . valACYclovir (VALTREX) 1000 MG tablet Take 1 tablet (1,000 mg total) by mouth daily. Pt can 1 tablet twice a day if flare up for 7 days.   . [DISCONTINUED] fluconazole (DIFLUCAN) 150 MG tablet Take 1 tablet po once for fungal infection. May repeat dose in three days for persistent symptoms   . ciprofloxacin (CIPRO) 250 MG tablet Take 1 tablet (250 mg total) by mouth 2 (two) times daily.   . [DISCONTINUED] amoxicillin-clavulanate (AUGMENTIN) 875-125 MG tablet Take 1 tablet by mouth 2 (two) times daily. (Patient not taking: Reported on 12/26/2019)  No facility-administered encounter medications on file as of 12/26/2019.    Surgical History: Past Surgical History:  Procedure Laterality Date  . BREAST BIOPSY    . COLON SURGERY    . COLONOSCOPY WITH PROPOFOL N/A 03/15/2018   Procedure: COLONOSCOPY WITH PROPOFOL;  Surgeon: Lin Landsman, MD;  Location: [facility] ENDOSCOPY;  Service: Gastroenterology;  Laterality: N/A;  . diverticulitis    . ECTOPIC PREGNANCY SURGERY    . fx thumb    . HERNIA  REPAIR      Medical History: Past Medical History:  Diagnosis Date  . Abdominal pain   . Allergic rhinitis   . Candidiasis of vulva and vagina   . Cellulitis and abscess   . Diverticulosis   . Dysuria   . Eyelid inflammation   . Hematuria syndrome   . Hemorrhoids   . Herpes genitalis   . Herpes simplex   . HTN (hypertension)   . Hydronephrosis   . Hypothyroidism   . Incomplete bladder emptying   . Internal hordeolum   . Knee pain   . Otalgia   . Primary ovarian failure   . Sciatica   . Shortness of breath   . Urethral diverticulum   . UTI (lower urinary tract infection)   . Vaginal atrophy   . Vaginitis     Family History: Family History  Problem Relation Age of Onset  . Tuberculosis Mother   . Nephrolithiasis Brother     Social History   Socioeconomic History  . Marital status: Divorced    Spouse name: Not on file  . Number of children: Not on file  . Years of education: Not on file  . Highest education level: Not on file  Occupational History  . Not on file  Tobacco Use  . Smoking status: Never Smoker  . Smokeless tobacco: Never Used  Substance and Sexual Activity  . Alcohol use: No  . Drug use: Never  . Sexual activity: Not on file  Other Topics Concern  . Not on file  Social History Narrative  . Not on file   Social Determinants of Health   Financial Resource Strain:   . Difficulty of Paying Living Expenses:   Food Insecurity:   . Worried About Charity fundraiser in the Last Year:   . Arboriculturist in the Last Year:   Transportation Needs:   . Film/video editor (Medical):   Marland Kitchen Lack of Transportation (Non-Medical):   Physical Activity:   . Days of Exercise per Week:   . Minutes of Exercise per Session:   Stress:   . Feeling of Stress :   Social Connections:   . Frequency of Communication with Friends and Family:   . Frequency of Social Gatherings with Friends and Family:   . Attends Religious Services:   . Active Member of  Clubs or Organizations:   . Attends Archivist Meetings:   Marland Kitchen Marital Status:   Intimate Partner Violence:   . Fear of Current or Ex-Partner:   . Emotionally Abused:   Marland Kitchen Physically Abused:   . Sexually Abused:       Review of Systems  Constitutional: Negative for chills, fatigue and unexpected weight change.  HENT: Negative for congestion, postnasal drip, rhinorrhea, sneezing and sore throat.   Respiratory: Negative for cough, chest tightness, shortness of breath and wheezing.   Cardiovascular: Negative for chest pain and palpitations.       Improved blood pressure.   Gastrointestinal: Negative  for abdominal pain, constipation, diarrhea, nausea and vomiting.  Endocrine: Negative for cold intolerance, heat intolerance, polydipsia and polyuria.  Genitourinary: Positive for dysuria and vaginal discharge. Negative for frequency.       Vaginal itching.   Musculoskeletal: Negative for arthralgias, back pain, joint swelling and neck pain.  Skin: Negative for rash.  Neurological: Negative for dizziness, tremors, numbness and headaches.  Hematological: Negative for adenopathy. Does not bruise/bleed easily.  Psychiatric/Behavioral: Negative for behavioral problems (Depression), sleep disturbance and suicidal ideas. The patient is not nervous/anxious.     Today's Vitals   12/26/19 1351  BP: 132/80  Pulse: 73  Resp: 16  Temp: 98.1 F (36.7 C)  SpO2: 95%  Weight: 187 lb 12.8 oz (85.2 kg)  Height: 4' 7" (1.397 m)   Body mass index is 43.65 kg/m.  Physical Exam Vitals and nursing note reviewed.  Constitutional:      General: She is not in acute distress.    Appearance: Normal appearance. She is well-developed. She is not diaphoretic.  HENT:     Head: Normocephalic and atraumatic.     Nose: Nose normal.     Mouth/Throat:     Pharynx: No oropharyngeal exudate.  Eyes:     Pupils: Pupils are equal, round, and reactive to light.  Neck:     Thyroid: No thyromegaly.      Vascular: No carotid bruit or JVD.     Trachea: No tracheal deviation.  Cardiovascular:     Rate and Rhythm: Normal rate and regular rhythm.     Pulses: Normal pulses.     Heart sounds: Normal heart sounds. No murmur. No friction rub. No gallop.   Pulmonary:     Effort: Pulmonary effort is normal. No respiratory distress.     Breath sounds: Normal breath sounds. No wheezing or rales.  Chest:     Chest wall: No tenderness.  Abdominal:     General: Bowel sounds are normal.     Palpations: Abdomen is soft.     Hernia: A hernia is present.     Comments: Large ventral hernia present. Mildly tender with palpation.   Musculoskeletal:        General: Normal range of motion.     Cervical back: Normal range of motion and neck supple.  Lymphadenopathy:     Cervical: No cervical adenopathy.  Skin:    General: Skin is warm and dry.  Neurological:     Mental Status: She is alert and oriented to person, place, and time.     Cranial Nerves: No cranial nerve deficit.  Psychiatric:        Behavior: Behavior normal.        Thought Content: Thought content normal.        Judgment: Judgment normal.    Assessment/Plan: 1. Essential hypertension Improved. continue with higher dose of amlodipine. Keep other blood pressure medications as prescribed .  2. Urinary tract infection without hematuria, site unspecified Will do round cipro 243m twice dally for seven days.  - ciprofloxacin (CIPRO) 250 MG tablet; Take 1 tablet (250 mg total) by mouth 2 (two) times daily.  Dispense: 14 tablet; Refill: 0  3. Candidiasis of skin Diflucan may be taken as needed and as prescribed  - fluconazole (DIFLUCAN) 150 MG tablet; Take 1 tablet po once for fungal infection. May repeat dose in three days for persistent symptoms  Dispense: 3 tablet; Refill: 0  4. Acquired hypothyroidism Thyroid panel stable. Continue levothyroxine as prescribed.   General  Counseling: mayline dragon understanding of the findings of  todays visit and agrees with plan of treatment. I have discussed any further diagnostic evaluation that may be needed or ordered today. We also reviewed her medications today. she has been encouraged to call the office with any questions or concerns that should arise related to todays visit.  Hypertension Counseling:   The following hypertensive lifestyle modification were recommended and discussed:  1. Limiting alcohol intake to less than 1 oz/day of ethanol:(24 oz of beer or 8 oz of wine or 2 oz of 100-proof whiskey). 2. Take baby ASA 81 mg daily. 3. Importance of regular aerobic exercise and losing weight. 4. Reduce dietary saturated fat and cholesterol intake for overall cardiovascular health. 5. Maintaining adequate dietary potassium, calcium, and magnesium intake. 6. Regular monitoring of the blood pressure. 7. Reduce sodium intake to less than 100 mmol/day (less than 2.3 gm of sodium or less than 6 gm of sodium choride)   This patient was seen by [place] with Dr Lavera Guise as a part of collaborative care agreement  Meds ordered this encounter  Medications  . fluconazole (DIFLUCAN) 150 MG tablet    Sig: Take 1 tablet po once for fungal infection. May repeat dose in three days for persistent symptoms    Dispense:  3 tablet    Refill:  0    Order Specific Question:   Supervising Provider    Answer:   Lavera Guise [place]  . ciprofloxacin (CIPRO) 250 MG tablet    Sig: Take 1 tablet (250 mg total) by mouth 2 (two) times daily.    Dispense:  14 tablet    Refill:  0    Order Specific Question:   Supervising Provider    Answer:   Lavera Guise [[id number]]    Total time spent: 30 Minutes    Time spent includes review of chart, medications, test results, and follow up plan with the patient.      Dr Lavera Guise Internal medicine

## 2019-12-29 DIAGNOSIS — J3081 Allergic rhinitis due to animal (cat) (dog) hair and dander: Secondary | ICD-10-CM | POA: Diagnosis not present

## 2019-12-29 DIAGNOSIS — J301 Allergic rhinitis due to pollen: Secondary | ICD-10-CM | POA: Diagnosis not present

## 2019-12-29 DIAGNOSIS — J3089 Other allergic rhinitis: Secondary | ICD-10-CM | POA: Diagnosis not present

## 2020-01-03 DIAGNOSIS — J3081 Allergic rhinitis due to animal (cat) (dog) hair and dander: Secondary | ICD-10-CM | POA: Diagnosis not present

## 2020-01-03 DIAGNOSIS — J3089 Other allergic rhinitis: Secondary | ICD-10-CM | POA: Diagnosis not present

## 2020-01-03 DIAGNOSIS — J301 Allergic rhinitis due to pollen: Secondary | ICD-10-CM | POA: Diagnosis not present

## 2020-01-04 DIAGNOSIS — B372 Candidiasis of skin and nail: Secondary | ICD-10-CM | POA: Insufficient documentation

## 2020-01-04 DIAGNOSIS — N390 Urinary tract infection, site not specified: Secondary | ICD-10-CM | POA: Insufficient documentation

## 2020-01-12 DIAGNOSIS — J3089 Other allergic rhinitis: Secondary | ICD-10-CM | POA: Diagnosis not present

## 2020-01-12 DIAGNOSIS — J301 Allergic rhinitis due to pollen: Secondary | ICD-10-CM | POA: Diagnosis not present

## 2020-01-12 DIAGNOSIS — J3081 Allergic rhinitis due to animal (cat) (dog) hair and dander: Secondary | ICD-10-CM | POA: Diagnosis not present

## 2020-01-18 DIAGNOSIS — J3089 Other allergic rhinitis: Secondary | ICD-10-CM | POA: Diagnosis not present

## 2020-01-18 DIAGNOSIS — J3081 Allergic rhinitis due to animal (cat) (dog) hair and dander: Secondary | ICD-10-CM | POA: Diagnosis not present

## 2020-01-19 DIAGNOSIS — J3089 Other allergic rhinitis: Secondary | ICD-10-CM | POA: Diagnosis not present

## 2020-01-19 DIAGNOSIS — J301 Allergic rhinitis due to pollen: Secondary | ICD-10-CM | POA: Diagnosis not present

## 2020-01-26 DIAGNOSIS — J3081 Allergic rhinitis due to animal (cat) (dog) hair and dander: Secondary | ICD-10-CM | POA: Diagnosis not present

## 2020-01-26 DIAGNOSIS — J301 Allergic rhinitis due to pollen: Secondary | ICD-10-CM | POA: Diagnosis not present

## 2020-01-26 DIAGNOSIS — J3089 Other allergic rhinitis: Secondary | ICD-10-CM | POA: Diagnosis not present

## 2020-02-02 DIAGNOSIS — J3081 Allergic rhinitis due to animal (cat) (dog) hair and dander: Secondary | ICD-10-CM | POA: Diagnosis not present

## 2020-02-02 DIAGNOSIS — J3089 Other allergic rhinitis: Secondary | ICD-10-CM | POA: Diagnosis not present

## 2020-02-02 DIAGNOSIS — J301 Allergic rhinitis due to pollen: Secondary | ICD-10-CM | POA: Diagnosis not present

## 2020-02-09 DIAGNOSIS — J3081 Allergic rhinitis due to animal (cat) (dog) hair and dander: Secondary | ICD-10-CM | POA: Diagnosis not present

## 2020-02-09 DIAGNOSIS — J301 Allergic rhinitis due to pollen: Secondary | ICD-10-CM | POA: Diagnosis not present

## 2020-02-09 DIAGNOSIS — J3089 Other allergic rhinitis: Secondary | ICD-10-CM | POA: Diagnosis not present

## 2020-02-16 DIAGNOSIS — J3081 Allergic rhinitis due to animal (cat) (dog) hair and dander: Secondary | ICD-10-CM | POA: Diagnosis not present

## 2020-02-16 DIAGNOSIS — J301 Allergic rhinitis due to pollen: Secondary | ICD-10-CM | POA: Diagnosis not present

## 2020-02-16 DIAGNOSIS — J3089 Other allergic rhinitis: Secondary | ICD-10-CM | POA: Diagnosis not present

## 2020-02-22 ENCOUNTER — Other Ambulatory Visit: Payer: Self-pay

## 2020-02-22 DIAGNOSIS — K121 Other forms of stomatitis: Secondary | ICD-10-CM

## 2020-02-22 MED ORDER — CHLORHEXIDINE GLUCONATE 0.12 % MT SOLN: 5.0000 mL | Freq: Two times a day (BID) | OROMUCOSAL | 0 refills | Status: DC

## 2020-02-24 DIAGNOSIS — J301 Allergic rhinitis due to pollen: Secondary | ICD-10-CM | POA: Diagnosis not present

## 2020-02-24 DIAGNOSIS — J3089 Other allergic rhinitis: Secondary | ICD-10-CM | POA: Diagnosis not present

## 2020-02-24 DIAGNOSIS — J3081 Allergic rhinitis due to animal (cat) (dog) hair and dander: Secondary | ICD-10-CM | POA: Diagnosis not present

## 2020-02-28 DIAGNOSIS — J3081 Allergic rhinitis due to animal (cat) (dog) hair and dander: Secondary | ICD-10-CM | POA: Diagnosis not present

## 2020-02-28 DIAGNOSIS — J301 Allergic rhinitis due to pollen: Secondary | ICD-10-CM | POA: Diagnosis not present

## 2020-02-28 DIAGNOSIS — J3089 Other allergic rhinitis: Secondary | ICD-10-CM | POA: Diagnosis not present

## 2020-02-29 ENCOUNTER — Ambulatory Visit (INDEPENDENT_AMBULATORY_CARE_PROVIDER_SITE_OTHER): Payer: Medicare Other

## 2020-02-29 ENCOUNTER — Other Ambulatory Visit: Payer: Self-pay

## 2020-02-29 DIAGNOSIS — G4733 Obstructive sleep apnea (adult) (pediatric): Secondary | ICD-10-CM | POA: Diagnosis not present

## 2020-02-29 NOTE — Progress Notes (Signed)
95 percentile pressure 7   95th percentile leak 0.2   apnea index 1.4 /hr  apnea-hypopnea index  2.6 /hr   total days used  >4 hr 90 days  total days used <4 hr 0 days  Total compliance 100 percent  She does great on cpap. No problems or questions at this time

## 2020-03-01 ENCOUNTER — Other Ambulatory Visit: Payer: Self-pay

## 2020-03-01 DIAGNOSIS — E559 Vitamin D deficiency, unspecified: Secondary | ICD-10-CM

## 2020-03-01 MED ORDER — ERGOCALCIFEROL 1.25 MG (50000 UT) PO CAPS: 50000.0000 [IU] | ORAL_CAPSULE | ORAL | 3 refills | Status: DC

## 2020-03-06 ENCOUNTER — Other Ambulatory Visit: Payer: Self-pay

## 2020-03-06 DIAGNOSIS — I10 Essential (primary) hypertension: Secondary | ICD-10-CM

## 2020-03-06 DIAGNOSIS — J301 Allergic rhinitis due to pollen: Secondary | ICD-10-CM | POA: Diagnosis not present

## 2020-03-06 DIAGNOSIS — B009 Herpesviral infection, unspecified: Secondary | ICD-10-CM

## 2020-03-06 DIAGNOSIS — J3081 Allergic rhinitis due to animal (cat) (dog) hair and dander: Secondary | ICD-10-CM | POA: Diagnosis not present

## 2020-03-06 DIAGNOSIS — J3089 Other allergic rhinitis: Secondary | ICD-10-CM | POA: Diagnosis not present

## 2020-03-06 MED ORDER — ATENOLOL 25 MG PO TABS: ORAL_TABLET | ORAL | 1 refills | Status: DC

## 2020-03-06 MED ORDER — VALACYCLOVIR HCL 1 G PO TABS: 1000.0000 mg | ORAL_TABLET | Freq: Every day | ORAL | 1 refills | Status: DC

## 2020-03-15 DIAGNOSIS — J3081 Allergic rhinitis due to animal (cat) (dog) hair and dander: Secondary | ICD-10-CM | POA: Diagnosis not present

## 2020-03-15 DIAGNOSIS — J3089 Other allergic rhinitis: Secondary | ICD-10-CM | POA: Diagnosis not present

## 2020-03-15 DIAGNOSIS — J301 Allergic rhinitis due to pollen: Secondary | ICD-10-CM | POA: Diagnosis not present

## 2020-03-23 DIAGNOSIS — J3081 Allergic rhinitis due to animal (cat) (dog) hair and dander: Secondary | ICD-10-CM | POA: Diagnosis not present

## 2020-03-23 DIAGNOSIS — J3089 Other allergic rhinitis: Secondary | ICD-10-CM | POA: Diagnosis not present

## 2020-03-23 DIAGNOSIS — J301 Allergic rhinitis due to pollen: Secondary | ICD-10-CM | POA: Diagnosis not present

## 2020-03-28 DIAGNOSIS — G4733 Obstructive sleep apnea (adult) (pediatric): Secondary | ICD-10-CM | POA: Diagnosis not present

## 2020-03-29 DIAGNOSIS — J3081 Allergic rhinitis due to animal (cat) (dog) hair and dander: Secondary | ICD-10-CM | POA: Diagnosis not present

## 2020-03-29 DIAGNOSIS — J3089 Other allergic rhinitis: Secondary | ICD-10-CM | POA: Diagnosis not present

## 2020-03-29 DIAGNOSIS — J301 Allergic rhinitis due to pollen: Secondary | ICD-10-CM | POA: Diagnosis not present

## 2020-04-05 DIAGNOSIS — J301 Allergic rhinitis due to pollen: Secondary | ICD-10-CM | POA: Diagnosis not present

## 2020-04-05 DIAGNOSIS — J3081 Allergic rhinitis due to animal (cat) (dog) hair and dander: Secondary | ICD-10-CM | POA: Diagnosis not present

## 2020-04-05 DIAGNOSIS — J3089 Other allergic rhinitis: Secondary | ICD-10-CM | POA: Diagnosis not present

## 2020-04-10 DIAGNOSIS — J3081 Allergic rhinitis due to animal (cat) (dog) hair and dander: Secondary | ICD-10-CM | POA: Diagnosis not present

## 2020-04-10 DIAGNOSIS — J3089 Other allergic rhinitis: Secondary | ICD-10-CM | POA: Diagnosis not present

## 2020-04-10 DIAGNOSIS — J301 Allergic rhinitis due to pollen: Secondary | ICD-10-CM | POA: Diagnosis not present

## 2020-04-17 DIAGNOSIS — J3081 Allergic rhinitis due to animal (cat) (dog) hair and dander: Secondary | ICD-10-CM | POA: Diagnosis not present

## 2020-04-17 DIAGNOSIS — J301 Allergic rhinitis due to pollen: Secondary | ICD-10-CM | POA: Diagnosis not present

## 2020-04-17 DIAGNOSIS — J3089 Other allergic rhinitis: Secondary | ICD-10-CM | POA: Diagnosis not present

## 2020-04-24 ENCOUNTER — Telehealth: Payer: Self-pay

## 2020-04-24 ENCOUNTER — Other Ambulatory Visit: Payer: Self-pay

## 2020-04-24 DIAGNOSIS — E039 Hypothyroidism, unspecified: Secondary | ICD-10-CM

## 2020-04-24 MED ORDER — LEVOTHYROXINE SODIUM 50 MCG PO TABS: 50.0000 ug | ORAL_TABLET | Freq: Every day | ORAL | 1 refills | Status: DC

## 2020-04-24 NOTE — Telephone Encounter (Signed)
LVM CONFIRMING PT APPT.AR °

## 2020-04-25 ENCOUNTER — Telehealth: Payer: Self-pay

## 2020-04-25 NOTE — Telephone Encounter (Signed)
Patient rescheduled appointment on 04/26/2020 to 05/03/2020. klh

## 2020-04-26 ENCOUNTER — Ambulatory Visit: Payer: Medicare Other | Admitting: Nurse Practitioner

## 2020-04-26 DIAGNOSIS — H1045 Other chronic allergic conjunctivitis: Secondary | ICD-10-CM | POA: Diagnosis not present

## 2020-04-26 DIAGNOSIS — J3081 Allergic rhinitis due to animal (cat) (dog) hair and dander: Secondary | ICD-10-CM | POA: Diagnosis not present

## 2020-04-26 DIAGNOSIS — J3089 Other allergic rhinitis: Secondary | ICD-10-CM | POA: Diagnosis not present

## 2020-04-26 DIAGNOSIS — J301 Allergic rhinitis due to pollen: Secondary | ICD-10-CM | POA: Diagnosis not present

## 2020-05-01 ENCOUNTER — Telehealth: Payer: Self-pay

## 2020-05-01 DIAGNOSIS — J3081 Allergic rhinitis due to animal (cat) (dog) hair and dander: Secondary | ICD-10-CM | POA: Diagnosis not present

## 2020-05-01 DIAGNOSIS — J301 Allergic rhinitis due to pollen: Secondary | ICD-10-CM | POA: Diagnosis not present

## 2020-05-01 DIAGNOSIS — J3089 Other allergic rhinitis: Secondary | ICD-10-CM | POA: Diagnosis not present

## 2020-05-01 NOTE — Telephone Encounter (Signed)
Lmom to confirm and screen for 05-03-20 ov. 

## 2020-05-01 NOTE — Telephone Encounter (Signed)
Confirmed and screened for 05-03-20 ov. 

## 2020-05-03 ENCOUNTER — Ambulatory Visit: Payer: Medicare Other | Admitting: Nurse Practitioner

## 2020-05-10 DIAGNOSIS — J3081 Allergic rhinitis due to animal (cat) (dog) hair and dander: Secondary | ICD-10-CM | POA: Diagnosis not present

## 2020-05-10 DIAGNOSIS — J301 Allergic rhinitis due to pollen: Secondary | ICD-10-CM | POA: Diagnosis not present

## 2020-05-10 DIAGNOSIS — J3089 Other allergic rhinitis: Secondary | ICD-10-CM | POA: Diagnosis not present

## 2020-05-11 ENCOUNTER — Telehealth: Payer: Self-pay

## 2020-05-11 NOTE — Telephone Encounter (Signed)
Lmom to confirm and screen for 05-15-20 ov. 

## 2020-05-15 ENCOUNTER — Encounter: Payer: Self-pay | Admitting: Nurse Practitioner

## 2020-05-15 ENCOUNTER — Ambulatory Visit (INDEPENDENT_AMBULATORY_CARE_PROVIDER_SITE_OTHER): Payer: Medicare Other | Admitting: Nurse Practitioner

## 2020-05-15 ENCOUNTER — Other Ambulatory Visit: Payer: Self-pay

## 2020-05-15 VITALS — BP 138/70 | HR 61 | Temp 97.5°F | Resp 16 | Ht <= 58 in | Wt 183.0 lb

## 2020-05-15 DIAGNOSIS — G4733 Obstructive sleep apnea (adult) (pediatric): Secondary | ICD-10-CM | POA: Diagnosis not present

## 2020-05-15 DIAGNOSIS — E039 Hypothyroidism, unspecified: Secondary | ICD-10-CM | POA: Diagnosis not present

## 2020-05-15 DIAGNOSIS — Z1231 Encounter for screening mammogram for malignant neoplasm of breast: Secondary | ICD-10-CM | POA: Diagnosis not present

## 2020-05-15 DIAGNOSIS — I10 Essential (primary) hypertension: Secondary | ICD-10-CM | POA: Diagnosis not present

## 2020-05-15 NOTE — Progress Notes (Signed)
[facility] [place], [place] [address]  Internal MEDICINE  Office Visit Note  Patient Name: Claudia Martin  [id number]  [id number]  Date of Service: 05/30/2020  Chief Complaint  Patient presents with  . Follow-up    4 month follow up    The patient is here for routine follow up visit. Blood pressure doing well. She states that she is feeling well and has no new concerns or complaints. She is due to have a screening mammogram.       Current Medication: Outpatient Encounter Medications as of 05/15/2020  Medication Sig Note  . acyclovir ointment (ZOVIRAX) 5 % APPLY TO AFFECTED AREAS 6  TIMES DAILY FOR 7 DAYS   . amLODipine (NORVASC) 2.5 MG tablet Take 2 tablets (5 mg total) by mouth daily.   Marland Kitchen atenolol (TENORMIN) 25 MG tablet takes 1 and 1/2 tablets twice a day.   . Blood Glucose Monitoring Suppl (ACCU-CHEK AVIVA PLUS) w/Device KIT Use as directed   . cetirizine (ZYRTEC) 10 MG tablet Take 10 mg by mouth daily.   . chlorhexidine (PERIDEX) 0.12 % solution Use as directed 5 mLs in the mouth or throat 2 (two) times daily.   . ciprofloxacin (CIPRO) 250 MG tablet Take 1 tablet (250 mg total) by mouth 2 (two) times daily.   Marland Kitchen conjugated estrogens (PREMARIN) vaginal cream Use 1 applicatorful twice weekly.   Marland Kitchen EPINEPHrine (EPIPEN 2-PAK) 0.3 mg/0.3 mL IJ SOAJ injection as needed. 07/16/2015: Received from: [place]  . ergocalciferol (DRISDOL) 1.25 MG (50000 UT) capsule Take 1 capsule (50,000 Units total) by mouth once a week.   . fluconazole (DIFLUCAN) 150 MG tablet Take 1 tablet po once for fungal infection. May repeat dose in three days for persistent symptoms   . ketoconazole (NIZORAL) 2 % cream Apply 1 application topically daily.   Marland Kitchen levothyroxine (SYNTHROID) 50 MCG tablet Take 1 tablet (50 mcg total) by mouth daily before breakfast.   . medroxyPROGESTERone (PROVERA) 2.5 MG tablet once daily. 07/16/2015: Received from: [place]  . metroNIDAZOLE (METROGEL) 0.75 % vaginal gel  07/16/2015: Received from: [facility]  . montelukast (SINGULAIR) 10 MG tablet Take by mouth.   . naproxen sodium (ALEVE) 220 MG tablet Take by mouth. 07/16/2015: Received from: [facility]  . nystatin cream (MYCOSTATIN) Apply 1 application topically 2 (two) times daily.   Marland Kitchen olopatadine (PATANOL) 0.1 % ophthalmic solution instill 1 drop into both eyes twice a day for ITCHY EYES 07/16/2015: Received from: External Pharmacy  . Olopatadine HCl (PATADAY) 0.2 % SOLN Place 1 drop into both eyes 2 (two) times daily.   . valACYclovir (VALTREX) 1000 MG tablet Take 1 tablet (1,000 mg total) by mouth daily. Pt can 1 tablet twice a day if flare up for 7 days.    No facility-administered encounter medications on file as of 05/15/2020.    Surgical History: Past Surgical History:  Procedure Laterality Date  . BREAST BIOPSY    . COLON SURGERY    . COLONOSCOPY WITH PROPOFOL N/A 03/15/2018   Procedure: COLONOSCOPY WITH PROPOFOL;  Surgeon: Lin Landsman, MD;  Location: [facility] ENDOSCOPY;  Service: Gastroenterology;  Laterality: N/A;  . diverticulitis    . ECTOPIC PREGNANCY SURGERY    . fx thumb    . HERNIA REPAIR      Medical History: Past Medical History:  Diagnosis Date  . Abdominal pain   . Allergic rhinitis   .  Candidiasis of vulva and vagina   . Cellulitis and abscess   . Diverticulosis   . Dysuria   . Eyelid inflammation   . Hematuria syndrome   . Hemorrhoids   . Herpes genitalis   . Herpes simplex   . HTN (hypertension)   . Hydronephrosis   . Hypothyroidism   . Incomplete bladder emptying   . Internal hordeolum   . Knee pain   . Otalgia   . Primary ovarian failure   . Sciatica   . Shortness of breath   . Urethral diverticulum   . UTI (lower urinary tract infection)   . Vaginal atrophy   . Vaginitis     Family History: Family History  Problem Relation Age of Onset   . Tuberculosis Mother   . Nephrolithiasis Brother     Social History   Socioeconomic History  . Marital status: Divorced    Spouse name: Not on file  . Number of children: Not on file  . Years of education: Not on file  . Highest education level: Not on file  Occupational History  . Not on file  Tobacco Use  . Smoking status: Never Smoker  . Smokeless tobacco: Never Used  Vaping Use  . Vaping Use: Never used  Substance and Sexual Activity  . Alcohol use: No  . Drug use: Never  . Sexual activity: Not on file  Other Topics Concern  . Not on file  Social History Narrative  . Not on file   Social Determinants of Health   Financial Resource Strain:   . Difficulty of Paying Living Expenses: Not on file  Food Insecurity:   . Worried About Charity fundraiser in the Last Year: Not on file  . Ran Out of Food in the Last Year: Not on file  Transportation Needs:   . Lack of Transportation (Medical): Not on file  . Lack of Transportation (Non-Medical): Not on file  Physical Activity:   . Days of Exercise per Week: Not on file  . Minutes of Exercise per Session: Not on file  Stress:   . Feeling of Stress : Not on file  Social Connections:   . Frequency of Communication with Friends and Family: Not on file  . Frequency of Social Gatherings with Friends and Family: Not on file  . Attends Religious Services: Not on file  . Active Member of Clubs or Organizations: Not on file  . Attends Archivist Meetings: Not on file  . Marital Status: Not on file  Intimate Partner Violence:   . Fear of Current or Ex-Partner: Not on file  . Emotionally Abused: Not on file  . Physically Abused: Not on file  . Sexually Abused: Not on file      Review of Systems  Constitutional: Negative for activity change, chills, fatigue and unexpected weight change.  HENT: Negative for congestion, postnasal drip, rhinorrhea, sneezing and sore throat.   Eyes: Negative for redness.   Respiratory: Negative for cough, chest tightness and shortness of breath.   Cardiovascular: Negative for chest pain and palpitations.  Gastrointestinal: Negative for abdominal pain, constipation, diarrhea, nausea and vomiting.  Genitourinary: Negative for dysuria and frequency.  Musculoskeletal: Positive for arthralgias. Negative for back pain, joint swelling and neck pain.       New development of bursitis in the right shoulder. Continues to have bilateral knee pain   Skin: Negative for rash.  Allergic/Immunologic: Positive for environmental allergies.  Neurological: Negative for dizziness, tremors, numbness  and headaches.  Hematological: Negative for adenopathy. Does not bruise/bleed easily.  Psychiatric/Behavioral: Negative for behavioral problems (Depression), sleep disturbance and suicidal ideas. The patient is not nervous/anxious.     Today's Vitals   05/15/20 1141  BP: 138/70  Pulse: 61  Resp: 16  Temp: (!) 97.5 F (36.4 C)  SpO2: 98%  Weight: 183 lb (83 kg)  Height: 4' 7"  (1.397 m)   Body mass index is 42.53 kg/m.  Physical Exam Vitals and nursing note reviewed.  Constitutional:      General: She is not in acute distress.    Appearance: Normal appearance. She is well-developed. She is obese. She is not diaphoretic.  HENT:     Head: Normocephalic and atraumatic.     Nose: Nose normal.     Mouth/Throat:     Pharynx: No oropharyngeal exudate.  Eyes:     Pupils: Pupils are equal, round, and reactive to light.  Neck:     Thyroid: No thyromegaly.     Vascular: No carotid bruit or JVD.     Trachea: No tracheal deviation.  Cardiovascular:     Rate and Rhythm: Normal rate and regular rhythm.     Pulses: Normal pulses.     Heart sounds: Normal heart sounds. No murmur heard.  No friction rub. No gallop.   Pulmonary:     Effort: Pulmonary effort is normal. No respiratory distress.     Breath sounds: Normal breath sounds. No wheezing or rales.  Chest:     Chest wall:  No tenderness.  Abdominal:     General: Bowel sounds are normal.     Palpations: Abdomen is soft.     Hernia: A hernia is present.     Comments: Large ventral hernia present. Mildly tender with palpation.   Musculoskeletal:        General: Normal range of motion.     Cervical back: Normal range of motion and neck supple.     Comments: Bilateral knee pain due to moderate to severe osteoarthritis. Using a cane to help with mobility.   Lymphadenopathy:     Cervical: No cervical adenopathy.  Skin:    General: Skin is warm and dry.  Neurological:     Mental Status: She is alert and oriented to person, place, and time.     Cranial Nerves: No cranial nerve deficit.  Psychiatric:        Behavior: Behavior normal.        Thought Content: Thought content normal.        Judgment: Judgment normal.   Assessment/Plan: 1. Essential hypertension Stable. Continue bp medication as prescribed   2. Acquired hypothyroidism Thyroid panel stable. Continue levothyroxine as prescribed   3. Encounter for screening mammogram for malignant neoplasm of breast - MM DIGITAL SCREENING BILATERAL; Future  4. Obstructive sleep apnea (adult) (pediatric) Continue regular visits with Dr. Devona Konig for CPAP management.   General Counseling: maribella kuna understanding of the findings of todays visit and agrees with plan of treatment. I have discussed any further diagnostic evaluation that may be needed or ordered today. We also reviewed her medications today. she has been encouraged to call the office with any questions or concerns that should arise related to todays visit.  Hypertension Counseling:   The following hypertensive lifestyle modification were recommended and discussed:  1. Limiting alcohol intake to less than 1 oz/day of ethanol:(24 oz of beer or 8 oz of wine or 2 oz of 100-proof whiskey). 2. Take baby  ASA 81 mg daily. 3. Importance of regular aerobic exercise and losing weight. 4. Reduce  dietary saturated fat and cholesterol intake for overall cardiovascular health. 5. Maintaining adequate dietary potassium, calcium, and magnesium intake. 6. Regular monitoring of the blood pressure. 7. Reduce sodium intake to less than 100 mmol/day (less than 2.3 gm of sodium or less than 6 gm of sodium choride)   This patient was seen by [place] with Dr Lavera Guise as a part of collaborative care agreement  Orders Placed This Encounter  Procedures  . MM DIGITAL SCREENING BILATERAL     Total time spent: 25  Minutes   Time spent includes review of chart, medications, test results, and follow up plan with the patient.      Dr Lavera Guise Internal medicine

## 2020-05-17 DIAGNOSIS — J3081 Allergic rhinitis due to animal (cat) (dog) hair and dander: Secondary | ICD-10-CM | POA: Diagnosis not present

## 2020-05-17 DIAGNOSIS — J3089 Other allergic rhinitis: Secondary | ICD-10-CM | POA: Diagnosis not present

## 2020-05-17 DIAGNOSIS — J301 Allergic rhinitis due to pollen: Secondary | ICD-10-CM | POA: Diagnosis not present

## 2020-05-24 DIAGNOSIS — J301 Allergic rhinitis due to pollen: Secondary | ICD-10-CM | POA: Diagnosis not present

## 2020-05-24 DIAGNOSIS — J3081 Allergic rhinitis due to animal (cat) (dog) hair and dander: Secondary | ICD-10-CM | POA: Diagnosis not present

## 2020-05-24 DIAGNOSIS — J3089 Other allergic rhinitis: Secondary | ICD-10-CM | POA: Diagnosis not present

## 2020-05-29 DIAGNOSIS — J3089 Other allergic rhinitis: Secondary | ICD-10-CM | POA: Diagnosis not present

## 2020-05-29 DIAGNOSIS — J3081 Allergic rhinitis due to animal (cat) (dog) hair and dander: Secondary | ICD-10-CM | POA: Diagnosis not present

## 2020-05-29 DIAGNOSIS — J301 Allergic rhinitis due to pollen: Secondary | ICD-10-CM | POA: Diagnosis not present

## 2020-06-01 DIAGNOSIS — J301 Allergic rhinitis due to pollen: Secondary | ICD-10-CM | POA: Diagnosis not present

## 2020-06-04 DIAGNOSIS — Z1231 Encounter for screening mammogram for malignant neoplasm of breast: Secondary | ICD-10-CM | POA: Diagnosis not present

## 2020-06-05 DIAGNOSIS — J3081 Allergic rhinitis due to animal (cat) (dog) hair and dander: Secondary | ICD-10-CM | POA: Diagnosis not present

## 2020-06-05 DIAGNOSIS — J3089 Other allergic rhinitis: Secondary | ICD-10-CM | POA: Diagnosis not present

## 2020-06-05 DIAGNOSIS — J301 Allergic rhinitis due to pollen: Secondary | ICD-10-CM | POA: Diagnosis not present

## 2020-06-06 ENCOUNTER — Other Ambulatory Visit: Payer: Self-pay

## 2020-06-06 DIAGNOSIS — I10 Essential (primary) hypertension: Secondary | ICD-10-CM

## 2020-06-06 MED ORDER — AMLODIPINE BESYLATE 2.5 MG PO TABS: 5.0000 mg | ORAL_TABLET | Freq: Every day | ORAL | 1 refills | Status: DC

## 2020-06-07 ENCOUNTER — Encounter: Payer: Self-pay | Admitting: Internal Medicine

## 2020-06-07 ENCOUNTER — Ambulatory Visit (INDEPENDENT_AMBULATORY_CARE_PROVIDER_SITE_OTHER): Payer: Medicare Other | Admitting: Internal Medicine

## 2020-06-07 ENCOUNTER — Other Ambulatory Visit: Payer: Self-pay

## 2020-06-07 DIAGNOSIS — G4733 Obstructive sleep apnea (adult) (pediatric): Secondary | ICD-10-CM

## 2020-06-07 DIAGNOSIS — I10 Essential (primary) hypertension: Secondary | ICD-10-CM | POA: Diagnosis not present

## 2020-06-07 DIAGNOSIS — Z9989 Dependence on other enabling machines and devices: Secondary | ICD-10-CM | POA: Diagnosis not present

## 2020-06-07 NOTE — Progress Notes (Signed)
[facility] [place], [place] [address]  Pulmonary Sleep Medicine   Office Visit Note  Patient Name: Claudia Martin DOB: [birth year] MRN [id number]  Date of Service: 06/07/2020  Complaints/HPI: Patient is here for routine pulmonary follow-up OSA being treated CPAP. Reports nightly compliance with her CPAP. Feels much better after sleeping with her CPAP. No headaches or daytime sleepiness reported. Cleaning her machine by hand with warm water and vinegar weekly. Allows water chamber to dry each day. Changing her tubing and masks as directed. Averaging 8-9 hours of sleep each night.  ROS  General: (-) fever, (-) chills, (-) night sweats, (-) weakness  Skin: (-) rashes, (-) itching,. Eyes: (-) visual changes, (-) redness, (-) itching. Nose and Sinuses: (-) nasal stuffiness or itchiness, (-) postnasal drip, (-) nosebleeds, (-) sinus trouble. Mouth and Throat: (-) sore throat, (-) hoarseness. Neck: (-) swollen glands, (-) enlarged thyroid, (-) neck pain. Respiratory: - cough, (-) bloody sputum, - shortness of breath, - wheezing. Cardiovascular: - ankle swelling, (-) chest pain. Lymphatic: (-) lymph node enlargement. Neurologic: (-) numbness, (-) tingling. Psychiatric: (-) anxiety, (-) depression   Current Medication: Outpatient Encounter Medications as of 06/07/2020  Medication Sig Note   amLODipine (NORVASC) 2.5 MG tablet Take 2 tablets (5 mg total) by mouth daily.    atenolol (TENORMIN) 25 MG tablet takes 1 and 1/2 tablets twice a day.    Blood Glucose Monitoring Suppl (ACCU-CHEK AVIVA PLUS) w/Device KIT Use as directed    cetirizine (ZYRTEC) 10 MG tablet Take 10 mg by mouth daily.    chlorhexidine (PERIDEX) 0.12 % solution Use as directed 5 mLs in the mouth or throat 2 (two) times daily.    conjugated estrogens (PREMARIN) vaginal cream Use 1 applicatorful twice weekly.    EPINEPHrine (EPIPEN 2-PAK) 0.3 mg/0.3 mL IJ SOAJ injection as needed.  07/16/2015: Received from: [facility]   ergocalciferol (DRISDOL) 1.25 MG (50000 UT) capsule Take 1 capsule (50,000 Units total) by mouth once a week.    levothyroxine (SYNTHROID) 50 MCG tablet Take 1 tablet (50 mcg total) by mouth daily before breakfast.    olopatadine (PATANOL) 0.1 % ophthalmic solution instill 1 drop into both eyes twice a day for ITCHY EYES 07/16/2015: Received from: External Pharmacy   Olopatadine HCl (PATADAY) 0.2 % SOLN Place 1 drop into both eyes 2 (two) times daily.    valACYclovir (VALTREX) 1000 MG tablet Take 1 tablet (1,000 mg total) by mouth daily. Pt can 1 tablet twice a day if flare up for 7 days.    [DISCONTINUED] acyclovir ointment (ZOVIRAX) 5 % APPLY TO AFFECTED AREAS 6  TIMES DAILY FOR 7 DAYS (Patient not taking: Reported on 06/07/2020)    [DISCONTINUED] ciprofloxacin (CIPRO) 250 MG tablet Take 1 tablet (250 mg total) by mouth 2 (two) times daily. (Patient not taking: Reported on 06/07/2020)    [DISCONTINUED] fluconazole (DIFLUCAN) 150 MG tablet Take 1 tablet po once for fungal infection. May repeat dose in three days for persistent symptoms (Patient not taking: Reported on 06/07/2020)    [DISCONTINUED] ketoconazole (NIZORAL) 2 % cream Apply 1 application topically daily. (Patient not taking: Reported on 06/07/2020)    [DISCONTINUED] medroxyPROGESTERone (PROVERA) 2.5 MG tablet once daily. (Patient not taking: Reported on 06/07/2020) 07/16/2015: Received from: [facility]   [DISCONTINUED] metroNIDAZOLE (METROGEL) 0.75 % vaginal gel  (Patient not taking: Reported on 06/07/2020) 07/16/2015: Received from: [facility]   [DISCONTINUED] montelukast (SINGULAIR) 10 MG tablet Take  by mouth. (Patient not taking: Reported on 06/07/2020)    [DISCONTINUED] naproxen sodium (ALEVE) 220 MG tablet Take by mouth. (Patient not taking: Reported on 06/07/2020) 07/16/2015: Received from: [facility]   [DISCONTINUED]  nystatin cream (MYCOSTATIN) Apply 1 application topically 2 (two) times daily. (Patient not taking: Reported on 06/07/2020)    No facility-administered encounter medications on file as of 06/07/2020.    Surgical History: Past Surgical History:  Procedure Laterality Date   BREAST BIOPSY     COLON SURGERY     COLONOSCOPY WITH PROPOFOL N/A 03/15/2018   Procedure: COLONOSCOPY WITH PROPOFOL;  Surgeon: Lin Landsman, MD;  Location: [facility] ENDOSCOPY;  Service: Gastroenterology;  Laterality: N/A;   diverticulitis     ECTOPIC PREGNANCY SURGERY     fx thumb     HERNIA REPAIR      Medical History: Past Medical History:  Diagnosis Date   Abdominal pain    Allergic rhinitis    Candidiasis of vulva and vagina    Cellulitis and abscess    Diverticulosis    Dysuria    Eyelid inflammation    Hematuria syndrome    Hemorrhoids    Herpes genitalis    Herpes simplex    HTN (hypertension)    Hydronephrosis    Hypothyroidism    Incomplete bladder emptying    Internal hordeolum    Knee pain    Otalgia    Primary ovarian failure    Sciatica    Shortness of breath    Urethral diverticulum    UTI (lower urinary tract infection)    Vaginal atrophy    Vaginitis     Family History: Family History  Problem Relation Age of Onset   Tuberculosis Mother    Nephrolithiasis Brother     Social History: Social History   Socioeconomic History   Marital status: Divorced    Spouse name: Not on file   Number of children: Not on file   Years of education: Not on file   Highest education level: Not on file  Occupational History   Not on file  Tobacco Use   Smoking status: Never Smoker   Smokeless tobacco: Never Used  Scientific laboratory technician Use: Never used  Substance and Sexual Activity   Alcohol use: No   Drug use: Never   Sexual activity: Not on file  Other Topics Concern   Not on file  Social History Narrative   Not on file   Social  Determinants of Health   Financial Resource Strain:    Difficulty of Paying Living Expenses: Not on file  Food Insecurity:    Worried About Estate manager/land agent of Food in the Last Year: Not on file   [employer] of Food in the Last Year: Not on file  Transportation Needs:    Lack of Transportation (Medical): Not on file   Lack of Transportation (Non-Medical): Not on file  Physical Activity:    Days of Exercise per Week: Not on file   Minutes of Exercise per Session: Not on file  Stress:    Feeling of Stress : Not on file  Social Connections:    Frequency of Communication with Friends and Family: Not on file   Frequency of Social Gatherings with Friends and Family: Not on file   Attends Religious Services: Not on file   Active Member of Clubs or Organizations: Not on file   Attends Archivist Meetings: Not on file   Marital Status:  Not on file  Intimate Partner Violence:    Fear of Current or Ex-Partner: Not on file   Emotionally Abused: Not on file   Physically Abused: Not on file   Sexually Abused: Not on file    Vital Signs: Blood pressure 132/78, pulse 74, temperature 98.5 F (36.9 C), resp. rate 16, height _0  (1.397 m), weight 185 lb (83.9 kg), SpO2 96 %.  Examination: General Appearance: The patient is well-developed, well-nourished, and in no distress. Skin: Gross inspection of skin unremarkable. Head: normocephalic, no gross deformities. Eyes: no gross deformities noted. ENT: ears appear grossly normal no exudates. Neck: Supple. No thyromegaly. No LAD. Respiratory: Clear throughout. Cardiovascular: Normal S1 and S2 without murmur or rub. Extremities: No cyanosis. pulses are equal. Neurologic: Alert and oriented. No involuntary movements.  LABS: No results found for this or any previous visit (from the past 2160 hour(s)).  Radiology: No results found.   Assessment and Plan: Patient Active Problem List   Diagnosis Date Noted   Urinary  tract infection without hematuria 01/04/2020   Candidiasis of skin 01/04/2020   Ventral hernia without obstruction or gangrene 11/28/2019   Herpes simplex infection 10/27/2019   Stomatitis and mucositis 10/27/2019   Encounter for general adult medical examination with abnormal findings 10/27/2019   Vitamin B12 deficiency 06/30/2018   Unspecified menopausal and perimenopausal disorder 06/30/2018   Vitamin D deficiency 06/30/2018   Flu vaccine need 06/30/2018   Urinary tract infection with hematuria 02/05/2018   Acquired hypothyroidism 02/05/2018   Essential hypertension 02/05/2018   Screening for breast cancer 02/05/2018   Dysuria 02/05/2018   Obstructive sleep apnea (adult) (pediatric) 01/06/2018    1. OSA on CPAP Continue with nightly CPAP compliance.  2. Essential hypertension BP controlled today, continue with current therapy and follow-up with PCP.  General Counseling: I have discussed the findings of the evaluation and examination with Verdis Frederickson.  I have also discussed any further diagnostic evaluation thatmay be needed or ordered today. Tymika verbalizes understanding of the findings of todays visit. We also reviewed her medications today and discussed drug interactions and side effects including but not limited excessive drowsiness and altered mental states. We also discussed that there is always a risk not just to her but also people around her. she has been encouraged to call the office with any questions or concerns that should arise related to todays visit.     Time spent: 25  I have personally obtained a history, examined the patient, evaluated laboratory and imaging results, formulated the assessment and plan and placed orders. This patient was seen by Casey Burkitt AGNP-C in Collaboration with Dr. Devona Konig as a part of collaborative care agreement.    Allyne Gee, MD [facility] Pulmonary and Critical Care Sleep medicine

## 2020-06-08 ENCOUNTER — Encounter: Payer: Self-pay | Admitting: Internal Medicine

## 2020-06-08 NOTE — Patient Instructions (Signed)

## 2020-06-12 DIAGNOSIS — J301 Allergic rhinitis due to pollen: Secondary | ICD-10-CM | POA: Diagnosis not present

## 2020-06-12 DIAGNOSIS — J3089 Other allergic rhinitis: Secondary | ICD-10-CM | POA: Diagnosis not present

## 2020-06-12 DIAGNOSIS — J3081 Allergic rhinitis due to animal (cat) (dog) hair and dander: Secondary | ICD-10-CM | POA: Diagnosis not present

## 2020-06-14 DIAGNOSIS — J301 Allergic rhinitis due to pollen: Secondary | ICD-10-CM | POA: Diagnosis not present

## 2020-06-14 DIAGNOSIS — J3089 Other allergic rhinitis: Secondary | ICD-10-CM | POA: Diagnosis not present

## 2020-06-14 DIAGNOSIS — J3081 Allergic rhinitis due to animal (cat) (dog) hair and dander: Secondary | ICD-10-CM | POA: Diagnosis not present

## 2020-06-19 DIAGNOSIS — J3081 Allergic rhinitis due to animal (cat) (dog) hair and dander: Secondary | ICD-10-CM | POA: Diagnosis not present

## 2020-06-19 DIAGNOSIS — J301 Allergic rhinitis due to pollen: Secondary | ICD-10-CM | POA: Diagnosis not present

## 2020-06-19 DIAGNOSIS — J3089 Other allergic rhinitis: Secondary | ICD-10-CM | POA: Diagnosis not present

## 2020-06-21 DIAGNOSIS — J3081 Allergic rhinitis due to animal (cat) (dog) hair and dander: Secondary | ICD-10-CM | POA: Diagnosis not present

## 2020-06-21 DIAGNOSIS — J3089 Other allergic rhinitis: Secondary | ICD-10-CM | POA: Diagnosis not present

## 2020-06-26 DIAGNOSIS — J3089 Other allergic rhinitis: Secondary | ICD-10-CM | POA: Diagnosis not present

## 2020-06-26 DIAGNOSIS — J301 Allergic rhinitis due to pollen: Secondary | ICD-10-CM | POA: Diagnosis not present

## 2020-06-27 DIAGNOSIS — G4733 Obstructive sleep apnea (adult) (pediatric): Secondary | ICD-10-CM | POA: Diagnosis not present

## 2020-06-28 DIAGNOSIS — J3081 Allergic rhinitis due to animal (cat) (dog) hair and dander: Secondary | ICD-10-CM | POA: Diagnosis not present

## 2020-06-28 DIAGNOSIS — J3089 Other allergic rhinitis: Secondary | ICD-10-CM | POA: Diagnosis not present

## 2020-06-28 DIAGNOSIS — J301 Allergic rhinitis due to pollen: Secondary | ICD-10-CM | POA: Diagnosis not present

## 2020-07-03 DIAGNOSIS — J3089 Other allergic rhinitis: Secondary | ICD-10-CM | POA: Diagnosis not present

## 2020-07-03 DIAGNOSIS — J3081 Allergic rhinitis due to animal (cat) (dog) hair and dander: Secondary | ICD-10-CM | POA: Diagnosis not present

## 2020-07-03 DIAGNOSIS — J301 Allergic rhinitis due to pollen: Secondary | ICD-10-CM | POA: Diagnosis not present

## 2020-07-05 DIAGNOSIS — J3089 Other allergic rhinitis: Secondary | ICD-10-CM | POA: Diagnosis not present

## 2020-07-05 DIAGNOSIS — J301 Allergic rhinitis due to pollen: Secondary | ICD-10-CM | POA: Diagnosis not present

## 2020-07-05 DIAGNOSIS — J3081 Allergic rhinitis due to animal (cat) (dog) hair and dander: Secondary | ICD-10-CM | POA: Diagnosis not present

## 2020-07-10 DIAGNOSIS — J3089 Other allergic rhinitis: Secondary | ICD-10-CM | POA: Diagnosis not present

## 2020-07-10 DIAGNOSIS — J3081 Allergic rhinitis due to animal (cat) (dog) hair and dander: Secondary | ICD-10-CM | POA: Diagnosis not present

## 2020-07-10 DIAGNOSIS — J301 Allergic rhinitis due to pollen: Secondary | ICD-10-CM | POA: Diagnosis not present

## 2020-07-12 DIAGNOSIS — J301 Allergic rhinitis due to pollen: Secondary | ICD-10-CM | POA: Diagnosis not present

## 2020-07-17 DIAGNOSIS — J3089 Other allergic rhinitis: Secondary | ICD-10-CM | POA: Diagnosis not present

## 2020-07-17 DIAGNOSIS — J3081 Allergic rhinitis due to animal (cat) (dog) hair and dander: Secondary | ICD-10-CM | POA: Diagnosis not present

## 2020-07-17 DIAGNOSIS — J301 Allergic rhinitis due to pollen: Secondary | ICD-10-CM | POA: Diagnosis not present

## 2020-07-19 DIAGNOSIS — J301 Allergic rhinitis due to pollen: Secondary | ICD-10-CM | POA: Diagnosis not present

## 2020-07-24 DIAGNOSIS — J3081 Allergic rhinitis due to animal (cat) (dog) hair and dander: Secondary | ICD-10-CM | POA: Diagnosis not present

## 2020-07-24 DIAGNOSIS — J301 Allergic rhinitis due to pollen: Secondary | ICD-10-CM | POA: Diagnosis not present

## 2020-07-24 DIAGNOSIS — J3089 Other allergic rhinitis: Secondary | ICD-10-CM | POA: Diagnosis not present

## 2020-07-26 DIAGNOSIS — J3081 Allergic rhinitis due to animal (cat) (dog) hair and dander: Secondary | ICD-10-CM | POA: Diagnosis not present

## 2020-07-26 DIAGNOSIS — J3089 Other allergic rhinitis: Secondary | ICD-10-CM | POA: Diagnosis not present

## 2020-07-31 DIAGNOSIS — J3081 Allergic rhinitis due to animal (cat) (dog) hair and dander: Secondary | ICD-10-CM | POA: Diagnosis not present

## 2020-07-31 DIAGNOSIS — J3089 Other allergic rhinitis: Secondary | ICD-10-CM | POA: Diagnosis not present

## 2020-07-31 DIAGNOSIS — J301 Allergic rhinitis due to pollen: Secondary | ICD-10-CM | POA: Diagnosis not present

## 2020-08-02 DIAGNOSIS — J3089 Other allergic rhinitis: Secondary | ICD-10-CM | POA: Diagnosis not present

## 2020-08-02 DIAGNOSIS — J3081 Allergic rhinitis due to animal (cat) (dog) hair and dander: Secondary | ICD-10-CM | POA: Diagnosis not present

## 2020-08-07 DIAGNOSIS — J3089 Other allergic rhinitis: Secondary | ICD-10-CM | POA: Diagnosis not present

## 2020-08-07 DIAGNOSIS — J301 Allergic rhinitis due to pollen: Secondary | ICD-10-CM | POA: Diagnosis not present

## 2020-08-07 DIAGNOSIS — J3081 Allergic rhinitis due to animal (cat) (dog) hair and dander: Secondary | ICD-10-CM | POA: Diagnosis not present

## 2020-08-09 DIAGNOSIS — J3089 Other allergic rhinitis: Secondary | ICD-10-CM | POA: Diagnosis not present

## 2020-08-09 DIAGNOSIS — J3081 Allergic rhinitis due to animal (cat) (dog) hair and dander: Secondary | ICD-10-CM | POA: Diagnosis not present

## 2020-08-14 DIAGNOSIS — J301 Allergic rhinitis due to pollen: Secondary | ICD-10-CM | POA: Diagnosis not present

## 2020-08-14 DIAGNOSIS — J3089 Other allergic rhinitis: Secondary | ICD-10-CM | POA: Diagnosis not present

## 2020-08-14 DIAGNOSIS — J3081 Allergic rhinitis due to animal (cat) (dog) hair and dander: Secondary | ICD-10-CM | POA: Diagnosis not present

## 2020-08-21 DIAGNOSIS — J3081 Allergic rhinitis due to animal (cat) (dog) hair and dander: Secondary | ICD-10-CM | POA: Diagnosis not present

## 2020-08-21 DIAGNOSIS — J301 Allergic rhinitis due to pollen: Secondary | ICD-10-CM | POA: Diagnosis not present

## 2020-08-21 DIAGNOSIS — J3089 Other allergic rhinitis: Secondary | ICD-10-CM | POA: Diagnosis not present

## 2020-08-27 ENCOUNTER — Other Ambulatory Visit: Payer: Self-pay

## 2020-08-28 DIAGNOSIS — J3081 Allergic rhinitis due to animal (cat) (dog) hair and dander: Secondary | ICD-10-CM | POA: Diagnosis not present

## 2020-08-28 DIAGNOSIS — J301 Allergic rhinitis due to pollen: Secondary | ICD-10-CM | POA: Diagnosis not present

## 2020-08-28 DIAGNOSIS — J3089 Other allergic rhinitis: Secondary | ICD-10-CM | POA: Diagnosis not present

## 2020-08-29 ENCOUNTER — Ambulatory Visit: Payer: Medicare Other

## 2020-09-04 DIAGNOSIS — J301 Allergic rhinitis due to pollen: Secondary | ICD-10-CM | POA: Diagnosis not present

## 2020-09-04 DIAGNOSIS — J3089 Other allergic rhinitis: Secondary | ICD-10-CM | POA: Diagnosis not present

## 2020-09-04 DIAGNOSIS — J3081 Allergic rhinitis due to animal (cat) (dog) hair and dander: Secondary | ICD-10-CM | POA: Diagnosis not present

## 2020-09-05 ENCOUNTER — Other Ambulatory Visit: Payer: Self-pay

## 2020-09-05 ENCOUNTER — Ambulatory Visit (INDEPENDENT_AMBULATORY_CARE_PROVIDER_SITE_OTHER): Payer: Medicare Other

## 2020-09-05 DIAGNOSIS — G4733 Obstructive sleep apnea (adult) (pediatric): Secondary | ICD-10-CM | POA: Diagnosis not present

## 2020-09-05 NOTE — Progress Notes (Signed)
95 percentile pressure 7   95th percentile leak 0.0   apnea index 1.2 /hr  apnea-hypopnea index  2.3 /hr   total days used  >4 hr 88 days  total days used <4 hr 1 days  Total compliance 98 percent  She is doing great no problems or questions at this time. She is eligible for new cpap as of Oct.

## 2020-09-11 DIAGNOSIS — J3081 Allergic rhinitis due to animal (cat) (dog) hair and dander: Secondary | ICD-10-CM | POA: Diagnosis not present

## 2020-09-11 DIAGNOSIS — J3089 Other allergic rhinitis: Secondary | ICD-10-CM | POA: Diagnosis not present

## 2020-09-11 DIAGNOSIS — J301 Allergic rhinitis due to pollen: Secondary | ICD-10-CM | POA: Diagnosis not present

## 2020-09-14 ENCOUNTER — Other Ambulatory Visit: Payer: Self-pay

## 2020-09-14 ENCOUNTER — Encounter: Payer: Self-pay | Admitting: Nurse Practitioner

## 2020-09-14 ENCOUNTER — Ambulatory Visit (INDEPENDENT_AMBULATORY_CARE_PROVIDER_SITE_OTHER): Payer: Medicare Other | Admitting: Nurse Practitioner

## 2020-09-14 VITALS — BP 142/80 | HR 65 | Temp 97.4°F | Resp 16 | Ht <= 58 in | Wt 182.6 lb

## 2020-09-14 DIAGNOSIS — B009 Herpesviral infection, unspecified: Secondary | ICD-10-CM

## 2020-09-14 DIAGNOSIS — R300 Dysuria: Secondary | ICD-10-CM

## 2020-09-14 DIAGNOSIS — E039 Hypothyroidism, unspecified: Secondary | ICD-10-CM

## 2020-09-14 DIAGNOSIS — N390 Urinary tract infection, site not specified: Secondary | ICD-10-CM

## 2020-09-14 DIAGNOSIS — I10 Essential (primary) hypertension: Secondary | ICD-10-CM | POA: Diagnosis not present

## 2020-09-14 LAB — POCT URINALYSIS DIPSTICK
Blood, UA: NEGATIVE
Glucose, UA: NEGATIVE
Ketones, UA: NEGATIVE
Nitrite, UA: NEGATIVE
Protein, UA: POSITIVE — AB
Spec Grav, UA: 1.025 (ref 1.010–1.025)
Urobilinogen, UA: 0.2 E.U./dL
pH, UA: 5 (ref 5.0–8.0)

## 2020-09-14 MED ORDER — ATENOLOL 25 MG PO TABS: ORAL_TABLET | ORAL | 1 refills | Status: DC

## 2020-09-14 MED ORDER — LEVOTHYROXINE SODIUM 50 MCG PO TABS: 50.0000 ug | ORAL_TABLET | Freq: Every day | ORAL | 1 refills | Status: DC

## 2020-09-14 MED ORDER — VALACYCLOVIR HCL 1 G PO TABS: 1000.0000 mg | ORAL_TABLET | Freq: Every day | ORAL | 1 refills | Status: DC

## 2020-09-14 MED ORDER — CIPROFLOXACIN HCL 250 MG PO TABS: 250.0000 mg | ORAL_TABLET | Freq: Two times a day (BID) | ORAL | 0 refills | Status: DC

## 2020-09-14 NOTE — Progress Notes (Signed)
[facility] [place], [place] [address]  Internal MEDICINE  Office Visit Note  Patient Name: Claudia Martin  [id number]  [id number]  Date of Service: 10/14/2020  Chief Complaint  Patient presents with  . Follow-up    Pt might have bladder or yeast infection, x2weeks, urinary frequency, odor, started to feel better a couple days ago  . Hypertension  . controlled substance form    The patient is here for routine follow up. Has been having urinary frequency on and off for the past few weeks. She also noted an odor to the urine. She states that symptoms improved over the past week, not as bad as they were. She denies abdominal pain, nausea, or vomiting. The urine sample is positive for protein and small WBC.  -blood pressure is mildly elevated, but overall, well controlled.  -needs refills for current medication.      Current Medication: Outpatient Encounter Medications as of 09/14/2020  Medication Sig Note  . amLODipine (NORVASC) 2.5 MG tablet Take 2 tablets (5 mg total) by mouth daily.   . Blood Glucose Monitoring Suppl (ACCU-CHEK AVIVA PLUS) w/Device KIT Use as directed   . cetirizine (ZYRTEC) 10 MG tablet Take 10 mg by mouth daily.   . chlorhexidine (PERIDEX) 0.12 % solution Use as directed 5 mLs in the mouth or throat 2 (two) times daily.   Marland Kitchen conjugated estrogens (PREMARIN) vaginal cream Use 1 applicatorful twice weekly.   Marland Kitchen EPINEPHrine 0.3 mg/0.3 mL IJ SOAJ injection as needed. 07/16/2015: Received from: [facility]  . ergocalciferol (DRISDOL) 1.25 MG (50000 UT) capsule Take 1 capsule (50,000 Units total) by mouth once a week.   Marland Kitchen olopatadine (PATANOL) 0.1 % ophthalmic solution instill 1 drop into both eyes twice a day for ITCHY EYES 07/16/2015: Received from: External Pharmacy  . Olopatadine HCl 0.2 % SOLN Place 1 drop into both eyes 2 (two) times daily.   . [DISCONTINUED] atenolol (TENORMIN) 25 MG tablet takes 1 and 1/2  tablets twice a day.   . [DISCONTINUED] levothyroxine (SYNTHROID) 50 MCG tablet Take 1 tablet (50 mcg total) by mouth daily before breakfast.   . [DISCONTINUED] valACYclovir (VALTREX) 1000 MG tablet Take 1 tablet (1,000 mg total) by mouth daily. Pt can 1 tablet twice a day if flare up for 7 days.   Marland Kitchen atenolol (TENORMIN) 25 MG tablet takes 1 and 1/2 tablets twice a day.   . ciprofloxacin (CIPRO) 250 MG tablet Take 1 tablet (250 mg total) by mouth 2 (two) times daily.   Marland Kitchen levothyroxine (SYNTHROID) 50 MCG tablet Take 1 tablet (50 mcg total) by mouth daily before breakfast.   . valACYclovir (VALTREX) 1000 MG tablet Take 1 tablet (1,000 mg total) by mouth daily. Pt can 1 tablet twice a day if flare up for 7 days.    No facility-administered encounter medications on file as of 09/14/2020.    Surgical History: Past Surgical History:  Procedure Laterality Date  . BREAST BIOPSY    . COLON SURGERY    . COLONOSCOPY WITH PROPOFOL N/A 03/15/2018   Procedure: COLONOSCOPY WITH PROPOFOL;  Surgeon: Lin Landsman, MD;  Location: [facility] ENDOSCOPY;  Service: Gastroenterology;  Laterality: N/A;  . diverticulitis    . ECTOPIC PREGNANCY SURGERY    . fx thumb    . HERNIA REPAIR      Medical History: Past Medical History:  Diagnosis Date  . Abdominal pain   . Allergic rhinitis   . Candidiasis of vulva  and vagina   . Cellulitis and abscess   . Diverticulosis   . Dysuria   . Eyelid inflammation   . Hematuria syndrome   . Hemorrhoids   . Herpes genitalis   . Herpes simplex   . HTN (hypertension)   . Hydronephrosis   . Hypothyroidism   . Incomplete bladder emptying   . Internal hordeolum   . Knee pain   . Otalgia   . Primary ovarian failure   . Sciatica   . Shortness of breath   . Urethral diverticulum   . UTI (lower urinary tract infection)   . Vaginal atrophy   . Vaginitis     Family History: Family History  Problem Relation Age of Onset  . Tuberculosis Mother   . Nephrolithiasis  Brother     Social History   Socioeconomic History  . Marital status: Divorced    Spouse name: Not on file  . Number of children: Not on file  . Years of education: Not on file  . Highest education level: Not on file  Occupational History  . Not on file  Tobacco Use  . Smoking status: Never Smoker  . Smokeless tobacco: Never Used  Vaping Use  . Vaping Use: Never used  Substance and Sexual Activity  . Alcohol use: No  . Drug use: Never  . Sexual activity: Not on file  Other Topics Concern  . Not on file  Social History Narrative  . Not on file   Social Determinants of Health   Financial Resource Strain: Not on file  Food Insecurity: Not on file  Transportation Needs: Not on file  Physical Activity: Not on file  Stress: Not on file  Social Connections: Not on file  Intimate Partner Violence: Not on file      Review of Systems  Constitutional: Negative for activity change, chills, fatigue and unexpected weight change.  HENT: Negative for congestion, postnasal drip, rhinorrhea, sneezing and sore throat.   Respiratory: Negative for cough, chest tightness, shortness of breath and wheezing.   Cardiovascular: Negative for chest pain and palpitations.       Mildly elevated blood pressure today  Gastrointestinal: Negative for abdominal pain, constipation, diarrhea, nausea and vomiting.  Endocrine: Negative for cold intolerance, heat intolerance, polydipsia and polyuria.       Well managed hypothyroid  Genitourinary: Positive for dysuria, frequency and urgency.       Strong odor to urine.  Musculoskeletal: Positive for arthralgias and back pain. Negative for joint swelling and neck pain.  Skin: Negative for rash.  Allergic/Immunologic: Negative for environmental allergies.  Neurological: Negative for dizziness, tremors, numbness and headaches.  Hematological: Negative for adenopathy. Does not bruise/bleed easily.  Psychiatric/Behavioral: Negative for behavioral problems  (Depression), sleep disturbance and suicidal ideas. The patient is not nervous/anxious.     Today's Vitals   09/14/20 1053  BP: (!) 142/80  Pulse: 65  Resp: 16  Temp: (!) 97.4 F (36.3 C)  SpO2: 98%  Weight: 182 lb 9.6 oz (82.8 kg)  Height: _0  (1.397 m)   Body mass index is 42.44 kg/m.  Physical Exam Vitals and nursing note reviewed.  Constitutional:      General: She is not in acute distress.    Appearance: Normal appearance. She is well-developed and well-nourished. She is not diaphoretic.  HENT:     Head: Normocephalic and atraumatic.     Mouth/Throat:     Mouth: Oropharynx is clear and moist.     Pharynx: No  oropharyngeal exudate.  Eyes:     Extraocular Movements: EOM normal.     Pupils: Pupils are equal, round, and reactive to light.  Neck:     Thyroid: No thyromegaly.     Vascular: No carotid bruit or JVD.     Trachea: No tracheal deviation.  Cardiovascular:     Rate and Rhythm: Normal rate and regular rhythm.     Heart sounds: Normal heart sounds. No murmur heard. No friction rub. No gallop.   Pulmonary:     Effort: Pulmonary effort is normal. No respiratory distress.     Breath sounds: Normal breath sounds. No wheezing or rales.  Chest:     Chest wall: No tenderness.  Abdominal:     General: Bowel sounds are normal.     Palpations: Abdomen is soft.     Tenderness: There is no abdominal tenderness.  Genitourinary:    Comments: Urine sample positive for protein and small WBC Musculoskeletal:        General: Normal range of motion.     Cervical back: Normal range of motion and neck supple.  Lymphadenopathy:     Cervical: No cervical adenopathy.  Skin:    General: Skin is warm and dry.  Neurological:     Mental Status: She is alert and oriented to person, place, and time.     Cranial Nerves: No cranial nerve deficit.  Psychiatric:        Mood and Affect: Mood and affect and mood normal.        Behavior: Behavior normal.        Thought Content:  Thought content normal.        Judgment: Judgment normal.    Assessment/Plan: 1. Urinary tract infection without hematuria, site unspecified Start cipro $RemoveBef'250mg'[id number]$  twice daily for 7 days. Send urine for culture and sensitivity and adjust antibiotics as indicated.  - ciprofloxacin (CIPRO) 250 MG tablet; Take 1 tablet (250 mg total) by mouth 2 (two) times daily.  Dispense: 14 tablet; Refill: 0  2. Dysuria Treat with antibiotics and adjust treatment as indicated  - POCT Urinalysis Dipstick - CULTURE, URINE COMPREHENSIVE  3. Essential hypertension Stable. Taking atenolol $RemoveBeforeD'25mg'[id number]$ , 1.5 tablets daily.  - atenolol (TENORMIN) 25 MG tablet; takes 1 and 1/2 tablets twice a day.  Dispense: 180 tablet; Refill: 1  4. Acquired hypothyroidism Stable. Thyroid panel. Continue levothyroxine as prescribed  - levothyroxine (SYNTHROID) 50 MCG tablet; Take 1 tablet (50 mcg total) by mouth daily before breakfast.  Dispense: 90 tablet; Refill: 1  5. Herpes simplex infection Continue valacyclovir 1000gm daily. - valACYclovir (VALTREX) 1000 MG tablet; Take 1 tablet (1,000 mg total) by mouth daily. Pt can 1 tablet twice a day if flare up for 7 days.  Dispense: 90 tablet; Refill: 1  General Counseling: Romana verbalizes understanding of the findings of todays visit and agrees with plan of treatment. I have discussed any further diagnostic evaluation that may be needed or ordered today. We also reviewed her medications today. she has been encouraged to call the office with any questions or concerns that should arise related to todays visit.  This patient was seen by Leretha Pol FNP Collaboration with Dr Lavera Guise as a part of collaborative care agreement  Orders Placed This Encounter  Procedures  . CULTURE, URINE COMPREHENSIVE  . POCT Urinalysis Dipstick    Meds ordered this encounter  Medications  . valACYclovir (VALTREX) 1000 MG tablet    Sig: Take 1 tablet (1,000 mg total) by  mouth daily. Pt can 1 tablet  twice a day if flare up for 7 days.    Dispense:  90 tablet    Refill:  1    Order Specific Question:   Supervising Provider    Answer:   Lavera Guise [[id number]]  . levothyroxine (SYNTHROID) 50 MCG tablet    Sig: Take 1 tablet (50 mcg total) by mouth daily before breakfast.    Dispense:  90 tablet    Refill:  1    Order Specific Question:   Supervising Provider    Answer:   Lavera Guise [place]  . atenolol (TENORMIN) 25 MG tablet    Sig: takes 1 and 1/2 tablets twice a day.    Dispense:  180 tablet    Refill:  1    Order Specific Question:   Supervising Provider    Answer:   Lavera Guise [[id number]]  . ciprofloxacin (CIPRO) 250 MG tablet    Sig: Take 1 tablet (250 mg total) by mouth 2 (two) times daily.    Dispense:  14 tablet    Refill:  0    Order Specific Question:   Supervising Provider    Answer:   Lavera Guise [[id number]]    Total time spent: 30 Minutes   Time spent includes review of chart, medications, test results, and follow up plan with the patient.      Dr Lavera Guise Internal medicine

## 2020-09-18 DIAGNOSIS — J301 Allergic rhinitis due to pollen: Secondary | ICD-10-CM | POA: Diagnosis not present

## 2020-09-18 DIAGNOSIS — J3081 Allergic rhinitis due to animal (cat) (dog) hair and dander: Secondary | ICD-10-CM | POA: Diagnosis not present

## 2020-09-18 DIAGNOSIS — J3089 Other allergic rhinitis: Secondary | ICD-10-CM | POA: Diagnosis not present

## 2020-09-18 NOTE — Progress Notes (Signed)
Patient started on cipro 250mg  twice daily at time of visit.

## 2020-09-19 LAB — CULTURE, URINE COMPREHENSIVE

## 2020-09-25 DIAGNOSIS — J301 Allergic rhinitis due to pollen: Secondary | ICD-10-CM | POA: Diagnosis not present

## 2020-09-25 DIAGNOSIS — J3089 Other allergic rhinitis: Secondary | ICD-10-CM | POA: Diagnosis not present

## 2020-09-25 DIAGNOSIS — J3081 Allergic rhinitis due to animal (cat) (dog) hair and dander: Secondary | ICD-10-CM | POA: Diagnosis not present

## 2020-09-27 DIAGNOSIS — G4733 Obstructive sleep apnea (adult) (pediatric): Secondary | ICD-10-CM | POA: Diagnosis not present

## 2020-10-02 DIAGNOSIS — J3089 Other allergic rhinitis: Secondary | ICD-10-CM | POA: Diagnosis not present

## 2020-10-02 DIAGNOSIS — J3081 Allergic rhinitis due to animal (cat) (dog) hair and dander: Secondary | ICD-10-CM | POA: Diagnosis not present

## 2020-10-02 DIAGNOSIS — J301 Allergic rhinitis due to pollen: Secondary | ICD-10-CM | POA: Diagnosis not present

## 2020-10-11 DIAGNOSIS — J301 Allergic rhinitis due to pollen: Secondary | ICD-10-CM | POA: Diagnosis not present

## 2020-10-11 DIAGNOSIS — J3089 Other allergic rhinitis: Secondary | ICD-10-CM | POA: Diagnosis not present

## 2020-10-11 DIAGNOSIS — J3081 Allergic rhinitis due to animal (cat) (dog) hair and dander: Secondary | ICD-10-CM | POA: Diagnosis not present

## 2020-10-16 DIAGNOSIS — J3089 Other allergic rhinitis: Secondary | ICD-10-CM | POA: Diagnosis not present

## 2020-10-16 DIAGNOSIS — J3081 Allergic rhinitis due to animal (cat) (dog) hair and dander: Secondary | ICD-10-CM | POA: Diagnosis not present

## 2020-10-16 DIAGNOSIS — J301 Allergic rhinitis due to pollen: Secondary | ICD-10-CM | POA: Diagnosis not present

## 2020-10-25 DIAGNOSIS — J301 Allergic rhinitis due to pollen: Secondary | ICD-10-CM | POA: Diagnosis not present

## 2020-10-25 DIAGNOSIS — J3081 Allergic rhinitis due to animal (cat) (dog) hair and dander: Secondary | ICD-10-CM | POA: Diagnosis not present

## 2020-10-25 DIAGNOSIS — J3089 Other allergic rhinitis: Secondary | ICD-10-CM | POA: Diagnosis not present

## 2020-10-30 DIAGNOSIS — J301 Allergic rhinitis due to pollen: Secondary | ICD-10-CM | POA: Diagnosis not present

## 2020-10-30 DIAGNOSIS — J3089 Other allergic rhinitis: Secondary | ICD-10-CM | POA: Diagnosis not present

## 2020-10-30 DIAGNOSIS — J3081 Allergic rhinitis due to animal (cat) (dog) hair and dander: Secondary | ICD-10-CM | POA: Diagnosis not present

## 2020-11-01 DIAGNOSIS — J301 Allergic rhinitis due to pollen: Secondary | ICD-10-CM | POA: Diagnosis not present

## 2020-11-01 DIAGNOSIS — J3089 Other allergic rhinitis: Secondary | ICD-10-CM | POA: Diagnosis not present

## 2020-11-01 DIAGNOSIS — J3081 Allergic rhinitis due to animal (cat) (dog) hair and dander: Secondary | ICD-10-CM | POA: Diagnosis not present

## 2020-11-08 DIAGNOSIS — J301 Allergic rhinitis due to pollen: Secondary | ICD-10-CM | POA: Diagnosis not present

## 2020-11-08 DIAGNOSIS — J3081 Allergic rhinitis due to animal (cat) (dog) hair and dander: Secondary | ICD-10-CM | POA: Diagnosis not present

## 2020-11-08 DIAGNOSIS — J3089 Other allergic rhinitis: Secondary | ICD-10-CM | POA: Diagnosis not present

## 2020-11-15 DIAGNOSIS — J3081 Allergic rhinitis due to animal (cat) (dog) hair and dander: Secondary | ICD-10-CM | POA: Diagnosis not present

## 2020-11-15 DIAGNOSIS — J3089 Other allergic rhinitis: Secondary | ICD-10-CM | POA: Diagnosis not present

## 2020-11-15 DIAGNOSIS — J301 Allergic rhinitis due to pollen: Secondary | ICD-10-CM | POA: Diagnosis not present

## 2020-11-20 ENCOUNTER — Other Ambulatory Visit: Payer: Self-pay | Admitting: Nurse Practitioner

## 2020-11-20 DIAGNOSIS — J301 Allergic rhinitis due to pollen: Secondary | ICD-10-CM | POA: Diagnosis not present

## 2020-11-20 DIAGNOSIS — E039 Hypothyroidism, unspecified: Secondary | ICD-10-CM | POA: Diagnosis not present

## 2020-11-20 DIAGNOSIS — J3089 Other allergic rhinitis: Secondary | ICD-10-CM | POA: Diagnosis not present

## 2020-11-20 DIAGNOSIS — J3081 Allergic rhinitis due to animal (cat) (dog) hair and dander: Secondary | ICD-10-CM | POA: Diagnosis not present

## 2020-11-20 DIAGNOSIS — R7301 Impaired fasting glucose: Secondary | ICD-10-CM | POA: Diagnosis not present

## 2020-11-20 DIAGNOSIS — I10 Essential (primary) hypertension: Secondary | ICD-10-CM | POA: Diagnosis not present

## 2020-11-20 DIAGNOSIS — E559 Vitamin D deficiency, unspecified: Secondary | ICD-10-CM | POA: Diagnosis not present

## 2020-11-20 DIAGNOSIS — Z0001 Encounter for general adult medical examination with abnormal findings: Secondary | ICD-10-CM | POA: Diagnosis not present

## 2020-11-21 LAB — CBC
Hematocrit: 42.2 % (ref 34.0–46.6)
Hemoglobin: 14.5 g/dL (ref 11.1–15.9)
MCH: 31 pg (ref 26.6–33.0)
MCHC: 34.4 g/dL (ref 31.5–35.7)
MCV: 90 fL (ref 79–97)
Platelets: 203 10*3/uL (ref 150–450)
RBC: 4.67 x10E6/uL (ref 3.77–5.28)
RDW: 13.2 % (ref 11.7–15.4)
WBC: 6.1 10*3/uL (ref 3.4–10.8)

## 2020-11-21 LAB — LIPID PANEL WITH LDL/HDL RATIO
Cholesterol, Total: 189 mg/dL (ref 100–199)
HDL: 38 mg/dL — ABNORMAL LOW (ref >39)
LDL Chol Calc (NIH): 130 mg/dL — ABNORMAL HIGH (ref 0–99)
LDL/HDL Ratio: 3.4 ratio — ABNORMAL HIGH (ref 0.0–3.2)
Triglycerides: 113 mg/dL (ref 0–149)
VLDL Cholesterol Cal: 21 mg/dL (ref 5–40)

## 2020-11-21 LAB — COMPREHENSIVE METABOLIC PANEL
ALT: 13 IU/L (ref 0–32)
AST: 17 IU/L (ref 0–40)
Albumin/Globulin Ratio: 1.2 (ref 1.2–2.2)
Albumin: 4.3 g/dL (ref 3.7–4.7)
Alkaline Phosphatase: 128 IU/L — ABNORMAL HIGH (ref 44–121)
BUN/Creatinine Ratio: 17 (ref 12–28)
BUN: 15 mg/dL (ref 8–27)
Bilirubin Total: 0.3 mg/dL (ref 0.0–1.2)
CO2: 22 mmol/L (ref 20–29)
Calcium: 9.7 mg/dL (ref 8.7–10.3)
Chloride: 101 mmol/L (ref 96–106)
Creatinine, Ser: 0.86 mg/dL (ref 0.57–1.00)
GFR calc Af Amer: 78 mL/min/{1.73_m2} (ref >59)
GFR calc non Af Amer: 67 mL/min/{1.73_m2} (ref >59)
Globulin, Total: 3.6 g/dL (ref 1.5–4.5)
Glucose: 107 mg/dL — ABNORMAL HIGH (ref 65–99)
Potassium: 3.8 mmol/L (ref 3.5–5.2)
Sodium: 141 mmol/L (ref 134–144)
Total Protein: 7.9 g/dL (ref 6.0–8.5)

## 2020-11-21 LAB — T4, FREE: Free T4: 0.97 ng/dL (ref 0.82–1.77)

## 2020-11-21 LAB — VITAMIN D 25 HYDROXY (VIT D DEFICIENCY, FRACTURES): Vit D, 25-Hydroxy: 29.4 ng/mL — ABNORMAL LOW (ref 30.0–100.0)

## 2020-11-21 LAB — TSH: TSH: 2.63 u[IU]/mL (ref 0.450–4.500)

## 2020-11-21 LAB — HGB A1C W/O EAG: Hgb A1c MFr Bld: 5.7 % — ABNORMAL HIGH (ref 4.8–5.6)

## 2020-11-23 NOTE — Progress Notes (Signed)
CPE with Lovena Le 11/30/2020

## 2020-11-27 DIAGNOSIS — J3081 Allergic rhinitis due to animal (cat) (dog) hair and dander: Secondary | ICD-10-CM | POA: Diagnosis not present

## 2020-11-27 DIAGNOSIS — J3089 Other allergic rhinitis: Secondary | ICD-10-CM | POA: Diagnosis not present

## 2020-11-27 DIAGNOSIS — J301 Allergic rhinitis due to pollen: Secondary | ICD-10-CM | POA: Diagnosis not present

## 2020-11-30 ENCOUNTER — Other Ambulatory Visit: Payer: Self-pay

## 2020-11-30 ENCOUNTER — Ambulatory Visit (INDEPENDENT_AMBULATORY_CARE_PROVIDER_SITE_OTHER): Payer: Medicare Other | Admitting: Hospice and Palliative Medicine

## 2020-11-30 ENCOUNTER — Encounter: Payer: Self-pay | Admitting: Hospice and Palliative Medicine

## 2020-11-30 VITALS — BP 132/84 | HR 68 | Temp 97.4°F | Resp 16 | Ht <= 58 in | Wt 186.0 lb

## 2020-11-30 DIAGNOSIS — E782 Mixed hyperlipidemia: Secondary | ICD-10-CM | POA: Diagnosis not present

## 2020-11-30 DIAGNOSIS — L304 Erythema intertrigo: Secondary | ICD-10-CM

## 2020-11-30 DIAGNOSIS — Z1211 Encounter for screening for malignant neoplasm of colon: Secondary | ICD-10-CM

## 2020-11-30 DIAGNOSIS — Z124 Encounter for screening for malignant neoplasm of cervix: Secondary | ICD-10-CM

## 2020-11-30 DIAGNOSIS — E119 Type 2 diabetes mellitus without complications: Secondary | ICD-10-CM

## 2020-11-30 DIAGNOSIS — Z0001 Encounter for general adult medical examination with abnormal findings: Secondary | ICD-10-CM | POA: Diagnosis not present

## 2020-11-30 DIAGNOSIS — I10 Essential (primary) hypertension: Secondary | ICD-10-CM | POA: Diagnosis not present

## 2020-11-30 DIAGNOSIS — R300 Dysuria: Secondary | ICD-10-CM

## 2020-11-30 LAB — POCT URINALYSIS DIPSTICK
Bilirubin, UA: NEGATIVE
Blood, UA: NEGATIVE
Glucose, UA: NEGATIVE
Ketones, UA: NEGATIVE
Leukocytes, UA: NEGATIVE
Nitrite, UA: NEGATIVE
Protein, UA: POSITIVE — AB
Spec Grav, UA: 1.025 (ref 1.010–1.025)
Urobilinogen, UA: 0.2 E.U./dL
pH, UA: 5 (ref 5.0–8.0)

## 2020-11-30 MED ORDER — AMLODIPINE BESYLATE 2.5 MG PO TABS: 5.0000 mg | ORAL_TABLET | Freq: Every day | ORAL | 1 refills | Status: DC

## 2020-11-30 MED ORDER — ROSUVASTATIN CALCIUM 5 MG PO TABS: 5.0000 mg | ORAL_TABLET | Freq: Every day | ORAL | 3 refills | Status: DC

## 2020-11-30 MED ORDER — NYSTATIN 100000 UNIT/GM EX POWD: 1.0000 "application " | Freq: Three times a day (TID) | CUTANEOUS | 5 refills | Status: DC

## 2020-11-30 NOTE — Progress Notes (Signed)
[facility] [address] [place], [place] [address]  Internal MEDICINE  Office Visit Note  Patient Name: Claudia Martin  12/26/1945  [id number]  Date of Service: 12/02/2020  Chief Complaint  Patient presents with  . Medicare Wellness  . Hypertension  . Quality Metric Gaps    Flu, mammogram     HPI Pt is here for routine health maintenance examination Overall things have been going well Reviewed recent labs--minimally elevated glucose levels, abnormal lipid panel, elevated A1C, vitamin D low Currently manages glucose levels through diet and exercise Not currently on statin therapy  Mammogram completed in September Needs updated colonoscopy No BMD on file  Current Medication: Outpatient Encounter Medications as of 11/30/2020  Medication Sig Note  . atenolol (TENORMIN) 25 MG tablet takes 1 and 1/2 tablets twice a day.   . Blood Glucose Monitoring Suppl (ACCU-CHEK AVIVA PLUS) w/Device KIT Use as directed   . cetirizine (ZYRTEC) 10 MG tablet Take 10 mg by mouth daily.   . chlorhexidine (PERIDEX) 0.12 % solution Use as directed 5 mLs in the mouth or throat 2 (two) times daily.   . conjugated estrogens (PREMARIN) vaginal cream Use 1 applicatorful twice weekly.   . EPINEPHrine 0.3 mg/0.3 mL IJ SOAJ injection as needed. 07/16/2015: Received from: [facility]  . ergocalciferol (DRISDOL) 1.25 MG (50000 UT) capsule Take 1 capsule (50,000 Units total) by mouth once a week.   . levothyroxine (SYNTHROID) 50 MCG tablet Take 1 tablet (50 mcg total) by mouth daily before breakfast.   . nystatin (MYCOSTATIN/NYSTOP) powder Apply 1 application topically 3 (three) times daily.   . olopatadine (PATANOL) 0.1 % ophthalmic solution instill 1 drop into both eyes twice a day for ITCHY EYES 07/16/2015: Received from: External Pharmacy  . Olopatadine HCl 0.2 % SOLN Place 1 drop into both eyes 2 (two) times daily.   . rosuvastatin (CRESTOR) 5 MG tablet Take 1  tablet (5 mg total) by mouth daily.   . valACYclovir (VALTREX) 1000 MG tablet Take 1 tablet (1,000 mg total) by mouth daily. Pt can 1 tablet twice a day if flare up for 7 days.   . [DISCONTINUED] amLODipine (NORVASC) 2.5 MG tablet Take 2 tablets (5 mg total) by mouth daily.   . [DISCONTINUED] ciprofloxacin (CIPRO) 250 MG tablet Take 1 tablet (250 mg total) by mouth 2 (two) times daily.   . amLODipine (NORVASC) 2.5 MG tablet Take 2 tablets (5 mg total) by mouth daily.    No facility-administered encounter medications on file as of 11/30/2020.    Surgical History: Past Surgical History:  Procedure Laterality Date  . BREAST BIOPSY    . COLON SURGERY    . COLONOSCOPY WITH PROPOFOL N/A 03/15/2018   Procedure: COLONOSCOPY WITH PROPOFOL;  Surgeon: Vanga, Rohini Reddy, MD;  Location: [facility] ENDOSCOPY;  Service: Gastroenterology;  Laterality: N/A;  . diverticulitis    . ECTOPIC PREGNANCY SURGERY    . fx thumb    . HERNIA REPAIR      Medical History: Past Medical History:  Diagnosis Date  . Abdominal pain   . Allergic rhinitis   . Candidiasis of vulva and vagina   . Cellulitis and abscess   . Diverticulosis   . Dysuria   . Eyelid inflammation   . Hematuria syndrome   . Hemorrhoids   . Herpes genitalis   . Herpes simplex   . HTN (hypertension)   . Hydronephrosis   . Hypothyroidism   . Incomplete bladder emptying   .   Internal hordeolum   . Knee pain   . Otalgia   . Primary ovarian failure   . Sciatica   . Shortness of breath   . Urethral diverticulum   . UTI (lower urinary tract infection)   . Vaginal atrophy   . Vaginitis     Family History: Family History  Problem Relation Age of Onset  . Tuberculosis Mother   . Nephrolithiasis Brother       Review of Systems  Constitutional: Negative for chills, diaphoresis and fatigue.  HENT: Negative for ear pain, postnasal drip and sinus pressure.   Eyes: Negative for photophobia, discharge, redness, itching and visual  disturbance.  Respiratory: Negative for cough, shortness of breath and wheezing.   Cardiovascular: Negative for chest pain, palpitations and leg swelling.  Gastrointestinal: Negative for abdominal pain, constipation, diarrhea, nausea and vomiting.  Genitourinary: Negative for dysuria and flank pain.  Musculoskeletal: Negative for arthralgias, back pain, gait problem and neck pain.  Skin: Negative for color change.  Allergic/Immunologic: Negative for environmental allergies and food allergies.  Neurological: Negative for dizziness and headaches.  Hematological: Does not bruise/bleed easily.  Psychiatric/Behavioral: Negative for agitation, behavioral problems (depression) and hallucinations.     Vital Signs: BP 132/84   Pulse 68   Temp (!) 97.4 F (36.3 C)   Resp 16   Ht 4' 7" (1.397 m)   Wt 186 lb (84.4 kg)   SpO2 99%   BMI 43.23 kg/m    Physical Exam Vitals reviewed.  Constitutional:      Appearance: Normal appearance. She is obese.  Cardiovascular:     Rate and Rhythm: Normal rate and regular rhythm.     Pulses: Normal pulses.     Heart sounds: Normal heart sounds.  Pulmonary:     Effort: Pulmonary effort is normal.     Breath sounds: Normal breath sounds.  Chest:  Breasts:     Right: Normal.     Left: Normal.      Comments: Intertriginous dermatitis under bilateral breasts Abdominal:     General: Abdomen is flat.     Palpations: Abdomen is soft.  Musculoskeletal:        General: Normal range of motion.     Cervical back: Normal range of motion.  Skin:    General: Skin is warm.  Neurological:     General: No focal deficit present.     Mental Status: She is alert and oriented to person, place, and time. Mental status is at baseline.  Psychiatric:        Mood and Affect: Mood normal.        Behavior: Behavior normal.        Thought Content: Thought content normal.        Judgment: Judgment normal.      LABS: Recent Results (from the past 2160 hour(s))   POCT Urinalysis Dipstick     Status: Abnormal   Collection Time: 09/14/20 11:47 AM  Result Value Ref Range   Color, UA     Clarity, UA     Glucose, UA Negative Negative   Bilirubin, UA small    Ketones, UA negative    Spec Grav, UA 1.025 1.010 - 1.025   Blood, UA negative    pH, UA 5.0 5.0 - 8.0   Protein, UA Positive (A) Negative   Urobilinogen, UA 0.2 0.2 or 1.0 E.U./dL   Nitrite, UA negative    Leukocytes, UA Small (1+) (A) Negative   Appearance  Odor    CULTURE, URINE COMPREHENSIVE     Status: None   Collection Time: 09/14/20 12:02 PM   Specimen: Urine   Urine  Result Value Ref Range   Urine Culture, Comprehensive Final report    Organism ID, Bacteria Comment     Comment: Mixed urogenital flora 25,000-50,000 colony forming units per mL   Comprehensive metabolic panel     Status: Abnormal   Collection Time: 11/20/20 11:54 AM  Result Value Ref Range   Glucose 107 (H) 65 - 99 mg/dL   BUN 15 8 - 27 mg/dL   Creatinine, Ser 0.86 0.57 - 1.00 mg/dL   GFR calc non Af Amer 67 >59 mL/min/1.73   GFR calc Af Amer 78 >59 mL/min/1.73    Comment: **In accordance with recommendations from the NKF-ASN Task force,**   [facility] is in the process of updating its eGFR calculation to the   2021 CKD-EPI creatinine equation that estimates kidney function   without a race variable.    BUN/Creatinine Ratio 17 12 - 28   Sodium 141 134 - 144 mmol/L   Potassium 3.8 3.5 - 5.2 mmol/L   Chloride 101 96 - 106 mmol/L   CO2 22 20 - 29 mmol/L   Calcium 9.7 8.7 - 10.3 mg/dL   Total Protein 7.9 6.0 - 8.5 g/dL   Albumin 4.3 3.7 - 4.7 g/dL   Globulin, Total 3.6 1.5 - 4.5 g/dL   Albumin/Globulin Ratio 1.2 1.2 - 2.2   Bilirubin Total 0.3 0.0 - 1.2 mg/dL   Alkaline Phosphatase 128 (H) 44 - 121 IU/L   AST 17 0 - 40 IU/L   ALT 13 0 - 32 IU/L  CBC     Status: None   Collection Time: 11/20/20 11:54 AM  Result Value Ref Range   WBC 6.1 3.4 - 10.8 x10E3/uL   RBC 4.67 3.77 - 5.28 x10E6/uL    Hemoglobin 14.5 11.1 - 15.9 g/dL   Hematocrit 42.2 34.0 - 46.6 %   MCV 90 79 - 97 fL   MCH 31.0 26.6 - 33.0 pg   MCHC 34.4 31.5 - 35.7 g/dL   RDW 13.2 11.7 - 15.4 %   Platelets 203 150 - 450 x10E3/uL  Lipid Panel With LDL/HDL Ratio     Status: Abnormal   Collection Time: 11/20/20 11:54 AM  Result Value Ref Range   Cholesterol, Total 189 100 - 199 mg/dL   Triglycerides 113 0 - 149 mg/dL   HDL 38 (L) >39 mg/dL   VLDL Cholesterol Cal 21 5 - 40 mg/dL   LDL Chol Calc (NIH) 130 (H) 0 - 99 mg/dL   LDL/HDL Ratio 3.4 (H) 0.0 - 3.2 ratio    Comment:                                     LDL/HDL Ratio                                             Men  Women                               1/2 Avg.Risk  1.0    1.5  Avg.Risk  3.6    3.2                                2X Avg.Risk  6.2    5.0                                3X Avg.Risk  8.0    6.1   Hgb A1c w/o eAG     Status: Abnormal   Collection Time: 11/20/20 11:54 AM  Result Value Ref Range   Hgb A1c MFr Bld 5.7 (H) 4.8 - 5.6 %    Comment:          Prediabetes: 5.7 - 6.4          Diabetes: >6.4          Glycemic control for adults with diabetes: <7.0   T4, free     Status: None   Collection Time: 11/20/20 11:54 AM  Result Value Ref Range   Free T4 0.97 0.82 - 1.77 ng/dL  TSH     Status: None   Collection Time: 11/20/20 11:54 AM  Result Value Ref Range   TSH 2.630 0.450 - 4.500 uIU/mL  VITAMIN D 25 Hydroxy (Vit-D Deficiency, Fractures)     Status: Abnormal   Collection Time: 11/20/20 11:54 AM  Result Value Ref Range   Vit D, 25-Hydroxy 29.4 (L) 30.0 - 100.0 ng/mL    Comment: Vitamin D deficiency has been defined by the [place] and an Endocrine Society practice guideline as a level of serum 25-OH vitamin D less than 20 ng/mL (1,2). The Endocrine Society went on to further define vitamin D insufficiency as a level between 21 and 29 ng/mL (2). 1. IOM (Institute of Medicine). 2010. Dietary  reference    intakes for calcium and D. [place]: The    [employer]. 2. Holick MF, Binkley [place], Bischoff-Ferrari HA, et al.    Evaluation, treatment, and prevention of vitamin D    deficiency: an Endocrine Society clinical practice    guideline. JCEM. 2011 Jul; 96(7):1911-30.   UA/M w/rflx Culture, Routine     Status: Abnormal (Preliminary result)   Collection Time: 11/30/20 11:30 AM   Specimen: Urine   Urine  Result Value Ref Range   Specific Gravity, UA 1.022 1.005 - 1.030   pH, UA 5.0 5.0 - 7.5   Color, UA Yellow Yellow   Appearance Ur Clear Clear   Leukocytes,UA Trace (A) Negative   Protein,UA Trace Negative/Trace   Glucose, UA Negative Negative   Ketones, UA Negative Negative   RBC, UA Negative Negative   Bilirubin, UA Negative Negative   Urobilinogen, Ur 0.2 0.2 - 1.0 mg/dL   Nitrite, UA Negative Negative   Microscopic Examination See below:     Comment: Microscopic was indicated and was performed.   Urinalysis Reflex Comment     Comment: This specimen has reflexed to a Urine Culture.  Microscopic Examination     Status: None   Collection Time: 11/30/20 11:30 AM   Urine  Result Value Ref Range   WBC, UA 0-5 0 - 5 /hpf   RBC 0-2 0 - 2 /hpf   Epithelial Cells (non renal) 0-10 0 - 10 /hpf   Casts None seen None seen /lpf   Bacteria, UA None seen None seen/Few  Urine Culture, Reflex  Status: None (Preliminary result)   Collection Time: 11/30/20 11:30 AM   Urine  Result Value Ref Range   Urine Culture, Routine WILL FOLLOW   POCT Urinalysis Dipstick     Status: Abnormal   Collection Time: 11/30/20 12:13 PM  Result Value Ref Range   Color, UA     Clarity, UA     Glucose, UA Negative Negative   Bilirubin, UA neg    Ketones, UA neg    Spec Grav, UA 1.025 1.010 - 1.025   Blood, UA neg    pH, UA 5.0 5.0 - 8.0   Protein, UA Positive (A) Negative    Comment: trace   Urobilinogen, UA 0.2 0.2 or 1.0 E.U./dL   Nitrite, UA neg    Leukocytes, UA  Negative Negative   Appearance     Odor      Assessment/Plan: 1. Encounter for routine adult health examination with abnormal findings Well appearing 74 year old female Orders placed to update PHM Mammogram to be due in Summer, will send for BMD at that time Reviewed recent routine fasting labs  2. Diet-controlled diabetes mellitus (HCC) A1C on recent labs 5.7--closely monitor Low threshold for starting medications due to multiple comorbid conditions  3. Essential hypertension BP and HR well controlled, requesting refills - amLODipine (NORVASC) 2.5 MG tablet; Take 2 tablets (5 mg total) by mouth daily.  Dispense: 180 tablet; Refill: 1  4. Mixed hyperlipidemia Start low dose statin Will need further vascular studies - rosuvastatin (CRESTOR) 5 MG tablet; Take 1 tablet (5 mg total) by mouth daily.  Dispense: 90 tablet; Refill: 3  5. Screening for colon cancer - Ambulatory referral to Gastroenterology  6. Intertriginous dermatitis associated with moisture Apply nystatin under bilateral breasts Keep area dry and clean, do not rub dry but pat gently to dry after bathing - nystatin (MYCOSTATIN/NYSTOP) powder; Apply 1 application topically 3 (three) times daily.  Dispense: 60 g; Refill: 5  7. Morbid obesity (HCC) BMI 43 Obesity Counseling: Risk Assessment: An assessment of behavioral risk factors was made today and includes lack of exercise sedentary lifestyle, lack of portion control and poor dietary habits.  Risk Modification Advice: She was counseled on portion control guidelines. Restricting daily caloric intake to 1800. The detrimental long term effects of obesity on her health and ongoing poor compliance was also discussed with the patient.  8. Dysuria - UA/M w/rflx Culture, Routine - POCT Urinalysis Dipstick - Microscopic Examination - Urine Culture, Reflex  General Counseling: Adiel verbalizes understanding of the findings of todays visit and agrees with plan of  treatment. I have discussed any further diagnostic evaluation that may be needed or ordered today. We also reviewed her medications today. she has been encouraged to call the office with any questions or concerns that should arise related to todays visit.    Counseling:    Orders Placed This Encounter  Procedures  . Microscopic Examination  . Urine Culture, Reflex  . UA/M w/rflx Culture, Routine  . Ambulatory referral to Gastroenterology  . POCT Urinalysis Dipstick    Meds ordered this encounter  Medications  . rosuvastatin (CRESTOR) 5 MG tablet    Sig: Take 1 tablet (5 mg total) by mouth daily.    Dispense:  90 tablet    Refill:  3  . amLODipine (NORVASC) 2.5 MG tablet    Sig: Take 2 tablets (5 mg total) by mouth daily.    Dispense:  180 tablet    Refill:  1      Please note increased dose. Patient does not need new prescription yet.  . nystatin (MYCOSTATIN/NYSTOP) powder    Sig: Apply 1 application topically 3 (three) times daily.    Dispense:  60 g    Refill:  5    Total time spent: 40 Minutes  Time spent includes review of chart, medications, test results, and follow up plan with the patient.   This patient was seen by Theodoro Grist AGNP-C Collaboration with Dr Lavera Guise as a part of collaborative care agreement   Tanna Furry. [facility] Internal Medicine

## 2020-12-02 ENCOUNTER — Encounter: Payer: Self-pay | Admitting: Hospice and Palliative Medicine

## 2020-12-03 LAB — MICROSCOPIC EXAMINATION
Bacteria, UA: NONE SEEN
Casts: NONE SEEN /lpf

## 2020-12-03 LAB — URINE CULTURE, REFLEX

## 2020-12-03 LAB — UA/M W/RFLX CULTURE, ROUTINE
Bilirubin, UA: NEGATIVE
Glucose, UA: NEGATIVE
Ketones, UA: NEGATIVE
Nitrite, UA: NEGATIVE
RBC, UA: NEGATIVE
Specific Gravity, UA: 1.022 (ref 1.005–1.030)
Urobilinogen, Ur: 0.2 mg/dL (ref 0.2–1.0)
pH, UA: 5 (ref 5.0–7.5)

## 2020-12-04 DIAGNOSIS — J301 Allergic rhinitis due to pollen: Secondary | ICD-10-CM | POA: Diagnosis not present

## 2020-12-04 DIAGNOSIS — J3089 Other allergic rhinitis: Secondary | ICD-10-CM | POA: Diagnosis not present

## 2020-12-04 DIAGNOSIS — J3081 Allergic rhinitis due to animal (cat) (dog) hair and dander: Secondary | ICD-10-CM | POA: Diagnosis not present

## 2020-12-06 ENCOUNTER — Ambulatory Visit (INDEPENDENT_AMBULATORY_CARE_PROVIDER_SITE_OTHER): Payer: Medicare Other | Admitting: Hospice and Palliative Medicine

## 2020-12-06 ENCOUNTER — Encounter: Payer: Self-pay | Admitting: Hospice and Palliative Medicine

## 2020-12-06 VITALS — BP 138/84 | HR 65 | Temp 98.1°F | Resp 16 | Ht <= 58 in | Wt 184.2 lb

## 2020-12-06 DIAGNOSIS — Z7189 Other specified counseling: Secondary | ICD-10-CM

## 2020-12-06 DIAGNOSIS — G4733 Obstructive sleep apnea (adult) (pediatric): Secondary | ICD-10-CM | POA: Diagnosis not present

## 2020-12-06 DIAGNOSIS — Z9989 Dependence on other enabling machines and devices: Secondary | ICD-10-CM

## 2020-12-06 NOTE — Progress Notes (Signed)
[facility] [place], [place] [address]  Pulmonary Sleep Medicine   Office Visit Note  Patient Name: Claudia Martin DOB: Jan 30, 1946 MRN [id number]  Date of Service: 12/07/2020  Complaints/HPI: Patient is here for routine pulmonary follow-up Followed for OSA on CPAP--eligible for a new machine Reports nightly compliance with CPAP--sleeps well without complications Averaging 7-8 hours of sleep per night Cleans machine by hand once weekly, changes tubing and filters as directed Last download 09/2020 98% compliance Denies further issues with her breathing, negative smoking history, no known environmental allergies  ROS  General: (-) fever, (-) chills, (-) night sweats, (-) weakness Skin: (-) rashes, (-) itching,. Eyes: (-) visual changes, (-) redness, (-) itching. Nose and Sinuses: (-) nasal stuffiness or itchiness, (-) postnasal drip, (-) nosebleeds, (-) sinus trouble. Mouth and Throat: (-) sore throat, (-) hoarseness. Neck: (-) swollen glands, (-) enlarged thyroid, (-) neck pain. Respiratory: - cough, (-) bloody sputum, - shortness of breath, - wheezing. Cardiovascular: - ankle swelling, (-) chest pain. Lymphatic: (-) lymph node enlargement. Neurologic: (-) numbness, (-) tingling. Psychiatric: (-) anxiety, (-) depression   Current Medication: Outpatient Encounter Medications as of 12/06/2020  Medication Sig Note  . amLODipine (NORVASC) 2.5 MG tablet Take 2 tablets (5 mg total) by mouth daily.   Marland Kitchen atenolol (TENORMIN) 25 MG tablet takes 1 and 1/2 tablets twice a day.   . Blood Glucose Monitoring Suppl (ACCU-CHEK AVIVA PLUS) w/Device KIT Use as directed   . cetirizine (ZYRTEC) 10 MG tablet Take 10 mg by mouth daily.   . chlorhexidine (PERIDEX) 0.12 % solution Use as directed 5 mLs in the mouth or throat 2 (two) times daily.   Marland Kitchen conjugated estrogens (PREMARIN) vaginal cream Use 1 applicatorful twice weekly.   Marland Kitchen EPINEPHrine 0.3 mg/0.3 mL IJ SOAJ  injection as needed. 07/16/2015: Received from: [facility]  . ergocalciferol (DRISDOL) 1.25 MG (50000 UT) capsule Take 1 capsule (50,000 Units total) by mouth once a week.   . levothyroxine (SYNTHROID) 50 MCG tablet Take 1 tablet (50 mcg total) by mouth daily before breakfast.   . nystatin (MYCOSTATIN/NYSTOP) powder Apply 1 application topically 3 (three) times daily.   Marland Kitchen olopatadine (PATANOL) 0.1 % ophthalmic solution instill 1 drop into both eyes twice a day for ITCHY EYES 07/16/2015: Received from: External Pharmacy  . Olopatadine HCl 0.2 % SOLN Place 1 drop into both eyes 2 (two) times daily.   . rosuvastatin (CRESTOR) 5 MG tablet Take 1 tablet (5 mg total) by mouth daily.   . valACYclovir (VALTREX) 1000 MG tablet Take 1 tablet (1,000 mg total) by mouth daily. Pt can 1 tablet twice a day if flare up for 7 days.    No facility-administered encounter medications on file as of 12/06/2020.    Surgical History: Past Surgical History:  Procedure Laterality Date  . BREAST BIOPSY    . COLON SURGERY    . COLONOSCOPY WITH PROPOFOL N/A 03/15/2018   Procedure: COLONOSCOPY WITH PROPOFOL;  Surgeon: Lin Landsman, MD;  Location: [facility] ENDOSCOPY;  Service: Gastroenterology;  Laterality: N/A;  . diverticulitis    . ECTOPIC PREGNANCY SURGERY    . fx thumb    . HERNIA REPAIR      Medical History: Past Medical History:  Diagnosis Date  . Abdominal pain   . Allergic rhinitis   . Candidiasis of vulva and vagina   . Cellulitis and abscess   . Diverticulosis   . Dysuria   . Eyelid inflammation   .  Hematuria syndrome   . Hemorrhoids   . Herpes genitalis   . Herpes simplex   . HTN (hypertension)   . Hydronephrosis   . Hypothyroidism   . Incomplete bladder emptying   . Internal hordeolum   . Knee pain   . Otalgia   . Primary ovarian failure   . Sciatica   . Shortness of breath   . Urethral diverticulum   . UTI (lower urinary tract infection)   . Vaginal atrophy   .  Vaginitis     Family History: Family History  Problem Relation Age of Onset  . Tuberculosis Mother   . Nephrolithiasis Brother     Social History: Social History   Socioeconomic History  . Marital status: Divorced    Spouse name: Not on file  . Number of children: Not on file  . Years of education: Not on file  . Highest education level: Not on file  Occupational History  . Not on file  Tobacco Use  . Smoking status: Never Smoker  . Smokeless tobacco: Never Used  Vaping Use  . Vaping Use: Never used  Substance and Sexual Activity  . Alcohol use: No  . Drug use: Never  . Sexual activity: Not on file  Other Topics Concern  . Not on file  Social History Narrative  . Not on file   Social Determinants of Health   Financial Resource Strain: Not on file  Food Insecurity: Not on file  Transportation Needs: Not on file  Physical Activity: Not on file  Stress: Not on file  Social Connections: Not on file  Intimate Partner Violence: Not on file    Vital Signs: Blood pressure 138/84, pulse 65, temperature 98.1 F (36.7 C), resp. rate 16, height $RemoveBe'4\' 7"'[id number]$  (1.397 m), weight 184 lb 3.2 oz (83.6 kg), SpO2 97 %.  Examination: General Appearance: The patient is well-developed, well-nourished, and in no distress. Skin: Gross inspection of skin unremarkable. Head: normocephalic, no gross deformities. Eyes: no gross deformities noted. ENT: ears appear grossly normal no exudates. Neck: Supple. No thyromegaly. No LAD. Respiratory: Clear throughout, no rhonchi, wheeze or rales noted. Cardiovascular: Normal S1 and S2 without murmur or rub. Extremities: No cyanosis. pulses are equal. Neurologic: Alert and oriented. No involuntary movements.  LABS: Recent Results (from the past 2160 hour(s))  POCT Urinalysis Dipstick     Status: Abnormal   Collection Time: 09/14/20 11:47 AM  Result Value Ref Range   Color, UA     Clarity, UA     Glucose, UA Negative Negative   Bilirubin, UA  small    Ketones, UA negative    Spec Grav, UA 1.025 1.010 - 1.025   Blood, UA negative    pH, UA 5.0 5.0 - 8.0   Protein, UA Positive (A) Negative   Urobilinogen, UA 0.2 0.2 or 1.0 E.U./dL   Nitrite, UA negative    Leukocytes, UA Small (1+) (A) Negative   Appearance     Odor    CULTURE, URINE COMPREHENSIVE     Status: None   Collection Time: 09/14/20 12:02 PM   Specimen: Urine   Urine  Result Value Ref Range   Urine Culture, Comprehensive Final report    Organism ID, Bacteria Comment     Comment: Mixed urogenital flora 25,000-50,000 colony forming units per mL   Comprehensive metabolic panel     Status: Abnormal   Collection Time: 11/20/20 11:54 AM  Result Value Ref Range   Glucose 107 (H) 65 -  99 mg/dL   BUN 15 8 - 27 mg/dL   Creatinine, Ser 0.86 0.57 - 1.00 mg/dL   GFR calc non Af Amer 67 >59 mL/min/1.73   GFR calc Af Amer 78 >59 mL/min/1.73    Comment: **In accordance with recommendations from the NKF-ASN Task force,**   [facility] is in the process of updating its eGFR calculation to the   2021 CKD-EPI creatinine equation that estimates kidney function   without a race variable.    BUN/Creatinine Ratio 17 12 - 28   Sodium 141 134 - 144 mmol/L   Potassium 3.8 3.5 - 5.2 mmol/L   Chloride 101 96 - 106 mmol/L   CO2 22 20 - 29 mmol/L   Calcium 9.7 8.7 - 10.3 mg/dL   Total Protein 7.9 6.0 - 8.5 g/dL   Albumin 4.3 3.7 - 4.7 g/dL   Globulin, Total 3.6 1.5 - 4.5 g/dL   Albumin/Globulin Ratio 1.2 1.2 - 2.2   Bilirubin Total 0.3 0.0 - 1.2 mg/dL   Alkaline Phosphatase 128 (H) 44 - 121 IU/L   AST 17 0 - 40 IU/L   ALT 13 0 - 32 IU/L  CBC     Status: None   Collection Time: 11/20/20 11:54 AM  Result Value Ref Range   WBC 6.1 3.4 - 10.8 x10E3/uL   RBC 4.67 3.77 - 5.28 x10E6/uL   Hemoglobin 14.5 11.1 - 15.9 g/dL   Hematocrit 42.2 34.0 - 46.6 %   MCV 90 79 - 97 fL   MCH 31.0 26.6 - 33.0 pg   MCHC 34.4 31.5 - 35.7 g/dL   RDW 13.2 11.7 - 15.4 %   Platelets 203 150 - 450  x10E3/uL  Lipid Panel With LDL/HDL Ratio     Status: Abnormal   Collection Time: 11/20/20 11:54 AM  Result Value Ref Range   Cholesterol, Total 189 100 - 199 mg/dL   Triglycerides 113 0 - 149 mg/dL   HDL 38 (L) >39 mg/dL   VLDL Cholesterol Cal 21 5 - 40 mg/dL   LDL Chol Calc (NIH) 130 (H) 0 - 99 mg/dL   LDL/HDL Ratio 3.4 (H) 0.0 - 3.2 ratio    Comment:                                     LDL/HDL Ratio                                             Men  Women                               1/2 Avg.Risk  1.0    1.5                                   Avg.Risk  3.6    3.2                                2X Avg.Risk  6.2    5.0  3X Avg.Risk  8.0    6.1   Hgb A1c w/o eAG     Status: Abnormal   Collection Time: 11/20/20 11:54 AM  Result Value Ref Range   Hgb A1c MFr Bld 5.7 (H) 4.8 - 5.6 %    Comment:          Prediabetes: 5.7 - 6.4          Diabetes: >6.4          Glycemic control for adults with diabetes: <7.0   T4, free     Status: None   Collection Time: 11/20/20 11:54 AM  Result Value Ref Range   Free T4 0.97 0.82 - 1.77 ng/dL  TSH     Status: None   Collection Time: 11/20/20 11:54 AM  Result Value Ref Range   TSH 2.630 0.450 - 4.500 uIU/mL  VITAMIN D 25 Hydroxy (Vit-D Deficiency, Fractures)     Status: Abnormal   Collection Time: 11/20/20 11:54 AM  Result Value Ref Range   Vit D, 25-Hydroxy 29.4 (L) 30.0 - 100.0 ng/mL    Comment: Vitamin D deficiency has been defined by the [place] and an Endocrine Society practice guideline as a level of serum 25-OH vitamin D less than 20 ng/mL (1,2). The Endocrine Society went on to further define vitamin D insufficiency as a level between 21 and 29 ng/mL (2). 1. IOM (Institute of Medicine). 2010. Dietary reference    intakes for calcium and D. [place]: The    [employer]. 2. Holick MF, Binkley , Bischoff-Ferrari HA, et al.    Evaluation, treatment, and prevention of vitamin D     deficiency: an Endocrine Society clinical practice    guideline. JCEM. 2011 Jul; 96(7):1911-30.   UA/M w/rflx Culture, Routine     Status: Abnormal   Collection Time: 11/30/20 11:30 AM   Specimen: Urine   Urine  Result Value Ref Range   Specific Gravity, UA 1.022 1.005 - 1.030   pH, UA 5.0 5.0 - 7.5   Color, UA Yellow Yellow   Appearance Ur Clear Clear   Leukocytes,UA Trace (A) Negative   Protein,UA Trace Negative/Trace   Glucose, UA Negative Negative   Ketones, UA Negative Negative   RBC, UA Negative Negative   Bilirubin, UA Negative Negative   Urobilinogen, Ur 0.2 0.2 - 1.0 mg/dL   Nitrite, UA Negative Negative   Microscopic Examination See below:     Comment: Microscopic was indicated and was performed.   Urinalysis Reflex Comment     Comment: This specimen has reflexed to a Urine Culture.  Microscopic Examination     Status: None   Collection Time: 11/30/20 11:30 AM   Urine  Result Value Ref Range   WBC, UA 0-5 0 - 5 /hpf   RBC 0-2 0 - 2 /hpf   Epithelial Cells (non renal) 0-10 0 - 10 /hpf   Casts None seen None seen /lpf   Bacteria, UA None seen None seen/Few  Urine Culture, Reflex     Status: None   Collection Time: 11/30/20 11:30 AM   Urine  Result Value Ref Range   Urine Culture, Routine Final report    Organism ID, Bacteria Comment     Comment: Mixed urogenital flora Less than 10,000 colonies/mL   POCT Urinalysis Dipstick     Status: Abnormal   Collection Time: 11/30/20 12:13 PM  Result Value Ref Range   Color, UA     Clarity, UA  Glucose, UA Negative Negative   Bilirubin, UA neg    Ketones, UA neg    Spec Grav, UA 1.025 1.010 - 1.025   Blood, UA neg    pH, UA 5.0 5.0 - 8.0   Protein, UA Positive (A) Negative    Comment: trace   Urobilinogen, UA 0.2 0.2 or 1.0 E.U./dL   Nitrite, UA neg    Leukocytes, UA Negative Negative   Appearance     Odor      Radiology: No results found.  No results found.  No results found.    Assessment and  Plan: Patient Active Problem List   Diagnosis Date Noted  . Urinary tract infection without hematuria 01/04/2020  . Candidiasis of skin 01/04/2020  . Ventral hernia without obstruction or gangrene 11/28/2019  . Herpes simplex infection 10/27/2019  . Stomatitis and mucositis 10/27/2019  . Encounter for general adult medical examination with abnormal findings 10/27/2019  . Vitamin B12 deficiency 06/30/2018  . Unspecified menopausal and perimenopausal disorder 06/30/2018  . Vitamin D deficiency 06/30/2018  . Flu vaccine need 06/30/2018  . Urinary tract infection with hematuria 02/05/2018  . Acquired hypothyroidism 02/05/2018  . Essential hypertension 02/05/2018  . Screening for breast cancer 02/05/2018  . Dysuria 02/05/2018  . Obstructive sleep apnea (adult) (pediatric) 01/06/2018    1. OSA on CPAP Encouraged to continue with nightly compliance with CPAP Eligible for a new machine as her current machine is greater than 58 years old Orders placed - For home use only DME continuous positive airway pressure (CPAP)  2. CPAP use counseling Pt reports good compliance with CPAP therapy. Cleaning machine by hand, and changing filters and tubing as directed. Denies headaches, sinus issues, palpitations, or hemoptysis.   Discussed importance of adequate CPAP use as well as proper care and cleaning techniques of machine and all supplies.  3. Morbid obesity ([place]) BMI 42 Obesity Counseling: Risk Assessment: An assessment of behavioral risk factors was made today and includes lack of exercise sedentary lifestyle, lack of portion control and poor dietary habits.  Risk Modification Advice: She was counseled on portion control guidelines. Restricting daily caloric intake to 1800. The detrimental long term effects of obesity on her health and ongoing poor compliance was also discussed with the patient.  General Counseling: I have discussed the findings of the evaluation and examination with Verdis Frederickson.  I  have also discussed any further diagnostic evaluation thatmay be needed or ordered today. Genessa verbalizes understanding of the findings of todays visit. We also reviewed her medications today and discussed drug interactions and side effects including but not limited excessive drowsiness and altered mental states. We also discussed that there is always a risk not just to her but also people around her. she has been encouraged to call the office with any questions or concerns that should arise related to todays visit.  Orders Placed This Encounter  Procedures  . For home use only DME continuous positive airway pressure (CPAP)    Continue with current settings--eligible for a new machine    Order Specific Question:   Length of Need    Answer:   Lifetime    Order Specific Question:   Patient has OSA or probable OSA    Answer:   Yes    Order Specific Question:   Settings    Answer:   Other see comments    Order Specific Question:   CPAP supplies needed    Answer:   Mask, headgear, cushions, filters, heated tubing  and water chamber     Time spent: 25  I have personally obtained a history, examined the patient, evaluated laboratory and imaging results, formulated the assessment and plan and placed orders. This patient was seen by Theodoro Grist, AGNP-C in collaboration with Dr. Devona Konig as a part of collaborative care agreement.     Allyne Gee, MD [facility] Pulmonary and Critical Care Sleep medicine

## 2020-12-07 ENCOUNTER — Encounter: Payer: Self-pay | Admitting: Hospice and Palliative Medicine

## 2020-12-07 NOTE — Patient Instructions (Signed)

## 2020-12-10 ENCOUNTER — Telehealth: Payer: Self-pay

## 2020-12-10 ENCOUNTER — Other Ambulatory Visit: Payer: Self-pay | Admitting: Nurse Practitioner

## 2020-12-10 DIAGNOSIS — I10 Essential (primary) hypertension: Secondary | ICD-10-CM

## 2020-12-10 NOTE — Telephone Encounter (Signed)
Gave [facility] orders for new cpap. Claudia Martin

## 2020-12-11 DIAGNOSIS — J301 Allergic rhinitis due to pollen: Secondary | ICD-10-CM | POA: Diagnosis not present

## 2020-12-11 DIAGNOSIS — J3081 Allergic rhinitis due to animal (cat) (dog) hair and dander: Secondary | ICD-10-CM | POA: Diagnosis not present

## 2020-12-11 DIAGNOSIS — J3089 Other allergic rhinitis: Secondary | ICD-10-CM | POA: Diagnosis not present

## 2020-12-17 ENCOUNTER — Other Ambulatory Visit: Payer: Self-pay

## 2020-12-17 ENCOUNTER — Telehealth (INDEPENDENT_AMBULATORY_CARE_PROVIDER_SITE_OTHER): Payer: Self-pay | Admitting: Gastroenterology

## 2020-12-17 DIAGNOSIS — Z860100 Personal history of colon polyps, unspecified: Secondary | ICD-10-CM

## 2020-12-17 DIAGNOSIS — I10 Essential (primary) hypertension: Secondary | ICD-10-CM

## 2020-12-17 DIAGNOSIS — K121 Other forms of stomatitis: Secondary | ICD-10-CM

## 2020-12-17 MED ORDER — CHLORHEXIDINE GLUCONATE 0.12 % MT SOLN: 5.0000 mL | Freq: Two times a day (BID) | OROMUCOSAL | 1 refills | Status: DC

## 2020-12-17 MED ORDER — NA SULFATE-K SULFATE-MG SULF 17.5-3.13-1.6 GM/177ML PO SOLN: 1.0000 | Freq: Once | ORAL | 0 refills | Status: AC

## 2020-12-17 MED ORDER — AMLODIPINE BESYLATE 2.5 MG PO TABS: 5.0000 mg | ORAL_TABLET | Freq: Every day | ORAL | 1 refills | Status: DC

## 2020-12-17 NOTE — Progress Notes (Signed)
Gastroenterology Pre-Procedure Review  Request Date: Wed 01/09/21 Requesting Physician: Dr. Marius Ditch  PATIENT REVIEW QUESTIONS: The patient responded to the following health history questions as indicated:    1. Are you having any GI issues? no 2. Do you have a personal history of Polyps? yes (03/15/18 performed by Dr. Marius Ditch) 3. Do you have a family history of Colon Cancer or Polyps? yes (brother colon cancer dx last year) 50. Diabetes Mellitus? no 5. Joint replacements in the past 12 months?no 6. Major health problems in the past 3 months?no 7. Any artificial heart valves, MVP, or defibrillator?no    MEDICATIONS & ALLERGIES:    Patient reports the following regarding taking any anticoagulation/antiplatelet therapy:   Plavix, Coumadin, Eliquis, Xarelto, Lovenox, Pradaxa, Brilinta, or Effient? no Aspirin? no  Patient confirms/reports the following medications:  Current Outpatient Medications  Medication Sig Dispense Refill  . amLODipine (NORVASC) 2.5 MG tablet Take 2 tablets (5 mg total) by mouth daily. 180 tablet 1  . atenolol (TENORMIN) 25 MG tablet takes 1 and 1/2 tablets twice a day. 180 tablet 1  . Blood Glucose Monitoring Suppl (ACCU-CHEK AVIVA PLUS) w/Device KIT Use as directed 1 kit 0  . cetirizine (ZYRTEC) 10 MG tablet Take 10 mg by mouth daily.    . chlorhexidine (PERIDEX) 0.12 % solution Use as directed 5 mLs in the mouth or throat 2 (two) times daily. 473 mL 0  . conjugated estrogens (PREMARIN) vaginal cream Use 1 applicatorful twice weekly. 90 g 4  . EPINEPHrine 0.3 mg/0.3 mL IJ SOAJ injection as needed.    . ergocalciferol (DRISDOL) 1.25 MG (50000 UT) capsule Take 1 capsule (50,000 Units total) by mouth once a week. 4 capsule 3  . levothyroxine (SYNTHROID) 50 MCG tablet Take 1 tablet (50 mcg total) by mouth daily before breakfast. 90 tablet 1  . nystatin (MYCOSTATIN/NYSTOP) powder Apply 1 application topically 3 (three) times daily. 60 g 5  . olopatadine (PATANOL) 0.1 %  ophthalmic solution instill 1 drop into both eyes twice a day for ITCHY EYES  0  . Olopatadine HCl 0.2 % SOLN Place 1 drop into both eyes 2 (two) times daily.    . rosuvastatin (CRESTOR) 5 MG tablet Take 1 tablet (5 mg total) by mouth daily. 90 tablet 3  . valACYclovir (VALTREX) 1000 MG tablet Take 1 tablet (1,000 mg total) by mouth daily. Pt can 1 tablet twice a day if flare up for 7 days. 90 tablet 1   No current facility-administered medications for this visit.    Patient confirms/reports the following allergies:  Allergies  Allergen Reactions  . Other Diarrhea and Hives  . Augmentin [Amoxicillin-Pot Clavulanate]   . Bactrim [Sulfamethoxazole-Trimethoprim] Hives  . Ibuprofen     Other reaction(s): Unknown  . Ivp Dye [Iodinated Diagnostic Agents] Diarrhea and Hives  . Macrobid [Nitrofurantoin]   . Prednisone Nausea Only    Other reaction(s): Dizziness    No orders of the defined types were placed in this encounter.   AUTHORIZATION INFORMATION Primary Insurance: 1D#: Group #:  Secondary Insurance: 1D#: Group #:  SCHEDULE INFORMATION: Date: Wednesday 01/09/21 Time: Location:[facility]

## 2020-12-18 ENCOUNTER — Other Ambulatory Visit: Payer: Self-pay

## 2020-12-18 DIAGNOSIS — J3089 Other allergic rhinitis: Secondary | ICD-10-CM | POA: Diagnosis not present

## 2020-12-18 DIAGNOSIS — J3081 Allergic rhinitis due to animal (cat) (dog) hair and dander: Secondary | ICD-10-CM | POA: Diagnosis not present

## 2020-12-18 DIAGNOSIS — J301 Allergic rhinitis due to pollen: Secondary | ICD-10-CM | POA: Diagnosis not present

## 2020-12-25 DIAGNOSIS — J3081 Allergic rhinitis due to animal (cat) (dog) hair and dander: Secondary | ICD-10-CM | POA: Diagnosis not present

## 2020-12-25 DIAGNOSIS — J3089 Other allergic rhinitis: Secondary | ICD-10-CM | POA: Diagnosis not present

## 2020-12-25 DIAGNOSIS — J301 Allergic rhinitis due to pollen: Secondary | ICD-10-CM | POA: Diagnosis not present

## 2021-01-01 DIAGNOSIS — J3081 Allergic rhinitis due to animal (cat) (dog) hair and dander: Secondary | ICD-10-CM | POA: Diagnosis not present

## 2021-01-01 DIAGNOSIS — J301 Allergic rhinitis due to pollen: Secondary | ICD-10-CM | POA: Diagnosis not present

## 2021-01-01 DIAGNOSIS — J3089 Other allergic rhinitis: Secondary | ICD-10-CM | POA: Diagnosis not present

## 2021-01-02 DIAGNOSIS — G4733 Obstructive sleep apnea (adult) (pediatric): Secondary | ICD-10-CM | POA: Diagnosis not present

## 2021-01-07 ENCOUNTER — Other Ambulatory Visit: Payer: Self-pay

## 2021-01-07 ENCOUNTER — Other Ambulatory Visit
Admission: RE | Admit: 2021-01-07 | Discharge: 2021-01-07 | Disposition: A | Discharge status: Home / Self Care | Payer: Medicare Other | Source: Ambulatory Visit | Attending: Gastroenterology | Admitting: Gastroenterology

## 2021-01-07 DIAGNOSIS — Z20822 Contact with and (suspected) exposure to covid-19: Secondary | ICD-10-CM | POA: Insufficient documentation

## 2021-01-07 DIAGNOSIS — Z01812 Encounter for preprocedural laboratory examination: Secondary | ICD-10-CM | POA: Diagnosis not present

## 2021-01-07 LAB — SARS CORONAVIRUS 2 (TAT 6-24 HRS): SARS Coronavirus 2: NEGATIVE

## 2021-01-09 ENCOUNTER — Other Ambulatory Visit: Payer: Self-pay

## 2021-01-09 ENCOUNTER — Ambulatory Visit
Admission: RE | Admit: 2021-01-09 | Discharge: 2021-01-09 | Disposition: A | Discharge status: Home / Self Care | Payer: Medicare Other | Attending: Gastroenterology | Admitting: Gastroenterology

## 2021-01-09 ENCOUNTER — Encounter
Admission: RE | Disposition: A | Discharge status: Home / Self Care | Payer: Self-pay | Source: Home / Self Care | Attending: Gastroenterology

## 2021-01-09 ENCOUNTER — Ambulatory Visit: Payer: Medicare Other | Admitting: Certified Registered Nurse Anesthetist

## 2021-01-09 DIAGNOSIS — Z79890 Hormone replacement therapy (postmenopausal): Secondary | ICD-10-CM | POA: Insufficient documentation

## 2021-01-09 DIAGNOSIS — Z860100 Personal history of colon polyps, unspecified: Secondary | ICD-10-CM

## 2021-01-09 DIAGNOSIS — K5730 Diverticulosis of large intestine without perforation or abscess without bleeding: Secondary | ICD-10-CM | POA: Insufficient documentation

## 2021-01-09 DIAGNOSIS — Z79899 Other long term (current) drug therapy: Secondary | ICD-10-CM | POA: Diagnosis not present

## 2021-01-09 DIAGNOSIS — E039 Hypothyroidism, unspecified: Secondary | ICD-10-CM | POA: Diagnosis not present

## 2021-01-09 DIAGNOSIS — Z980 Intestinal bypass and anastomosis status: Secondary | ICD-10-CM | POA: Diagnosis not present

## 2021-01-09 DIAGNOSIS — Z886 Allergy status to analgesic agent status: Secondary | ICD-10-CM | POA: Diagnosis not present

## 2021-01-09 DIAGNOSIS — Z91041 Radiographic dye allergy status: Secondary | ICD-10-CM | POA: Diagnosis not present

## 2021-01-09 DIAGNOSIS — K5790 Diverticulosis of intestine, part unspecified, without perforation or abscess without bleeding: Secondary | ICD-10-CM | POA: Diagnosis not present

## 2021-01-09 DIAGNOSIS — K635 Polyp of colon: Secondary | ICD-10-CM | POA: Diagnosis not present

## 2021-01-09 DIAGNOSIS — I10 Essential (primary) hypertension: Secondary | ICD-10-CM | POA: Insufficient documentation

## 2021-01-09 DIAGNOSIS — Z1211 Encounter for screening for malignant neoplasm of colon: Secondary | ICD-10-CM | POA: Insufficient documentation

## 2021-01-09 DIAGNOSIS — Z8619 Personal history of other infectious and parasitic diseases: Secondary | ICD-10-CM | POA: Diagnosis not present

## 2021-01-09 DIAGNOSIS — K644 Residual hemorrhoidal skin tags: Secondary | ICD-10-CM | POA: Diagnosis not present

## 2021-01-09 DIAGNOSIS — K649 Unspecified hemorrhoids: Secondary | ICD-10-CM | POA: Diagnosis not present

## 2021-01-09 DIAGNOSIS — Z888 Allergy status to other drugs, medicaments and biological substances status: Secondary | ICD-10-CM | POA: Diagnosis not present

## 2021-01-09 DIAGNOSIS — D123 Benign neoplasm of transverse colon: Secondary | ICD-10-CM | POA: Insufficient documentation

## 2021-01-09 DIAGNOSIS — Z881 Allergy status to other antibiotic agents status: Secondary | ICD-10-CM | POA: Insufficient documentation

## 2021-01-09 HISTORY — PX: COLONOSCOPY WITH PROPOFOL: SHX5780

## 2021-01-09 SURGERY — COLONOSCOPY WITH PROPOFOL
Anesthesia: General

## 2021-01-09 MED ORDER — SODIUM CHLORIDE 0.9 % IV SOLN
INTRAVENOUS | Status: DC
Start: 2021-01-09 — End: 2021-01-09
  Administered 2021-01-09: 20 mL/h via INTRAVENOUS

## 2021-01-09 MED ORDER — LIDOCAINE HCL (CARDIAC) PF 100 MG/5ML IV SOSY
PREFILLED_SYRINGE | INTRAVENOUS | Status: DC | PRN
  Administered 2021-01-09: 50 mg via INTRAVENOUS

## 2021-01-09 MED ORDER — PROPOFOL 500 MG/50ML IV EMUL
INTRAVENOUS | Status: AC
  Filled 2021-01-09: qty 50

## 2021-01-09 MED ORDER — PROPOFOL 10 MG/ML IV BOLUS
INTRAVENOUS | Status: DC | PRN
  Administered 2021-01-09: 80 mg via INTRAVENOUS

## 2021-01-09 MED ORDER — LIDOCAINE HCL (PF) 2 % IJ SOLN
INTRAMUSCULAR | Status: AC
  Filled 2021-01-09: qty 5

## 2021-01-09 MED ORDER — PROPOFOL 500 MG/50ML IV EMUL
INTRAVENOUS | Status: DC | PRN
  Administered 2021-01-09: 150 ug/kg/min via INTRAVENOUS

## 2021-01-09 MED ORDER — SEVOFLURANE IN SOLN
RESPIRATORY_TRACT | Status: AC
  Filled 2021-01-09: qty 250

## 2021-01-09 NOTE — Transfer of Care (Signed)
Immediate Anesthesia Transfer of Care Note  Patient: Claudia Martin  Procedure(s) Performed: COLONOSCOPY WITH PROPOFOL (N/A )  Patient Location: PACU  Anesthesia Type:General  Level of Consciousness: sedated  Airway & Oxygen Therapy: Patient Spontanous Breathing  Post-op Assessment: Report given to RN and Post -op Vital signs reviewed and stable  Post vital signs: Reviewed and stable  Last Vitals:  Vitals Value Taken Time  BP 87/44 01/09/21 0909  Temp 36.2 C 01/09/21 0907  Pulse 74 01/09/21 0909  Resp 14 01/09/21 0909  SpO2 97 % 01/09/21 0909    Last Pain:  Vitals:   01/09/21 0907  TempSrc: Temporal  PainSc: Asleep         Complications: No complications documented.

## 2021-01-09 NOTE — Op Note (Signed)
[facility] Gastroenterology Patient Name: Claudia Martin [facility] Procedure Date: 01/09/2021 8:48 AM MRN: [id number] Account #: [id number] Date of Birth: 03-May-1946 Admit Type: Outpatient Age: 75 Room: [facility] ENDO ROOM 4 Gender: Female Note Status: Finalized Procedure:             Colonoscopy Indications:           Surveillance: Personal history of adenomatous polyps                         on last colonoscopy 3 years ago, Last colonoscopy:                         June 2019 Providers:             Lin Landsman MD, MD Medicines:             General Anesthesia Complications:         No immediate complications. Estimated blood loss: None. Procedure:             Pre-Anesthesia Assessment:                        - Prior to the procedure, a History and Physical was                         performed, and patient medications and allergies were                         reviewed. The patient is competent. The risks and                         benefits of the procedure and the sedation options and                         risks were discussed with the patient. All questions                         were answered and informed consent was obtained.                         Patient identification and proposed procedure were                         verified by the physician, the nurse, the                         anesthesiologist, the anesthetist and the technician                         in the pre-procedure area in the procedure room in the                         endoscopy suite. Mental Status Examination: alert and                         oriented. Airway Examination: normal oropharyngeal                         airway and neck mobility. Respiratory Examination:  clear to auscultation. CV Examination: normal.                         Prophylactic Antibiotics: The patient does not require                         prophylactic antibiotics. Prior  Anticoagulants: The                         patient has taken no previous anticoagulant or                         antiplatelet agents. ASA Grade Assessment: III - A                         patient with severe systemic disease. After reviewing                         the risks and benefits, the patient was deemed in                         satisfactory condition to undergo the procedure. The                         anesthesia plan was to use general anesthesia.                         Immediately prior to administration of medications,                         the patient was re-assessed for adequacy to receive                         sedatives. The heart rate, respiratory rate, oxygen                         saturations, blood pressure, adequacy of pulmonary                         ventilation, and response to care were monitored                         throughout the procedure. The physical status of the                         patient was re-assessed after the procedure.                        After obtaining informed consent, the colonoscope was                         passed under direct vision. Throughout the procedure,                         the patient's blood pressure, pulse, and oxygen                         saturations were monitored continuously. The  Colonoscope was introduced through the anus and                         advanced to the the terminal ileum, with                         identification of the appendiceal orifice and IC                         valve. The colonoscopy was performed without                         difficulty. The patient tolerated the procedure well.                         The quality of the bowel preparation was evaluated                         using the BBPS [facility] Bowel Preparation Scale) with                         scores of: Right Colon = 3, Transverse Colon = 3 and                         Left Colon = 3 (entire mucosa  seen well with no                         residual staining, small fragments of stool or opaque                         liquid). The total BBPS score equals 9. Findings:      The perianal and digital rectal examinations were normal. Pertinent       negatives include normal sphincter tone and no palpable rectal lesions.      The terminal ileum appeared normal.      There was evidence of a prior functional end-to-end colo-colonic       anastomosis in the sigmoid colon. This was patent and was characterized       by healthy appearing mucosa. The anastomosis was traversed.      A diminutive polyp was found in the transverse colon. The polyp was       sessile. The polyp was removed with a cold biopsy forceps. Resection and       retrieval were complete. Estimated blood loss: none.      Multiple diverticula were found in the sigmoid colon and descending       colon.      Non-bleeding external hemorrhoids were found during retroflexion. The       hemorrhoids were moderate. Impression:            - The examined portion of the ileum was normal.                        - Patent functional end-to-end colo-colonic                         anastomosis, characterized by healthy appearing mucosa.                        -  One diminutive polyp in the transverse colon,                         removed with a cold biopsy forceps. Resected and                         retrieved.                        - Diverticulosis in the sigmoid colon and in the                         descending colon.                        - Non-bleeding external hemorrhoids. Recommendation:        - Discharge patient to home (with escort).                        - Resume previous diet today.                        - Continue present medications.                        - Await pathology results.                        - Repeat colonoscopy in 5 years for surveillance. Procedure Code(s):     --- Professional ---                         [id number], Colonoscopy, flexible; with biopsy, single or                         multiple Diagnosis Code(s):     --- Professional ---                        Z86.010, Personal history of colonic polyps                        K64.4, Residual hemorrhoidal skin tags                        Z98.0, Intestinal bypass and anastomosis status                        K63.5, Polyp of colon                        K57.30, Diverticulosis of large intestine without                         perforation or abscess without bleeding CPT copyright 2019 American Medical Association. All rights reserved. The codes documented in this report are preliminary and upon coder review may  be revised to meet current compliance requirements. Dr. Ulyess Mort Lin Landsman MD, MD 01/09/2021 9:06:18 AM This report has been signed electronically. Number of Addenda: 0 Note Initiated On: 01/09/2021 8:48 AM Scope Withdrawal Time: 0 hours 6 minutes 22 seconds  Total Procedure Duration: 0 hours 9 minutes 13 seconds  Estimated  Blood Loss:  Estimated blood loss: none.      [facility]

## 2021-01-09 NOTE — H&P (Signed)
Claudia Darby, MD [address]  [place]  [place], [place] [address]  Main: [phone]  Fax: [phone] Pager: [phone]  Primary Care Physician:  Claudia Guise, MD Primary Gastroenterologist:  Dr. Cephas Martin  Pre-Procedure History & Physical: HPI:  Claudia Martin is a 75 y.o. female is here for an colonoscopy.   Past Medical History:  Diagnosis Date  . Abdominal pain   . Allergic rhinitis   . Candidiasis of vulva and vagina   . Cellulitis and abscess   . Diverticulosis   . Dysuria   . Eyelid inflammation   . Hematuria syndrome   . Hemorrhoids   . Herpes genitalis   . Herpes simplex   . HTN (hypertension)   . Hydronephrosis   . Hypothyroidism   . Incomplete bladder emptying   . Internal hordeolum   . Knee pain   . Otalgia   . Primary ovarian failure   . Sciatica   . Shortness of breath   . Urethral diverticulum   . UTI (lower urinary tract infection)   . Vaginal atrophy   . Vaginitis     Past Surgical History:  Procedure Laterality Date  . BREAST BIOPSY    . COLON SURGERY    . COLONOSCOPY WITH PROPOFOL N/A 03/15/2018   Procedure: COLONOSCOPY WITH PROPOFOL;  Surgeon: Claudia Landsman, MD;  Location: [facility] ENDOSCOPY;  Service: Gastroenterology;  Laterality: N/A;  . diverticulitis    . ECTOPIC PREGNANCY SURGERY    . fx thumb    . HERNIA REPAIR      Prior to Admission medications   Medication Sig Start Date End Date Taking? Authorizing Provider  amLODipine (NORVASC) 2.5 MG tablet Take 2 tablets (5 mg total) by mouth daily. 12/17/20  Yes Claudia Guise, MD  atenolol (TENORMIN) 25 MG tablet takes 1 and 1/2 tablets twice a day. 09/14/20  Yes Boscia, Greer Ee, NP  cetirizine (ZYRTEC) 10 MG tablet Take 10 mg by mouth daily.   Yes [provider]  chlorhexidine (PERIDEX) 0.12 % solution Use as directed 5 mLs in the mouth or throat 2 (two) times daily. 12/17/20  Yes Claudia Guise, MD  conjugated estrogens (PREMARIN) vaginal cream  Use 1 applicatorful twice weekly. 07/13/19  Yes Boscia, Heather E, NP  EPINEPHrine 0.3 mg/0.3 mL IJ SOAJ injection as needed. 08/24/14  Yes [provider]  ergocalciferol (DRISDOL) 1.25 MG (50000 UT) capsule Take 1 capsule (50,000 Units total) by mouth once a week. 03/01/20  Yes Ronnell Freshwater, NP  levothyroxine (SYNTHROID) 50 MCG tablet Take 1 tablet (50 mcg total) by mouth daily before breakfast. 09/14/20  Yes Boscia, Heather E, NP  nystatin (MYCOSTATIN/NYSTOP) powder Apply 1 application topically 3 (three) times daily. 11/30/20  Yes Luiz Ochoa, NP  olopatadine (PATANOL) 0.1 % ophthalmic solution instill 1 drop into both eyes twice a day for ITCHY EYES 06/13/15  Yes [provider]  Olopatadine HCl 0.2 % SOLN Place 1 drop into both eyes 2 (two) times daily.   Yes [provider]  rosuvastatin (CRESTOR) 5 MG tablet Take 1 tablet (5 mg total) by mouth daily. 11/30/20  Yes Luiz Ochoa, NP  valACYclovir (VALTREX) 1000 MG tablet Take 1 tablet (1,000 mg total) by mouth daily. Pt can 1 tablet twice a day if flare up for 7 days. 09/14/20  Yes Ronnell Freshwater, NP  Blood Glucose Monitoring Suppl (ACCU-CHEK AVIVA PLUS) w/Device KIT Use as directed Patient not taking: Reported  on 01/09/2021 12/31/17   Ronnell Freshwater, NP    Allergies as of 12/17/2020 - Review Complete 12/07/2020  Allergen Reaction Noted  . Other Diarrhea and Hives 06/26/2015  . Augmentin [amoxicillin-pot clavulanate]  12/07/2019  . Bactrim [sulfamethoxazole-trimethoprim] Hives 12/23/2018  . Ibuprofen  07/16/2015  . Ivp dye [iodinated diagnostic agents] Diarrhea and Hives 06/26/2015  . Macrobid [nitrofurantoin]  12/31/2018  . Prednisone Nausea Only 06/26/2015    Family History  Problem Relation Age of Onset  . Tuberculosis Mother   . Nephrolithiasis Brother     Social History   Socioeconomic History  . Marital status: Divorced    Spouse name: Not on file  . Number of children: Not on file   . Years of education: Not on file  . Highest education level: Not on file  Occupational History  . Not on file  Tobacco Use  . Smoking status: Never Smoker  . Smokeless tobacco: Never Used  Vaping Use  . Vaping Use: Never used  Substance and Sexual Activity  . Alcohol use: No  . Drug use: Never  . Sexual activity: Not on file  Other Topics Concern  . Not on file  Social History Narrative  . Not on file   Social Determinants of Health   Financial Resource Strain: Not on file  Food Insecurity: Not on file  Transportation Needs: Not on file  Physical Activity: Not on file  Stress: Not on file  Social Connections: Not on file  Intimate Partner Violence: Not on file    Review of Systems: See HPI, otherwise negative ROS  Physical Exam: BP (!) 146/85   Pulse (!) 106   Temp (!) 97 F (36.1 C) (Temporal)   Resp 18   Ht 4' 7"  (1.397 m)   Wt 82.6 kg   SpO2 98%   BMI 42.30 kg/m  General:   Alert,  pleasant and cooperative in NAD Head:  Normocephalic and atraumatic. Neck:  Supple; no masses or thyromegaly. Lungs:  Clear throughout to auscultation.    Heart:  Regular rate and rhythm. Abdomen:  Soft, nontender and nondistended. Normal bowel sounds, without guarding, and without rebound.   Neurologic:  Alert and  oriented x4;  grossly normal neurologically.  Impression/Plan: Claudia Martin is here for an colonoscopy to be performed for personal h/o colon polyps  Risks, benefits, limitations, and alternatives regarding  colonoscopy have been reviewed with the patient.  Questions have been answered.  All parties agreeable.   Sherri Sear, MD  01/09/2021, 8:47 AM

## 2021-01-09 NOTE — Anesthesia Postprocedure Evaluation (Signed)
Anesthesia Post Note  Patient: Claudia Martin  Procedure(s) Performed: COLONOSCOPY WITH PROPOFOL (N/A )  Patient location during evaluation: Endoscopy Anesthesia Type: General Level of consciousness: awake and alert Pain management: pain level controlled Vital Signs Assessment: post-procedure vital signs reviewed and stable Respiratory status: spontaneous breathing and respiratory function stable Cardiovascular status: stable Anesthetic complications: no   No complications documented.   Last Vitals:  Vitals:   01/09/21 0917 01/09/21 0921  BP: (!) 86/49 (!) 94/54  Pulse: 63 76  Resp: 15 17  Temp:    SpO2: 97% 97%    Last Pain:  Vitals:   01/09/21 0921  TempSrc:   PainSc: 0-No pain                 Hillis Mcphatter K

## 2021-01-09 NOTE — Anesthesia Preprocedure Evaluation (Addendum)
Anesthesia Evaluation  Patient identified by MRN, date of birth, ID band Patient awake    Reviewed: Allergy & Precautions, NPO status , Patient's Chart, lab work & pertinent test results  History of Anesthesia Complications Negative for: history of anesthetic complications  Airway Mallampati: II       Dental  (+) Poor Dentition, Missing, Chipped, Loose   Pulmonary sleep apnea and Continuous Positive Airway Pressure Ventilation , neg COPD, Not current smoker,           Cardiovascular hypertension, Pt. on medications and Pt. on home beta blockers (-) Past MI and (-) CHF (-) dysrhythmias (-) Valvular Problems/Murmurs     Neuro/Psych neg Seizures    GI/Hepatic Neg liver ROS, neg GERD  ,  Endo/Other  neg diabetesHypothyroidism   Renal/GU negative Renal ROS     Musculoskeletal   Abdominal   Peds  Hematology   Anesthesia Other Findings   Reproductive/Obstetrics                             Anesthesia Physical Anesthesia Plan  ASA: II  Anesthesia Plan: General   Post-op Pain Management:    Induction:   PONV Risk Score and Plan: 3 and Propofol infusion and TIVA  Airway Management Planned: Nasal Cannula  Additional Equipment:   Intra-op Plan:   Post-operative Plan:   Informed Consent: I have reviewed the patients History and Physical, chart, labs and discussed the procedure including the risks, benefits and alternatives for the proposed anesthesia with the patient or authorized representative who has indicated his/her understanding and acceptance.       Plan Discussed with:   Anesthesia Plan Comments:         Anesthesia Quick Evaluation

## 2021-01-09 NOTE — Anesthesia Procedure Notes (Signed)
Date/Time: 01/09/2021 8:48 AM Performed by: Johnna Acosta, CRNA Pre-anesthesia Checklist: Patient identified, Emergency Drugs available, Suction available, Patient being monitored and Timeout performed Patient Re-evaluated:Patient Re-evaluated prior to induction Oxygen Delivery Method: Nasal cannula Preoxygenation: Pre-oxygenation with 100% oxygen Induction Type: IV induction

## 2021-01-10 ENCOUNTER — Encounter: Payer: Self-pay | Admitting: Gastroenterology

## 2021-01-10 LAB — SURGICAL PATHOLOGY

## 2021-01-11 DIAGNOSIS — J3081 Allergic rhinitis due to animal (cat) (dog) hair and dander: Secondary | ICD-10-CM | POA: Diagnosis not present

## 2021-01-11 DIAGNOSIS — J301 Allergic rhinitis due to pollen: Secondary | ICD-10-CM | POA: Diagnosis not present

## 2021-01-11 DIAGNOSIS — J3089 Other allergic rhinitis: Secondary | ICD-10-CM | POA: Diagnosis not present

## 2021-01-17 DIAGNOSIS — J3081 Allergic rhinitis due to animal (cat) (dog) hair and dander: Secondary | ICD-10-CM | POA: Diagnosis not present

## 2021-01-17 DIAGNOSIS — J3089 Other allergic rhinitis: Secondary | ICD-10-CM | POA: Diagnosis not present

## 2021-01-17 DIAGNOSIS — J301 Allergic rhinitis due to pollen: Secondary | ICD-10-CM | POA: Diagnosis not present

## 2021-01-22 DIAGNOSIS — J3089 Other allergic rhinitis: Secondary | ICD-10-CM | POA: Diagnosis not present

## 2021-01-22 DIAGNOSIS — J3081 Allergic rhinitis due to animal (cat) (dog) hair and dander: Secondary | ICD-10-CM | POA: Diagnosis not present

## 2021-01-22 DIAGNOSIS — J301 Allergic rhinitis due to pollen: Secondary | ICD-10-CM | POA: Diagnosis not present

## 2021-01-23 ENCOUNTER — Other Ambulatory Visit: Payer: Self-pay | Admitting: Nurse Practitioner

## 2021-01-23 DIAGNOSIS — I10 Essential (primary) hypertension: Secondary | ICD-10-CM

## 2021-01-29 ENCOUNTER — Telehealth: Payer: Self-pay

## 2021-01-29 NOTE — Telephone Encounter (Signed)
Confirmation of verbal order signed by provider for use of home medical equipment and placed in [place] Patient folder. Claudia Martin

## 2021-02-01 DIAGNOSIS — J3081 Allergic rhinitis due to animal (cat) (dog) hair and dander: Secondary | ICD-10-CM | POA: Diagnosis not present

## 2021-02-01 DIAGNOSIS — J3089 Other allergic rhinitis: Secondary | ICD-10-CM | POA: Diagnosis not present

## 2021-02-01 DIAGNOSIS — J301 Allergic rhinitis due to pollen: Secondary | ICD-10-CM | POA: Diagnosis not present

## 2021-02-05 DIAGNOSIS — J3089 Other allergic rhinitis: Secondary | ICD-10-CM | POA: Diagnosis not present

## 2021-02-05 DIAGNOSIS — J301 Allergic rhinitis due to pollen: Secondary | ICD-10-CM | POA: Diagnosis not present

## 2021-02-05 DIAGNOSIS — J3081 Allergic rhinitis due to animal (cat) (dog) hair and dander: Secondary | ICD-10-CM | POA: Diagnosis not present

## 2021-02-11 ENCOUNTER — Telehealth: Payer: Self-pay

## 2021-02-11 ENCOUNTER — Other Ambulatory Visit: Payer: Self-pay

## 2021-02-11 ENCOUNTER — Other Ambulatory Visit: Payer: Self-pay | Admitting: Internal Medicine

## 2021-02-11 DIAGNOSIS — I10 Essential (primary) hypertension: Secondary | ICD-10-CM

## 2021-02-11 DIAGNOSIS — B009 Herpesviral infection, unspecified: Secondary | ICD-10-CM

## 2021-02-11 MED ORDER — ATENOLOL 25 MG PO TABS: ORAL_TABLET | ORAL | 0 refills | Status: DC

## 2021-02-11 MED ORDER — VALACYCLOVIR HCL 1 G PO TABS: 1000.0000 mg | ORAL_TABLET | Freq: Every day | ORAL | 0 refills | Status: DC

## 2021-02-11 NOTE — Telephone Encounter (Signed)
Patient called needing refills on rx. She stated pharmacy faxed over request, but no response. Transferred patient to [facility]

## 2021-02-12 DIAGNOSIS — J3081 Allergic rhinitis due to animal (cat) (dog) hair and dander: Secondary | ICD-10-CM | POA: Diagnosis not present

## 2021-02-12 DIAGNOSIS — J3089 Other allergic rhinitis: Secondary | ICD-10-CM | POA: Diagnosis not present

## 2021-02-12 DIAGNOSIS — J301 Allergic rhinitis due to pollen: Secondary | ICD-10-CM | POA: Diagnosis not present

## 2021-02-19 DIAGNOSIS — J3089 Other allergic rhinitis: Secondary | ICD-10-CM | POA: Diagnosis not present

## 2021-02-19 DIAGNOSIS — J3081 Allergic rhinitis due to animal (cat) (dog) hair and dander: Secondary | ICD-10-CM | POA: Diagnosis not present

## 2021-02-19 DIAGNOSIS — J301 Allergic rhinitis due to pollen: Secondary | ICD-10-CM | POA: Diagnosis not present

## 2021-02-27 ENCOUNTER — Other Ambulatory Visit: Payer: Self-pay

## 2021-02-27 ENCOUNTER — Encounter: Payer: Self-pay | Admitting: Nurse Practitioner

## 2021-02-27 ENCOUNTER — Ambulatory Visit (INDEPENDENT_AMBULATORY_CARE_PROVIDER_SITE_OTHER): Payer: Medicare Other | Admitting: Nurse Practitioner

## 2021-02-27 DIAGNOSIS — B3731 Acute candidiasis of vulva and vagina: Secondary | ICD-10-CM

## 2021-02-27 DIAGNOSIS — I10 Essential (primary) hypertension: Secondary | ICD-10-CM

## 2021-02-27 DIAGNOSIS — E039 Hypothyroidism, unspecified: Secondary | ICD-10-CM | POA: Diagnosis not present

## 2021-02-27 DIAGNOSIS — B009 Herpesviral infection, unspecified: Secondary | ICD-10-CM

## 2021-02-27 DIAGNOSIS — B373 Candidiasis of vulva and vagina: Secondary | ICD-10-CM | POA: Diagnosis not present

## 2021-02-27 LAB — POCT URINALYSIS DIPSTICK
Bilirubin, UA: NEGATIVE
Blood, UA: NEGATIVE
Glucose, UA: NEGATIVE
Ketones, UA: NEGATIVE
Nitrite, UA: NEGATIVE
Protein, UA: POSITIVE — AB
Spec Grav, UA: 1.02 (ref 1.010–1.025)
Urobilinogen, UA: 0.2 E.U./dL
pH, UA: 5 (ref 5.0–8.0)

## 2021-02-27 MED ORDER — LEVOTHYROXINE SODIUM 50 MCG PO TABS: 50.0000 ug | ORAL_TABLET | Freq: Every day | ORAL | 1 refills | Status: DC

## 2021-02-27 MED ORDER — VALACYCLOVIR HCL 1 G PO TABS: 1000.0000 mg | ORAL_TABLET | Freq: Every day | ORAL | 0 refills | Status: DC

## 2021-02-27 MED ORDER — FLUCONAZOLE 150 MG PO TABS
150.0000 mg | ORAL_TABLET | Freq: Once | ORAL | 0 refills | Status: AC
Start: 2021-02-27 — End: 2021-02-27

## 2021-02-27 MED ORDER — ATENOLOL 25 MG PO TABS: ORAL_TABLET | ORAL | 0 refills | Status: DC

## 2021-02-27 NOTE — Progress Notes (Signed)
[facility] [place], [place] [address]  Internal MEDICINE  Office Visit Note  Patient Name: Claudia Martin  [id number]  [id number]  Date of Service: 03/02/2021  Chief Complaint  Patient presents with  . Follow-up    Pt states she may have a yeast infection, med review  . Hypertension    HPI  Claudia Martin presents for a follow-up visit regarding hypertension and possible yeast infection.  She also wants to review her medications.  She has a history of hypertension kidney disease and hypothyroidism.,  Ectopic pregnancy, diverticulitis, htn- blood pressure elevated.  She is a non-smoker, denies alcohol use, denies illicit drug use. -Patient also complains of a yeast infection states that he ate that she is experiencing vaginal itching.  No burning on urination, no urinary frequency or urgency. Blood pressure today is 140/92.   Current Medication: Outpatient Encounter Medications as of 02/27/2021  Medication Sig Note  . amLODipine (NORVASC) 2.5 MG tablet Take 2 tablets (5 mg total) by mouth daily.   . Blood Glucose Monitoring Suppl (ACCU-CHEK AVIVA PLUS) w/Device KIT Use as directed   . cetirizine (ZYRTEC) 10 MG tablet Take 10 mg by mouth daily.   . chlorhexidine (PERIDEX) 0.12 % solution Use as directed 5 mLs in the mouth or throat 2 (two) times daily.   Marland Kitchen conjugated estrogens (PREMARIN) vaginal cream Use 1 applicatorful twice weekly.   Marland Kitchen EPINEPHrine 0.3 mg/0.3 mL IJ SOAJ injection as needed. 07/16/2015: Received from: [place]  . ergocalciferol (DRISDOL) 1.25 MG (50000 UT) capsule Take 1 capsule (50,000 Units total) by mouth once a week.   . [EXPIRED] fluconazole (DIFLUCAN) 150 MG tablet Take 1 tablet (150 mg total) by mouth once for 1 dose. If still symptomatic, may take a second dose in 3 days.   Marland Kitchen nystatin (MYCOSTATIN/NYSTOP) powder Apply 1 application topically 3 (three) times daily.   Marland Kitchen olopatadine (PATANOL) 0.1 % ophthalmic  solution instill 1 drop into both eyes twice a day for ITCHY EYES 07/16/2015: Received from: External Pharmacy  . Olopatadine HCl 0.2 % SOLN Place 1 drop into both eyes 2 (two) times daily.   . rosuvastatin (CRESTOR) 5 MG tablet Take 1 tablet (5 mg total) by mouth daily.   . [DISCONTINUED] atenolol (TENORMIN) 25 MG tablet TAKE 1 AND 1/2 TABLETS BY MOUTH TWICE DAILY   . [DISCONTINUED] levothyroxine (SYNTHROID) 50 MCG tablet Take 1 tablet (50 mcg total) by mouth daily before breakfast.   . [DISCONTINUED] valACYclovir (VALTREX) 1000 MG tablet Take 1 tablet (1,000 mg total) by mouth daily. Pt can 1 tablet twice a day if flare up for 7 days.   Marland Kitchen atenolol (TENORMIN) 25 MG tablet TAKE 1 AND 1/2 TABLETS BY MOUTH TWICE DAILY   . levothyroxine (SYNTHROID) 50 MCG tablet Take 1 tablet (50 mcg total) by mouth daily before breakfast.   . valACYclovir (VALTREX) 1000 MG tablet Take 1 tablet (1,000 mg total) by mouth daily. Pt can 1 tablet twice a day if flare up for 7 days.    No facility-administered encounter medications on file as of 02/27/2021.    Surgical History: Past Surgical History:  Procedure Laterality Date  . BREAST BIOPSY    . COLON SURGERY    . COLONOSCOPY WITH PROPOFOL N/A 03/15/2018   Procedure: COLONOSCOPY WITH PROPOFOL;  Surgeon: Lin Landsman, MD;  Location: [facility] ENDOSCOPY;  Service: Gastroenterology;  Laterality: N/A;  . COLONOSCOPY WITH PROPOFOL N/A 01/09/2021   Procedure: COLONOSCOPY WITH PROPOFOL;  Surgeon: Lin Landsman, MD;  Location: [facility] ENDOSCOPY;  Service: Gastroenterology;  Laterality: N/A;  . diverticulitis    . ECTOPIC PREGNANCY SURGERY    . fx thumb    . HERNIA REPAIR      Medical History: Past Medical History:  Diagnosis Date  . Abdominal pain   . Allergic rhinitis   . Candidiasis of vulva and vagina   . Cellulitis and abscess   . Diverticulosis   . Dysuria   . Eyelid inflammation   . Hematuria syndrome   . Hemorrhoids   . Herpes genitalis   . Herpes  simplex   . HTN (hypertension)   . Hydronephrosis   . Hypothyroidism   . Incomplete bladder emptying   . Internal hordeolum   . Knee pain   . Otalgia   . Primary ovarian failure   . Sciatica   . Shortness of breath   . Urethral diverticulum   . UTI (lower urinary tract infection)   . Vaginal atrophy   . Vaginitis     Family History: Family History  Problem Relation Age of Onset  . Tuberculosis Mother   . Nephrolithiasis Brother     Social History   Socioeconomic History  . Marital status: Divorced    Spouse name: Not on file  . Number of children: Not on file  . Years of education: Not on file  . Highest education level: Not on file  Occupational History  . Not on file  Tobacco Use  . Smoking status: Never Smoker  . Smokeless tobacco: Never Used  Vaping Use  . Vaping Use: Never used  Substance and Sexual Activity  . Alcohol use: No  . Drug use: Never  . Sexual activity: Not on file  Other Topics Concern  . Not on file  Social History Narrative  . Not on file   Social Determinants of Health   Financial Resource Strain: Not on file  Food Insecurity: Not on file  Transportation Needs: Not on file  Physical Activity: Not on file  Stress: Not on file  Social Connections: Not on file  Intimate Partner Violence: Not on file      Review of Systems  Constitutional: Negative for chills, fatigue and unexpected weight change.  HENT: Negative for congestion, rhinorrhea, sneezing and sore throat.   Eyes: Negative for redness.  Respiratory: Negative for cough, chest tightness and shortness of breath.   Cardiovascular: Negative for chest pain and palpitations.  Gastrointestinal: Negative for abdominal pain, constipation, diarrhea, nausea and vomiting.  Genitourinary: Negative for dysuria and frequency.  Musculoskeletal: Negative for arthralgias, back pain, joint swelling and neck pain.  Skin: Negative for rash.  Neurological: Negative.  Negative for tremors and  numbness.  Hematological: Negative for adenopathy. Does not bruise/bleed easily.  Psychiatric/Behavioral: Negative for behavioral problems (Depression), sleep disturbance and suicidal ideas. The patient is not nervous/anxious.     Vital Signs: BP (!) 160/94   Pulse 70   Temp 98.7 F (37.1 C)   Resp 16   Ht 4' 9"  (1.448 m)   Wt 181 lb 12.8 oz (82.5 kg)   SpO2 97%   BMI 39.34 kg/m    Physical Exam Vitals reviewed.  Constitutional:      General: She is not in acute distress.    Appearance: She is well-developed. She is not diaphoretic.  HENT:     Head: Normocephalic and atraumatic.  Eyes:     Pupils: Pupils are equal, round, and reactive to light.  Neck:     Thyroid: No thyromegaly.     Vascular: No JVD.     Trachea: No tracheal deviation.  Cardiovascular:     Rate and Rhythm: Normal rate and regular rhythm.     Pulses: Normal pulses.     Heart sounds: Normal heart sounds. No murmur heard. No friction rub. No gallop.   Pulmonary:     Effort: Pulmonary effort is normal. No respiratory distress.     Breath sounds: Normal breath sounds. No wheezing or rales.  Chest:     Chest wall: No tenderness.  Abdominal:     General: Bowel sounds are normal.     Palpations: Abdomen is soft.  Musculoskeletal:        General: Normal range of motion.     Cervical back: Normal range of motion and neck supple.  Lymphadenopathy:     Cervical: No cervical adenopathy.  Skin:    General: Skin is warm and dry.  Neurological:     Mental Status: She is alert and oriented to person, place, and time.     Cranial Nerves: No cranial nerve deficit.  Psychiatric:        Behavior: Behavior normal.        Thought Content: Thought content normal.        Judgment: Judgment normal.    Assessment/Plan: 1. Essential hypertension Currently taking atenolol and amlodipine. Patient has run out of atenolol completely and has not had any today. Blood pressure elevated today, 160/94. refull ordered. -  atenolol (TENORMIN) 25 MG tablet; TAKE 1 AND 1/2 TABLETS BY MOUTH TWICE DAILY  Dispense: 270 tablet; Refill: 0  2. Acquired hypothyroidism Taking levothyroxine, her levels are normal as of February 2022. Will recheck TSH/ free T4.  - levothyroxine (SYNTHROID) 50 MCG tablet; Take 1 tablet (50 mcg total) by mouth daily before breakfast.  Dispense: 90 tablet; Refill: 1  3. Vaginal candidiasis Reporting symptoms of a vaginal yeast infection, fluconazole ordered to treat. Patient has a history of occasional yeast infections.  - fluconazole (DIFLUCAN) 150 MG tablet; Take 1 tablet (150 mg total) by mouth once for 1 dose. If still symptomatic, may take a second dose in 3 days.  Dispense: 2 tablet; Refill: 0 - POCT Urinalysis Dipstick  4. Herpes simplex infection History of HSV type 2, takes 1 tablet daily for prophylaxis/prevention. , refill ordered.  - valACYclovir (VALTREX) 1000 MG tablet; Take 1 tablet (1,000 mg total) by mouth daily. Pt can 1 tablet twice a day if flare up for 7 days.  Dispense: 90 tablet; Refill: 0   General Counseling: Rielyn verbalizes understanding of the findings of todays visit and agrees with plan of treatment. I have discussed any further diagnostic evaluation that may be needed or ordered today. We also reviewed her medications today. she has been encouraged to call the office with any questions or concerns that should arise related to todays visit.    Orders Placed This Encounter  Procedures  . POCT Urinalysis Dipstick    Meds ordered this encounter  Medications  . fluconazole (DIFLUCAN) 150 MG tablet    Sig: Take 1 tablet (150 mg total) by mouth once for 1 dose. If still symptomatic, may take a second dose in 3 days.    Dispense:  2 tablet    Refill:  0  . valACYclovir (VALTREX) 1000 MG tablet    Sig: Take 1 tablet (1,000 mg total) by mouth daily. Pt can 1 tablet twice a day if flare  up for 7 days.    Dispense:  90 tablet    Refill:  0  . levothyroxine  (SYNTHROID) 50 MCG tablet    Sig: Take 1 tablet (50 mcg total) by mouth daily before breakfast.    Dispense:  90 tablet    Refill:  1  . atenolol (TENORMIN) 25 MG tablet    Sig: TAKE 1 AND 1/2 TABLETS BY MOUTH TWICE DAILY    Dispense:  270 tablet    Refill:  0    **Patient requests 90 days supply**   Return in about 3 months (around 05/30/2021) for F/U, med refill, Daniel Ritthaler PCP recheck BP.   Total time spent:30 Minutes Time spent includes review of chart, medications, test results, and follow up plan with the patient.   [place] Controlled Substance Database was reviewed by me.  This patient was seen by Jonetta Osgood, FNP-C in collaboration with Dr. Clayborn Bigness as a part of collaborative care agreement.   Dr Lavera Guise Internal medicine

## 2021-02-28 DIAGNOSIS — J301 Allergic rhinitis due to pollen: Secondary | ICD-10-CM | POA: Diagnosis not present

## 2021-02-28 DIAGNOSIS — J3081 Allergic rhinitis due to animal (cat) (dog) hair and dander: Secondary | ICD-10-CM | POA: Diagnosis not present

## 2021-02-28 DIAGNOSIS — J3089 Other allergic rhinitis: Secondary | ICD-10-CM | POA: Diagnosis not present

## 2021-03-06 ENCOUNTER — Other Ambulatory Visit: Payer: Self-pay

## 2021-03-06 ENCOUNTER — Ambulatory Visit (INDEPENDENT_AMBULATORY_CARE_PROVIDER_SITE_OTHER): Payer: Medicare Other

## 2021-03-06 DIAGNOSIS — G4733 Obstructive sleep apnea (adult) (pediatric): Secondary | ICD-10-CM | POA: Diagnosis not present

## 2021-03-06 NOTE — Progress Notes (Signed)
Claudia Martin received new cpap. She has a good understanding of using cleaning and compliance of cpap. I will see her in sleep lab in about 4 weeks and a follow up with Dr. In about 6 weeks.

## 2021-03-07 DIAGNOSIS — J301 Allergic rhinitis due to pollen: Secondary | ICD-10-CM | POA: Diagnosis not present

## 2021-03-07 DIAGNOSIS — J3081 Allergic rhinitis due to animal (cat) (dog) hair and dander: Secondary | ICD-10-CM | POA: Diagnosis not present

## 2021-03-07 DIAGNOSIS — J3089 Other allergic rhinitis: Secondary | ICD-10-CM | POA: Diagnosis not present

## 2021-03-12 DIAGNOSIS — J3089 Other allergic rhinitis: Secondary | ICD-10-CM | POA: Diagnosis not present

## 2021-03-12 DIAGNOSIS — J301 Allergic rhinitis due to pollen: Secondary | ICD-10-CM | POA: Diagnosis not present

## 2021-03-12 DIAGNOSIS — J3081 Allergic rhinitis due to animal (cat) (dog) hair and dander: Secondary | ICD-10-CM | POA: Diagnosis not present

## 2021-03-19 DIAGNOSIS — J301 Allergic rhinitis due to pollen: Secondary | ICD-10-CM | POA: Diagnosis not present

## 2021-03-19 DIAGNOSIS — J3081 Allergic rhinitis due to animal (cat) (dog) hair and dander: Secondary | ICD-10-CM | POA: Diagnosis not present

## 2021-03-19 DIAGNOSIS — J3089 Other allergic rhinitis: Secondary | ICD-10-CM | POA: Diagnosis not present

## 2021-03-20 ENCOUNTER — Telehealth: Payer: Self-pay

## 2021-03-20 NOTE — Telephone Encounter (Signed)
Completed medical records for [facility] Mailed to [address] [place] [address] Faxed payment request $59.75 (for 6 pts total) to [phone] (P) [phone]

## 2021-03-26 DIAGNOSIS — J3089 Other allergic rhinitis: Secondary | ICD-10-CM | POA: Diagnosis not present

## 2021-03-26 DIAGNOSIS — J3081 Allergic rhinitis due to animal (cat) (dog) hair and dander: Secondary | ICD-10-CM | POA: Diagnosis not present

## 2021-03-26 DIAGNOSIS — J301 Allergic rhinitis due to pollen: Secondary | ICD-10-CM | POA: Diagnosis not present

## 2021-04-02 DIAGNOSIS — G4733 Obstructive sleep apnea (adult) (pediatric): Secondary | ICD-10-CM | POA: Diagnosis not present

## 2021-04-02 DIAGNOSIS — J301 Allergic rhinitis due to pollen: Secondary | ICD-10-CM | POA: Diagnosis not present

## 2021-04-02 DIAGNOSIS — J3081 Allergic rhinitis due to animal (cat) (dog) hair and dander: Secondary | ICD-10-CM | POA: Diagnosis not present

## 2021-04-02 DIAGNOSIS — J3089 Other allergic rhinitis: Secondary | ICD-10-CM | POA: Diagnosis not present

## 2021-04-03 ENCOUNTER — Other Ambulatory Visit: Payer: Self-pay

## 2021-04-03 ENCOUNTER — Ambulatory Visit (INDEPENDENT_AMBULATORY_CARE_PROVIDER_SITE_OTHER): Payer: Medicare Other

## 2021-04-03 DIAGNOSIS — G4733 Obstructive sleep apnea (adult) (pediatric): Secondary | ICD-10-CM | POA: Diagnosis not present

## 2021-04-03 NOTE — Progress Notes (Signed)
95 percentile pressure 7   95th percentile leak 0.0   apnea index 1.6 /hr  apnea-hypopnea index  3.3 /hr   total days used  >4 hr 25 days  total days used <4 hr 0 days  Total compliance 83 percent   She is doing great She has not missed a day since starting new cpap. No problems or questions at this time

## 2021-04-05 DIAGNOSIS — G4733 Obstructive sleep apnea (adult) (pediatric): Secondary | ICD-10-CM | POA: Diagnosis not present

## 2021-04-09 DIAGNOSIS — J3089 Other allergic rhinitis: Secondary | ICD-10-CM | POA: Diagnosis not present

## 2021-04-09 DIAGNOSIS — J301 Allergic rhinitis due to pollen: Secondary | ICD-10-CM | POA: Diagnosis not present

## 2021-04-09 DIAGNOSIS — J3081 Allergic rhinitis due to animal (cat) (dog) hair and dander: Secondary | ICD-10-CM | POA: Diagnosis not present

## 2021-04-10 ENCOUNTER — Telehealth: Payer: Self-pay

## 2021-04-10 NOTE — Telephone Encounter (Signed)
Left vm to screen for 04/11/21 appointment-Toni

## 2021-04-11 ENCOUNTER — Ambulatory Visit (INDEPENDENT_AMBULATORY_CARE_PROVIDER_SITE_OTHER): Payer: Medicare Other | Admitting: Physician Assistant

## 2021-04-11 ENCOUNTER — Other Ambulatory Visit: Payer: Self-pay

## 2021-04-11 ENCOUNTER — Encounter: Payer: Self-pay | Admitting: Physician Assistant

## 2021-04-11 DIAGNOSIS — E669 Obesity, unspecified: Secondary | ICD-10-CM | POA: Diagnosis not present

## 2021-04-11 DIAGNOSIS — Z7189 Other specified counseling: Secondary | ICD-10-CM

## 2021-04-11 DIAGNOSIS — I10 Essential (primary) hypertension: Secondary | ICD-10-CM | POA: Diagnosis not present

## 2021-04-11 DIAGNOSIS — G4733 Obstructive sleep apnea (adult) (pediatric): Secondary | ICD-10-CM | POA: Diagnosis not present

## 2021-04-11 NOTE — Progress Notes (Signed)
[facility] [place], [place] [address]  Pulmonary Sleep Medicine   Office Visit Note  Patient Name: Claudia Martin DOB: [birth year] MRN [id number]  Date of Service: 04/16/2021  Complaints/HPI: Pt is here for routine pulmonary visit for replacement CPAP machine. Denies any problems including dryness or morning headaches. Usually sleeps about 8 hours per night. AHI well controlled at 3.3 per hour with good compliance. No complaints today and is happy with new machine. Denies wheezing or SOB.  ROS  General: (-) fever, (-) chills, (-) night sweats, (-) weakness Skin: (-) rashes, (-) itching,. Eyes: (-) visual changes, (-) redness, (-) itching. Nose and Sinuses: (-) nasal stuffiness or itchiness, (-) postnasal drip, (-) nosebleeds, (-) sinus trouble. Mouth and Throat: (-) sore throat, (-) hoarseness. Neck: (-) swollen glands, (-) enlarged thyroid, (-) neck pain. Respiratory: - cough, (-) bloody sputum, - shortness of breath, - wheezing. Cardiovascular: - ankle swelling, (-) chest pain. Lymphatic: (-) lymph node enlargement. Neurologic: (-) numbness, (-) tingling. Psychiatric: (-) anxiety, (-) depression   Current Medication: Outpatient Encounter Medications as of 04/11/2021  Medication Sig Note   amLODipine (NORVASC) 2.5 MG tablet Take 2 tablets (5 mg total) by mouth daily.    atenolol (TENORMIN) 25 MG tablet TAKE 1 AND 1/2 TABLETS BY MOUTH TWICE DAILY    Blood Glucose Monitoring Suppl (ACCU-CHEK AVIVA PLUS) w/Device KIT Use as directed    cetirizine (ZYRTEC) 10 MG tablet Take 10 mg by mouth daily.    chlorhexidine (PERIDEX) 0.12 % solution Use as directed 5 mLs in the mouth or throat 2 (two) times daily.    conjugated estrogens (PREMARIN) vaginal cream Use 1 applicatorful twice weekly.    EPINEPHrine 0.3 mg/0.3 mL IJ SOAJ injection as needed. 07/16/2015: Received from: [facility]   ergocalciferol (DRISDOL) 1.25 MG (50000 UT)  capsule Take 1 capsule (50,000 Units total) by mouth once a week.    levothyroxine (SYNTHROID) 50 MCG tablet Take 1 tablet (50 mcg total) by mouth daily before breakfast.    rosuvastatin (CRESTOR) 5 MG tablet Take 1 tablet (5 mg total) by mouth daily.    valACYclovir (VALTREX) 1000 MG tablet Take 1 tablet (1,000 mg total) by mouth daily. Pt can 1 tablet twice a day if flare up for 7 days.    [DISCONTINUED] nystatin (MYCOSTATIN/NYSTOP) powder Apply 1 application topically 3 (three) times daily. (Patient not taking: Reported on 04/11/2021)    [DISCONTINUED] olopatadine (PATANOL) 0.1 % ophthalmic solution instill 1 drop into both eyes twice a day for ITCHY EYES (Patient not taking: Reported on 04/11/2021) 07/16/2015: Received from: External Pharmacy   [DISCONTINUED] Olopatadine HCl 0.2 % SOLN Place 1 drop into both eyes 2 (two) times daily. (Patient not taking: Reported on 04/11/2021)    No facility-administered encounter medications on file as of 04/11/2021.    Surgical History: Past Surgical History:  Procedure Laterality Date   BREAST BIOPSY     COLON SURGERY     COLONOSCOPY WITH PROPOFOL N/A 03/15/2018   Procedure: COLONOSCOPY WITH PROPOFOL;  Surgeon: Lin Landsman, MD;  [facility] ENDOSCOPY;  Service: Gastroenterology;  Laterality: N/A;   COLONOSCOPY WITH PROPOFOL N/A 01/09/2021   Procedure: COLONOSCOPY WITH PROPOFOL;  Surgeon: Lin Landsman, MD;  Location: [facility] ENDOSCOPY;  Service: Gastroenterology;  Laterality: N/A;   diverticulitis     ECTOPIC PREGNANCY SURGERY     fx thumb     HERNIA REPAIR      Medical History: Past Medical History:  Diagnosis Date   Abdominal pain    Allergic rhinitis    Candidiasis of vulva and vagina    Cellulitis and abscess    Diverticulosis    Dysuria    Eyelid inflammation    Hematuria syndrome    Hemorrhoids    Herpes genitalis    Herpes simplex    HTN (hypertension)    Hydronephrosis    Hypothyroidism    Incomplete bladder emptying     Internal hordeolum    Knee pain    Otalgia    Primary ovarian failure    Sciatica    Shortness of breath    Urethral diverticulum    UTI (lower urinary tract infection)    Vaginal atrophy    Vaginitis     Family History: Family History  Problem Relation Age of Onset   Tuberculosis Mother    Nephrolithiasis Brother     Social History: Social History   Socioeconomic History   Marital status: Divorced    Spouse name: Not on file   Number of children: Not on file   Years of education: Not on file   Highest education level: Not on file  Occupational History   Not on file  Tobacco Use   Smoking status: Never   Smokeless tobacco: Never  Vaping Use   Vaping Use: Never used  Substance and Sexual Activity   Alcohol use: No   Drug use: Never   Sexual activity: Not on file  Other Topics Concern   Not on file  Social History Narrative   Not on file   Social Determinants of Health   Financial Resource Strain: Not on file  Food Insecurity: Not on file  Transportation Needs: Not on file  Physical Activity: Not on file  Stress: Not on file  Social Connections: Not on file  Intimate Partner Violence: Not on file    Vital Signs: Blood pressure 132/86, pulse 75, temperature (!) 97.3 F (36.3 C), resp. rate 16, height 4' 11" (1.499 m), weight 179 lb (81.2 kg), SpO2 97 %.  Examination: General Appearance: The patient is well-developed, well-nourished, and in no distress. Skin: Gross inspection of skin unremarkable. Head: normocephalic, no gross deformities. Eyes: no gross deformities noted. ENT: ears appear grossly normal no exudates. Neck: Supple. No thyromegaly. No LAD. Respiratory: Lungs clear to auscultation bilaterally. Cardiovascular: Normal S1 and S2 without murmur or rub. Extremities: No cyanosis. pulses are equal. Neurologic: Alert and oriented. No involuntary movements.  LABS: Recent Results (from the past 2160 hour(s))  POCT Urinalysis Dipstick      Status: Abnormal   Collection Time: 02/27/21  3:06 PM  Result Value Ref Range   Color, UA     Clarity, UA     Glucose, UA Negative Negative   Bilirubin, UA Negative    Ketones, UA Negative    Spec Grav, UA 1.020 1.010 - 1.025   Blood, UA Negative    pH, UA 5.0 5.0 - 8.0   Protein, UA Positive (A) Negative   Urobilinogen, UA 0.2 0.2 or 1.0 E.U./dL   Nitrite, UA Negative    Leukocytes, UA Moderate (2+) (A) Negative   Appearance     Odor      Radiology: No results found.  No results found.  No results found.    Assessment and Plan: Patient Active Problem List   Diagnosis Date Noted   History of colonic polyps    Urinary tract infection without hematuria 01/04/2020   Candidiasis of skin 01/04/2020  Ventral hernia without obstruction or gangrene 11/28/2019   Herpes simplex infection 10/27/2019   Stomatitis and mucositis 10/27/2019   Encounter for general adult medical examination with abnormal findings 10/27/2019   Vitamin B12 deficiency 06/30/2018   Unspecified menopausal and perimenopausal disorder 06/30/2018   Vitamin D deficiency 06/30/2018   Flu vaccine need 06/30/2018   Urinary tract infection with hematuria 02/05/2018   Acquired hypothyroidism 02/05/2018   Essential hypertension 02/05/2018   Screening for breast cancer 02/05/2018   Dysuria 02/05/2018   Obstructive sleep apnea (adult) (pediatric) 01/06/2018    1. Obstructive sleep apnea Continue excellent compliance.  2. CPAP use counseling CPAP couseling-Discussed importance of adequate CPAP use as well as proper care and cleaning techniques of machine and all supplies.  3. Essential hypertension Continue current medication and f/u with PCP.  4. Obesity (BMI 35.0-39.9 without comorbidity) Obesity Counseling: Had a lengthy discussion regarding patients BMI and weight issues. Patient was instructed on portion control as well as increased activity. Also discussed caloric restrictions with trying to maintain  intake less than 2000 Kcal. Discussions were made in accordance with the 5As of weight management. Simple actions such as not eating late and if able to, taking a walk is suggested.    General Counseling: I have discussed the findings of the evaluation and examination with Verdis Frederickson.  I have also discussed any further diagnostic evaluation thatmay be needed or ordered today. Enrika verbalizes understanding of the findings of todays visit. We also reviewed her medications today and discussed drug interactions and side effects including but not limited excessive drowsiness and altered mental states. We also discussed that there is always a risk not just to her but also people around her. she has been encouraged to call the office with any questions or concerns that should arise related to todays visit.  No orders of the defined types were placed in this encounter.    Time spent: 30  I have personally obtained a history, examined the patient, evaluated laboratory and imaging results, formulated the assessment and plan and placed orders. This patient was seen by Drema Dallas, PA-C in collaboration with Dr. Devona Konig as a part of collaborative care agreement.     Allyne Gee, MD [facility] Pulmonary and Critical Care Sleep medicine

## 2021-04-16 ENCOUNTER — Telehealth: Payer: Self-pay | Admitting: Internal Medicine

## 2021-04-16 DIAGNOSIS — J3081 Allergic rhinitis due to animal (cat) (dog) hair and dander: Secondary | ICD-10-CM | POA: Diagnosis not present

## 2021-04-16 DIAGNOSIS — J3089 Other allergic rhinitis: Secondary | ICD-10-CM | POA: Diagnosis not present

## 2021-04-16 DIAGNOSIS — J301 Allergic rhinitis due to pollen: Secondary | ICD-10-CM | POA: Diagnosis not present

## 2021-04-16 NOTE — Chronic Care Management (AMB) (Signed)
  Chronic Care Management   Outreach Note  04/16/2021 Name: Claudia Martin MRN: [id number] DOB: 1946/04/02  Referred by: Lavera Guise, MD Reason for referral : No chief complaint on file.   An unsuccessful telephone outreach was attempted today. The patient was referred to the pharmacist for assistance with care management and care coordination.   Follow Up Plan:   Tatjana Dellinger Upstream Scheduler

## 2021-04-16 NOTE — Patient Instructions (Signed)

## 2021-04-16 NOTE — Chronic Care Management (AMB) (Signed)
  Chronic Care Management   Note  04/16/2021 Name: Shiann Kam MRN: [id number] DOB: [birth year]  Saavi Mceachron is a 75 y.o. year old female who is a primary care patient of Lavera Guise, MD. I reached out to [employer] by phone today in response to a referral sent by Ms. Hosie [place]'s PCP, Lavera Guise, MD.   Ms. [name] was given information about Chronic Care Management services today including:  CCM service includes personalized support from designated clinical staff supervised by her physician, including individualized plan of care and coordination with other care providers 24/7 contact phone numbers for assistance for urgent and routine care needs. Service will only be billed when office clinical staff spend 20 minutes or more in a month to coordinate care. Only one practitioner may furnish and bill the service in a calendar month. The patient may stop CCM services at any time (effective at the end of the month) by phone call to the office staff.   Patient agreed to services and verbal consent obtained.   Follow up plan:   Tatjana Secretary/administrator

## 2021-04-25 DIAGNOSIS — J301 Allergic rhinitis due to pollen: Secondary | ICD-10-CM | POA: Diagnosis not present

## 2021-04-25 DIAGNOSIS — H1045 Other chronic allergic conjunctivitis: Secondary | ICD-10-CM | POA: Diagnosis not present

## 2021-04-25 DIAGNOSIS — J3081 Allergic rhinitis due to animal (cat) (dog) hair and dander: Secondary | ICD-10-CM | POA: Diagnosis not present

## 2021-04-25 DIAGNOSIS — J3089 Other allergic rhinitis: Secondary | ICD-10-CM | POA: Diagnosis not present

## 2021-04-30 DIAGNOSIS — J301 Allergic rhinitis due to pollen: Secondary | ICD-10-CM | POA: Diagnosis not present

## 2021-04-30 DIAGNOSIS — J3081 Allergic rhinitis due to animal (cat) (dog) hair and dander: Secondary | ICD-10-CM | POA: Diagnosis not present

## 2021-04-30 DIAGNOSIS — J3089 Other allergic rhinitis: Secondary | ICD-10-CM | POA: Diagnosis not present

## 2021-05-03 DIAGNOSIS — H40003 Preglaucoma, unspecified, bilateral: Secondary | ICD-10-CM | POA: Diagnosis not present

## 2021-05-06 DIAGNOSIS — G4733 Obstructive sleep apnea (adult) (pediatric): Secondary | ICD-10-CM | POA: Diagnosis not present

## 2021-05-09 DIAGNOSIS — J3089 Other allergic rhinitis: Secondary | ICD-10-CM | POA: Diagnosis not present

## 2021-05-09 DIAGNOSIS — J301 Allergic rhinitis due to pollen: Secondary | ICD-10-CM | POA: Diagnosis not present

## 2021-05-09 DIAGNOSIS — J3081 Allergic rhinitis due to animal (cat) (dog) hair and dander: Secondary | ICD-10-CM | POA: Diagnosis not present

## 2021-05-16 DIAGNOSIS — J301 Allergic rhinitis due to pollen: Secondary | ICD-10-CM | POA: Diagnosis not present

## 2021-05-16 DIAGNOSIS — J3081 Allergic rhinitis due to animal (cat) (dog) hair and dander: Secondary | ICD-10-CM | POA: Diagnosis not present

## 2021-05-16 DIAGNOSIS — J3089 Other allergic rhinitis: Secondary | ICD-10-CM | POA: Diagnosis not present

## 2021-05-21 DIAGNOSIS — H2511 Age-related nuclear cataract, right eye: Secondary | ICD-10-CM | POA: Diagnosis not present

## 2021-05-21 DIAGNOSIS — J3081 Allergic rhinitis due to animal (cat) (dog) hair and dander: Secondary | ICD-10-CM | POA: Diagnosis not present

## 2021-05-21 DIAGNOSIS — J3089 Other allergic rhinitis: Secondary | ICD-10-CM | POA: Diagnosis not present

## 2021-05-21 DIAGNOSIS — J301 Allergic rhinitis due to pollen: Secondary | ICD-10-CM | POA: Diagnosis not present

## 2021-05-22 ENCOUNTER — Other Ambulatory Visit: Payer: Self-pay

## 2021-05-22 DIAGNOSIS — N959 Unspecified menopausal and perimenopausal disorder: Secondary | ICD-10-CM

## 2021-05-22 MED ORDER — ESTROGENS, CONJUGATED 0.625 MG/GM VA CREA
TOPICAL_CREAM | VAGINAL | 4 refills | Status: DC
Start: 2021-05-22 — End: 2022-06-02

## 2021-05-23 ENCOUNTER — Encounter: Payer: Self-pay | Admitting: Ophthalmology

## 2021-05-24 ENCOUNTER — Encounter: Payer: Self-pay | Admitting: Anesthesiology

## 2021-05-28 DIAGNOSIS — J3089 Other allergic rhinitis: Secondary | ICD-10-CM | POA: Diagnosis not present

## 2021-05-28 DIAGNOSIS — J3081 Allergic rhinitis due to animal (cat) (dog) hair and dander: Secondary | ICD-10-CM | POA: Diagnosis not present

## 2021-05-28 DIAGNOSIS — J301 Allergic rhinitis due to pollen: Secondary | ICD-10-CM | POA: Diagnosis not present

## 2021-05-31 ENCOUNTER — Other Ambulatory Visit: Payer: Self-pay

## 2021-05-31 ENCOUNTER — Ambulatory Visit (INDEPENDENT_AMBULATORY_CARE_PROVIDER_SITE_OTHER): Payer: Medicare Other | Admitting: Nurse Practitioner

## 2021-05-31 ENCOUNTER — Encounter: Payer: Self-pay | Admitting: Nurse Practitioner

## 2021-05-31 VITALS — BP 130/80 | HR 60 | Temp 97.4°F | Resp 16 | Ht <= 58 in | Wt 179.0 lb

## 2021-05-31 DIAGNOSIS — I10 Essential (primary) hypertension: Secondary | ICD-10-CM | POA: Diagnosis not present

## 2021-05-31 DIAGNOSIS — Z9989 Dependence on other enabling machines and devices: Secondary | ICD-10-CM

## 2021-05-31 DIAGNOSIS — K1230 Oral mucositis (ulcerative), unspecified: Secondary | ICD-10-CM

## 2021-05-31 DIAGNOSIS — K121 Other forms of stomatitis: Secondary | ICD-10-CM | POA: Diagnosis not present

## 2021-05-31 DIAGNOSIS — G4733 Obstructive sleep apnea (adult) (pediatric): Secondary | ICD-10-CM

## 2021-05-31 DIAGNOSIS — J301 Allergic rhinitis due to pollen: Secondary | ICD-10-CM | POA: Diagnosis not present

## 2021-05-31 MED ORDER — CHLORHEXIDINE GLUCONATE 0.12 % MT SOLN: 5.0000 mL | Freq: Two times a day (BID) | OROMUCOSAL | 1 refills | Status: DC

## 2021-05-31 NOTE — Progress Notes (Signed)
[facility] [place], [place] [address]  Internal MEDICINE  Office Visit Note  Patient Name: Claudia Martin  [id number]  [id number]  Date of Service: 05/31/2021  Chief Complaint  Patient presents with   Follow-up    refills   Gastroesophageal Reflux   Sleep Apnea   Hypertension    HPI Claudia Martin presents for a follow up visit for medication refills. She has been using CPAP at night which has been helping. Her acid reflux is stable with current medication. She has upcoming laser surgery for cataract removal on the right eye next week and then the left eye on 9/14. Her blood pressure is well controlled.     Current Medication: Outpatient Encounter Medications as of 05/31/2021  Medication Sig Note   cetirizine (ZYRTEC) 10 MG tablet Take 10 mg by mouth daily.    conjugated estrogens (PREMARIN) vaginal cream Use 1 applicatorful twice weekly. (Patient taking differently: Place 1 Applicatorful vaginally 2 (two) times a week. Use just a little bit, not an applicatorful.)    EPINEPHrine 0.3 mg/0.3 mL IJ SOAJ injection as needed. 07/16/2015: Received from: [place]   levothyroxine (SYNTHROID) 50 MCG tablet Take 1 tablet (50 mcg total) by mouth daily before breakfast.    valACYclovir (VALTREX) 1000 MG tablet Take 1 tablet (1,000 mg total) by mouth daily. Pt can 1 tablet twice a day if flare up for 7 days.    Zoster Vaccine Adjuvanted [facility]) injection Inject 0.5 mLs into the muscle once.    [DISCONTINUED] amLODipine (NORVASC) 2.5 MG tablet Take 2 tablets (5 mg total) by mouth daily.    [DISCONTINUED] atenolol (TENORMIN) 25 MG tablet TAKE 1 AND 1/2 TABLETS BY MOUTH TWICE DAILY (Patient taking differently: Take 37.5 mg by mouth 2 (two) times daily.)    [DISCONTINUED] chlorhexidine (PERIDEX) 0.12 % solution Use as directed 5 mLs in the mouth or throat 2 (two) times daily.    [DISCONTINUED] rosuvastatin (CRESTOR) 5 MG tablet Take 1 tablet (5 mg  total) by mouth daily.    chlorhexidine (PERIDEX) 0.12 % solution Use as directed 5 mLs in the mouth or throat 2 (two) times daily.    [DISCONTINUED] Blood Glucose Monitoring Suppl (ACCU-CHEK AVIVA PLUS) w/Device KIT Use as directed    [DISCONTINUED] ergocalciferol (DRISDOL) 1.25 MG (50000 UT) capsule Take 1 capsule (50,000 Units total) by mouth once a week. (Patient not taking: Reported on 05/31/2021)    No facility-administered encounter medications on file as of 05/31/2021.    Surgical History: Past Surgical History:  Procedure Laterality Date   BREAST BIOPSY     CATARACT EXTRACTION W/PHACO Right 06/05/2021   Procedure: CATARACT EXTRACTION PHACO AND INTRAOCULAR LENS PLACEMENT ([place]) RIGHT;  Surgeon: Leandrew Koyanagi, MD;  Location: [place];  Service: Ophthalmology;  Laterality: Right;  3.02 00:39.5   COLON SURGERY     COLONOSCOPY WITH PROPOFOL N/A 03/15/2018   Procedure: COLONOSCOPY WITH PROPOFOL;  Surgeon: Lin Landsman, MD;  Location: [facility] ENDOSCOPY;  Service: Gastroenterology;  Laterality: N/A;   COLONOSCOPY WITH PROPOFOL N/A 01/09/2021   Procedure: COLONOSCOPY WITH PROPOFOL;  Surgeon: Lin Landsman, MD;  Location: [facility] ENDOSCOPY;  Service: Gastroenterology;  Laterality: N/A;   CORONARY/GRAFT ACUTE MI REVASCULARIZATION N/A 06/06/2021   Procedure: Coronary/Graft Acute MI Revascularization;  Surgeon: Yolonda Kida, MD;  Location: [place] CV LAB;  Service: Cardiovascular;  Laterality: N/A;   diverticulitis     ECTOPIC PREGNANCY SURGERY     fx thumb  HERNIA REPAIR     LEFT HEART CATH AND CORONARY ANGIOGRAPHY N/A 06/06/2021   Procedure: LEFT HEART CATH AND CORONARY ANGIOGRAPHY;  Surgeon: Yolonda Kida, MD;  Location: [place] CV LAB;  Service: Cardiovascular;  Laterality: N/A;    Medical History: Past Medical History:  Diagnosis Date   Abdominal pain    Allergic rhinitis    Arthritis    Candidiasis of vulva and vagina    Cellulitis and  abscess    Diverticulosis    Dysuria    Enlarged heart    Eyelid inflammation    GERD (gastroesophageal reflux disease)    Hematuria syndrome    Hemorrhoids    Herpes genitalis    Herpes simplex    HTN (hypertension)    Hydronephrosis    Hypothyroidism    Incomplete bladder emptying    Internal hordeolum    Knee pain    Otalgia    Primary ovarian failure    Sciatica    Shortness of breath    Sinus bradycardia    Sleep apnea    uses CPAP   Urethral diverticulum    UTI (lower urinary tract infection)    Vaginal atrophy    Vaginitis     Family History: Family History  Problem Relation Age of Onset   Tuberculosis Mother    Nephrolithiasis Brother     Social History   Socioeconomic History   Marital status: Divorced    Spouse name: Not on file   Number of children: Not on file   Years of education: Not on file   Highest education level: Not on file  Occupational History   Not on file  Tobacco Use   Smoking status: Never   Smokeless tobacco: Never  Vaping Use   Vaping Use: Never used  Substance and Sexual Activity   Alcohol use: No   Drug use: Never   Sexual activity: Not on file  Other Topics Concern   Not on file  Social History Narrative   Not on file   Social Determinants of Health   Financial Resource Strain: Not on file  Food Insecurity: Not on file  Transportation Needs: Not on file  Physical Activity: Not on file  Stress: Not on file  Social Connections: Not on file  Intimate Partner Violence: Not on file      Review of Systems  Constitutional:  Negative for chills, fatigue and unexpected weight change.  HENT:  Negative for congestion, rhinorrhea, sneezing and sore throat.   Eyes:  Negative for redness.  Respiratory:  Negative for cough, chest tightness and shortness of breath.   Cardiovascular:  Negative for chest pain and palpitations.  Gastrointestinal:  Negative for abdominal pain, constipation, diarrhea, nausea and vomiting.   Genitourinary:  Negative for dysuria and frequency.  Musculoskeletal:  Negative for arthralgias, back pain, joint swelling and neck pain.  Skin:  Negative for rash.  Neurological: Negative.  Negative for tremors and numbness.  Hematological:  Negative for adenopathy. Does not bruise/bleed easily.  Psychiatric/Behavioral:  Negative for behavioral problems (Depression), sleep disturbance and suicidal ideas. The patient is not nervous/anxious.    Vital Signs: BP 130/80 Comment: 142/66  Pulse 60   Temp (!) 97.4 F (36.3 C)   Resp 16   Ht 4' 7"  (1.397 m)   Wt 179 lb (81.2 kg)   SpO2 98%   BMI 41.60 kg/m    Physical Exam Vitals reviewed.  Constitutional:      General: She is not  in acute distress.    Appearance: Normal appearance. She is obese. She is not ill-appearing.  HENT:     Head: Normocephalic and atraumatic.  Eyes:     Extraocular Movements: Extraocular movements intact.     Pupils: Pupils are equal, round, and reactive to light.  Cardiovascular:     Rate and Rhythm: Normal rate and regular rhythm.  Pulmonary:     Effort: Pulmonary effort is normal. No respiratory distress.  Neurological:     Mental Status: She is alert and oriented to person, place, and time.     Cranial Nerves: No cranial nerve deficit.     Coordination: Coordination normal.     Gait: Gait normal.  Psychiatric:        Mood and Affect: Mood normal.        Behavior: Behavior normal.       Assessment/Plan: 1. Essential hypertension Well controlled with current medications  2. OSA on CPAP Doing well with CPAP  3. Stomatitis and mucositis Need refill for mouthwash, has dental procedures coming up in the next month or 2.  - chlorhexidine (PERIDEX) 0.12 % solution; Use as directed 5 mLs in the mouth or throat 2 (two) times daily.  Dispense: 473 mL; Refill: 1   General Counseling: Alle verbalizes understanding of the findings of todays visit and agrees with plan of treatment. I have discussed  any further diagnostic evaluation that may be needed or ordered today. We also reviewed her medications today. she has been encouraged to call the office with any questions or concerns that should arise related to todays visit.    No orders of the defined types were placed in this encounter.   Meds ordered this encounter  Medications   chlorhexidine (PERIDEX) 0.12 % solution    Sig: Use as directed 5 mLs in the mouth or throat 2 (two) times daily.    Dispense:  473 mL    Refill:  1    No follow-ups on file.   Total time spent:20 Minutes Time spent includes review of chart, medications, test results, and follow up plan with the patient.   [place] Controlled Substance Database was reviewed by me.  This patient was seen by Jonetta Osgood, FNP-C in collaboration with Dr. Clayborn Bigness as a part of collaborative care agreement.   Annlouise Gerety R. Valetta Fuller, MSN, FNP-C Internal medicine

## 2021-06-03 NOTE — Anesthesia Preprocedure Evaluation (Addendum)
Anesthesia Evaluation  Patient identified by MRN, date of birth, ID band Patient awake    Reviewed: Allergy & Precautions, NPO status , Patient's Chart, lab work & pertinent test results  History of Anesthesia Complications Negative for: history of anesthetic complications  Airway Mallampati: III   Neck ROM: Full    Dental  (+)    Pulmonary sleep apnea and Continuous Positive Airway Pressure Ventilation ,    Pulmonary exam normal breath sounds clear to auscultation       Cardiovascular hypertension, Normal cardiovascular exam Rhythm:Regular Rate:Normal     Neuro/Psych negative neurological ROS     GI/Hepatic negative GI ROS,   Endo/Other  Hypothyroidism Class 3 obesity  Renal/GU negative Renal ROS     Musculoskeletal  (+) Arthritis ,   Abdominal   Peds  Hematology negative hematology ROS (+)   Anesthesia Other Findings   Reproductive/Obstetrics                            Anesthesia Physical Anesthesia Plan  ASA: 3  Anesthesia Plan: MAC   Post-op Pain Management:    Induction: Intravenous  PONV Risk Score and Plan: 2 and TIVA, Midazolam and Treatment may vary due to age or medical condition  Airway Management Planned: Nasal Cannula  Additional Equipment:   Intra-op Plan:   Post-operative Plan:   Informed Consent: I have reviewed the patients History and Physical, chart, labs and discussed the procedure including the risks, benefits and alternatives for the proposed anesthesia with the patient or authorized representative who has indicated his/her understanding and acceptance.       Plan Discussed with: CRNA  Anesthesia Plan Comments:        Anesthesia Quick Evaluation

## 2021-06-04 NOTE — Discharge Instructions (Signed)

## 2021-06-05 ENCOUNTER — Ambulatory Visit
Admission: RE | Admit: 2021-06-05 | Discharge: 2021-06-05 | Disposition: A | Discharge status: Home / Self Care | Payer: Medicare Other | Source: Home / Self Care | Attending: Ophthalmology | Admitting: Ophthalmology

## 2021-06-05 ENCOUNTER — Ambulatory Visit: Payer: Medicare Other | Admitting: Anesthesiology

## 2021-06-05 ENCOUNTER — Other Ambulatory Visit: Payer: Self-pay

## 2021-06-05 ENCOUNTER — Encounter
Admission: RE | Disposition: A | Discharge status: Home / Self Care | Payer: Self-pay | Source: Home / Self Care | Attending: Ophthalmology

## 2021-06-05 ENCOUNTER — Encounter: Payer: Self-pay | Admitting: Ophthalmology

## 2021-06-05 DIAGNOSIS — D72829 Elevated white blood cell count, unspecified: Secondary | ICD-10-CM | POA: Diagnosis not present

## 2021-06-05 DIAGNOSIS — Z8619 Personal history of other infectious and parasitic diseases: Secondary | ICD-10-CM | POA: Insufficient documentation

## 2021-06-05 DIAGNOSIS — I10 Essential (primary) hypertension: Secondary | ICD-10-CM | POA: Insufficient documentation

## 2021-06-05 DIAGNOSIS — Z882 Allergy status to sulfonamides status: Secondary | ICD-10-CM | POA: Diagnosis not present

## 2021-06-05 DIAGNOSIS — H25811 Combined forms of age-related cataract, right eye: Secondary | ICD-10-CM | POA: Diagnosis not present

## 2021-06-05 DIAGNOSIS — I452 Bifascicular block: Secondary | ICD-10-CM | POA: Diagnosis not present

## 2021-06-05 DIAGNOSIS — Z6841 Body Mass Index (BMI) 40.0 and over, adult: Secondary | ICD-10-CM | POA: Diagnosis not present

## 2021-06-05 DIAGNOSIS — Z880 Allergy status to penicillin: Secondary | ICD-10-CM | POA: Insufficient documentation

## 2021-06-05 DIAGNOSIS — K219 Gastro-esophageal reflux disease without esophagitis: Secondary | ICD-10-CM | POA: Diagnosis not present

## 2021-06-05 DIAGNOSIS — Z8759 Personal history of other complications of pregnancy, childbirth and the puerperium: Secondary | ICD-10-CM | POA: Insufficient documentation

## 2021-06-05 DIAGNOSIS — Z886 Allergy status to analgesic agent status: Secondary | ICD-10-CM | POA: Insufficient documentation

## 2021-06-05 DIAGNOSIS — Z8719 Personal history of other diseases of the digestive system: Secondary | ICD-10-CM | POA: Insufficient documentation

## 2021-06-05 DIAGNOSIS — Z79890 Hormone replacement therapy (postmenopausal): Secondary | ICD-10-CM | POA: Insufficient documentation

## 2021-06-05 DIAGNOSIS — I213 ST elevation (STEMI) myocardial infarction of unspecified site: Secondary | ICD-10-CM | POA: Diagnosis not present

## 2021-06-05 DIAGNOSIS — Z888 Allergy status to other drugs, medicaments and biological substances status: Secondary | ICD-10-CM | POA: Insufficient documentation

## 2021-06-05 DIAGNOSIS — M1990 Unspecified osteoarthritis, unspecified site: Secondary | ICD-10-CM | POA: Diagnosis not present

## 2021-06-05 DIAGNOSIS — Z79899 Other long term (current) drug therapy: Secondary | ICD-10-CM | POA: Insufficient documentation

## 2021-06-05 DIAGNOSIS — R0600 Dyspnea, unspecified: Secondary | ICD-10-CM | POA: Diagnosis not present

## 2021-06-05 DIAGNOSIS — H2511 Age-related nuclear cataract, right eye: Secondary | ICD-10-CM | POA: Insufficient documentation

## 2021-06-05 DIAGNOSIS — Z20822 Contact with and (suspected) exposure to covid-19: Secondary | ICD-10-CM | POA: Diagnosis not present

## 2021-06-05 DIAGNOSIS — G4733 Obstructive sleep apnea (adult) (pediatric): Secondary | ICD-10-CM | POA: Diagnosis not present

## 2021-06-05 DIAGNOSIS — I219 Acute myocardial infarction, unspecified: Secondary | ICD-10-CM | POA: Diagnosis not present

## 2021-06-05 DIAGNOSIS — Z881 Allergy status to other antibiotic agents status: Secondary | ICD-10-CM | POA: Insufficient documentation

## 2021-06-05 DIAGNOSIS — G4730 Sleep apnea, unspecified: Secondary | ICD-10-CM | POA: Insufficient documentation

## 2021-06-05 DIAGNOSIS — I517 Cardiomegaly: Secondary | ICD-10-CM | POA: Diagnosis not present

## 2021-06-05 DIAGNOSIS — Z91041 Radiographic dye allergy status: Secondary | ICD-10-CM | POA: Insufficient documentation

## 2021-06-05 DIAGNOSIS — E039 Hypothyroidism, unspecified: Secondary | ICD-10-CM | POA: Insufficient documentation

## 2021-06-05 DIAGNOSIS — Z831 Family history of other infectious and parasitic diseases: Secondary | ICD-10-CM | POA: Insufficient documentation

## 2021-06-05 DIAGNOSIS — E785 Hyperlipidemia, unspecified: Secondary | ICD-10-CM | POA: Diagnosis not present

## 2021-06-05 DIAGNOSIS — R079 Chest pain, unspecified: Secondary | ICD-10-CM | POA: Diagnosis not present

## 2021-06-05 DIAGNOSIS — I2109 ST elevation (STEMI) myocardial infarction involving other coronary artery of anterior wall: Secondary | ICD-10-CM | POA: Diagnosis not present

## 2021-06-05 DIAGNOSIS — E669 Obesity, unspecified: Secondary | ICD-10-CM | POA: Diagnosis present

## 2021-06-05 DIAGNOSIS — E1136 Type 2 diabetes mellitus with diabetic cataract: Secondary | ICD-10-CM | POA: Diagnosis not present

## 2021-06-05 HISTORY — DX: Unspecified osteoarthritis, unspecified site: M19.90

## 2021-06-05 HISTORY — DX: Cardiomegaly: I51.7

## 2021-06-05 HISTORY — DX: Gastro-esophageal reflux disease without esophagitis: K21.9

## 2021-06-05 HISTORY — DX: Bradycardia, unspecified: R00.1

## 2021-06-05 HISTORY — PX: CATARACT EXTRACTION W/PHACO: SHX586

## 2021-06-05 HISTORY — DX: Sleep apnea, unspecified: G47.30

## 2021-06-05 SURGERY — PHACOEMULSIFICATION, CATARACT, WITH IOL INSERTION
Anesthesia: Monitor Anesthesia Care | Site: Eye | Laterality: Right

## 2021-06-05 MED ORDER — SIGHTPATH DOSE#1 BSS IO SOLN
INTRAOCULAR | Status: DC | PRN
  Administered 2021-06-05: 15 mL

## 2021-06-05 MED ORDER — PHENYLEPHRINE HCL 10 % OP SOLN
1.0000 [drp] | OPHTHALMIC | Status: DC | PRN
  Administered 2021-06-05 (×3): 1 [drp] via OPHTHALMIC

## 2021-06-05 MED ORDER — ACETAMINOPHEN 160 MG/5ML PO SOLN: 325.0000 mg | ORAL | Status: DC | PRN

## 2021-06-05 MED ORDER — LACTATED RINGERS IV SOLN: INTRAVENOUS | Status: DC

## 2021-06-05 MED ORDER — SIGHTPATH DOSE#1 BSS IO SOLN
INTRAOCULAR | Status: DC | PRN
  Administered 2021-06-05: 61 mL via OPHTHALMIC

## 2021-06-05 MED ORDER — BRIMONIDINE TARTRATE-TIMOLOL 0.2-0.5 % OP SOLN
OPHTHALMIC | Status: DC | PRN
  Administered 2021-06-05: 1 [drp] via OPHTHALMIC

## 2021-06-05 MED ORDER — SIGHTPATH DOSE#1 BSS IO SOLN
INTRAOCULAR | Status: DC | PRN
  Administered 2021-06-05: 1 mL via INTRAMUSCULAR

## 2021-06-05 MED ORDER — ACETAMINOPHEN 325 MG PO TABS: 650.0000 mg | ORAL_TABLET | Freq: Once | ORAL | Status: DC | PRN

## 2021-06-05 MED ORDER — CYCLOPENTOLATE HCL 2 % OP SOLN
1.0000 [drp] | OPHTHALMIC | Status: DC | PRN
  Administered 2021-06-05 (×3): 1 [drp] via OPHTHALMIC

## 2021-06-05 MED ORDER — FENTANYL CITRATE (PF) 100 MCG/2ML IJ SOLN
INTRAMUSCULAR | Status: DC | PRN
  Administered 2021-06-05: 50 ug via INTRAVENOUS

## 2021-06-05 MED ORDER — ONDANSETRON HCL 4 MG/2ML IJ SOLN: 4.0000 mg | Freq: Once | INTRAMUSCULAR | Status: DC | PRN

## 2021-06-05 MED ORDER — MOXIFLOXACIN HCL 0.5 % OP SOLN
OPHTHALMIC | Status: DC | PRN
  Administered 2021-06-05: 0.2 mL via OPHTHALMIC

## 2021-06-05 MED ORDER — TETRACAINE HCL 0.5 % OP SOLN
1.0000 [drp] | OPHTHALMIC | Status: DC | PRN
  Administered 2021-06-05 (×3): 1 [drp] via OPHTHALMIC

## 2021-06-05 MED ORDER — MIDAZOLAM HCL 2 MG/2ML IJ SOLN
INTRAMUSCULAR | Status: DC | PRN
  Administered 2021-06-05: 1.5 mg via INTRAVENOUS

## 2021-06-05 MED ORDER — SIGHTPATH DOSE#1 NA HYALUR & NA CHOND-NA HYALUR IO KIT
PACK | INTRAOCULAR | Status: DC | PRN
  Administered 2021-06-05: 1 via OPHTHALMIC

## 2021-06-05 SURGICAL SUPPLY — 16 items
CANNULA ANT/CHMB 27GA (MISCELLANEOUS) ×2 IMPLANT
GLOVE SRG 8 PF TXTR STRL LF DI (GLOVE) ×1 IMPLANT
GLOVE SURG ENC TEXT LTX SZ7.5 (GLOVE) ×2 IMPLANT
GLOVE SURG UNDER POLY LF SZ8 (GLOVE) ×2
GOWN STRL REUS W/ TWL LRG LVL3 (GOWN DISPOSABLE) ×2 IMPLANT
GOWN STRL REUS W/TWL LRG LVL3 (GOWN DISPOSABLE) ×4
LENS IOL TECNIS EYHANCE 21.0 (Intraocular Lens) ×2 IMPLANT
MARKER SKIN DUAL TIP RULER LAB (MISCELLANEOUS) ×2 IMPLANT
NEEDLE CAPSULORHEX 25GA (NEEDLE) ×2 IMPLANT
NEEDLE FILTER BLUNT 18X 1/2SAF (NEEDLE) ×2
NEEDLE FILTER BLUNT 18X1 1/2 (NEEDLE) ×2 IMPLANT
PACK EYE AFTER SURG (MISCELLANEOUS) ×2 IMPLANT
SYR 3ML LL SCALE MARK (SYRINGE) ×4 IMPLANT
SYR TB 1ML LUER SLIP (SYRINGE) ×2 IMPLANT
WATER STERILE IRR 250ML POUR (IV SOLUTION) ×2 IMPLANT
WIPE NON LINTING 3.25X3.25 (MISCELLANEOUS) ×2 IMPLANT

## 2021-06-05 NOTE — Transfer of Care (Signed)
Immediate Anesthesia Transfer of Care Note  Patient: Claudia Martin  Procedure(s) Performed: CATARACT EXTRACTION PHACO AND INTRAOCULAR LENS PLACEMENT ([facility]) RIGHT (Right: Eye)  Patient Location: PACU  Anesthesia Type: MAC  Level of Consciousness: awake, alert  and patient cooperative  Airway and Oxygen Therapy: Patient Spontanous Breathing and Patient connected to supplemental oxygen  Post-op Assessment: Post-op Vital signs reviewed, Patient's Cardiovascular Status Stable, Respiratory Function Stable, Patent Airway and No signs of Nausea or vomiting  Post-op Vital Signs: Reviewed and stable  Complications: No notable events documented.

## 2021-06-05 NOTE — H&P (Signed)
[place]   Primary Care Physician:  Lavera Guise, MD Ophthalmologist: Dr. Leandrew Koyanagi  Pre-Procedure History & Physical: HPI:  Claudia Martin is a 75 y.o. female here for ophthalmic surgery.   Past Medical History:  Diagnosis Date   Abdominal pain    Allergic rhinitis    Arthritis    Candidiasis of vulva and vagina    Cellulitis and abscess    Diverticulosis    Dysuria    Enlarged heart    Eyelid inflammation    GERD (gastroesophageal reflux disease)    Hematuria syndrome    Hemorrhoids    Herpes genitalis    Herpes simplex    HTN (hypertension)    Hydronephrosis    Hypothyroidism    Incomplete bladder emptying    Internal hordeolum    Knee pain    Otalgia    Primary ovarian failure    Sciatica    Shortness of breath    Sinus bradycardia    Sleep apnea    uses CPAP   Urethral diverticulum    UTI (lower urinary tract infection)    Vaginal atrophy    Vaginitis     Past Surgical History:  Procedure Laterality Date   BREAST BIOPSY     COLON SURGERY     COLONOSCOPY WITH PROPOFOL N/A 03/15/2018   Procedure: COLONOSCOPY WITH PROPOFOL;  Surgeon: Lin Landsman, MD;  Location: [facility] ENDOSCOPY;  Service: Gastroenterology;  Laterality: N/A;   COLONOSCOPY WITH PROPOFOL N/A 01/09/2021   Procedure: COLONOSCOPY WITH PROPOFOL;  Surgeon: Lin Landsman, MD;  Location: [facility] ENDOSCOPY;  Service: Gastroenterology;  Laterality: N/A;   diverticulitis     ECTOPIC PREGNANCY SURGERY     fx thumb     HERNIA REPAIR      Prior to Admission medications   Medication Sig Start Date End Date Taking? Authorizing Provider  amLODipine (NORVASC) 2.5 MG tablet Take 2 tablets (5 mg total) by mouth daily. 12/17/20  Yes Lavera Guise, MD  atenolol (TENORMIN) 25 MG tablet TAKE 1 AND 1/2 TABLETS BY MOUTH TWICE DAILY 02/27/21  Yes Lavera Guise, MD  cetirizine (ZYRTEC) 10 MG tablet Take 10 mg by mouth daily.   Yes [provider]  levothyroxine  (SYNTHROID) 50 MCG tablet Take 1 tablet (50 mcg total) by mouth daily before breakfast. 02/27/21  Yes Lavera Guise, MD  rosuvastatin (CRESTOR) 5 MG tablet Take 1 tablet (5 mg total) by mouth daily. 11/30/20  Yes Luiz Ochoa, NP  valACYclovir (VALTREX) 1000 MG tablet Take 1 tablet (1,000 mg total) by mouth daily. Pt can 1 tablet twice a day if flare up for 7 days. 02/27/21  Yes Lavera Guise, MD  chlorhexidine (PERIDEX) 0.12 % solution Use as directed 5 mLs in the mouth or throat 2 (two) times daily. 05/31/21   Jonetta Osgood, NP  conjugated estrogens (PREMARIN) vaginal cream Use 1 applicatorful twice weekly. 05/22/21   Lavera Guise, MD  EPINEPHrine 0.3 mg/0.3 mL IJ SOAJ injection as needed. 08/24/14   [provider]  Zoster Vaccine Adjuvanted [facility]) injection Inject 0.5 mLs into the muscle once.    [provider]    Allergies as of 05/16/2021 - Review Complete 04/11/2021  Allergen Reaction Noted   Other Diarrhea and Hives 06/26/2015   Augmentin [amoxicillin-pot clavulanate]  12/07/2019   Bactrim [sulfamethoxazole-trimethoprim] Hives 12/23/2018   Ibuprofen  07/16/2015   Ivp dye [iodinated diagnostic agents] Diarrhea and Hives 06/26/2015   Macrobid [  nitrofurantoin]  12/31/2018   Prednisone Nausea Only 06/26/2015    Family History  Problem Relation Age of Onset   Tuberculosis Mother    Nephrolithiasis Brother     Social History   Socioeconomic History   Marital status: Divorced    Spouse name: Not on file   Number of children: Not on file   Years of education: Not on file   Highest education level: Not on file  Occupational History   Not on file  Tobacco Use   Smoking status: Never   Smokeless tobacco: Never  Vaping Use   Vaping Use: Never used  Substance and Sexual Activity   Alcohol use: No   Drug use: Never   Sexual activity: Not on file  Other Topics Concern   Not on file  Social History Narrative   Not on file   Social Determinants of  Health   Financial Resource Strain: Not on file  Food Insecurity: Not on file  Transportation Needs: Not on file  Physical Activity: Not on file  Stress: Not on file  Social Connections: Not on file  Intimate Partner Violence: Not on file    Review of Systems: See HPI, otherwise negative ROS  Physical Exam: BP (!) 152/72   Pulse 64   Temp 97.9 F (36.6 C) (Temporal)   Ht '4\' 9"'$  (1.448 m)   Wt 80.7 kg   SpO2 95%   BMI 41.37 kg/m  General:   Alert,  pleasant and cooperative in NAD Head:  Normocephalic and atraumatic. Lungs:  Clear to auscultation.    Heart:  Regular rate and rhythm.   Impression/Plan: Claudia Martin is here for ophthalmic surgery.  Risks, benefits, limitations, and alternatives regarding ophthalmic surgery have been reviewed with the patient.  Questions have been answered.  All parties agreeable.   Leandrew Koyanagi, MD  06/05/2021, 8:39 AM

## 2021-06-05 NOTE — Anesthesia Postprocedure Evaluation (Signed)
Anesthesia Post Note  Patient: Claudia Martin  Procedure(s) Performed: CATARACT EXTRACTION PHACO AND INTRAOCULAR LENS PLACEMENT ([facility]) RIGHT (Right: Eye)     Patient location during evaluation: PACU Anesthesia Type: MAC Level of consciousness: awake and alert, oriented and patient cooperative Pain management: pain level controlled Vital Signs Assessment: post-procedure vital signs reviewed and stable Respiratory status: spontaneous breathing, nonlabored ventilation and respiratory function stable Cardiovascular status: blood pressure returned to baseline and stable Postop Assessment: adequate PO intake Anesthetic complications: no   No notable events documented.  Darrin Nipper

## 2021-06-05 NOTE — Op Note (Signed)
LOCATION:  Claudia Martin   PREOPERATIVE DIAGNOSIS:    Nuclear sclerotic cataract right eye. H25.11   POSTOPERATIVE DIAGNOSIS:  Nuclear sclerotic cataract right eye.     PROCEDURE:  Phacoemusification with posterior chamber intraocular lens placement of the right eye   ULTRASOUND TIME: Procedure(s) with comments: CATARACT EXTRACTION PHACO AND INTRAOCULAR LENS PLACEMENT ([facility]) RIGHT (Right) - 3.02 00:39.5  LENS:   Implant Name Type Inv. Item Serial No. Manufacturer Lot No. LRB No. Used Action  LENS IOL TECNIS EYHANCE 21.0 - [id number] Intraocular Lens LENS IOL TECNIS EYHANCE 21.0 [id number]   Right 1 Implanted         SURGEON:  Wyonia Hough, MD   ANESTHESIA:  Topical with tetracaine drops and 2% Xylocaine jelly, augmented with 1% preservative-free intracameral lidocaine.    COMPLICATIONS:  None.   DESCRIPTION OF PROCEDURE:  The patient was identified in the holding room and transported to the operating room and placed in the supine position under the operating microscope.  The right eye was identified as the operative eye and it was prepped and draped in the usual sterile ophthalmic fashion.   A 1 millimeter clear-corneal paracentesis was made at the 12:00 position.  0.5 ml of preservative-free 1% lidocaine was injected into the anterior chamber. The anterior chamber was filled with Viscoat viscoelastic.  A 2.4 millimeter keratome was used to make a near-clear corneal incision at the 9:00 position.  A curvilinear capsulorrhexis was made with a cystotome and capsulorrhexis forceps.  Balanced salt solution was used to hydrodissect and hydrodelineate the nucleus.   Phacoemulsification was then used in stop and chop fashion to remove the lens nucleus and epinucleus.  The remaining cortex was then removed using the irrigation and aspiration handpiece. Provisc was then placed into the capsular bag to distend it for lens placement.  A lens was then injected into the  capsular bag.  The remaining viscoelastic was aspirated.   Wounds were hydrated with balanced salt solution.  The anterior chamber was inflated to a physiologic pressure with balanced salt solution.  No wound leaks were noted. Vigamox 0.2 ml of a '1mg'$  per ml solution was injected into the anterior chamber for a dose of 0.2 mg of intracameral antibiotic at the completion of the case.   Timolol and Brimonidine drops were applied to the eye.  The patient was taken to the recovery room in stable condition without complications of anesthesia or surgery.   Claudia Martin 06/05/2021, 9:37 AM

## 2021-06-05 NOTE — Anesthesia Procedure Notes (Signed)
Procedure Name: MAC Date/Time: 06/05/2021 9:19 AM Performed by: Mayme Genta, CRNA Pre-anesthesia Checklist: Patient identified, Emergency Drugs available, Suction available, Timeout performed and Patient being monitored Patient Re-evaluated:Patient Re-evaluated prior to induction Oxygen Delivery Method: Nasal cannula Placement Confirmation: positive ETCO2

## 2021-06-06 ENCOUNTER — Emergency Department: Payer: Medicare Other

## 2021-06-06 ENCOUNTER — Other Ambulatory Visit: Payer: Self-pay

## 2021-06-06 ENCOUNTER — Inpatient Hospital Stay
Admission: EM | Admit: 2021-06-06 | Discharge: 2021-06-09 | DRG: 247 | Disposition: A | Discharge status: Home / Self Care | Payer: Medicare Other | Attending: Internal Medicine | Admitting: Internal Medicine

## 2021-06-06 ENCOUNTER — Encounter: Payer: Self-pay | Admitting: Ophthalmology

## 2021-06-06 ENCOUNTER — Encounter
Admission: EM | Disposition: A | Discharge status: Home / Self Care | Payer: Self-pay | Source: Home / Self Care | Attending: Internal Medicine

## 2021-06-06 DIAGNOSIS — Z881 Allergy status to other antibiotic agents status: Secondary | ICD-10-CM

## 2021-06-06 DIAGNOSIS — D72829 Elevated white blood cell count, unspecified: Secondary | ICD-10-CM | POA: Diagnosis not present

## 2021-06-06 DIAGNOSIS — E1136 Type 2 diabetes mellitus with diabetic cataract: Secondary | ICD-10-CM | POA: Diagnosis present

## 2021-06-06 DIAGNOSIS — G4733 Obstructive sleep apnea (adult) (pediatric): Secondary | ICD-10-CM | POA: Diagnosis present

## 2021-06-06 DIAGNOSIS — Z882 Allergy status to sulfonamides status: Secondary | ICD-10-CM | POA: Diagnosis not present

## 2021-06-06 DIAGNOSIS — Z831 Family history of other infectious and parasitic diseases: Secondary | ICD-10-CM

## 2021-06-06 DIAGNOSIS — I2510 Atherosclerotic heart disease of native coronary artery without angina pectoris: Secondary | ICD-10-CM

## 2021-06-06 DIAGNOSIS — E669 Obesity, unspecified: Secondary | ICD-10-CM | POA: Diagnosis present

## 2021-06-06 DIAGNOSIS — K219 Gastro-esophageal reflux disease without esophagitis: Secondary | ICD-10-CM | POA: Diagnosis present

## 2021-06-06 DIAGNOSIS — I213 ST elevation (STEMI) myocardial infarction of unspecified site: Secondary | ICD-10-CM

## 2021-06-06 DIAGNOSIS — R0600 Dyspnea, unspecified: Secondary | ICD-10-CM | POA: Diagnosis not present

## 2021-06-06 DIAGNOSIS — I10 Essential (primary) hypertension: Secondary | ICD-10-CM | POA: Diagnosis present

## 2021-06-06 DIAGNOSIS — E785 Hyperlipidemia, unspecified: Secondary | ICD-10-CM | POA: Diagnosis present

## 2021-06-06 DIAGNOSIS — I452 Bifascicular block: Secondary | ICD-10-CM | POA: Diagnosis present

## 2021-06-06 DIAGNOSIS — Z79890 Hormone replacement therapy (postmenopausal): Secondary | ICD-10-CM | POA: Diagnosis not present

## 2021-06-06 DIAGNOSIS — Z888 Allergy status to other drugs, medicaments and biological substances status: Secondary | ICD-10-CM | POA: Diagnosis not present

## 2021-06-06 DIAGNOSIS — Z6841 Body Mass Index (BMI) 40.0 and over, adult: Secondary | ICD-10-CM

## 2021-06-06 DIAGNOSIS — H2511 Age-related nuclear cataract, right eye: Secondary | ICD-10-CM | POA: Diagnosis present

## 2021-06-06 DIAGNOSIS — I219 Acute myocardial infarction, unspecified: Secondary | ICD-10-CM | POA: Diagnosis not present

## 2021-06-06 DIAGNOSIS — Z79899 Other long term (current) drug therapy: Secondary | ICD-10-CM | POA: Diagnosis not present

## 2021-06-06 DIAGNOSIS — I2109 ST elevation (STEMI) myocardial infarction involving other coronary artery of anterior wall: Principal | ICD-10-CM | POA: Diagnosis present

## 2021-06-06 DIAGNOSIS — Z20822 Contact with and (suspected) exposure to covid-19: Secondary | ICD-10-CM | POA: Diagnosis present

## 2021-06-06 DIAGNOSIS — I517 Cardiomegaly: Secondary | ICD-10-CM | POA: Diagnosis not present

## 2021-06-06 DIAGNOSIS — E039 Hypothyroidism, unspecified: Secondary | ICD-10-CM | POA: Diagnosis present

## 2021-06-06 DIAGNOSIS — M1990 Unspecified osteoarthritis, unspecified site: Secondary | ICD-10-CM | POA: Diagnosis present

## 2021-06-06 DIAGNOSIS — R079 Chest pain, unspecified: Secondary | ICD-10-CM | POA: Diagnosis not present

## 2021-06-06 HISTORY — PX: LEFT HEART CATH AND CORONARY ANGIOGRAPHY: CATH118249

## 2021-06-06 HISTORY — DX: Atherosclerotic heart disease of native coronary artery without angina pectoris: I25.10

## 2021-06-06 HISTORY — PX: CORONARY/GRAFT ACUTE MI REVASCULARIZATION: CATH118305

## 2021-06-06 HISTORY — DX: ST elevation (STEMI) myocardial infarction of unspecified site: I21.3

## 2021-06-06 LAB — COMPREHENSIVE METABOLIC PANEL
ALT: 17 U/L (ref 0–44)
AST: 46 U/L — ABNORMAL HIGH (ref 15–41)
Albumin: 4.2 g/dL (ref 3.5–5.0)
Alkaline Phosphatase: 94 U/L (ref 38–126)
Anion gap: 9 (ref 5–15)
BUN: 15 mg/dL (ref 8–23)
CO2: 26 mmol/L (ref 22–32)
Calcium: 9.1 mg/dL (ref 8.9–10.3)
Chloride: 102 mmol/L (ref 98–111)
Creatinine, Ser: 0.9 mg/dL (ref 0.44–1.00)
GFR, Estimated: >60 mL/min (ref >60)
Glucose, Bld: 202 mg/dL — ABNORMAL HIGH (ref 70–99)
Potassium: 3.3 mmol/L — ABNORMAL LOW (ref 3.5–5.1)
Sodium: 137 mmol/L (ref 135–145)
Total Bilirubin: 0.7 mg/dL (ref 0.3–1.2)
Total Protein: 8.4 g/dL — ABNORMAL HIGH (ref 6.5–8.1)

## 2021-06-06 LAB — CBC WITH DIFFERENTIAL/PLATELET
Abs Immature Granulocytes: 0.03 10*3/uL (ref 0.00–0.07)
Basophils Absolute: 0.1 10*3/uL (ref 0.0–0.1)
Basophils Relative: 1 %
Eosinophils Absolute: 0.1 10*3/uL (ref 0.0–0.5)
Eosinophils Relative: 1 %
HCT: 42.4 % (ref 36.0–46.0)
Hemoglobin: 15 g/dL (ref 12.0–15.0)
Immature Granulocytes: 0 %
Lymphocytes Relative: 14 %
Lymphs Abs: 1.5 10*3/uL (ref 0.7–4.0)
MCH: 31.6 pg (ref 26.0–34.0)
MCHC: 35.4 g/dL (ref 30.0–36.0)
MCV: 89.5 fL (ref 80.0–100.0)
Monocytes Absolute: 0.3 10*3/uL (ref 0.1–1.0)
Monocytes Relative: 3 %
Neutro Abs: 8.7 10*3/uL — ABNORMAL HIGH (ref 1.7–7.7)
Neutrophils Relative %: 81 %
Platelets: 241 10*3/uL (ref 150–400)
RBC: 4.74 MIL/uL (ref 3.87–5.11)
RDW: 12.5 % (ref 11.5–15.5)
WBC: 10.6 10*3/uL — ABNORMAL HIGH (ref 4.0–10.5)
nRBC: 0 % (ref 0.0–0.2)

## 2021-06-06 LAB — LIPID PANEL
Cholesterol: 129 mg/dL (ref 0–200)
HDL: 39 mg/dL — ABNORMAL LOW (ref >40)
LDL Cholesterol: 73 mg/dL (ref 0–99)
Total CHOL/HDL Ratio: 3.3 RATIO
Triglycerides: 85 mg/dL (ref <150)
VLDL: 17 mg/dL (ref 0–40)

## 2021-06-06 LAB — POCT ACTIVATED CLOTTING TIME: Activated Clotting Time: 428 seconds

## 2021-06-06 LAB — RESP PANEL BY RT-PCR (FLU A&B, COVID) ARPGX2
Influenza A by PCR: NEGATIVE
Influenza B by PCR: NEGATIVE
SARS Coronavirus 2 by RT PCR: NEGATIVE

## 2021-06-06 LAB — APTT: aPTT: 36 seconds (ref 24–36)

## 2021-06-06 LAB — GLUCOSE, CAPILLARY: Glucose-Capillary: 172 mg/dL — ABNORMAL HIGH (ref 70–99)

## 2021-06-06 LAB — PROTIME-INR
INR: 1 (ref 0.8–1.2)
Prothrombin Time: 13.1 seconds (ref 11.4–15.2)

## 2021-06-06 LAB — TROPONIN I (HIGH SENSITIVITY): Troponin I (High Sensitivity): 1228 ng/L (ref <18)

## 2021-06-06 SURGERY — CORONARY/GRAFT ACUTE MI REVASCULARIZATION
Anesthesia: Moderate Sedation

## 2021-06-06 MED ORDER — LIDOCAINE HCL 1 % IJ SOLN
INTRAMUSCULAR | Status: AC
  Filled 2021-06-06: qty 20

## 2021-06-06 MED ORDER — HEPARIN SODIUM (PORCINE) 5000 UNIT/ML IJ SOLN
INTRAMUSCULAR | Status: AC
  Administered 2021-06-06: 4000 [IU] via INTRAVENOUS
  Filled 2021-06-06: qty 1

## 2021-06-06 MED ORDER — LABETALOL HCL 5 MG/ML IV SOLN: 10.0000 mg | INTRAVENOUS | Status: AC | PRN

## 2021-06-06 MED ORDER — IOHEXOL 350 MG/ML SOLN
INTRAVENOUS | Status: DC | PRN
  Administered 2021-06-06: 256 mL

## 2021-06-06 MED ORDER — CHLORHEXIDINE GLUCONATE CLOTH 2 % EX PADS
6.0000 | MEDICATED_PAD | Freq: Every day | CUTANEOUS | Status: DC
  Administered 2021-06-06 – 2021-06-08 (×2): 6 via TOPICAL

## 2021-06-06 MED ORDER — MIDAZOLAM HCL 2 MG/2ML IJ SOLN
INTRAMUSCULAR | Status: AC
  Filled 2021-06-06: qty 2

## 2021-06-06 MED ORDER — METHYLPREDNISOLONE SODIUM SUCC 125 MG IJ SOLR
INTRAMUSCULAR | Status: DC | PRN
Start: 2021-06-06 — End: 2021-06-06
  Administered 2021-06-06: 125 mg via INTRAVENOUS

## 2021-06-06 MED ORDER — ASPIRIN 81 MG PO CHEW
81.0000 mg | CHEWABLE_TABLET | Freq: Every day | ORAL | Status: DC
  Administered 2021-06-07 – 2021-06-09 (×3): 81 mg via ORAL
  Filled 2021-06-06 (×3): qty 1

## 2021-06-06 MED ORDER — BIVALIRUDIN TRIFLUOROACETATE 250 MG IV SOLR
INTRAVENOUS | Status: AC
  Filled 2021-06-06: qty 250

## 2021-06-06 MED ORDER — ATORVASTATIN CALCIUM 80 MG PO TABS
80.0000 mg | ORAL_TABLET | Freq: Every day | ORAL | Status: DC
  Administered 2021-06-07 – 2021-06-09 (×3): 80 mg via ORAL
  Filled 2021-06-06: qty 1
  Filled 2021-06-06 (×2): qty 4

## 2021-06-06 MED ORDER — ONDANSETRON HCL 4 MG/2ML IJ SOLN
4.0000 mg | Freq: Four times a day (QID) | INTRAMUSCULAR | Status: DC | PRN
  Administered 2021-06-06: 4 mg via INTRAVENOUS

## 2021-06-06 MED ORDER — SODIUM CHLORIDE 0.9 % IV SOLN
INTRAVENOUS | Status: DC | PRN
  Administered 2021-06-06: 1.75 mg/kg/h via INTRAVENOUS

## 2021-06-06 MED ORDER — SODIUM CHLORIDE 0.9% FLUSH: 3.0000 mL | INTRAVENOUS | Status: DC | PRN

## 2021-06-06 MED ORDER — ACETAMINOPHEN 325 MG PO TABS: 650.0000 mg | ORAL_TABLET | ORAL | Status: DC | PRN

## 2021-06-06 MED ORDER — HEPARIN SODIUM (PORCINE) 1000 UNIT/ML IJ SOLN
INTRAMUSCULAR | Status: AC
  Filled 2021-06-06: qty 1

## 2021-06-06 MED ORDER — OXYCODONE HCL 5 MG PO TABS: 5.0000 mg | ORAL_TABLET | ORAL | Status: DC | PRN

## 2021-06-06 MED ORDER — IOHEXOL 300 MG/ML  SOLN: INTRAMUSCULAR | Status: DC | PRN

## 2021-06-06 MED ORDER — ASPIRIN 81 MG PO CHEW: 324.0000 mg | CHEWABLE_TABLET | Freq: Once | ORAL | Status: AC

## 2021-06-06 MED ORDER — SODIUM CHLORIDE 0.9 % WEIGHT BASED INFUSION
1.0000 mL/kg/h | INTRAVENOUS | Status: AC
  Administered 2021-06-06: 1 mL/kg/h via INTRAVENOUS

## 2021-06-06 MED ORDER — SODIUM CHLORIDE 0.9% FLUSH
3.0000 mL | Freq: Two times a day (BID) | INTRAVENOUS | Status: DC
  Administered 2021-06-07 – 2021-06-08 (×4): 3 mL via INTRAVENOUS

## 2021-06-06 MED ORDER — METOPROLOL TARTRATE 25 MG PO TABS
25.0000 mg | ORAL_TABLET | Freq: Two times a day (BID) | ORAL | Status: DC
  Administered 2021-06-06 – 2021-06-09 (×6): 25 mg via ORAL
  Filled 2021-06-06 (×6): qty 1

## 2021-06-06 MED ORDER — BIVALIRUDIN BOLUS VIA INFUSION - CUPID
INTRAVENOUS | Status: DC | PRN
  Administered 2021-06-06: 60.9 mg via INTRAVENOUS

## 2021-06-06 MED ORDER — VERAPAMIL HCL 2.5 MG/ML IV SOLN
INTRAVENOUS | Status: AC
  Filled 2021-06-06: qty 2

## 2021-06-06 MED ORDER — HEPARIN SODIUM (PORCINE) 5000 UNIT/ML IJ SOLN: 4000.0000 [IU] | Freq: Once | INTRAMUSCULAR | Status: AC

## 2021-06-06 MED ORDER — METHYLPREDNISOLONE SODIUM SUCC 125 MG IJ SOLR
INTRAMUSCULAR | Status: AC
  Filled 2021-06-06: qty 2

## 2021-06-06 MED ORDER — LIDOCAINE HCL (PF) 1 % IJ SOLN
INTRAMUSCULAR | Status: DC | PRN
  Administered 2021-06-06: 8 mL

## 2021-06-06 MED ORDER — NITROGLYCERIN 0.4 MG SL SUBL
0.4000 mg | SUBLINGUAL_TABLET | SUBLINGUAL | Status: DC | PRN
  Administered 2021-06-06 (×3): 0.4 mg via SUBLINGUAL

## 2021-06-06 MED ORDER — HEPARIN (PORCINE) IN NACL 1000-0.9 UT/500ML-% IV SOLN
INTRAVENOUS | Status: AC
  Filled 2021-06-06: qty 1000

## 2021-06-06 MED ORDER — FENTANYL CITRATE PF 50 MCG/ML IJ SOSY
PREFILLED_SYRINGE | INTRAMUSCULAR | Status: AC
  Filled 2021-06-06: qty 1

## 2021-06-06 MED ORDER — TICAGRELOR 90 MG PO TABS
ORAL_TABLET | ORAL | Status: DC | PRN
  Administered 2021-06-06: 180 mg via ORAL

## 2021-06-06 MED ORDER — TICAGRELOR 90 MG PO TABS
ORAL_TABLET | ORAL | Status: AC
  Filled 2021-06-06: qty 1

## 2021-06-06 MED ORDER — ASPIRIN 81 MG PO CHEW
CHEWABLE_TABLET | ORAL | Status: AC
  Administered 2021-06-06: 324 mg via ORAL
  Filled 2021-06-06: qty 4

## 2021-06-06 MED ORDER — DIPHENHYDRAMINE HCL 50 MG/ML IJ SOLN
INTRAMUSCULAR | Status: DC | PRN
  Administered 2021-06-06: 50 mg via INTRAVENOUS

## 2021-06-06 MED ORDER — HEPARIN (PORCINE) IN NACL 2000-0.9 UNIT/L-% IV SOLN
INTRAVENOUS | Status: DC | PRN
  Administered 2021-06-06: 1000 mL

## 2021-06-06 MED ORDER — DIPHENHYDRAMINE HCL 50 MG/ML IJ SOLN
INTRAMUSCULAR | Status: AC
  Filled 2021-06-06: qty 1

## 2021-06-06 MED ORDER — MIDAZOLAM HCL 2 MG/2ML IJ SOLN
INTRAMUSCULAR | Status: DC | PRN
  Administered 2021-06-06: 1 mg via INTRAVENOUS

## 2021-06-06 MED ORDER — LOSARTAN POTASSIUM 25 MG PO TABS
25.0000 mg | ORAL_TABLET | Freq: Every day | ORAL | Status: DC
  Administered 2021-06-07 – 2021-06-09 (×3): 25 mg via ORAL
  Filled 2021-06-06 (×3): qty 1

## 2021-06-06 MED ORDER — SODIUM CHLORIDE 0.9 % IV SOLN: 250.0000 mL | INTRAVENOUS | Status: DC | PRN

## 2021-06-06 MED ORDER — FENTANYL CITRATE (PF) 100 MCG/2ML IJ SOLN
INTRAMUSCULAR | Status: DC | PRN
  Administered 2021-06-06 (×2): 25 ug via INTRAVENOUS

## 2021-06-06 MED ORDER — TICAGRELOR 90 MG PO TABS
90.0000 mg | ORAL_TABLET | Freq: Two times a day (BID) | ORAL | Status: DC
  Administered 2021-06-07 – 2021-06-09 (×5): 90 mg via ORAL
  Filled 2021-06-06 (×5): qty 1

## 2021-06-06 MED ORDER — SODIUM CHLORIDE 0.9 % IV SOLN: 0.2500 mg/kg/h | INTRAVENOUS | Status: DC

## 2021-06-06 MED ORDER — NITROGLYCERIN 0.4 MG SL SUBL
SUBLINGUAL_TABLET | SUBLINGUAL | Status: AC
  Filled 2021-06-06: qty 3

## 2021-06-06 MED ORDER — SODIUM CHLORIDE 0.9 % IV SOLN: INTRAVENOUS | Status: DC

## 2021-06-06 MED ORDER — HYDRALAZINE HCL 20 MG/ML IJ SOLN: 10.0000 mg | INTRAMUSCULAR | Status: AC | PRN

## 2021-06-06 SURGICAL SUPPLY — 20 items
BALLN TREK RX 2.5X12 (BALLOONS) ×2
BALLN ~~LOC~~ TREK RX 2.5X12 (BALLOONS) ×2
BALLOON TREK RX 2.5X12 (BALLOONS) ×1 IMPLANT
BALLOON ~~LOC~~ TREK RX 2.5X12 (BALLOONS) ×1 IMPLANT
CATH INFINITI JR4 5F (CATHETERS) ×2 IMPLANT
CATH VISTA GUIDE 6FR XB3 (CATHETERS) ×2 IMPLANT
CATH VISTA GUIDE 6FR XB3.5 (CATHETERS) ×2 IMPLANT
DEVICE CLOSURE MYNXGRIP 6/7F (Vascular Products) ×2 IMPLANT
DRAPE BRACHIAL (DRAPES) ×2 IMPLANT
KIT ENCORE 26 ADVANTAGE (KITS) ×2 IMPLANT
NEEDLE PERC 18GX7CM (NEEDLE) ×2 IMPLANT
PACK CARDIAC CATH (CUSTOM PROCEDURE TRAY) ×2 IMPLANT
PROTECTION STATION PRESSURIZED (MISCELLANEOUS) ×2
SET ATX SIMPLICITY (MISCELLANEOUS) ×2 IMPLANT
SHEATH AVANTI 6FR X 11CM (SHEATH) ×2 IMPLANT
STATION PROTECTION PRESSURIZED (MISCELLANEOUS) ×1 IMPLANT
STENT ONYX FRONTIER 2.5X18 (Permanent Stent) ×2 IMPLANT
TUBING CIL FLEX 10 FLL-RA (TUBING) ×2 IMPLANT
WIRE G HI TQ BMW 190 (WIRE) ×2 IMPLANT
WIRE GUIDERIGHT .035X150 (WIRE) ×2 IMPLANT

## 2021-06-06 NOTE — Progress Notes (Signed)
[place] Progress Note Patient Name: Elyzah Heldenbrand DOB: [birth year] MRN: [id number]   Date of Service  06/06/2021  HPI/Events of Note  Patient admitted with STEMI s/p PCI and stenting, hemodynamically stable, on STEMI protocol medications.  eICU Interventions  New Patient Evaluation.        Kerry Kass Aurelie Dicenzo 06/06/2021, 11:18 PM

## 2021-06-06 NOTE — CV Procedure (Signed)
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

## 2021-06-06 NOTE — ED Provider Notes (Addendum)
[facility]  ____________________________________________   Event Date/Time   First MD Initiated Contact with Patient 06/06/21 2023     (approximate)  I have reviewed the triage vital signs and the nursing notes.   HISTORY  Chief Complaint Chest Pain    HPI Claudia Martin is a 75 y.o. female With past medical history of hypertension, hyperlipidemia, GERD who presents with chest pain.  Symptoms started around 5 PM after she was pushing some boxes.  She endorses pressure-like pain in the right side of her chest radiating around to the right back.  Associated dyspnea but no nausea or diaphoresis.  She denies history of similar.  No prior cardiac history.  Denies fevers chills or abdominal pain.        Past Medical History:  Diagnosis Date   Abdominal pain    Allergic rhinitis    Arthritis    Candidiasis of vulva and vagina    Cellulitis and abscess    Diverticulosis    Dysuria    Enlarged heart    Eyelid inflammation    GERD (gastroesophageal reflux disease)    Hematuria syndrome    Hemorrhoids    Herpes genitalis    Herpes simplex    HTN (hypertension)    Hydronephrosis    Hypothyroidism    Incomplete bladder emptying    Internal hordeolum    Knee pain    Otalgia    Primary ovarian failure    Sciatica    Shortness of breath    Sinus bradycardia    Sleep apnea    uses CPAP   Urethral diverticulum    UTI (lower urinary tract infection)    Vaginal atrophy    Vaginitis     Patient Active Problem List   Diagnosis Date Noted   History of colonic polyps    Urinary tract infection without hematuria 01/04/2020   Candidiasis of skin 01/04/2020   Ventral hernia without obstruction or gangrene 11/28/2019   Herpes simplex infection 10/27/2019   Stomatitis and mucositis 10/27/2019   Encounter for general adult medical examination with abnormal findings 10/27/2019   Vitamin B12 deficiency 06/30/2018   Unspecified menopausal and  perimenopausal disorder 06/30/2018   Vitamin D deficiency 06/30/2018   Flu vaccine need 06/30/2018   Urinary tract infection with hematuria 02/05/2018   Acquired hypothyroidism 02/05/2018   Essential hypertension 02/05/2018   Screening for breast cancer 02/05/2018   Dysuria 02/05/2018   Obstructive sleep apnea (adult) (pediatric) 01/06/2018    Past Surgical History:  Procedure Laterality Date   BREAST BIOPSY     CATARACT EXTRACTION W/PHACO Right 06/05/2021   Procedure: CATARACT EXTRACTION PHACO AND INTRAOCULAR LENS PLACEMENT ([place] [place]) RIGHT;  Surgeon: Leandrew Koyanagi, MD;  Location: [place];  Service: Ophthalmology;  Laterality: Right;  3.02 00:39.5   COLON SURGERY     COLONOSCOPY WITH PROPOFOL N/A 03/15/2018   Procedure: COLONOSCOPY WITH PROPOFOL;  Surgeon: Lin Landsman, MD;  Location: [facility] ENDOSCOPY;  Service: Gastroenterology;  Laterality: N/A;   COLONOSCOPY WITH PROPOFOL N/A 01/09/2021   Procedure: COLONOSCOPY WITH PROPOFOL;  Surgeon: Lin Landsman, MD;  Location: [facility] ENDOSCOPY;  Service: Gastroenterology;  Laterality: N/A;   diverticulitis     ECTOPIC PREGNANCY SURGERY     fx thumb     HERNIA REPAIR      Prior to Admission medications   Medication Sig Start Date End Date Taking? Authorizing Provider  amLODipine (NORVASC) 2.5 MG tablet Take 2 tablets (5 mg total) by  mouth daily. 12/17/20   Lavera Guise, MD  atenolol (TENORMIN) 25 MG tablet TAKE 1 AND 1/2 TABLETS BY MOUTH TWICE DAILY 02/27/21   Lavera Guise, MD  cetirizine (ZYRTEC) 10 MG tablet Take 10 mg by mouth daily.    [provider]  chlorhexidine (PERIDEX) 0.12 % solution Use as directed 5 mLs in the mouth or throat 2 (two) times daily. 05/31/21   Jonetta Osgood, NP  conjugated estrogens (PREMARIN) vaginal cream Use 1 applicatorful twice weekly. 05/22/21   Lavera Guise, MD  EPINEPHrine 0.3 mg/0.3 mL IJ SOAJ injection as needed. 08/24/14   [provider]  levothyroxine  (SYNTHROID) 50 MCG tablet Take 1 tablet (50 mcg total) by mouth daily before breakfast. 02/27/21   Lavera Guise, MD  rosuvastatin (CRESTOR) 5 MG tablet Take 1 tablet (5 mg total) by mouth daily. 11/30/20   Luiz Ochoa, NP  valACYclovir (VALTREX) 1000 MG tablet Take 1 tablet (1,000 mg total) by mouth daily. Pt can 1 tablet twice a day if flare up for 7 days. 02/27/21   Lavera Guise, MD  Zoster Vaccine Adjuvanted [facility]) injection Inject 0.5 mLs into the muscle once.    [provider]    Allergies Other, Augmentin [amoxicillin-pot clavulanate], Bactrim [sulfamethoxazole-trimethoprim], Ibuprofen, Ivp dye [iodinated diagnostic agents], Macrobid [nitrofurantoin], and Prednisone  Family History  Problem Relation Age of Onset   Tuberculosis Mother    Nephrolithiasis Brother     Social History Social History   Tobacco Use   Smoking status: Never   Smokeless tobacco: Never  Vaping Use   Vaping Use: Never used  Substance Use Topics   Alcohol use: No   Drug use: Never    Review of Systems   Review of Systems  Constitutional:  Negative for chills and fever.  Respiratory:  Positive for shortness of breath.   Cardiovascular:  Positive for chest pain.  Gastrointestinal:  Negative for abdominal pain, nausea and vomiting.  All other systems reviewed and are negative.  Physical Exam Updated Vital Signs BP 127/90   Pulse 76   Temp 98.2 F (36.8 C)   Resp 18   Ht '4\' 7"'$  (1.397 m)   Wt 81.2 kg   SpO2 96%   BMI 41.60 kg/m   Physical Exam Vitals and nursing note reviewed.  Constitutional:      General: She is in acute distress.     Appearance: Normal appearance.     Comments: Patient is distress, appears in pain  HENT:     Head: Normocephalic and atraumatic.  Eyes:     General: No scleral icterus.    Conjunctiva/sclera: Conjunctivae normal.  Cardiovascular:     Rate and Rhythm: Normal rate and regular rhythm.  Pulmonary:     Effort: Pulmonary effort is normal.  No respiratory distress.     Breath sounds: No stridor.  Abdominal:     Palpations: Abdomen is soft. There is no mass.     Tenderness: There is no abdominal tenderness. There is no guarding.  Musculoskeletal:        General: No deformity or signs of injury.     Cervical back: Normal range of motion.     Right lower leg: No edema.     Left lower leg: No edema.  Skin:    General: Skin is dry.     Coloration: Skin is not jaundiced or pale.  Neurological:     General: No focal deficit present.  Mental Status: She is alert and oriented to person, place, and time. Mental status is at baseline.  Psychiatric:        Mood and Affect: Mood normal.        Behavior: Behavior normal.     LABS (all labs ordered are listed, but only abnormal results are displayed)  Labs Reviewed  CBC WITH DIFFERENTIAL/PLATELET - Abnormal; Notable for the following components:      Result Value   WBC 10.6 (*)    Neutro Abs 8.7 (*)    All other components within normal limits  RESP PANEL BY RT-PCR (FLU A&B, COVID) ARPGX2  PROTIME-INR  APTT  HEMOGLOBIN A1C  COMPREHENSIVE METABOLIC PANEL  LIPID PANEL  TROPONIN I (HIGH SENSITIVITY)   ____________________________________________  EKG  Right bundle branch block, left anterior fascicular block, normal sinus rhythm, ST elevation in 1 and aVL with reciprocal depression in 2 3 and aVF  RADIOLOGY I, Madelin Headings, personally viewed and evaluated these images (plain radiographs) as part of my medical decision making, as well as reviewing the written report by the radiologist.  ED MD interpretation: I reviewed the chest x-ray which shows some increased interstitial markings    ____________________________________________   PROCEDURES  Procedure(s) performed (including Critical Care):  .Critical Care  Date/Time: 06/06/2021 11:37 PM Performed by: Rada Hay, MD Authorized by: Rada Hay, MD   Critical care provider statement:     Critical care time (minutes):  60   Critical care time was exclusive of:  Separately billable procedures and treating other patients   Critical care was necessary to treat or prevent imminent or life-threatening deterioration of the following conditions:  Cardiac failure   Critical care was time spent personally by me on the following activities:  Discussions with consultants, evaluation of patient's response to treatment, examination of patient, obtaining history from patient or surrogate, ordering and performing treatments and interventions, ordering and review of radiographic studies, ordering and review of laboratory studies, pulse oximetry and re-evaluation of patient's condition   I assumed direction of critical care for this patient from another provider in my specialty: no     ____________________________________________   INITIAL IMPRESSION / [place] / ED COURSE     Patient is a 75 year old female presents with concerning chest pain since 5 PM.  On arrival her EKG in triage is concerning for an acute lateral STEMI and her symptomatology is fading.  Vital signs within normal limits.  She received aspirin, heparin bolus and sublingual nitro with some relief of her pain.  STEMI alert was called.  Spoke with Dr. Clayborn Bigness.        ____________________________________________   FINAL CLINICAL IMPRESSION(S) / ED DIAGNOSES  Final diagnoses:  ST elevation myocardial infarction (STEMI), unspecified artery [facility])     ED Discharge Orders     None        Note:  This document was prepared using Dragon voice recognition software and may include unintentional dictation errors.    Rada Hay, MD 06/06/21 2119    Rada Hay, MD 06/06/21 2337

## 2021-06-06 NOTE — ED Triage Notes (Addendum)
Pt in with co chest pain that started at 1700 today that goes through to back. Pt states pain started after moving a box but pain is constant. Does not worsen with movement or inspiration. PT had cataract surgery yesterday.

## 2021-06-06 NOTE — H&P (Addendum)
History and Physical    Claudia Martin [id number] DOB: Sep 07, 1946 DOA: 06/06/2021  PCP: Lavera Guise, MD  Patient coming from: home  I have personally briefly reviewed patient's old medical records in [place]  Chief Complaint: chest pain   HPI: Claudia Martin is a 75 y.o. female with medical history significant of obesity, HTN, HLD,hypothyroid,osa on cpap,GERD who presents to ed with chest pain ongoing for close to 5 hours that started after strenuous activity. She describes pain as pressure like sensation located in left chest with radiation to her back . Patient denied any associated symptoms of sob,palpitations,n/v/diaphoresis or presyncope. On evaluation in ED patient found to have ST elevation in in 1,AVL  with depression in inferior leads with noted RBBB and LAFB. CODE STEMI was called and patient was taken to cath lab.  Patient on cardiac cath was found to have  an occluded ramus She had successful PCI and stent 100% occluded ramus. Patient was at then transferred to ICU and admitted to hospitalist service. Per patient she notes no current chest pain , sob, n/v or abdominal pain.  ED Course:  As noted above  Afeb, hr 79, rr 18, sat 100% on ra Wbc: 10.6, hgb 15, plt241,  inr 1 Covid:neg NA137, K 3.3, gly 202, ast 46 C4e 1228 Lipid panel :tc128,  tg85, hld 39 Cxr: Cardiomegaly with vascular congestion. No focal consolidation. Review of Systems: As per HPI otherwise 10 point review of systems negative.   Past Medical History:  Diagnosis Date   Abdominal pain    Allergic rhinitis    Arthritis    Candidiasis of vulva and vagina    Cellulitis and abscess    Diverticulosis    Dysuria    Enlarged heart    Eyelid inflammation    GERD (gastroesophageal reflux disease)    Hematuria syndrome    Hemorrhoids    Herpes genitalis    Herpes simplex    HTN (hypertension)    Hydronephrosis    Hypothyroidism    Incomplete bladder emptying    Internal  hordeolum    Knee pain    Otalgia    Primary ovarian failure    Sciatica    Shortness of breath    Sinus bradycardia    Sleep apnea    uses CPAP   Urethral diverticulum    UTI (lower urinary tract infection)    Vaginal atrophy    Vaginitis     Past Surgical History:  Procedure Laterality Date   BREAST BIOPSY     CATARACT EXTRACTION W/PHACO Right 06/05/2021   Procedure: CATARACT EXTRACTION PHACO AND INTRAOCULAR LENS PLACEMENT ([place]) RIGHT;  Surgeon: Leandrew Koyanagi, MD;  Location: [place];  Service: Ophthalmology;  Laterality: Right;  3.02 00:39.5   COLON SURGERY     COLONOSCOPY WITH PROPOFOL N/A 03/15/2018   Procedure: COLONOSCOPY WITH PROPOFOL;  Surgeon: Lin Landsman, MD;  Location[facility] ENDOSCOPY;  Service: Gastroenterology;  Laterality: N/A;   COLONOSCOPY WITH PROPOFOL N/A 01/09/2021   Procedure: COLONOSCOPY WITH PROPOFOL;  Surgeon: Lin Landsman, MD;  Location: [facility] ENDOSCOPY;  Service: Gastroenterology;  Laterality: N/A;   diverticulitis     ECTOPIC PREGNANCY SURGERY     fx thumb     HERNIA REPAIR       reports that she has never smoked. She has never used smokeless tobacco. She reports that she does not drink alcohol and does not use drugs.  Allergies  Allergen Reactions   Other  Diarrhea and Hives   Augmentin [Amoxicillin-Pot Clavulanate]    Bactrim [Sulfamethoxazole-Trimethoprim] Hives   Ibuprofen     Other reaction(s): Unknown   Ivp Dye [Iodinated Diagnostic Agents] Diarrhea and Hives   Macrobid [Nitrofurantoin]    Prednisone Nausea Only    Other reaction(s): Dizziness    Family History  Problem Relation Age of Onset   Tuberculosis Mother    Nephrolithiasis Brother     Prior to Admission medications   Medication Sig Start Date End Date Taking? Authorizing Provider  amLODipine (NORVASC) 2.5 MG tablet Take 2 tablets (5 mg total) by mouth daily. 12/17/20   Lavera Guise, MD  atenolol (TENORMIN) 25 MG tablet TAKE 1 AND 1/2 TABLETS BY  MOUTH TWICE DAILY 02/27/21   Lavera Guise, MD  cetirizine (ZYRTEC) 10 MG tablet Take 10 mg by mouth daily.    [provider]  chlorhexidine (PERIDEX) 0.12 % solution Use as directed 5 mLs in the mouth or throat 2 (two) times daily. 05/31/21   Jonetta Osgood, NP  conjugated estrogens (PREMARIN) vaginal cream Use 1 applicatorful twice weekly. 05/22/21   Lavera Guise, MD  EPINEPHrine 0.3 mg/0.3 mL IJ SOAJ injection as needed. 08/24/14   [provider]  levothyroxine (SYNTHROID) 50 MCG tablet Take 1 tablet (50 mcg total) by mouth daily before breakfast. 02/27/21   Lavera Guise, MD  rosuvastatin (CRESTOR) 5 MG tablet Take 1 tablet (5 mg total) by mouth daily. 11/30/20   Luiz Ochoa, NP  valACYclovir (VALTREX) 1000 MG tablet Take 1 tablet (1,000 mg total) by mouth daily. Pt can 1 tablet twice a day if flare up for 7 days. 02/27/21   Lavera Guise, MD  Zoster Vaccine Adjuvanted [facility]) injection Inject 0.5 mLs into the muscle once.    [provider]    Physical Exam: Vitals:   06/06/21 2033 06/06/21 2047 06/06/21 2255 06/06/21 2300  BP: 137/85 127/90  (!) 152/91  Pulse: 81 76  79  Resp: 18 18 (!) 22 (!) 27  Temp:    97.6 F (36.4 C)  TempSrc:    Oral  SpO2: 96% 96%  96%  Weight:   83.8 kg   Height:   '4\' 7"'$  (1.397 m)      Vitals:   06/06/21 2033 06/06/21 2047 06/06/21 2255 06/06/21 2300  BP: 137/85 127/90  (!) 152/91  Pulse: 81 76  79  Resp: 18 18 (!) 22 (!) 27  Temp:    97.6 F (36.4 C)  TempSrc:    Oral  SpO2: 96% 96%  96%  Weight:   83.8 kg   Height:   '4\' 7"'$  (1.397 m)   Constitutional: NAD, calm, comfortable Eyes: PERRL, lids and conjunctivae normal ENMT: Mucous membranes are moist. Posterior pharynx clear of any exudate or lesions.Normal dentition.  Neck: normal, supple, no masses, no thyromegaly Respiratory: clear to auscultation bilaterally, no wheezing, no crackles. Normal respiratory effort. No accessory muscle use.  Cardiovascular:  Regular rate and rhythm, no murmurs / rubs / gallops. No extremity edema. 2+ pedal pulses. Abdomen: obese no tenderness, no masses palpated. No hepatosplenomegaly. Bowel sounds positive.  Musculoskeletal: no clubbing / cyanosis. No joint deformity upper and lower extremities. Good ROM, no contractures. Normal muscle tone.  Skin: no rashes, lesions, ulcers. No induration Neurologic: CN 2-12 grossly intact. Sensation intact,  Strength 5/5 in all 4.  Psychiatric: Normal judgment and insight. Alert and oriented x 3. Normal mood.    Labs on Admission: I  have personally reviewed following labs and imaging studies  CBC: Recent Labs  Lab 06/06/21 2021  WBC 10.6*  NEUTROABS 8.7*  HGB 15.0  HCT 42.4  MCV 89.5  PLT [id number]   Basic Metabolic Panel: Recent Labs  Lab 06/06/21 2021  NA 137  K 3.3*  CL 102  CO2 26  GLUCOSE 202*  BUN 15  CREATININE 0.90  CALCIUM 9.1   GFR: Estimated Creatinine Clearance: 46.7 mL/min (by C-G formula based on SCr of 0.9 mg/dL). Liver Function Tests: Recent Labs  Lab 06/06/21 2021  AST 46*  ALT 17  ALKPHOS 94  BILITOT 0.7  PROT 8.4*  ALBUMIN 4.2   No results for input(s): LIPASE, AMYLASE in the last 168 hours. No results for input(s): AMMONIA in the last 168 hours. Coagulation Profile: Recent Labs  Lab 06/06/21 2021  INR 1.0   Cardiac Enzymes: No results for input(s): CKTOTAL, CKMB, CKMBINDEX, TROPONINI in the last 168 hours. BNP (last 3 results) No results for input(s): PROBNP in the last 8760 hours. HbA1C: No results for input(s): HGBA1C in the last 72 hours. CBG: Recent Labs  Lab 06/06/21 2257  GLUCAP 172*   Lipid Profile: Recent Labs    06/06/21 2021  CHOL 129  HDL 39*  LDLCALC 73  TRIG 85  CHOLHDL 3.3   Thyroid Function Tests: No results for input(s): TSH, T4TOTAL, FREET4, T3FREE, THYROIDAB in the last 72 hours. Anemia Panel: No results for input(s): VITAMINB12, FOLATE, FERRITIN, TIBC, IRON, RETICCTPCT in the last 72  hours. Urine analysis:    Component Value Date/Time   APPEARANCEUR Clear 11/30/2020 1130   GLUCOSEU Negative 11/30/2020 1130   BILIRUBINUR Negative 02/27/2021 1506   BILIRUBINUR Negative 11/30/2020 1130   PROTEINUR Positive (A) 02/27/2021 1506   PROTEINUR Trace 11/30/2020 1130   UROBILINOGEN 0.2 02/27/2021 1506   NITRITE Negative 02/27/2021 1506   NITRITE Negative 11/30/2020 1130   LEUKOCYTESUR Moderate (2+) (A) 02/27/2021 1506   LEUKOCYTESUR Trace (A) 11/30/2020 1130    Radiological Exams on Admission: DG Chest Port 1 View  Result Date: 06/06/2021 CLINICAL DATA:  Chest pain. EXAM: PORTABLE CHEST 1 VIEW COMPARISON:  Chest radiograph dated 06/26/2015 FINDINGS: Cardiomegaly with vascular congestion. No focal consolidation, pleural effusion, or pneumothorax. Degenerative changes of the spine. No acute osseous pathology. IMPRESSION: Cardiomegaly with vascular congestion. No focal consolidation. Electronically Signed   By: Anner Crete M.D.   On: 06/06/2021 21:03    EKG: Independently reviewed. See above  Assessment/Plan  STEMI an occluded ramus -100% occluded ramus -s/p cath with stent placement  -cont  asa, statin,metoprolol, brillinta -ef preserved per cath , full echo ordered for am  -f/u cardiology recs     HTN -stable on metoprolol and cozaar   HLD -continue statin   Hypothyroid -continue synthroid -check tsh  Osa on cpap  Gerd -ppi    DVT prophylaxis: scd Code Status: full Family Communication: n/a Disposition Plan: patient  expected to be admitted greater than 2 midnights  Consults called: [place] MD /cardiology Admission status: inpatient   Clance Boll MD [facility]   If 7PM-7AM, please contact night-coverage [email or web address] Password [email or web address]  06/06/2021, 11:40 PM

## 2021-06-07 ENCOUNTER — Inpatient Hospital Stay
Admit: 2021-06-07 | Discharge: 2021-06-07 | Disposition: A | Discharge status: Home / Self Care | Payer: Medicare Other | Attending: Internal Medicine | Admitting: Internal Medicine

## 2021-06-07 ENCOUNTER — Encounter: Payer: Self-pay | Admitting: Internal Medicine

## 2021-06-07 DIAGNOSIS — I10 Essential (primary) hypertension: Secondary | ICD-10-CM

## 2021-06-07 DIAGNOSIS — E039 Hypothyroidism, unspecified: Secondary | ICD-10-CM

## 2021-06-07 DIAGNOSIS — E785 Hyperlipidemia, unspecified: Secondary | ICD-10-CM | POA: Diagnosis not present

## 2021-06-07 DIAGNOSIS — I213 ST elevation (STEMI) myocardial infarction of unspecified site: Secondary | ICD-10-CM | POA: Diagnosis not present

## 2021-06-07 DIAGNOSIS — I5189 Other ill-defined heart diseases: Secondary | ICD-10-CM

## 2021-06-07 DIAGNOSIS — R0600 Dyspnea, unspecified: Secondary | ICD-10-CM | POA: Diagnosis not present

## 2021-06-07 DIAGNOSIS — I219 Acute myocardial infarction, unspecified: Secondary | ICD-10-CM | POA: Diagnosis not present

## 2021-06-07 HISTORY — DX: Other ill-defined heart diseases: I51.89

## 2021-06-07 LAB — BASIC METABOLIC PANEL
Anion gap: 9 (ref 5–15)
BUN: 14 mg/dL (ref 8–23)
CO2: 23 mmol/L (ref 22–32)
Calcium: 8.9 mg/dL (ref 8.9–10.3)
Chloride: 103 mmol/L (ref 98–111)
Creatinine, Ser: 0.7 mg/dL (ref 0.44–1.00)
GFR, Estimated: >60 mL/min (ref >60)
Glucose, Bld: 209 mg/dL — ABNORMAL HIGH (ref 70–99)
Potassium: 3.5 mmol/L (ref 3.5–5.1)
Sodium: 135 mmol/L (ref 135–145)

## 2021-06-07 LAB — ECHOCARDIOGRAM COMPLETE
AR max vel: 2.11 cm2
AV Area VTI: 2.39 cm2
AV Area mean vel: 2.21 cm2
AV Mean grad: 2 mmHg
AV Peak grad: 5.4 mmHg
Ao pk vel: 1.16 m/s
Area-P 1/2: 6.77 cm2
Height: 55 in
MV VTI: 2.62 cm2
S' Lateral: 3.33 cm
Weight: 2955.93 oz

## 2021-06-07 LAB — CBC
HCT: 40.5 % (ref 36.0–46.0)
Hemoglobin: 14.4 g/dL (ref 12.0–15.0)
MCH: 32 pg (ref 26.0–34.0)
MCHC: 35.6 g/dL (ref 30.0–36.0)
MCV: 90 fL (ref 80.0–100.0)
Platelets: 218 10*3/uL (ref 150–400)
RBC: 4.5 MIL/uL (ref 3.87–5.11)
RDW: 12.6 % (ref 11.5–15.5)
WBC: 14.1 10*3/uL — ABNORMAL HIGH (ref 4.0–10.5)
nRBC: 0 % (ref 0.0–0.2)

## 2021-06-07 LAB — TROPONIN I (HIGH SENSITIVITY)
Troponin I (High Sensitivity): >24000 ng/L (ref <18)
Troponin I (High Sensitivity): >24000 ng/L (ref <18)

## 2021-06-07 LAB — HEMOGLOBIN A1C
Hgb A1c MFr Bld: 5.8 % — ABNORMAL HIGH (ref 4.8–5.6)
Mean Plasma Glucose: 119.76 mg/dL

## 2021-06-07 LAB — MRSA NEXT GEN BY PCR, NASAL: MRSA by PCR Next Gen: NOT DETECTED

## 2021-06-07 MED ORDER — NEPAFENAC 0.3 % OP SUSP
1.0000 [drp] | Freq: Every day | OPHTHALMIC | Status: DC
  Administered 2021-06-07 – 2021-06-09 (×3): 1 [drp] via OPHTHALMIC
  Filled 2021-06-07: qty 3

## 2021-06-07 MED ORDER — LEVOTHYROXINE SODIUM 50 MCG PO TABS
50.0000 ug | ORAL_TABLET | Freq: Every day | ORAL | Status: DC
  Administered 2021-06-07 – 2021-06-09 (×3): 50 ug via ORAL
  Filled 2021-06-07 (×3): qty 1

## 2021-06-07 MED ORDER — PREDNISOLONE ACETATE 1 % OP SUSP
1.0000 [drp] | Freq: Four times a day (QID) | OPHTHALMIC | Status: DC
  Administered 2021-06-07 – 2021-06-09 (×7): 1 [drp] via OPHTHALMIC
  Filled 2021-06-07: qty 1

## 2021-06-07 MED ORDER — NEPAFENAC 0.1 % OP SUSP
1.0000 [drp] | Freq: Every day | OPHTHALMIC | Status: DC
  Filled 2021-06-07: qty 3

## 2021-06-07 MED ORDER — MOXIFLOXACIN HCL 0.5 % OP SOLN
1.0000 [drp] | Freq: Every day | OPHTHALMIC | Status: DC
  Filled 2021-06-07: qty 3

## 2021-06-07 MED ORDER — MOXIFLOXACIN HCL 0.5 % OP SOLN
1.0000 [drp] | Freq: Every day | OPHTHALMIC | Status: DC
  Administered 2021-06-08 – 2021-06-09 (×2): 1 [drp] via OPHTHALMIC
  Filled 2021-06-07: qty 3

## 2021-06-07 MED ORDER — MOXIFLOXACIN HCL 0.5 % OP SOLN
1.0000 [drp] | Freq: Four times a day (QID) | OPHTHALMIC | Status: DC
  Administered 2021-06-07: 1 [drp] via OPHTHALMIC

## 2021-06-07 MED ORDER — PERFLUTREN LIPID MICROSPHERE
1.0000 mL | INTRAVENOUS | Status: AC | PRN
  Administered 2021-06-07: 4 mL via INTRAVENOUS
  Filled 2021-06-07: qty 10

## 2021-06-07 NOTE — Plan of Care (Signed)
  Problem: Clinical Measurements: Goal: Ability to maintain clinical measurements within normal limits will improve Outcome: Progressing Goal: Diagnostic test results will improve Outcome: Progressing Goal: Cardiovascular complication will be avoided Outcome: Progressing   Problem: Pain Managment: Goal: General experience of comfort will improve Outcome: Progressing

## 2021-06-07 NOTE — Progress Notes (Signed)
*  PRELIMINARY RESULTS* Echocardiogram 2D Echocardiogram has been performed.  Claudia Martin 06/07/2021, 1:05 PM

## 2021-06-07 NOTE — Progress Notes (Signed)
[facility] Cardiology    SUBJECTIVE: States to be doing okay denies any pain slept well no groin issues feels much better.  No shortness of breath no back pain   Vitals:   06/07/21 0300 06/07/21 0400 06/07/21 0500 06/07/21 0600  BP: 126/77 108/68 120/70 128/79  Pulse: 75 67 74 73  Resp: 16 18 (!) 23 19  Temp:      TempSrc:      SpO2: 97% 97% 96% 97%  Weight:      Height:         Intake/Output Summary (Last 24 hours) at 06/07/2021 0730 Last data filed at 06/07/2021 0600 Gross per 24 hour  Intake 613.7 ml  Output --  Net 613.7 ml      PHYSICAL EXAM  General: Well developed, well nourished, in no acute distress HEENT:  Normocephalic and atramatic Neck:  No JVD.  Lungs: Clear bilaterally to auscultation and percussion. Heart: HRRR . Normal S1 and S2 without gallops or murmurs.  Abdomen: Bowel sounds are positive, abdomen soft and non-tender  Msk:  Back normal, normal gait. Normal strength and tone for age. Extremities: No clubbing, cyanosis or edema.   Neuro: Alert and oriented X 3. Psych:  Good affect, responds appropriately   LABS: Basic Metabolic Panel: Recent Labs    06/06/21 2021 06/07/21 0031  NA 137 135  K 3.3* 3.5  CL 102 103  CO2 26 23  GLUCOSE 202* 209*  BUN 15 14  CREATININE 0.90 0.70  CALCIUM 9.1 8.9   Liver Function Tests: Recent Labs    06/06/21 2021  AST 46*  ALT 17  ALKPHOS 94  BILITOT 0.7  PROT 8.4*  ALBUMIN 4.2   No results for input(s): LIPASE, AMYLASE in the last 72 hours. CBC: Recent Labs    06/06/21 2021 06/07/21 0031  WBC 10.6* 14.1*  NEUTROABS 8.7*  --   HGB 15.0 14.4  HCT 42.4 40.5  MCV 89.5 90.0  PLT 241 218   Cardiac Enzymes: No results for input(s): CKTOTAL, CKMB, CKMBINDEX, TROPONINI in the last 72 hours. BNP: Invalid input(s): POCBNP D-Dimer: No results for input(s): DDIMER in the last 72 hours. Hemoglobin A1C: Recent Labs    06/06/21 2021  HGBA1C 5.8*   Fasting Lipid Panel: Recent Labs    06/06/21 2021   CHOL 129  HDL 39*  LDLCALC 73  TRIG 85  CHOLHDL 3.3   Thyroid Function Tests: No results for input(s): TSH, T4TOTAL, T3FREE, THYROIDAB in the last 72 hours.  Invalid input(s): FREET3 Anemia Panel: No results for input(s): VITAMINB12, FOLATE, FERRITIN, TIBC, IRON, RETICCTPCT in the last 72 hours.  DG Chest Port 1 View  Result Date: 06/06/2021 CLINICAL DATA:  Chest pain. EXAM: PORTABLE CHEST 1 VIEW COMPARISON:  Chest radiograph dated 06/26/2015 FINDINGS: Cardiomegaly with vascular congestion. No focal consolidation, pleural effusion, or pneumothorax. Degenerative changes of the spine. No acute osseous pathology. IMPRESSION: Cardiomegaly with vascular congestion. No focal consolidation. Electronically Signed   By: Anner Crete M.D.   On: 06/06/2021 21:03     Echo pending  TELEMETRY: Normal sinus rhythm rate of about 75 nonspecific ST-T wave changes:  ASSESSMENT AND PLAN:  Active Problems:   STEMI (ST elevation myocardial infarction) ([place]) Status post PCI and stent Elevated troponins Obesity Hypertension Hyperlipidemia Diabetes Obstructive sleep apnea . Plan Continue ICU level care Maintain aspirin Brilinta beta-blocker losartan statin We will probably switch back from Lipitor to high level Crestor therapy which she was on Will transition from metoprolol back  to Tenormin Continue CPAP for probable obstructive sleep apnea recommend weight loss Increase activity today up out of bed to chair with modest ambulation Follow-up EKGs Obtain echocardiogram today for left ventricular function normal     Yolonda Kida, MD, 06/07/2021 7:30 AM

## 2021-06-07 NOTE — Progress Notes (Signed)
PROGRESS NOTE    Claudia Martin  IRC:[id number] DOB: Oct 20, 1945 DOA: 06/06/2021 PCP: Lavera Guise, MD    Brief Narrative:  [name] is a 75 y.o. female with medical history significant of obesity, HTN, HLD,hypothyroid,osa on cpap,GERD who presents to ed with chest pain ongoing for close to 5 hours that started after strenuous activity. She describes pain as pressure like sensation located in left chest with radiation to her back . Patient denied any associated symptoms of sob,palpitations,n/v/diaphoresis or presyncope. On evaluation in ED patient found to have ST elevation in in 1,AVL  with depression in inferior leads with noted RBBB and LAFB. CODE STEMI was called and patient was taken to cath lab.  Patient on cardiac cath was found to have  an occluded ramus She had successful PCI and stent 100% occluded ramus. Patient was at then transferred to ICU and admitted to hospitalist service. Per patient she notes no current chest pain , sob, n/v or abdominal pain.  9/2 status post cath.  Currently chest pain and shortness of breath free  Consultants:  Cardiology  Procedures: Cardiac cath  Antimicrobials:      Subjective: No chest pain, no shortness of breath, no dizziness or lightheadedness  Objective: Vitals:   06/07/21 0900 06/07/21 1100 06/07/21 1200 06/07/21 1300  BP: 133/90 129/76 123/81 118/86  Pulse: 71 80 86 87  Resp: (!) 23 (!) _0 Temp:      TempSrc:      SpO2: 91% 92% 97% 95%  Weight:      Height:        Intake/Output Summary (Last 24 hours) at 06/07/2021 1557 Last data filed at 06/07/2021 0900 Gross per 24 hour  Intake 613.7 ml  Output 850 ml  Net -236.3 ml   Filed Weights   06/06/21 2012 06/06/21 2255  Weight: 81.2 kg 83.8 kg    Examination:  General exam: Appears calm and comfortable  Respiratory system: Clear to auscultation. Respiratory effort normal. Cardiovascular system: S1 & S2 heard, RRR. No JVD, murmurs, rubs, gallops  or clicks.  Gastrointestinal system: Abdomen is nondistended, soft and nontender. No organomegaly or masses felt. Normal bowel sounds heard. Central nervous system: Alert and oriented. No focal neurological deficits. Extremities: Edema Psychiatry: Judgement and insight appear normal. Mood & affect appropriate.     Data Reviewed: I have personally reviewed following labs and imaging studies  CBC: Recent Labs  Lab 06/06/21 2021 06/07/21 0031  WBC 10.6* 14.1*  NEUTROABS 8.7*  --   HGB 15.0 14.4  HCT 42.4 40.5  MCV 89.5 90.0  PLT 241 510   Basic Metabolic Panel: Recent Labs  Lab 06/06/21 2021 06/07/21 0031  NA 137 135  K 3.3* 3.5  CL 102 103  CO2 26 23  GLUCOSE 202* 209*  BUN 15 14  CREATININE 0.90 0.70  CALCIUM 9.1 8.9   GFR: Estimated Creatinine Clearance: 52.5 mL/min (by C-G formula based on SCr of 0.7 mg/dL). Liver Function Tests: Recent Labs  Lab 06/06/21 2021  AST 46*  ALT 17  ALKPHOS 94  BILITOT 0.7  PROT 8.4*  ALBUMIN 4.2   No results for input(s): LIPASE, AMYLASE in the last 168 hours. No results for input(s): AMMONIA in the last 168 hours. Coagulation Profile: Recent Labs  Lab 06/06/21 2021  INR 1.0   Cardiac Enzymes: No results for input(s): CKTOTAL, CKMB, CKMBINDEX, TROPONINI in the last 168 hours. BNP (last 3 results) No results for input(s): PROBNP in the  last 8760 hours. HbA1C: Recent Labs    06/06/21 2021  HGBA1C 5.8*   CBG: Recent Labs  Lab 06/06/21 2257  GLUCAP 172*   Lipid Profile: Recent Labs    06/06/21 2021  CHOL 129  HDL 39*  LDLCALC 73  TRIG 85  CHOLHDL 3.3   Thyroid Function Tests: No results for input(s): TSH, T4TOTAL, FREET4, T3FREE, THYROIDAB in the last 72 hours. Anemia Panel: No results for input(s): VITAMINB12, FOLATE, FERRITIN, TIBC, IRON, RETICCTPCT in the last 72 hours. Sepsis Labs: No results for input(s): PROCALCITON, LATICACIDVEN in the last 168 hours.  Recent Results (from the past 240 hour(s))   Resp Panel by RT-PCR (Flu A&B, Covid) Nasopharyngeal Swab     Status: None   Collection Time: 06/06/21  8:21 PM   Specimen: Nasopharyngeal Swab; Nasopharyngeal(NP) swabs in vial transport medium  Result Value Ref Range Status   SARS Coronavirus 2 by RT PCR NEGATIVE NEGATIVE Final    Comment: (NOTE) SARS-CoV-2 target nucleic acids are NOT DETECTED.  The SARS-CoV-2 RNA is generally detectable in upper respiratory specimens during the acute phase of infection. The lowest concentration of SARS-CoV-2 viral copies this assay can detect is 138 copies/mL. A negative result does not preclude SARS-Cov-2 infection and should not be used as the sole basis for treatment or other patient management decisions. A negative result may occur with  improper specimen collection/handling, submission of specimen other than nasopharyngeal swab, presence of viral mutation(s) within the areas targeted by this assay, and inadequate number of viral copies(<138 copies/mL). A negative result must be combined with clinical observations, patient history, and epidemiological information. The expected result is Negative.  Fact Sheet for Patients:  EntrepreneurPulse.com.au  Fact Sheet for Healthcare Providers:  IncredibleEmployment.be  This test is no t yet approved or cleared by the [place] FDA and  has been authorized for detection and/or diagnosis of SARS-CoV-2 by FDA under an Emergency Use Authorization (EUA). This EUA will remain  in effect (meaning this test can be used) for the duration of the COVID-19 declaration under Section 564(b)(1) of the Act, 21 U.S.C.section 360bbb-3(b)(1), unless the authorization is terminated  or revoked sooner.       Influenza A by PCR NEGATIVE NEGATIVE Final   Influenza B by PCR NEGATIVE NEGATIVE Final    Comment: (NOTE) The Xpert Xpress SARS-CoV-2/FLU/RSV plus assay is intended as an aid in the diagnosis of influenza from  Nasopharyngeal swab specimens and should not be used as a sole basis for treatment. Nasal washings and aspirates are unacceptable for Xpert Xpress SARS-CoV-2/FLU/RSV testing.  Fact Sheet for Patients: EntrepreneurPulse.com.au  Fact Sheet for Healthcare Providers: IncredibleEmployment.be  This test is not yet approved or cleared by the [place] FDA and has been authorized for detection and/or diagnosis of SARS-CoV-2 by FDA under an Emergency Use Authorization (EUA). This EUA will remain in effect (meaning this test can be used) for the duration of the COVID-19 declaration under Section 564(b)(1) of the Act, 21 U.S.C. section 360bbb-3(b)(1), unless the authorization is terminated or revoked.  Performed at [facility], [address] [place], [place] [address]   MRSA Next Gen by PCR, Nasal     Status: None   Collection Time: 06/06/21 10:59 PM   Specimen: Nasal Mucosa; Nasal Swab  Result Value Ref Range Status   MRSA by PCR Next Gen NOT DETECTED NOT DETECTED Final    Comment: (NOTE) The GeneXpert MRSA Assay (FDA approved for NASAL specimens only), is one component of a  comprehensive MRSA colonization surveillance program. It is not intended to diagnose MRSA infection nor to guide or monitor treatment for MRSA infections. Test performance is not FDA approved in patients less than 27 years old. Performed at [facility], [address], [place], [place] [address]          Radiology Studies: CARDIAC CATHETERIZATION  Result Date: 06/07/2021   Colon Flattery Cx to Prox Cx lesion is 100% stenosed.   A drug-eluting stent was successfully placed using a STENT ONYX FRONTIER [id number].   Post intervention, there is a 0% residual stenosis.   The left ventricular systolic function is normal.   LV end diastolic pressure is mildly elevated.   The left ventricular ejection fraction is 55-65% by visual estimate. Conclusion STEMI presentation  anterior lateral Diagnostic cardiac cath Preserved left ventricular function anterior lateral hypo- Preserved left ventricular function of at least 55% Coronary Left main large relatively free of disease LAD anomalous off the right no significant disease Ramus 100% occluded ramus Circumflex moderate in size free of disease RCA relatively large free of disease Intervention Successful PCI and stent of proximal ramus/diagonal DES stent placed 2.5 x 18 mm Onyx frontier Postdilated to 2.5 stem 12 mm [place] trek to 16 atm TIMI-3 flow restored from TIMI 0 100% occlusion relieved down to 0% Minx deployed Patient transferred to ICU for recovery  DG Chest Port 1 View  Result Date: 06/06/2021 CLINICAL DATA:  Chest pain. EXAM: PORTABLE CHEST 1 VIEW COMPARISON:  Chest radiograph dated 06/26/2015 FINDINGS: Cardiomegaly with vascular congestion. No focal consolidation, pleural effusion, or pneumothorax. Degenerative changes of the spine. No acute osseous pathology. IMPRESSION: Cardiomegaly with vascular congestion. No focal consolidation. Electronically Signed   By: Anner Crete M.D.   On: 06/06/2021 21:03   ECHOCARDIOGRAM COMPLETE  Result Date: 06/07/2021    ECHOCARDIOGRAM REPORT   Patient Name:   Claudia Martin Date of Exam: 06/07/2021 Medical Rec #:  [id number]               Height:       55.0 in Accession #:    [id number]              Weight:       184.7 lb Date of Birth:  31-Mar-1946               BSA:          1.694 m Patient Age:    49 years                BP:           129/76 mmHg Patient Gender: F                       HR:           82 bpm. Exam Location:  [facility] Procedure: 2D Echo, Color Doppler, Cardiac Doppler and Intracardiac            Opacification Agent Indications:     I21.9 Acute myocardial infarction, unspecified  History:         Patient has no prior history of Echocardiogram examinations.                  Signs/Symptoms:Shortness of Breath; Risk Factors:Hypertension                  and Sleep Apnea.   Sonographer:     Charmayne Sheer Referring Phys:  [place]  CALLWOOD Diagnosing Phys: Neoma Laming  Sonographer Comments: Suboptimal apical window. IMPRESSIONS  1. Left ventricular ejection fraction, by estimation, is 55 to 60%. The left ventricle has normal function. The left ventricle has no regional wall motion abnormalities. Left ventricular diastolic parameters are consistent with Grade I diastolic dysfunction (impaired relaxation).  2. Right ventricular systolic function is normal. The right ventricular size is normal.  3. The mitral valve is degenerative. Trivial mitral valve regurgitation. No evidence of mitral stenosis.  4. The aortic valve is normal in structure. Aortic valve regurgitation is not visualized. Mild aortic valve sclerosis is present, with no evidence of aortic valve stenosis.  5. The inferior vena cava is normal in size with greater than 50% respiratory variability, suggesting right atrial pressure of 3 mmHg. FINDINGS  Left Ventricle: Left ventricular ejection fraction, by estimation, is 55 to 60%. The left ventricle has normal function. The left ventricle has no regional wall motion abnormalities. Definity contrast agent was given IV to delineate the left ventricular  endocardial borders. The left ventricular internal cavity size was normal in size. There is no left ventricular hypertrophy. Left ventricular diastolic parameters are consistent with Grade I diastolic dysfunction (impaired relaxation). Right Ventricle: The right ventricular size is normal. No increase in right ventricular wall thickness. Right ventricular systolic function is normal. Left Atrium: Left atrial size was normal in size. Right Atrium: Right atrial size was normal in size. Pericardium: There is no evidence of pericardial effusion. Mitral Valve: The mitral valve is degenerative in appearance. Mild to moderate mitral annular calcification. Trivial mitral valve regurgitation. No evidence of mitral valve stenosis. MV  peak gradient, 5.4 mmHg. The mean mitral valve gradient is 2.0 mmHg. Tricuspid Valve: The tricuspid valve is normal in structure. Tricuspid valve regurgitation is not demonstrated. No evidence of tricuspid stenosis. Aortic Valve: The aortic valve is normal in structure. Aortic valve regurgitation is not visualized. Mild aortic valve sclerosis is present, with no evidence of aortic valve stenosis. Aortic valve mean gradient measures 2.0 mmHg. Aortic valve peak gradient measures 5.4 mmHg. Aortic valve area, by VTI measures 2.39 cm. Pulmonic Valve: The pulmonic valve was normal in structure. Pulmonic valve regurgitation is not visualized. No evidence of pulmonic stenosis. Aorta: The aortic root is normal in size and structure. Venous: The inferior vena cava is normal in size with greater than 50% respiratory variability, suggesting right atrial pressure of 3 mmHg. IAS/Shunts: No atrial level shunt detected by color flow Doppler.  LEFT VENTRICLE PLAX 2D LVIDd:         4.32 cm  Diastology LVIDs:         3.33 cm  LV e' medial:    5.98 cm/s LV PW:         1.09 cm  LV E/e' medial:  7.3 LV IVS:        0.86 cm  LV e' lateral:   4.46 cm/s LVOT diam:     1.90 cm  LV E/e' lateral: 9.8 LV SV:         44 LV SV Index:   26 LVOT Area:     2.84 cm  LEFT ATRIUM             Index LA diam:        4.30 cm 2.54 cm/m LA Vol (A2C):   30.3 ml 17.88 ml/m LA Vol (A4C):   28.9 ml 17.06 ml/m LA Biplane Vol: 29.6 ml 17.47 ml/m  AORTIC VALVE  PULMONIC VALVE AV Area (Vmax):    2.11 cm    PV Vmax:       1.10 m/s AV Area (Vmean):   2.21 cm    PV Vmean:      74.800 cm/s AV Area (VTI):     2.39 cm    PV VTI:        0.215 m AV Vmax:           116.00 cm/s PV Peak grad:  4.8 mmHg AV Vmean:          72.500 cm/s PV Mean grad:  2.0 mmHg AV VTI:            0.185 m AV Peak Grad:      5.4 mmHg AV Mean Grad:      2.0 mmHg LVOT Vmax:         86.20 cm/s LVOT Vmean:        56.400 cm/s LVOT VTI:          0.156 m LVOT/AV VTI ratio: 0.84   AORTA Ao Root diam: 2.70 cm MITRAL VALVE MV Area (PHT): 6.77 cm     SHUNTS MV Area VTI:   2.62 cm     Systemic VTI:  0.16 m MV Peak grad:  5.4 mmHg     Systemic Diam: 1.90 cm MV Mean grad:  2.0 mmHg MV Vmax:       1.16 m/s MV Vmean:      61.8 cm/s MV Decel Time: 112 msec MV E velocity: 43.70 cm/s MV A velocity: 103.00 cm/s MV E/A ratio:  0.42 Shaukat Khan Electronically signed by Neoma Laming Signature Date/Time: 06/07/2021/3:12:50 PM    Final         Scheduled Meds:  aspirin  81 mg Oral Daily   atorvastatin  80 mg Oral Daily   Chlorhexidine Gluconate Cloth  6 each Topical Q0600   levothyroxine  50 mcg Oral Q0600   losartan  25 mg Oral Daily   metoprolol tartrate  25 mg Oral BID   sodium chloride flush  3 mL Intravenous Q12H   ticagrelor  90 mg Oral BID   Continuous Infusions:  sodium chloride     sodium chloride      Assessment & Plan:   Active Problems:   STEMI (ST elevation myocardial infarction) ([place])   STEMI an occluded ramus -100% occluded ramus /2 status post cardiac cath with stent placement continue aspirin, statin, beta-blocker and Brilinta Obtain echo      HTN Stable continue metoprolol and Cozaar    HLD Continue statin   Hypothyroid Continue Synthroid   OSA on CPAP   DVT prophylaxis: SCD Code Status: Full Family Communication: None at bedside Disposition Plan:  Status is: Inpatient  Remains inpatient appropriate because:Inpatient level of care appropriate due to severity of illness  Dispo: The patient is from: Home              Anticipated d/c is to: Home              Patient currently is not medically stable to d/c.   Difficult to place patient No            LOS: 1 day   Time spent: 35 minutes with more than 50% on [place], MD [facility] Pager [phone]  If 7PM-7AM, please contact night-coverage 06/07/2021, 3:57 PM

## 2021-06-08 DIAGNOSIS — E039 Hypothyroidism, unspecified: Secondary | ICD-10-CM | POA: Diagnosis not present

## 2021-06-08 DIAGNOSIS — D72829 Elevated white blood cell count, unspecified: Secondary | ICD-10-CM | POA: Diagnosis not present

## 2021-06-08 DIAGNOSIS — E785 Hyperlipidemia, unspecified: Secondary | ICD-10-CM | POA: Diagnosis not present

## 2021-06-08 DIAGNOSIS — I213 ST elevation (STEMI) myocardial infarction of unspecified site: Secondary | ICD-10-CM | POA: Diagnosis not present

## 2021-06-08 LAB — CBC
HCT: 37.3 % (ref 36.0–46.0)
Hemoglobin: 12.9 g/dL (ref 12.0–15.0)
MCH: 31.2 pg (ref 26.0–34.0)
MCHC: 34.6 g/dL (ref 30.0–36.0)
MCV: 90.1 fL (ref 80.0–100.0)
Platelets: 202 10*3/uL (ref 150–400)
RBC: 4.14 MIL/uL (ref 3.87–5.11)
RDW: 13 % (ref 11.5–15.5)
WBC: 12.7 10*3/uL — ABNORMAL HIGH (ref 4.0–10.5)
nRBC: 0 % (ref 0.0–0.2)

## 2021-06-08 LAB — BASIC METABOLIC PANEL
Anion gap: 6 (ref 5–15)
BUN: 24 mg/dL — ABNORMAL HIGH (ref 8–23)
CO2: 28 mmol/L (ref 22–32)
Calcium: 9.1 mg/dL (ref 8.9–10.3)
Chloride: 107 mmol/L (ref 98–111)
Creatinine, Ser: 0.91 mg/dL (ref 0.44–1.00)
GFR, Estimated: >60 mL/min (ref >60)
Glucose, Bld: 120 mg/dL — ABNORMAL HIGH (ref 70–99)
Potassium: 3.6 mmol/L (ref 3.5–5.1)
Sodium: 141 mmol/L (ref 135–145)

## 2021-06-08 LAB — GLUCOSE, CAPILLARY: Glucose-Capillary: 148 mg/dL — ABNORMAL HIGH (ref 70–99)

## 2021-06-08 MED ORDER — ENOXAPARIN SODIUM 40 MG/0.4ML IJ SOSY
0.5000 mg/kg | PREFILLED_SYRINGE | INTRAMUSCULAR | Status: DC
  Administered 2021-06-08: 42.5 mg via SUBCUTANEOUS
  Filled 2021-06-08: qty 0.8

## 2021-06-08 NOTE — Progress Notes (Signed)
PROGRESS NOTE    Claudia Martin  OJJ:[id number] DOB: [birth year] DOA: 06/06/2021 PCP: Lavera Guise, MD    Brief Narrative:  Claudia Martin is a 75 y.o. female with medical history significant of obesity, HTN, HLD,hypothyroid,osa on cpap,GERD who presents to ed with chest pain ongoing for close to 5 hours that started after strenuous activity. She describes pain as pressure like sensation located in left chest with radiation to her back . Patient denied any associated symptoms of sob,palpitations,n/v/diaphoresis or presyncope. On evaluation in ED patient found to have ST elevation in in 1,AVL  with depression in inferior leads with noted RBBB and LAFB. CODE STEMI was called and patient was taken to cath lab.  Patient on cardiac cath was found to have  an occluded ramus She had successful PCI and stent 100% occluded ramus. Patient was at then transferred to ICU and admitted to hospitalist service. Per patient she notes no current chest pain , sob, n/v or abdominal pain.  9/2 status post cath.  Currently chest pain and shortness of breath free 9/3 no chest pain or shortness of breath today has not ambulated yet.  Consultants:  Cardiology  Procedures: Cardiac cath  Antimicrobials:      Subjective: No dizziness or lightheadedness  Objective: Vitals:   06/08/21 0300 06/08/21 0400 06/08/21 0500 06/08/21 0600  BP:   116/67 118/70  Pulse: 65 63 69   Resp: _0 Temp:      TempSrc:      SpO2: 95% 97% 98%   Weight:      Height:        Intake/Output Summary (Last 24 hours) at 06/08/2021 0845 Last data filed at 06/07/2021 0900 Gross per 24 hour  Intake --  Output 850 ml  Net -850 ml   Filed Weights   06/06/21 2012 06/06/21 2255  Weight: 81.2 kg 83.8 kg    Examination: Lying in bed this a.m., NAD CTA no wheeze rales rhonchi's Regular S1-S2 no gallops Soft benign positive bowel sounds No edema Grossly intact   Data Reviewed: I have personally reviewed  following labs and imaging studies  CBC: Recent Labs  Lab 06/06/21 2021 06/07/21 0031 06/08/21 0500  WBC 10.6* 14.1* 12.7*  NEUTROABS 8.7*  --   --   HGB 15.0 14.4 12.9  HCT 42.4 40.5 37.3  MCV 89.5 90.0 90.1  PLT 241 218 937   Basic Metabolic Panel: Recent Labs  Lab 06/06/21 2021 06/07/21 0031 06/08/21 0500  NA 137 135 141  K 3.3* 3.5 3.6  CL 102 103 107  CO2 _1 GLUCOSE 202* 209* 120*  BUN 15 14 24*  CREATININE 0.90 0.70 0.91  CALCIUM 9.1 8.9 9.1   GFR: Estimated Creatinine Clearance: 46.2 mL/min (by C-G formula based on SCr of 0.91 mg/dL). Liver Function Tests: Recent Labs  Lab 06/06/21 2021  AST 46*  ALT 17  ALKPHOS 94  BILITOT 0.7  PROT 8.4*  ALBUMIN 4.2   No results for input(s): LIPASE, AMYLASE in the last 168 hours. No results for input(s): AMMONIA in the last 168 hours. Coagulation Profile: Recent Labs  Lab 06/06/21 2021  INR 1.0   Cardiac Enzymes: No results for input(s): CKTOTAL, CKMB, CKMBINDEX, TROPONINI in the last 168 hours. BNP (last 3 results) No results for input(s): PROBNP in the last 8760 hours. HbA1C: Recent Labs    06/06/21 2021  HGBA1C 5.8*   CBG: Recent Labs  Lab 06/06/21 2257  GLUCAP  172*   Lipid Profile: Recent Labs    06/06/21 2021  CHOL 129  HDL 39*  LDLCALC 73  TRIG 85  CHOLHDL 3.3   Thyroid Function Tests: No results for input(s): TSH, T4TOTAL, FREET4, T3FREE, THYROIDAB in the last 72 hours. Anemia Panel: No results for input(s): VITAMINB12, FOLATE, FERRITIN, TIBC, IRON, RETICCTPCT in the last 72 hours. Sepsis Labs: No results for input(s): PROCALCITON, LATICACIDVEN in the last 168 hours.  Recent Results (from the past 240 hour(s))  Resp Panel by RT-PCR (Flu A&B, Covid) Nasopharyngeal Swab     Status: None   Collection Time: 06/06/21  8:21 PM   Specimen: Nasopharyngeal Swab; Nasopharyngeal(NP) swabs in vial transport medium  Result Value Ref Range Status   SARS Coronavirus 2 by RT PCR NEGATIVE  NEGATIVE Final    Comment: (NOTE) SARS-CoV-2 target nucleic acids are NOT DETECTED.  The SARS-CoV-2 RNA is generally detectable in upper respiratory specimens during the acute phase of infection. The lowest concentration of SARS-CoV-2 viral copies this assay can detect is 138 copies/mL. A negative result does not preclude SARS-Cov-2 infection and should not be used as the sole basis for treatment or other patient management decisions. A negative result may occur with  improper specimen collection/handling, submission of specimen other than nasopharyngeal swab, presence of viral mutation(s) within the areas targeted by this assay, and inadequate number of viral copies(<138 copies/mL). A negative result must be combined with clinical observations, patient history, and epidemiological information. The expected result is Negative.  Fact Sheet for Patients:  EntrepreneurPulse.com.au  Fact Sheet for Healthcare Providers:  IncredibleEmployment.be  This test is no t yet approved or cleared by the [place] FDA and  has been authorized for detection and/or diagnosis of SARS-CoV-2 by FDA under an Emergency Use Authorization (EUA). This EUA will remain  in effect (meaning this test can be used) for the duration of the COVID-19 declaration under Section 564(b)(1) of the Act, 21 U.S.C.section 360bbb-3(b)(1), unless the authorization is terminated  or revoked sooner.       Influenza A by PCR NEGATIVE NEGATIVE Final   Influenza B by PCR NEGATIVE NEGATIVE Final    Comment: (NOTE) The Xpert Xpress SARS-CoV-2/FLU/RSV plus assay is intended as an aid in the diagnosis of influenza from Nasopharyngeal swab specimens and should not be used as a sole basis for treatment. Nasal washings and aspirates are unacceptable for Xpert Xpress SARS-CoV-2/FLU/RSV testing.  Fact Sheet for Patients: EntrepreneurPulse.com.au  Fact Sheet for Healthcare  Providers: IncredibleEmployment.be  This test is not yet approved or cleared by the [place] FDA and has been authorized for detection and/or diagnosis of SARS-CoV-2 by FDA under an Emergency Use Authorization (EUA). This EUA will remain in effect (meaning this test can be used) for the duration of the COVID-19 declaration under Section 564(b)(1) of the Act, 21 U.S.C. section 360bbb-3(b)(1), unless the authorization is terminated or revoked.  Performed at [facility], [address] [place], [place] [address]   MRSA Next Gen by PCR, Nasal     Status: None   Collection Time: 06/06/21 10:59 PM   Specimen: Nasal Mucosa; Nasal Swab  Result Value Ref Range Status   MRSA by PCR Next Gen NOT DETECTED NOT DETECTED Final    Comment: (NOTE) The GeneXpert MRSA Assay (FDA approved for NASAL specimens only), is one component of a comprehensive MRSA colonization surveillance program. It is not intended to diagnose MRSA infection nor to guide or monitor treatment for MRSA infections. Test performance is  not FDA approved in patients less than 54 years old. Performed at [facility], [address], [place], [place] [address]          Radiology Studies: CARDIAC CATHETERIZATION  Result Date: 06/07/2021   Colon Flattery Cx to Prox Cx lesion is 100% stenosed.   A drug-eluting stent was successfully placed using a STENT ONYX FRONTIER [id number].   Post intervention, there is a 0% residual stenosis.   The left ventricular systolic function is normal.   LV end diastolic pressure is mildly elevated.   The left ventricular ejection fraction is 55-65% by visual estimate. Conclusion STEMI presentation anterior lateral Diagnostic cardiac cath Preserved left ventricular function anterior lateral hypo- Preserved left ventricular function of at least 55% Coronary Left main large relatively free of disease LAD anomalous off the right no significant disease Ramus 100% occluded ramus  Circumflex moderate in size free of disease RCA relatively large free of disease Intervention Successful PCI and stent of proximal ramus/diagonal DES stent placed 2.5 x 18 mm Onyx frontier Postdilated to 2.5 stem 12 mm [place] trek to 16 atm TIMI-3 flow restored from TIMI 0 100% occlusion relieved down to 0% Minx deployed Patient transferred to ICU for recovery  DG Chest Port 1 View  Result Date: 06/06/2021 CLINICAL DATA:  Chest pain. EXAM: PORTABLE CHEST 1 VIEW COMPARISON:  Chest radiograph dated 06/26/2015 FINDINGS: Cardiomegaly with vascular congestion. No focal consolidation, pleural effusion, or pneumothorax. Degenerative changes of the spine. No acute osseous pathology. IMPRESSION: Cardiomegaly with vascular congestion. No focal consolidation. Electronically Signed   By: Anner Crete M.D.   On: 06/06/2021 21:03   ECHOCARDIOGRAM COMPLETE  Result Date: 06/07/2021    ECHOCARDIOGRAM REPORT   Patient Name:   Claudia Martin Date of Exam: 06/07/2021 Medical Rec #:  [id number]               Height:       55.0 in Accession #:    [id number]              Weight:       184.7 lb Date of Birth:  [birth year]               BSA:          1.694 m Patient Age:    89 years                BP:           129/76 mmHg Patient Gender: F                       HR:           82 bpm. Exam Location:  [facility] Procedure: 2D Echo, Color Doppler, Cardiac Doppler and Intracardiac            Opacification Agent Indications:     I21.9 Acute myocardial infarction, unspecified  History:         Patient has no prior history of Echocardiogram examinations.                  Signs/Symptoms:Shortness of Breath; Risk Factors:Hypertension                  and Sleep Apnea.  Sonographer:     Charmayne Sheer Referring Phys:  [place] Diagnosing Phys: Neoma Laming  Sonographer Comments: Suboptimal apical window. IMPRESSIONS  1. Left ventricular ejection fraction, by estimation, is 55 to 60%. The left  ventricle has normal function. The left  ventricle has no regional wall motion abnormalities. Left ventricular diastolic parameters are consistent with Grade I diastolic dysfunction (impaired relaxation).  2. Right ventricular systolic function is normal. The right ventricular size is normal.  3. The mitral valve is degenerative. Trivial mitral valve regurgitation. No evidence of mitral stenosis.  4. The aortic valve is normal in structure. Aortic valve regurgitation is not visualized. Mild aortic valve sclerosis is present, with no evidence of aortic valve stenosis.  5. The inferior vena cava is normal in size with greater than 50% respiratory variability, suggesting right atrial pressure of 3 mmHg. FINDINGS  Left Ventricle: Left ventricular ejection fraction, by estimation, is 55 to 60%. The left ventricle has normal function. The left ventricle has no regional wall motion abnormalities. Definity contrast agent was given IV to delineate the left ventricular  endocardial borders. The left ventricular internal cavity size was normal in size. There is no left ventricular hypertrophy. Left ventricular diastolic parameters are consistent with Grade I diastolic dysfunction (impaired relaxation). Right Ventricle: The right ventricular size is normal. No increase in right ventricular wall thickness. Right ventricular systolic function is normal. Left Atrium: Left atrial size was normal in size. Right Atrium: Right atrial size was normal in size. Pericardium: There is no evidence of pericardial effusion. Mitral Valve: The mitral valve is degenerative in appearance. Mild to moderate mitral annular calcification. Trivial mitral valve regurgitation. No evidence of mitral valve stenosis. MV peak gradient, 5.4 mmHg. The mean mitral valve gradient is 2.0 mmHg. Tricuspid Valve: The tricuspid valve is normal in structure. Tricuspid valve regurgitation is not demonstrated. No evidence of tricuspid stenosis. Aortic Valve: The aortic valve is normal in structure. Aortic  valve regurgitation is not visualized. Mild aortic valve sclerosis is present, with no evidence of aortic valve stenosis. Aortic valve mean gradient measures 2.0 mmHg. Aortic valve peak gradient measures 5.4 mmHg. Aortic valve area, by VTI measures 2.39 cm. Pulmonic Valve: The pulmonic valve was normal in structure. Pulmonic valve regurgitation is not visualized. No evidence of pulmonic stenosis. Aorta: The aortic root is normal in size and structure. Venous: The inferior vena cava is normal in size with greater than 50% respiratory variability, suggesting right atrial pressure of 3 mmHg. IAS/Shunts: No atrial level shunt detected by color flow Doppler.  LEFT VENTRICLE PLAX 2D LVIDd:         4.32 cm  Diastology LVIDs:         3.33 cm  LV e' medial:    5.98 cm/s LV PW:         1.09 cm  LV E/e' medial:  7.3 LV IVS:        0.86 cm  LV e' lateral:   4.46 cm/s LVOT diam:     1.90 cm  LV E/e' lateral: 9.8 LV SV:         44 LV SV Index:   26 LVOT Area:     2.84 cm  LEFT ATRIUM             Index LA diam:        4.30 cm 2.54 cm/m LA Vol (A2C):   30.3 ml 17.88 ml/m LA Vol (A4C):   28.9 ml 17.06 ml/m LA Biplane Vol: 29.6 ml 17.47 ml/m  AORTIC VALVE                   PULMONIC VALVE AV Area (Vmax):    2.11 cm    PV  Vmax:       1.10 m/s AV Area (Vmean):   2.21 cm    PV Vmean:      74.800 cm/s AV Area (VTI):     2.39 cm    PV VTI:        0.215 m AV Vmax:           116.00 cm/s PV Peak grad:  4.8 mmHg AV Vmean:          72.500 cm/s PV Mean grad:  2.0 mmHg AV VTI:            0.185 m AV Peak Grad:      5.4 mmHg AV Mean Grad:      2.0 mmHg LVOT Vmax:         86.20 cm/s LVOT Vmean:        56.400 cm/s LVOT VTI:          0.156 m LVOT/AV VTI ratio: 0.84  AORTA Ao Root diam: 2.70 cm MITRAL VALVE MV Area (PHT): 6.77 cm     SHUNTS MV Area VTI:   2.62 cm     Systemic VTI:  0.16 m MV Peak grad:  5.4 mmHg     Systemic Diam: 1.90 cm MV Mean grad:  2.0 mmHg MV Vmax:       1.16 m/s MV Vmean:      61.8 cm/s MV Decel Time: 112 msec MV E  velocity: 43.70 cm/s MV A velocity: 103.00 cm/s MV E/A ratio:  0.42 Shaukat Khan Electronically signed by Neoma Laming Signature Date/Time: 06/07/2021/3:12:50 PM    Final         Scheduled Meds:  aspirin  81 mg Oral Daily   atorvastatin  80 mg Oral Daily   Chlorhexidine Gluconate Cloth  6 each Topical Q0600   levothyroxine  50 mcg Oral Q0600   losartan  25 mg Oral Daily   metoprolol tartrate  25 mg Oral BID   moxifloxacin  1 drop Right Eye Daily   nepafenac  1 drop Right Eye Daily   prednisoLONE acetate  1 drop Right Eye Q6H   sodium chloride flush  3 mL Intravenous Q12H   ticagrelor  90 mg Oral BID   Continuous Infusions:  sodium chloride     sodium chloride      Assessment & Plan:   Active Problems:   STEMI (ST elevation myocardial infarction) ([place])   STEMI an occluded ramus -100% occluded ramus 9/3 status post cardiac cath with stent placement  Continue aspirin, statin, beta-blockers and Brilinta  Echo with normal EF  We will follow-up on cardiology's input  Goal LDL less than 70    Hypertension  stable.  Continue metoprolol and Cozaar   HLD Continue statin   Hypothyroid Continue Synthroid   OSA on CPAP  Leukocytosis-reactive/stress  improving   DVT prophylaxis: SCD Code Status: Full Family Communication: None at bedside Disposition Plan:  Status is: Inpatient  Remains inpatient appropriate because:Inpatient level of care appropriate due to severity of illness  Dispo: The patient is from: Home              Anticipated d/c is to: Home              Patient currently is not medically stable to d/c.   Difficult to place patient No            LOS: 2 days   Time spent: 35 minutes with more than 50% on COC  Nolberto Hanlon, MD [facility] Pager [phone]  If 7PM-7AM, please contact night-coverage 06/08/2021, 8:45 AM

## 2021-06-08 NOTE — Progress Notes (Deleted)
Brief cardiology note  Vitals stable No symptoms   Cardiac Medications for discharge   Aspirin 81 mg daily  Brilinta 90 mg twice a day Lipitor 80 mg daily  Losartan 25 mg daily  Metoprolol succ 25 mg daily   Recommend ambulating today  Probably okay to discharge tomorrow  F/u with me 1-2 weeks  Dr Rennis Harding covering  Thanks [facility]

## 2021-06-08 NOTE — Progress Notes (Signed)
PHARMACIST - PHYSICIAN COMMUNICATION  CONCERNING:  Enoxaparin (Lovenox) for DVT Prophylaxis    RECOMMENDATION: Patient was prescribed enoxaprin '40mg'$  q24 hours for VTE prophylaxis.   Filed Weights   06/06/21 2012 06/06/21 2255  Weight: 81.2 kg (179 lb) 83.8 kg (184 lb 11.9 oz)    Body mass index is 42.94 kg/m.  Estimated Creatinine Clearance: 46.2 mL/min (by C-G formula based on SCr of 0.91 mg/dL).   Based on [place] patient is candidate for enoxaparin 0.'5mg'$ /kg TBW SQ every 24 hours based on BMI being >30.   DESCRIPTION: Pharmacy has adjusted enoxaparin dose per [facility] policy.  Patient is now receiving enoxaparin 42.5 mg every 24 hours    Sherilyn Banker, PharmD Clinical Pharmacist  06/08/2021 6:04 PM

## 2021-06-08 NOTE — Progress Notes (Signed)
Report called to 2A and report given to [facility].  All questions addressed.  Pt transferred over via wheelchair and in no distress.

## 2021-06-09 DIAGNOSIS — I10 Essential (primary) hypertension: Secondary | ICD-10-CM | POA: Diagnosis not present

## 2021-06-09 DIAGNOSIS — G4733 Obstructive sleep apnea (adult) (pediatric): Secondary | ICD-10-CM | POA: Diagnosis not present

## 2021-06-09 DIAGNOSIS — I213 ST elevation (STEMI) myocardial infarction of unspecified site: Secondary | ICD-10-CM | POA: Diagnosis not present

## 2021-06-09 DIAGNOSIS — E785 Hyperlipidemia, unspecified: Secondary | ICD-10-CM | POA: Diagnosis not present

## 2021-06-09 LAB — CBC
HCT: 35.1 % — ABNORMAL LOW (ref 36.0–46.0)
Hemoglobin: 12.1 g/dL (ref 12.0–15.0)
MCH: 31.3 pg (ref 26.0–34.0)
MCHC: 34.5 g/dL (ref 30.0–36.0)
MCV: 90.9 fL (ref 80.0–100.0)
Platelets: 192 10*3/uL (ref 150–400)
RBC: 3.86 MIL/uL — ABNORMAL LOW (ref 3.87–5.11)
RDW: 13 % (ref 11.5–15.5)
WBC: 9.4 10*3/uL (ref 4.0–10.5)
nRBC: 0 % (ref 0.0–0.2)

## 2021-06-09 MED ORDER — ATORVASTATIN CALCIUM 80 MG PO TABS
80.0000 mg | ORAL_TABLET | Freq: Every day | ORAL | 0 refills | Status: DC
Start: 2021-06-10 — End: 2021-12-02

## 2021-06-09 MED ORDER — ASPIRIN 81 MG PO CHEW: 81.0000 mg | CHEWABLE_TABLET | Freq: Every day | ORAL | Status: AC

## 2021-06-09 MED ORDER — METOPROLOL TARTRATE 25 MG PO TABS: 25.0000 mg | ORAL_TABLET | Freq: Two times a day (BID) | ORAL | 0 refills | Status: DC

## 2021-06-09 MED ORDER — NITROGLYCERIN 0.4 MG SL SUBL
0.4000 mg | SUBLINGUAL_TABLET | SUBLINGUAL | 0 refills | Status: DC | PRN
Start: 2021-06-09 — End: 2022-12-03

## 2021-06-09 MED ORDER — LOSARTAN POTASSIUM 25 MG PO TABS: 25.0000 mg | ORAL_TABLET | Freq: Every day | ORAL | 0 refills | Status: DC

## 2021-06-09 MED ORDER — TICAGRELOR 90 MG PO TABS: 90.0000 mg | ORAL_TABLET | Freq: Two times a day (BID) | ORAL | 0 refills | Status: AC

## 2021-06-09 NOTE — Discharge Summary (Signed)
Claudia Martin INO:[id number] DOB: 12-Jan-1946 DOA: 06/06/2021  PCP: Lavera Guise, MD  Admit date: 06/06/2021 Discharge date: 06/09/2021  Admitted From: Home Disposition: Home  Recommendations for Outpatient Follow-up:  Follow up with PCP in 1 week Please obtain BMP/CBC in one week Please follow up cardiology next week     Discharge Condition:Stable CODE STATUS: Full Diet recommendation: Heart Healthy   Brief/Interim Summary: Per [facility]:GEZMO Claudia Martin is a 75 y.o. female with medical history significant of obesity, HTN, HLD,hypothyroid,osa on cpap,GERD who presents to ed with chest pain ongoing for close to 5 hours that started after strenuous activity. She describes pain as pressure like sensation located in left chest with radiation to her back . Patient denied any associated symptoms of sob,palpitations,n/v/diaphoresis or presyncope. On evaluation in ED patient found to have ST elevation in in 1,AVL  with depression in inferior leads with noted RBBB and LAFB. CODE STEMI was called and patient was taken to cath lab.  Patient on cardiac cath was found to have  an occluded ramus She had successful PCI and stent 100% occluded ramus. Patient was at then transferred to ICU and admitted to hospitalist service She has had no recurrent chest pain, shortness of breath, or any other cardiac symptoms.  She ambulated today without any angina or anginal-like symptoms.  Cardiology , Dr. Nehemiah Massed via chat cleared patient for discharge with follow-up next week.  STEMI an occluded ramus status post cardiac cath with stent placement  continue aspirin, statin, beta-blocker and Brilinta Echo with EF 55- 60%.  Grade 1 diastolic dysfunction.  No regional wall motion abnormality. Goal LDL less than 70      HTN Stable continue metoprolol and Cozaar    HLD Continue high dose Lipitor   Hypothyroid Continue Synthroid   OSA on CPAP  Discharge Diagnoses:  Active Problems:   STEMI (ST  elevation myocardial infarction) [name]    Discharge Instructions  Discharge Instructions     AMB Referral to Cardiac Rehabilitation - Phase II   Complete by: As directed    Diagnosis: STEMI   After initial evaluation and assessments completed: Virtual Based Care may be provided alone or in conjunction with Phase 2 Cardiac Rehab based on patient barriers.: Yes   Call MD for:  severe uncontrolled pain   Complete by: As directed    Call MD for:  severe uncontrolled pain   Complete by: As directed    Diet - low sodium heart healthy   Complete by: As directed    Diet - low sodium heart healthy   Complete by: As directed    Discharge instructions   Complete by: As directed    F/u with cardiology next week   Increase activity slowly   Complete by: As directed    Increase activity slowly   Complete by: As directed    No wound care   Complete by: As directed    No wound care   Complete by: As directed       Allergies as of 06/09/2021       Reactions   Other Diarrhea, Hives   Augmentin [amoxicillin-pot Clavulanate]    Bactrim [sulfamethoxazole-trimethoprim] Hives   Ibuprofen    Other reaction(s): Unknown   Ivp Dye [iodinated Diagnostic Agents] Diarrhea, Hives   Macrobid [nitrofurantoin]    Prednisone Nausea Only   Other reaction(s): Dizziness        Medication List     STOP taking these medications    amLODipine 2.5 MG  tablet Commonly known as: NORVASC   atenolol 25 MG tablet Commonly known as: TENORMIN   rosuvastatin 5 MG tablet Commonly known as: Crestor       TAKE these medications    aspirin 81 MG chewable tablet Chew 1 tablet (81 mg total) by mouth daily. Start taking on: June 10, 2021   atorvastatin 80 MG tablet Commonly known as: LIPITOR Take 1 tablet (80 mg total) by mouth daily. Start taking on: June 10, 2021   cetirizine 10 MG tablet Commonly known as: ZYRTEC Take 10 mg by mouth daily.   chlorhexidine 0.12 % solution Commonly  known as: PERIDEX Use as directed 5 mLs in the mouth or throat 2 (two) times daily.   conjugated estrogens vaginal cream Commonly known as: PREMARIN Use 1 applicatorful twice weekly. What changed:  how much to take how to take this when to take this additional instructions   EPINEPHrine 0.3 mg/0.3 mL Soaj injection Commonly known as: EPI-PEN as needed.   Ilevro 0.3 % ophthalmic suspension Generic drug: nepafenac Place 1 drop into the right eye daily.   levothyroxine 50 MCG tablet Commonly known as: SYNTHROID Take 1 tablet (50 mcg total) by mouth daily before breakfast.   losartan 25 MG tablet Commonly known as: COZAAR Take 1 tablet (25 mg total) by mouth daily. Start taking on: June 10, 2021   metoprolol tartrate 25 MG tablet Commonly known as: LOPRESSOR Take 1 tablet (25 mg total) by mouth 2 (two) times daily.   moxifloxacin 0.5 % ophthalmic solution Commonly known as: VIGAMOX Place 1 drop into the right eye 3 (three) times daily.   nitroGLYCERIN 0.4 MG SL tablet Commonly known as: NITROSTAT Place 1 tablet (0.4 mg total) under the tongue every 5 (five) minutes x 3 doses as needed for chest pain.   prednisoLONE acetate 1 % ophthalmic suspension Commonly known as: PRED FORTE Place 1 drop into the right eye 4 (four) times daily.   ticagrelor 90 MG Tabs tablet Commonly known as: BRILINTA Take 1 tablet (90 mg total) by mouth 2 (two) times daily.   valACYclovir 1000 MG tablet Commonly known as: VALTREX Take 1 tablet (1,000 mg total) by mouth daily. Pt can 1 tablet twice a day if flare up for 7 days.   Zoster Vaccine Adjuvanted injection Commonly known as: SHINGRIX Inject 0.5 mLs into the muscle once.        Follow-up Information     Callwood, Dwayne D, MD Follow up in 1 week(s).   Specialties: Cardiology, Internal Medicine Contact information: [place] [address] [phone]         Lavera Guise, MD Follow up in 1  week(s).   Specialties: Internal Medicine, Cardiology Contact information: [place] [address] [phone]                Allergies  Allergen Reactions   Other Diarrhea and Hives   Augmentin [Amoxicillin-Pot Clavulanate]    Bactrim [Sulfamethoxazole-Trimethoprim] Hives   Ibuprofen     Other reaction(s): Unknown   Ivp Dye [Iodinated Diagnostic Agents] Diarrhea and Hives   Macrobid [Nitrofurantoin]    Prednisone Nausea Only    Other reaction(s): Dizziness    Consultations: Cardiology   Procedures/Studies: CARDIAC CATHETERIZATION  Result Date: 06/07/2021   Ost Cx to Prox Cx lesion is 100% stenosed.   A drug-eluting stent was successfully placed using a STENT ONYX FRONTIER [id number].   Post intervention, there is a 0% residual stenosis.  The left ventricular systolic function is normal.   LV end diastolic pressure is mildly elevated.   The left ventricular ejection fraction is 55-65% by visual estimate. Conclusion STEMI presentation anterior lateral Diagnostic cardiac cath Preserved left ventricular function anterior lateral hypo- Preserved left ventricular function of at least 55% Coronary Left main large relatively free of disease LAD anomalous off the right no significant disease Ramus 100% occluded ramus Circumflex moderate in size free of disease RCA relatively large free of disease Intervention Successful PCI and stent of proximal ramus/diagonal DES stent placed 2.5 x 18 mm Onyx frontier Postdilated to 2.5 stem 12 mm [place] trek to 16 atm TIMI-3 flow restored from TIMI 0 100% occlusion relieved down to 0% Minx deployed Patient transferred to ICU for recovery  DG Chest Port 1 View  Result Date: 06/06/2021 CLINICAL DATA:  Chest pain. EXAM: PORTABLE CHEST 1 VIEW COMPARISON:  Chest radiograph dated 06/26/2015 FINDINGS: Cardiomegaly with vascular congestion. No focal consolidation, pleural effusion, or pneumothorax. Degenerative changes of the spine. No acute osseous  pathology. IMPRESSION: Cardiomegaly with vascular congestion. No focal consolidation. Electronically Signed   By: Anner Crete M.D.   On: 06/06/2021 21:03   ECHOCARDIOGRAM COMPLETE  Result Date: 06/07/2021    ECHOCARDIOGRAM REPORT   Patient Name:   SANDIA PFUND Date of Exam: 06/07/2021 Medical Rec #:  [id number]               Height:       55.0 in Accession #:    [id number]              Weight:       184.7 lb Date of Birth:  1946/07/27               BSA:          1.694 m Patient Age:    48 years                BP:           129/76 mmHg Patient Gender: F                       HR:           82 bpm. Exam Location:  [facility] Procedure: 2D Echo, Color Doppler, Cardiac Doppler and Intracardiac            Opacification Agent Indications:     I21.9 Acute myocardial infarction, unspecified  History:         Patient has no prior history of Echocardiogram examinations.                  Signs/Symptoms:Shortness of Breath; Risk Factors:Hypertension                  and Sleep Apnea.  Sonographer:     Charmayne Sheer Referring Phys:  [place] Diagnosing Phys: Neoma Laming  Sonographer Comments: Suboptimal apical window. IMPRESSIONS  1. Left ventricular ejection fraction, by estimation, is 55 to 60%. The left ventricle has normal function. The left ventricle has no regional wall motion abnormalities. Left ventricular diastolic parameters are consistent with Grade I diastolic dysfunction (impaired relaxation).  2. Right ventricular systolic function is normal. The right ventricular size is normal.  3. The mitral valve is degenerative. Trivial mitral valve regurgitation. No evidence of mitral stenosis.  4. The aortic valve is normal in structure. Aortic valve regurgitation is not visualized. Mild aortic valve sclerosis is  present, with no evidence of aortic valve stenosis.  5. The inferior vena cava is normal in size with greater than 50% respiratory variability, suggesting right atrial pressure of 3 mmHg.  FINDINGS  Left Ventricle: Left ventricular ejection fraction, by estimation, is 55 to 60%. The left ventricle has normal function. The left ventricle has no regional wall motion abnormalities. Definity contrast agent was given IV to delineate the left ventricular  endocardial borders. The left ventricular internal cavity size was normal in size. There is no left ventricular hypertrophy. Left ventricular diastolic parameters are consistent with Grade I diastolic dysfunction (impaired relaxation). Right Ventricle: The right ventricular size is normal. No increase in right ventricular wall thickness. Right ventricular systolic function is normal. Left Atrium: Left atrial size was normal in size. Right Atrium: Right atrial size was normal in size. Pericardium: There is no evidence of pericardial effusion. Mitral Valve: The mitral valve is degenerative in appearance. Mild to moderate mitral annular calcification. Trivial mitral valve regurgitation. No evidence of mitral valve stenosis. MV peak gradient, 5.4 mmHg. The mean mitral valve gradient is 2.0 mmHg. Tricuspid Valve: The tricuspid valve is normal in structure. Tricuspid valve regurgitation is not demonstrated. No evidence of tricuspid stenosis. Aortic Valve: The aortic valve is normal in structure. Aortic valve regurgitation is not visualized. Mild aortic valve sclerosis is present, with no evidence of aortic valve stenosis. Aortic valve mean gradient measures 2.0 mmHg. Aortic valve peak gradient measures 5.4 mmHg. Aortic valve area, by VTI measures 2.39 cm. Pulmonic Valve: The pulmonic valve was normal in structure. Pulmonic valve regurgitation is not visualized. No evidence of pulmonic stenosis. Aorta: The aortic root is normal in size and structure. Venous: The inferior vena cava is normal in size with greater than 50% respiratory variability, suggesting right atrial pressure of 3 mmHg. IAS/Shunts: No atrial level shunt detected by color flow Doppler.  LEFT  VENTRICLE PLAX 2D LVIDd:         4.32 cm  Diastology LVIDs:         3.33 cm  LV e' medial:    5.98 cm/s LV PW:         1.09 cm  LV E/e' medial:  7.3 LV IVS:        0.86 cm  LV e' lateral:   4.46 cm/s LVOT diam:     1.90 cm  LV E/e' lateral: 9.8 LV SV:         44 LV SV Index:   26 LVOT Area:     2.84 cm  LEFT ATRIUM             Index LA diam:        4.30 cm 2.54 cm/m LA Vol (A2C):   30.3 ml 17.88 ml/m LA Vol (A4C):   28.9 ml 17.06 ml/m LA Biplane Vol: 29.6 ml 17.47 ml/m  AORTIC VALVE                   PULMONIC VALVE AV Area (Vmax):    2.11 cm    PV Vmax:       1.10 m/s AV Area (Vmean):   2.21 cm    PV Vmean:      74.800 cm/s AV Area (VTI):     2.39 cm    PV VTI:        0.215 m AV Vmax:           116.00 cm/s PV Peak grad:  4.8 mmHg AV Vmean:  72.500 cm/s PV Mean grad:  2.0 mmHg AV VTI:            0.185 m AV Peak Grad:      5.4 mmHg AV Mean Grad:      2.0 mmHg LVOT Vmax:         86.20 cm/s LVOT Vmean:        56.400 cm/s LVOT VTI:          0.156 m LVOT/AV VTI ratio: 0.84  AORTA Ao Root diam: 2.70 cm MITRAL VALVE MV Area (PHT): 6.77 cm     SHUNTS MV Area VTI:   2.62 cm     Systemic VTI:  0.16 m MV Peak grad:  5.4 mmHg     Systemic Diam: 1.90 cm MV Mean grad:  2.0 mmHg MV Vmax:       1.16 m/s MV Vmean:      61.8 cm/s MV Decel Time: 112 msec MV E velocity: 43.70 cm/s MV A velocity: 103.00 cm/s MV E/A ratio:  0.42 Shaukat Khan Electronically signed by Neoma Laming Signature Date/Time: 06/07/2021/3:12:50 PM    Final       Subjective: Ambulated multiple times today no chest pain or shortness of breath  Discharge Exam: Vitals:   06/09/21 0630 06/09/21 0907  BP: (!) 102/56 109/60  Pulse: 85 92  Resp:  20  Temp: 97.8 F (36.6 C) 98.3 F (36.8 C)  SpO2: 98% 98%   Vitals:   06/08/21 1932 06/09/21 0000 06/09/21 0630 06/09/21 0907  BP: (!) 95/32 124/70 (!) 102/56 109/60  Pulse: 93 86 85 92  Resp: 16   20  Temp: 98.1 F (36.7 C) 98.3 F (36.8 C) 97.8 F (36.6 C) 98.3 F (36.8 C)  TempSrc:   Oral  Oral  SpO2: 98% 97% 98% 98%  Weight:   80.4 kg   Height:        General: Pt is alert, awake, not in acute distress Cardiovascular: RRR, S1/S2 +, no rubs, no gallops Respiratory: CTA bilaterally, no wheezing, no rhonchi Abdominal: Soft, NT, ND, bowel sounds + Extremities: no edema, no cyanosis    The results of significant diagnostics from this hospitalization (including imaging, microbiology, ancillary and laboratory) are listed below for reference.     Microbiology: Recent Results (from the past 240 hour(s))  Resp Panel by RT-PCR (Flu A&B, Covid) Nasopharyngeal Swab     Status: None   Collection Time: 06/06/21  8:21 PM   Specimen: Nasopharyngeal Swab; Nasopharyngeal(NP) swabs in vial transport medium  Result Value Ref Range Status   SARS Coronavirus 2 by RT PCR NEGATIVE NEGATIVE Final    Comment: (NOTE) SARS-CoV-2 target nucleic acids are NOT DETECTED.  The SARS-CoV-2 RNA is generally detectable in upper respiratory specimens during the acute phase of infection. The lowest concentration of SARS-CoV-2 viral copies this assay can detect is 138 copies/mL. A negative result does not preclude SARS-Cov-2 infection and should not be used as the sole basis for treatment or other patient management decisions. A negative result may occur with  improper specimen collection/handling, submission of specimen other than nasopharyngeal swab, presence of viral mutation(s) within the areas targeted by this assay, and inadequate number of viral copies(<138 copies/mL). A negative result must be combined with clinical observations, patient history, and epidemiological information. The expected result is Negative.  Fact Sheet for Patients:  EntrepreneurPulse.com.au  Fact Sheet for Healthcare Providers:  IncredibleEmployment.be  This test is no t yet approved or cleared by the [place] FDA  and  has been authorized for detection and/or diagnosis  of SARS-CoV-2 by FDA under an Emergency Use Authorization (EUA). This EUA will remain  in effect (meaning this test can be used) for the duration of the COVID-19 declaration under Section 564(b)(1) of the Act, 21 U.S.C.section 360bbb-3(b)(1), unless the authorization is terminated  or revoked sooner.       Influenza A by PCR NEGATIVE NEGATIVE Final   Influenza B by PCR NEGATIVE NEGATIVE Final    Comment: (NOTE) The Xpert Xpress SARS-CoV-2/FLU/RSV plus assay is intended as an aid in the diagnosis of influenza from Nasopharyngeal swab specimens and should not be used as a sole basis for treatment. Nasal washings and aspirates are unacceptable for Xpert Xpress SARS-CoV-2/FLU/RSV testing.  Fact Sheet for Patients: EntrepreneurPulse.com.au  Fact Sheet for Healthcare Providers: IncredibleEmployment.be  This test is not yet approved or cleared by the [place] FDA and has been authorized for detection and/or diagnosis of SARS-CoV-2 by FDA under an Emergency Use Authorization (EUA). This EUA will remain in effect (meaning this test can be used) for the duration of the COVID-19 declaration under Section 564(b)(1) of the Act, 21 U.S.C. section 360bbb-3(b)(1), unless the authorization is terminated or revoked.  Performed at [facility], [address] [place], [place] [address]   MRSA Next Gen by PCR, Nasal     Status: None   Collection Time: 06/06/21 10:59 PM   Specimen: Nasal Mucosa; Nasal Swab  Result Value Ref Range Status   MRSA by PCR Next Gen NOT DETECTED NOT DETECTED Final    Comment: (NOTE) The GeneXpert MRSA Assay (FDA approved for NASAL specimens only), is one component of a comprehensive MRSA colonization surveillance program. It is not intended to diagnose MRSA infection nor to guide or monitor treatment for MRSA infections. Test performance is not FDA approved in patients less than 36 years old. Performed at [facility], [address] [place], [place] [address]      Labs: BNP (last 3 results) No results for input(s): BNP in the last 8760 hours. Basic Metabolic Panel: Recent Labs  Lab 06/06/21 2021 06/07/21 0031 06/08/21 0500  NA 137 135 141  K 3.3* 3.5 3.6  CL 102 103 107  CO2 26 23 28   GLUCOSE 202* 209* 120*  BUN 15 14 24*  CREATININE 0.90 0.70 0.91  CALCIUM 9.1 8.9 9.1   Liver Function Tests: Recent Labs  Lab 06/06/21 2021  AST 46*  ALT 17  ALKPHOS 94  BILITOT 0.7  PROT 8.4*  ALBUMIN 4.2   No results for input(s): LIPASE, AMYLASE in the last 168 hours. No results for input(s): AMMONIA in the last 168 hours. CBC: Recent Labs  Lab 06/06/21 2021 06/07/21 0031 06/08/21 0500 06/09/21 0427  WBC 10.6* 14.1* 12.7* 9.4  NEUTROABS 8.7*  --   --   --   HGB 15.0 14.4 12.9 12.1  HCT 42.4 40.5 37.3 35.1*  MCV 89.5 90.0 90.1 90.9  PLT 241 218 202 192   Cardiac Enzymes: No results for input(s): CKTOTAL, CKMB, CKMBINDEX, TROPONINI in the last 168 hours. BNP: Invalid input(s): POCBNP CBG: Recent Labs  Lab 06/06/21 2257 06/08/21 2039  GLUCAP 172* 148*   D-Dimer No results for input(s): DDIMER in the last 72 hours. Hgb A1c Recent Labs    06/06/21 2021  HGBA1C 5.8*   Lipid Profile Recent Labs    06/06/21 2021  CHOL 129  HDL 39*  LDLCALC 73  TRIG 85  CHOLHDL 3.3  Thyroid function studies No results for input(s): TSH, T4TOTAL, T3FREE, THYROIDAB in the last 72 hours.  Invalid input(s): FREET3 Anemia work up No results for input(s): VITAMINB12, FOLATE, FERRITIN, TIBC, IRON, RETICCTPCT in the last 72 hours. Urinalysis    Component Value Date/Time   APPEARANCEUR Clear 11/30/2020 1130   GLUCOSEU Negative 11/30/2020 1130   BILIRUBINUR Negative 02/27/2021 1506   BILIRUBINUR Negative 11/30/2020 1130   PROTEINUR Positive (A) 02/27/2021 1506   PROTEINUR Trace 11/30/2020 1130   UROBILINOGEN 0.2 02/27/2021 1506   NITRITE Negative 02/27/2021 1506    NITRITE Negative 11/30/2020 1130   LEUKOCYTESUR Moderate (2+) (A) 02/27/2021 1506   LEUKOCYTESUR Trace (A) 11/30/2020 1130   Sepsis Labs Invalid input(s): PROCALCITONIN,  WBC,  LACTICIDVEN Microbiology Recent Results (from the past 240 hour(s))  Resp Panel by RT-PCR (Flu A&B, Covid) Nasopharyngeal Swab     Status: None   Collection Time: 06/06/21  8:21 PM   Specimen: Nasopharyngeal Swab; Nasopharyngeal(NP) swabs in vial transport medium  Result Value Ref Range Status   SARS Coronavirus 2 by RT PCR NEGATIVE NEGATIVE Final    Comment: (NOTE) SARS-CoV-2 target nucleic acids are NOT DETECTED.  The SARS-CoV-2 RNA is generally detectable in upper respiratory specimens during the acute phase of infection. The lowest concentration of SARS-CoV-2 viral copies this assay can detect is 138 copies/mL. A negative result does not preclude SARS-Cov-2 infection and should not be used as the sole basis for treatment or other patient management decisions. A negative result may occur with  improper specimen collection/handling, submission of specimen other than nasopharyngeal swab, presence of viral mutation(s) within the areas targeted by this assay, and inadequate number of viral copies(<138 copies/mL). A negative result must be combined with clinical observations, patient history, and epidemiological information. The expected result is Negative.  Fact Sheet for Patients:  EntrepreneurPulse.com.au  Fact Sheet for Healthcare Providers:  IncredibleEmployment.be  This test is no t yet approved or cleared by the [place] FDA and  has been authorized for detection and/or diagnosis of SARS-CoV-2 by FDA under an Emergency Use Authorization (EUA). This EUA will remain  in effect (meaning this test can be used) for the duration of the COVID-19 declaration under Section 564(b)(1) of the Act, 21 U.S.C.section 360bbb-3(b)(1), unless the authorization is terminated   or revoked sooner.       Influenza A by PCR NEGATIVE NEGATIVE Final   Influenza B by PCR NEGATIVE NEGATIVE Final    Comment: (NOTE) The Xpert Xpress SARS-CoV-2/FLU/RSV plus assay is intended as an aid in the diagnosis of influenza from Nasopharyngeal swab specimens and should not be used as a sole basis for treatment. Nasal washings and aspirates are unacceptable for Xpert Xpress SARS-CoV-2/FLU/RSV testing.  Fact Sheet for Patients: EntrepreneurPulse.com.au  Fact Sheet for Healthcare Providers: IncredibleEmployment.be  This test is not yet approved or cleared by the [place] FDA and has been authorized for detection and/or diagnosis of SARS-CoV-2 by FDA under an Emergency Use Authorization (EUA). This EUA will remain in effect (meaning this test can be used) for the duration of the COVID-19 declaration under Section 564(b)(1) of the Act, 21 U.S.C. section 360bbb-3(b)(1), unless the authorization is terminated or revoked.  Performed at [facility], [address] [place], [place] [address]   MRSA Next Gen by PCR, Nasal     Status: None   Collection Time: 06/06/21 10:59 PM   Specimen: Nasal Mucosa; Nasal Swab  Result Value Ref Range Status   MRSA by PCR Next Gen  NOT DETECTED NOT DETECTED Final    Comment: (NOTE) The GeneXpert MRSA Assay (FDA approved for NASAL specimens only), is one component of a comprehensive MRSA colonization surveillance program. It is not intended to diagnose MRSA infection nor to guide or monitor treatment for MRSA infections. Test performance is not FDA approved in patients less than 46 years old. Performed at [facility], [address] [place], [place] [address]      Time coordinating discharge: Over 30 minutes  SIGNED:   Nolberto Hanlon, MD  [facility] 06/09/2021, 1:27 PM Pager   If 7PM-7AM, please contact night-coverage [email or web address] Password [email or web address]

## 2021-06-13 ENCOUNTER — Ambulatory Visit (INDEPENDENT_AMBULATORY_CARE_PROVIDER_SITE_OTHER): Payer: Medicare Other | Admitting: Physician Assistant

## 2021-06-13 ENCOUNTER — Other Ambulatory Visit: Payer: Self-pay

## 2021-06-13 ENCOUNTER — Inpatient Hospital Stay: Payer: Medicare Other | Admitting: Internal Medicine

## 2021-06-13 ENCOUNTER — Encounter: Payer: Self-pay | Admitting: Physician Assistant

## 2021-06-13 VITALS — BP 114/73 | HR 96 | Temp 97.8°F | Resp 16 | Ht <= 58 in | Wt 176.0 lb

## 2021-06-13 DIAGNOSIS — I2510 Atherosclerotic heart disease of native coronary artery without angina pectoris: Secondary | ICD-10-CM | POA: Diagnosis not present

## 2021-06-13 DIAGNOSIS — Z09 Encounter for follow-up examination after completed treatment for conditions other than malignant neoplasm: Secondary | ICD-10-CM | POA: Diagnosis not present

## 2021-06-13 DIAGNOSIS — I10 Essential (primary) hypertension: Secondary | ICD-10-CM

## 2021-06-13 DIAGNOSIS — Z955 Presence of coronary angioplasty implant and graft: Secondary | ICD-10-CM | POA: Diagnosis not present

## 2021-06-13 DIAGNOSIS — J301 Allergic rhinitis due to pollen: Secondary | ICD-10-CM | POA: Diagnosis not present

## 2021-06-13 DIAGNOSIS — G4733 Obstructive sleep apnea (adult) (pediatric): Secondary | ICD-10-CM | POA: Diagnosis not present

## 2021-06-13 DIAGNOSIS — Z9989 Dependence on other enabling machines and devices: Secondary | ICD-10-CM | POA: Diagnosis not present

## 2021-06-13 DIAGNOSIS — J3081 Allergic rhinitis due to animal (cat) (dog) hair and dander: Secondary | ICD-10-CM | POA: Diagnosis not present

## 2021-06-13 DIAGNOSIS — I2584 Coronary atherosclerosis due to calcified coronary lesion: Secondary | ICD-10-CM

## 2021-06-13 DIAGNOSIS — J3089 Other allergic rhinitis: Secondary | ICD-10-CM | POA: Diagnosis not present

## 2021-06-13 NOTE — Progress Notes (Addendum)
[facility], [place], [place] [address]  Internal MEDICINE  Office Visit Note  Patient Name: Claudia Martin  [id number]  [id number]  Date of Service: 06/17/2021     Chief Complaint  Patient presents with   Hospitalization Follow-up    Myocardial infarction    Hypothyroidism   Hypertension     HPI Pt is here for recent hospital follow up. -Patient had cataract surgery on 8/31. -On 9/1 started having CP radiating to her back that lasted several hours and called her daughter who then took her to the ED. Upon arrival EKG showed ST elevation in 1, AvL with depression in inferior leads and RBBB and LAFB. STEMI was called and taken to cath lab where a stent was placed for 100% occlusion in ramus. Patient did well and was d/c from hospital on 06/09/21 -Med changes include stopping amlodipine, atenolol and crestor and starting '81mg'$  ASA, '80mg'$  lipitor, '25mg'$  losartan, and '90mg'$  ticagrelor BID. -Scheduled to see cardiology 9/26 -Sees ophthalmology tomorrow to follow up on right cataract surgery and will hold off on left surgery until cleared by cardiology -Having a little diarhea and some fatigue but otherwise doing ok. No CP or SOB -Advised to restrict driving for 4 weeks post MI unless cleared by cardiology sooner  Current Medication: Outpatient Encounter Medications as of 06/13/2021  Medication Sig Note   aspirin 81 MG chewable tablet Chew 1 tablet (81 mg total) by mouth daily.    atorvastatin (LIPITOR) 80 MG tablet Take 1 tablet (80 mg total) by mouth daily.    cetirizine (ZYRTEC) 10 MG tablet Take 10 mg by mouth daily.    chlorhexidine (PERIDEX) 0.12 % solution Use as directed 5 mLs in the mouth or throat 2 (two) times daily.    conjugated estrogens (PREMARIN) vaginal cream Use 1 applicatorful twice weekly. (Patient taking differently: Place 1 Applicatorful vaginally 2 (two) times a week. Use just a little bit, not an applicatorful.)    EPINEPHrine 0.3  mg/0.3 mL IJ SOAJ injection as needed. 07/16/2015: Received from: [place] 0.3 % ophthalmic suspension Place 1 drop into the right eye daily.    levothyroxine (SYNTHROID) 50 MCG tablet Take 1 tablet (50 mcg total) by mouth daily before breakfast.    losartan (COZAAR) 25 MG tablet Take 1 tablet (25 mg total) by mouth daily.    metoprolol tartrate (LOPRESSOR) 25 MG tablet Take 1 tablet (25 mg total) by mouth 2 (two) times daily.    moxifloxacin (VIGAMOX) 0.5 % ophthalmic solution Place 1 drop into the right eye 3 (three) times daily.    nitroGLYCERIN (NITROSTAT) 0.4 MG SL tablet Place 1 tablet (0.4 mg total) under the tongue every 5 (five) minutes x 3 doses as needed for chest pain.    prednisoLONE acetate (PRED FORTE) 1 % ophthalmic suspension Place 1 drop into the right eye 4 (four) times daily.    ticagrelor (BRILINTA) 90 MG TABS tablet Take 1 tablet (90 mg total) by mouth 2 (two) times daily.    valACYclovir (VALTREX) 1000 MG tablet Take 1 tablet (1,000 mg total) by mouth daily. Pt can 1 tablet twice a day if flare up for 7 days.    Zoster Vaccine Adjuvanted [facility]) injection Inject 0.5 mLs into the muscle once.    No facility-administered encounter medications on file as of 06/13/2021.    Surgical History: Past Surgical History:  Procedure Laterality Date   BREAST BIOPSY     CATARACT  EXTRACTION W/PHACO Right 06/05/2021   Procedure: CATARACT EXTRACTION PHACO AND INTRAOCULAR LENS PLACEMENT ([place]) RIGHT;  Surgeon: Leandrew Koyanagi, MD;  Location: [place];  Service: Ophthalmology;  Laterality: Right;  3.02 00:39.5   COLON SURGERY     COLONOSCOPY WITH PROPOFOL N/A 03/15/2018   Procedure: COLONOSCOPY WITH PROPOFOL;  Surgeon: Lin Landsman, MD;  Location: [facility] ENDOSCOPY;  Service: Gastroenterology;  Laterality: N/A;   COLONOSCOPY WITH PROPOFOL N/A 01/09/2021   Procedure: COLONOSCOPY WITH PROPOFOL;  Surgeon: Lin Landsman, MD;  Location: [facility]  ENDOSCOPY;  Service: Gastroenterology;  Laterality: N/A;   CORONARY/GRAFT ACUTE MI REVASCULARIZATION N/A 06/06/2021   Procedure: Coronary/Graft Acute MI Revascularization;  Surgeon: Yolonda Kida, MD;  Location: [place] CV LAB;  Service: Cardiovascular;  Laterality: N/A;   diverticulitis     ECTOPIC PREGNANCY SURGERY     fx thumb     HERNIA REPAIR     LEFT HEART CATH AND CORONARY ANGIOGRAPHY N/A 06/06/2021   Procedure: LEFT HEART CATH AND CORONARY ANGIOGRAPHY;  Surgeon: Yolonda Kida, MD;  Location: [place] CV LAB;  Service: Cardiovascular;  Laterality: N/A;    Medical History: Past Medical History:  Diagnosis Date   Abdominal pain    Allergic rhinitis    Arthritis    Candidiasis of vulva and vagina    Cellulitis and abscess    Diverticulosis    Dysuria    Enlarged heart    Eyelid inflammation    GERD (gastroesophageal reflux disease)    Hematuria syndrome    Hemorrhoids    Herpes genitalis    Herpes simplex    HTN (hypertension)    Hydronephrosis    Hypothyroidism    Incomplete bladder emptying    Internal hordeolum    Knee pain    Otalgia    Primary ovarian failure    Sciatica    Shortness of breath    Sinus bradycardia    Sleep apnea    uses CPAP   Urethral diverticulum    UTI (lower urinary tract infection)    Vaginal atrophy    Vaginitis     Family History: Family History  Problem Relation Age of Onset   Tuberculosis Mother    Nephrolithiasis Brother     Social History   Socioeconomic History   Marital status: Divorced    Spouse name: Not on file   Number of children: Not on file   Years of education: Not on file   Highest education level: Not on file  Occupational History   Not on file  Tobacco Use   Smoking status: Never   Smokeless tobacco: Never  Vaping Use   Vaping Use: Never used  Substance and Sexual Activity   Alcohol use: No   Drug use: Never   Sexual activity: Not on file  Other Topics Concern   Not on file   Social History Narrative   Not on file   Social Determinants of Health   Financial Resource Strain: Not on file  Food Insecurity: Not on file  Transportation Needs: Not on file  Physical Activity: Not on file  Stress: Not on file  Social Connections: Not on file  Intimate Partner Violence: Not on file      Review of Systems  Constitutional:  Positive for fatigue. Negative for chills and unexpected weight change.  HENT:  Negative for congestion, postnasal drip, rhinorrhea, sneezing and sore throat.   Eyes:  Negative for redness.  Respiratory:  Negative for cough, chest  tightness and shortness of breath.   Cardiovascular:  Negative for chest pain and palpitations.  Gastrointestinal:  Positive for diarrhea. Negative for abdominal pain, constipation, nausea and vomiting.  Genitourinary:  Negative for dysuria and frequency.  Musculoskeletal:  Negative for arthralgias, back pain, joint swelling and neck pain.  Skin:  Negative for rash.  Neurological:  Positive for weakness. Negative for tremors and numbness.  Hematological:  Negative for adenopathy. Does not bruise/bleed easily.  Psychiatric/Behavioral:  Negative for behavioral problems (Depression), sleep disturbance and suicidal ideas. The patient is not nervous/anxious.    Vital Signs: BP 114/73   Pulse 96   Temp 97.8 F (36.6 C)   Resp 16   Ht '4\' 7"'$  (1.397 m)   Wt 176 lb (79.8 kg)   SpO2 97%   BMI 40.91 kg/m    Physical Exam Vitals reviewed.  Constitutional:      General: She is not in acute distress.    Appearance: Normal appearance. She is obese. She is not ill-appearing.  HENT:     Head: Normocephalic and atraumatic.  Eyes:     Extraocular Movements: Extraocular movements intact.     Pupils: Pupils are equal, round, and reactive to light.  Cardiovascular:     Rate and Rhythm: Normal rate and regular rhythm.  Pulmonary:     Effort: Pulmonary effort is normal. No respiratory distress.     Breath sounds: No  wheezing.  Abdominal:     Tenderness: There is no abdominal tenderness.  Musculoskeletal:        General: Normal range of motion.  Neurological:     Mental Status: She is alert and oriented to person, place, and time.     Cranial Nerves: No cranial nerve deficit.     Coordination: Coordination normal.     Gait: Gait normal.  Psychiatric:        Mood and Affect: Mood normal.        Behavior: Behavior normal.      Assessment/Plan: 1. Hospital discharge follow-up Reviewed hospital course, med changes and discharge instructions  2. Essential hypertension Stable, continue current medications  3. S/P coronary artery stent placement Doing well post STEMI with stent placement, continue medication changes made at d/c. Follow up with cardiology as scheduled and restrict driving for 4 weeks unless cleared by cardiology sooner  4. OSA on CPAP Continue CPAP nightly   General Counseling: kamyrah whisenant understanding of the findings of todays visit and agrees with plan of treatment. I have discussed any further diagnostic evaluation that may be needed or ordered today. We also reviewed her medications today. she has been encouraged to call the office with any questions or concerns that should arise related to todays visit.    Counseling:    No orders of the defined types were placed in this encounter.   This patient was seen by Drema Dallas, PA-C in collaboration with Dr. Clayborn Bigness as a part of collaborative care agreement.   I have reviewed all medical records from hospital follow up including radiology reports and consults from other physicians. Appropriate follow up diagnostics will be scheduled as needed. Patient/ Family understands the plan of treatment. Time spent 40 minutes.   Dr Lavera Guise, MD Internal Medicine

## 2021-06-15 NOTE — Progress Notes (Addendum)
[facility] Cardiology    SUBJECTIVE: Status post STEMI presentation subsequent PCI and stent with DES to ramus doing reasonably well advised to ambulate in the hall hopefully can be discharged home soon   Vitals:   06/08/21 1932 06/09/21 0000 06/09/21 0630 06/09/21 0907  BP: (!) 95/32 124/70 (!) 102/56 109/60  Pulse: 93 86 85 92  Resp: 16   20  Temp: 98.1 F (36.7 C) 98.3 F (36.8 C) 97.8 F (36.6 C) 98.3 F (36.8 C)  TempSrc:  Oral  Oral  SpO2: 98% 97% 98% 98%  Weight:   80.4 kg   Height:        No intake or output data in the 24 hours ending 06/15/21 1356    PHYSICAL EXAM  General: Well developed, well nourished, in no acute distress HEENT:  Normocephalic and atramatic Neck:  No JVD.  Lungs: Clear bilaterally to auscultation and percussion. Heart: HRRR . Normal S1 and S2 without gallops or murmurs.  Abdomen: Bowel sounds are positive, abdomen soft and non-tender  Msk:  Back normal, normal gait. Normal strength and tone for age. Extremities: No clubbing, cyanosis or edema.   Neuro: Alert and oriented X 3. Psych:  Good affect, responds appropriately   LABS: Basic Metabolic Panel: No results for input(s): NA, K, CL, CO2, GLUCOSE, BUN, CREATININE, CALCIUM, MG, PHOS in the last 72 hours. Liver Function Tests: No results for input(s): AST, ALT, ALKPHOS, BILITOT, PROT, ALBUMIN in the last 72 hours. No results for input(s): LIPASE, AMYLASE in the last 72 hours. CBC: No results for input(s): WBC, NEUTROABS, HGB, HCT, MCV, PLT in the last 72 hours. Cardiac Enzymes: No results for input(s): CKTOTAL, CKMB, CKMBINDEX, TROPONINI in the last 72 hours. BNP: Invalid input(s): POCBNP D-Dimer: No results for input(s): DDIMER in the last 72 hours. Hemoglobin A1C: No results for input(s): HGBA1C in the last 72 hours. Fasting Lipid Panel: No results for input(s): CHOL, HDL, LDLCALC, TRIG, CHOLHDL, LDLDIRECT in the last 72 hours. Thyroid Function Tests: No results for input(s): TSH,  T4TOTAL, T3FREE, THYROIDAB in the last 72 hours.  Invalid input(s): FREET3 Anemia Panel: No results for input(s): VITAMINB12, FOLATE, FERRITIN, TIBC, IRON, RETICCTPCT in the last 72 hours.  No results found.   Echo preserved left ventricular function  TELEMETRY: Normal sinus rhythm nonspecific ST-T wave changes:  ASSESSMENT AND PLAN:  Active Problems:   STEMI (ST elevation myocardial infarction) ([place]) Coronary disease Status post cardiac cath Does post PCI and stent to occluded ramus Congenital anomalous LAD of the right Obesity Hypertension Hyperlipidemia Obstructive sleep apnea GERD   Plan Patient postop from STEMI with placement of DES to ramus successfully Continue Brilinta aspirin for at least 12 months Beta-blocker ARB be inhibitor statin therapy as well Increase activity ambulate in the hall Hopefully discharge home soon follow-up with cardiology 1 to 2 weeks   Yolonda Kida, MD, 06/15/2021 1:56 PM

## 2021-06-18 DIAGNOSIS — J3081 Allergic rhinitis due to animal (cat) (dog) hair and dander: Secondary | ICD-10-CM | POA: Diagnosis not present

## 2021-06-18 DIAGNOSIS — J301 Allergic rhinitis due to pollen: Secondary | ICD-10-CM | POA: Diagnosis not present

## 2021-06-18 DIAGNOSIS — J3089 Other allergic rhinitis: Secondary | ICD-10-CM | POA: Diagnosis not present

## 2021-06-19 ENCOUNTER — Ambulatory Visit: Admission: RE | Admit: 2021-06-19 | Payer: Medicare Other | Source: Home / Self Care | Admitting: Ophthalmology

## 2021-06-19 SURGERY — PHACOEMULSIFICATION, CATARACT, WITH IOL INSERTION
Anesthesia: Topical | Laterality: Left

## 2021-06-21 ENCOUNTER — Telehealth: Payer: Medicare Other

## 2021-06-25 DIAGNOSIS — J3081 Allergic rhinitis due to animal (cat) (dog) hair and dander: Secondary | ICD-10-CM | POA: Diagnosis not present

## 2021-06-25 DIAGNOSIS — J301 Allergic rhinitis due to pollen: Secondary | ICD-10-CM | POA: Diagnosis not present

## 2021-06-25 DIAGNOSIS — J3089 Other allergic rhinitis: Secondary | ICD-10-CM | POA: Diagnosis not present

## 2021-07-01 DIAGNOSIS — G4733 Obstructive sleep apnea (adult) (pediatric): Secondary | ICD-10-CM | POA: Diagnosis not present

## 2021-07-01 DIAGNOSIS — I213 ST elevation (STEMI) myocardial infarction of unspecified site: Secondary | ICD-10-CM | POA: Diagnosis not present

## 2021-07-01 DIAGNOSIS — I209 Angina pectoris, unspecified: Secondary | ICD-10-CM | POA: Diagnosis not present

## 2021-07-01 DIAGNOSIS — I2510 Atherosclerotic heart disease of native coronary artery without angina pectoris: Secondary | ICD-10-CM | POA: Diagnosis not present

## 2021-07-01 DIAGNOSIS — I10 Essential (primary) hypertension: Secondary | ICD-10-CM | POA: Diagnosis not present

## 2021-07-01 DIAGNOSIS — I219 Acute myocardial infarction, unspecified: Secondary | ICD-10-CM | POA: Diagnosis not present

## 2021-07-02 ENCOUNTER — Telehealth: Payer: Self-pay | Admitting: Pharmacist

## 2021-07-02 DIAGNOSIS — J3089 Other allergic rhinitis: Secondary | ICD-10-CM | POA: Diagnosis not present

## 2021-07-02 DIAGNOSIS — J301 Allergic rhinitis due to pollen: Secondary | ICD-10-CM | POA: Diagnosis not present

## 2021-07-02 DIAGNOSIS — J3081 Allergic rhinitis due to animal (cat) (dog) hair and dander: Secondary | ICD-10-CM | POA: Diagnosis not present

## 2021-07-02 NOTE — Progress Notes (Signed)
Chronic Care Management Pharmacy Assistant   Name: Maryagnes Carrasco  MRN: [id number] DOB: [birth year]  Claudia Martin is an 75 y.o. year old female who presents for his initial CCM visit with the clinical pharmacist.  Reason for Encounter: Chart Prep    Conditions to be addressed/monitored: HTN, Vitamin B12, Vitamin D.  Primary concerns for visit include: HTN.  Recent office visits:  06/13/21 Mylinda Latina, PA-C. For hospital follow-up. No medication changes.  05/31/21 Jonetta Osgood, NP. For follow-up. For follow-up. No medication changes. 04/11/21 McDonough, Si Gaul, PA-C. For follow-up. No medication changes.  02/27/21  Jonetta Osgood, NP. For follow-up. STARTED Fluconazole 150 mg once.  Recent consult visits:  Allergy Mosetta Anis every 2 weeks for the last months.  Hospital visits:  06/06/21 [facility] (3 Days) Nolberto Hanlon, MD. For chest pain.  06/05/21 [place] (1 Hour) Leandrew Koyanagi, MD. For CATARACT EXTRACTION PHACO AND INTRAOCULAR LENS PLACEMENT ([place]) RIGHT. 01/09/21 [facility] (2 Hours) Lin Landsman, MD. For Colonoscopy With Propofol.   Medication history: Atorvastatin 80 mg 07/02/21 90 DS Losartan 25 mg 06/09/21 30 DS  Medications: Outpatient Encounter Medications as of 07/02/2021  Medication Sig Note   aspirin 81 MG chewable tablet Chew 1 tablet (81 mg total) by mouth daily.    atorvastatin (LIPITOR) 80 MG tablet Take 1 tablet (80 mg total) by mouth daily.    cetirizine (ZYRTEC) 10 MG tablet Take 10 mg by mouth daily.    chlorhexidine (PERIDEX) 0.12 % solution Use as directed 5 mLs in the mouth or throat 2 (two) times daily.    conjugated estrogens (PREMARIN) vaginal cream Use 1 applicatorful twice weekly. (Patient taking differently: Place 1 Applicatorful vaginally 2 (two) times a week. Use just a little bit, not an applicatorful.)    EPINEPHrine 0.3 mg/0.3 mL IJ SOAJ  injection as needed. 07/16/2015: Received from: [place] 0.3 % ophthalmic suspension Place 1 drop into the right eye daily.    levothyroxine (SYNTHROID) 50 MCG tablet Take 1 tablet (50 mcg total) by mouth daily before breakfast.    losartan (COZAAR) 25 MG tablet Take 1 tablet (25 mg total) by mouth daily.    metoprolol tartrate (LOPRESSOR) 25 MG tablet Take 1 tablet (25 mg total) by mouth 2 (two) times daily.    moxifloxacin (VIGAMOX) 0.5 % ophthalmic solution Place 1 drop into the right eye 3 (three) times daily.    nitroGLYCERIN (NITROSTAT) 0.4 MG SL tablet Place 1 tablet (0.4 mg total) under the tongue every 5 (five) minutes x 3 doses as needed for chest pain.    prednisoLONE acetate (PRED FORTE) 1 % ophthalmic suspension Place 1 drop into the right eye 4 (four) times daily.    ticagrelor (BRILINTA) 90 MG TABS tablet Take 1 tablet (90 mg total) by mouth 2 (two) times daily.    valACYclovir (VALTREX) 1000 MG tablet Take 1 tablet (1,000 mg total) by mouth daily. Pt can 1 tablet twice a day if flare up for 7 days.    Zoster Vaccine Adjuvanted [facility]) injection Inject 0.5 mLs into the muscle once.    No facility-administered encounter medications on file as of 07/02/2021.   Have you seen any other providers since your last visit? Patient stated no.  Any changes in your medications or health? Patient stated no.  Any side effects from any medications? Patient stated no.  Do you have an symptoms or problems not  managed by your medications? Patient stated she gets light headed sometimes.   Any concerns about your health right now? Patient stated she has noticed bruises and is concerned about it.   Has your provider asked that you check blood pressure, blood sugar, or follow special diet at home? Patient stated no.  Do you get any type of exercise on a regular basis? Patient stated no.   Can you think of a goal you would like to reach for your health? Patient  stated to just fee better.   Do you have any problems getting your medications? Patient stated no.   Is there anything that you would like to discuss during the appointment? Patient stated no.   Please bring medications and supplements to appointment,patient reminded of her OTP appointment on 07/05/21 at [place] am.   [place], [place] Pharmacist Assistant [phone]

## 2021-07-03 NOTE — Progress Notes (Signed)
Chronic Care Management Pharmacy Note  07/05/2021 Name:  Claudia Martin MRN:  [id number] DOB:  09-25-1946  Summary: Initial visit with PharmD.  Patient with MI about one month ago.  Started new medications and tolerating well.  Not really checking BP at home and not much exercise outside of her daily routines.  Has knee pain.  No concerns at this time  Recommendations/Changes made from today's visit: Increase home monitoring of BP  Plan: FU 4 months   Subjective: Claudia Martin is an 75 y.o. year old female who is a primary patient of Humphrey Rolls, Timoteo Gaul, MD.  The CCM team was consulted for assistance with disease management and care coordination needs.    Engaged with patient by telephone for initial visit in response to provider referral for pharmacy case management and/or care coordination services.   Consent to Services:  The patient was given the following information about Chronic Care Management services today, agreed to services, and gave verbal consent: 1. CCM service includes personalized support from designated clinical staff supervised by the primary care provider, including individualized plan of care and coordination with other care providers 2. 24/7 contact phone numbers for assistance for urgent and routine care needs. 3. Service will only be billed when office clinical staff spend 20 minutes or more in a month to coordinate care. 4. Only one practitioner may furnish and bill the service in a calendar month. 5.The patient may stop CCM services at any time (effective at the end of the month) by phone call to the office staff. 6. The patient will be responsible for cost sharing (co-pay) of up to 20% of the service fee (after annual deductible is met). Patient agreed to services and consent obtained.  Patient Care Team: Lavera Guise, MD as PCP - General (Internal Medicine) Edythe Clarity, [facility] as Pharmacist (Pharmacist)  Recent office visits:  06/13/21  Mylinda Latina, PA-C. For hospital follow-up. No medication changes.  05/31/21 Jonetta Osgood, NP. For follow-up. For follow-up. No medication changes. 04/11/21 McDonough, Si Gaul, PA-C. For follow-up. No medication changes.  02/27/21  Jonetta Osgood, NP. For follow-up. STARTED Fluconazole 150 mg once.   Recent consult visits:  Allergy Mosetta Anis every 2 weeks for the last months.   Hospital visits:  06/06/21 [facility] (3 Days) Nolberto Hanlon, MD. For chest pain.  06/05/21 [facility] (1 Hour) Leandrew Koyanagi, MD. For CATARACT EXTRACTION PHACO AND INTRAOCULAR LENS PLACEMENT ([place]) RIGHT. 01/09/21 [facility] (2 Hours) Lin Landsman, MD. For Colonoscopy With Propofol.    Medication history: Atorvastatin 80 mg 07/02/21 90 DS Losartan 25 mg 06/09/21 30 DS   Objective:  Lab Results  Component Value Date   CREATININE 0.91 06/08/2021   BUN 24 (H) 06/08/2021   GFRNONAA >60 06/08/2021   GFRAA 78 11/20/2020   NA 141 06/08/2021   K 3.6 06/08/2021   CALCIUM 9.1 06/08/2021   CO2 28 06/08/2021   GLUCOSE 120 (H) 06/08/2021    Lab Results  Component Value Date/Time   HGBA1C 5.8 (H) 06/06/2021 08:21 PM   HGBA1C 5.7 (H) 11/20/2020 11:54 AM    Last diabetic Eye exam: No results found for: HMDIABEYEEXA  Last diabetic Foot exam: No results found for: HMDIABFOOTEX   Lab Results  Component Value Date   CHOL 129 06/06/2021   HDL 39 (L) 06/06/2021   LDLCALC 73 06/06/2021   TRIG 85 06/06/2021   CHOLHDL 3.3 06/06/2021    Hepatic Function  Latest Ref Rng & Units 06/06/2021 11/20/2020 11/10/2019  Total Protein 6.5 - 8.1 g/dL 8.4(H) 7.9 8.1  Albumin 3.5 - 5.0 g/dL 4.2 4.3 4.3  AST 15 - 41 U/L 46(H) 17 19  ALT 0 - 44 U/L _0 Alk Phosphatase 38 - 126 U/L 94 128(H) 131(H)  Total Bilirubin 0.3 - 1.2 mg/dL 0.7 0.3 0.3    Lab Results  Component Value Date/Time   TSH 2.630 11/20/2020 11:54 AM   TSH 2.220 11/10/2019 11:57 AM    FREET4 0.97 11/20/2020 11:54 AM   FREET4 1.02 11/10/2019 11:57 AM    CBC Latest Ref Rng & Units 06/09/2021 06/08/2021 06/07/2021  WBC 4.0 - 10.5 K/uL 9.4 12.7(H) 14.1(H)  Hemoglobin 12.0 - 15.0 g/dL 12.1 12.9 14.4  Hematocrit 36.0 - 46.0 % 35.1(L) 37.3 40.5  Platelets 150 - 400 K/uL 192 202 218    Lab Results  Component Value Date/Time   VD25OH 29.4 (L) 11/20/2020 11:54 AM   VD25OH 41.0 11/10/2019 11:57 AM    Clinical ASCVD: Yes  The ASCVD Risk score (Arnett DK, et al., 2019) failed to calculate for the following reasons:   The patient has a prior MI or stroke diagnosis    Depression screen [facility] 2/9 06/13/2021 05/31/2021 02/27/2021  Decreased Interest 0 0 0  Down, Depressed, Hopeless 0 0 0  PHQ - 2 Score 0 0 0    Social History   Tobacco Use  Smoking Status Never  Smokeless Tobacco Never   BP Readings from Last 3 Encounters:  06/13/21 114/73  06/09/21 109/60  06/05/21 137/70   Pulse Readings from Last 3 Encounters:  06/13/21 96  06/09/21 92  06/05/21 64   Wt Readings from Last 3 Encounters:  06/13/21 176 lb (79.8 kg)  06/09/21 177 lb 4.8 oz (80.4 kg)  06/05/21 178 lb (80.7 kg)   BMI Readings from Last 3 Encounters:  06/13/21 40.91 kg/m  06/09/21 41.21 kg/m  06/05/21 41.37 kg/m    Assessment/Interventions: Review of patient past medical history, allergies, medications, health status, including review of consultants reports, laboratory and other test data, was performed as part of comprehensive evaluation and provision of chronic care management services.   SDOH:  (Social Determinants of Health) assessments and interventions performed: Yes  Financial Resource Strain: Not on file    SDOH Screenings   Alcohol Screen: Low Risk    Last Alcohol Screening Score (AUDIT): 0  Depression (PHQ2-9): Low Risk    PHQ-2 Score: 0  Financial Resource Strain: Not on file  Food Insecurity: Not on file  Housing: Not on file  Physical Activity: Not on file  Social  Connections: Not on file  Stress: Not on file  Tobacco Use: Low Risk    Smoking Tobacco Use: Never   Smokeless Tobacco Use: Never  Transportation Needs: Not on file    [place]  Allergies  Allergen Reactions   Other Diarrhea and Hives   Augmentin [Amoxicillin-Pot Clavulanate]    Bactrim [Sulfamethoxazole-Trimethoprim] Hives   Ibuprofen     Other reaction(s): Unknown   Ivp Dye [Iodinated Diagnostic Agents] Diarrhea and Hives   Macrobid [Nitrofurantoin]    Prednisone Nausea Only    Other reaction(s): Dizziness    Medications Reviewed Today     Reviewed by Edythe Clarity, [facility] (Pharmacist) on 07/05/21 at [place] List Status: <None>   Medication Order Taking? Sig Documenting Provider Last Dose Status Informant  aspirin 81 MG chewable tablet [id number] Yes Chew 1  tablet (81 mg total) by mouth daily. Nolberto Hanlon, MD Taking Active   atorvastatin (LIPITOR) 80 MG tablet [id number] Yes Take 1 tablet (80 mg total) by mouth daily. Nolberto Hanlon, MD Taking Active   cetirizine (ZYRTEC) 10 MG tablet [id number] Yes Take 10 mg by mouth daily. [provider] Taking Active Self  chlorhexidine (PERIDEX) 0.12 % solution [id number] Yes Use as directed 5 mLs in the mouth or throat 2 (two) times daily. Jonetta Osgood, NP Taking Active   conjugated estrogens (PREMARIN) vaginal cream [id number] Yes Use 1 applicatorful twice weekly.  Patient taking differently: Place 1 Applicatorful vaginally 2 (two) times a week. Use just a little bit, not an applicatorful.   Lavera Guise, MD Taking Active   EPINEPHrine 0.3 mg/0.3 mL IJ SOAJ injection [id number] Yes as needed. [provider] Taking Active Self           Med Note Florian Buff Jul 16, 2015  1:59 PM) Received from: [place] 0.3 % ophthalmic suspension [id number]  Place 1 drop into the right eye daily. [provider]  Active   levothyroxine (SYNTHROID) 50 MCG tablet [id number] Yes  Take 1 tablet (50 mcg total) by mouth daily before breakfast. Lavera Guise, MD Taking Active   losartan (COZAAR) 25 MG tablet [id number] Yes Take 1 tablet (25 mg total) by mouth daily. Nolberto Hanlon, MD Taking Active   metoprolol tartrate (LOPRESSOR) 25 MG tablet [id number] Yes Take 1 tablet (25 mg total) by mouth 2 (two) times daily. Nolberto Hanlon, MD Taking Active   moxifloxacin (VIGAMOX) 0.5 % ophthalmic solution [id number]  Place 1 drop into the right eye 3 (three) times daily. [provider]  Active   nitroGLYCERIN (NITROSTAT) 0.4 MG SL tablet [id number] Yes Place 1 tablet (0.4 mg total) under the tongue every 5 (five) minutes x 3 doses as needed for chest pain. Nolberto Hanlon, MD Taking Active   prednisoLONE acetate (PRED FORTE) 1 % ophthalmic suspension [id number] Yes Place 1 drop into the right eye 4 (four) times daily. [provider] Taking Active   ticagrelor (BRILINTA) 90 MG TABS tablet [id number] Yes Take 1 tablet (90 mg total) by mouth 2 (two) times daily. Nolberto Hanlon, MD Taking Active   valACYclovir (VALTREX) 1000 MG tablet [id number]  Take 1 tablet (1,000 mg total) by mouth daily. Pt can 1 tablet twice a day if flare up for 7 days. Lavera Guise, MD  Active   Zoster Vaccine Adjuvanted [facility]) injection [id number] Yes Inject 0.5 mLs into the muscle once. [provider] Taking Active             Patient Active Problem List   Diagnosis Date Noted   STEMI (ST elevation myocardial infarction) ([place]) 06/06/2021   History of colonic polyps    Urinary tract infection without hematuria 01/04/2020   Candidiasis of skin 01/04/2020   Ventral hernia without obstruction or gangrene 11/28/2019   Herpes simplex infection 10/27/2019   Stomatitis and mucositis 10/27/2019   Encounter for general adult medical examination with abnormal findings 10/27/2019   Vitamin B12 deficiency 06/30/2018   Unspecified menopausal and perimenopausal disorder 06/30/2018   Vitamin D  deficiency 06/30/2018   Flu vaccine need 06/30/2018   Urinary tract infection with hematuria 02/05/2018   Acquired hypothyroidism 02/05/2018   Essential hypertension 02/05/2018   Screening for breast cancer 02/05/2018   Dysuria 02/05/2018   Obstructive sleep apnea (adult) (pediatric) 01/06/2018    Immunization  History  Administered Date(s) Administered   Fluad Quad(high Dose 65+) 07/19/2019   Influenza Inj Mdck Quad Pf 06/21/2018   Influenza, High Dose Seasonal PF 09/11/2015, 11/06/2016, 06/16/2017, 11/02/2018, 10/18/2019   Influenza,inj,Quad PF,6+ Mos 07/20/2019   Influenza-Unspecified 08/22/2020   Moderna Sars-Covid-2 Vaccination 12/10/2019, 01/07/2020, 04/26/2020, 08/22/2020   Pneumococcal Polysaccharide-23 07/17/2015, 09/11/2015, 03/13/2016, 11/06/2016, 12/15/2017, 11/02/2018, 10/18/2019, 04/25/2021   Zoster, Live 05/15/2016    Conditions to be addressed/monitored:  HTN, Hx of MI, Hypothyroidism, OSA  Care Plan : General Pharmacy (Adult)  Updates made by Edythe Clarity, RPH since 07/05/2021 12:00 AM     Problem: HTN, Hx of MI, Hypothyroidism, OSA   Priority: High  Onset Date: 07/05/2021  Note:   Current Barriers:  Unable to independently monitor therapeutic efficacy  Pharmacist Clinical Goal(s):  Patient will achieve adherence to monitoring guidelines and medication adherence to achieve therapeutic efficacy maintain control of BP and cholesterol as evidenced by LDL/monitoring  through collaboration with PharmD and provider.   Interventions: 1:1 collaboration with Lavera Guise, MD regarding development and update of comprehensive plan of care as evidenced by provider attestation and co-signature Inter-disciplinary care team collaboration (see longitudinal plan of care) Comprehensive medication review performed; medication list updated in electronic medical record  Hypertension (BP goal <130/80) -Controlled -Current treatment: Losartan 25mg  daily Metoprolol 25mg   BID -Medications previously tried: amlodipine  -Current home readings: no logs today, does have cuff at home and her daughter will check it occasionally -Current dietary habits: trying to limit salts and excess sugars, she is a "sugar freak" and likes to have things like donuts around to snack on -Current exercise habits: minimal due to knee pain, will go out and shop 2-3 times per week and this is the majority of her walking -Reports hypotensive/hypertensive symptoms - some dizziness the other day when she stood up too fast -Educated on BP goals and benefits of medications for prevention of heart attack, stroke and kidney damage; Daily salt intake goal < 2300 mg; Exercise goal of 150 minutes per week; Importance of home blood pressure monitoring; Symptoms of hypotension and importance of maintaining adequate hydration; -Counseled to monitor BP at home weekly, document, and provide log at future appointments -Recommended to continue current medication  Hyperlipidemia/Hx of MI (Goal: Reduce CV risk, LDL < 70) -Controlled -Current treatment  Brilinta 3mcg daily ASA 81mg  daily Atorvastatin 80mg  daily -Medications previously tried: none noted -She does mention some bruising since starting Brilinta, counseled that this was normal and warning signs to look out for such as bleeding in gums, or blood in stool/urine -She has extra help with Medicare so brand name copay is not a burden for her at this time -Tolerating the statin fine. Will need to readdress DAPT in one year post MI.  -Recommended to continue current medication  Hypothyroidism (Goal: Maintain TSH) -Controlled -Current treatment  Levothyroxine 59mcg daily -Medications previously tried: none noted -TSH is controlled, taking medication appropriately  -Recommended to continue current medication  Patient Goals/Self-Care Activities Patient will:  - take medications as prescribed check blood pressure weekly, document, and  provide at future appointments target a minimum of 150 minutes of moderate intensity exercise weekly engage in dietary modifications by limiting amount of sugars and salt  Follow Up Plan: The care management team will reach out to the patient again over the next 120 days.       Goal: Patient-Specific Goal        Medication Assistance: None required.  Patient affirms current coverage  meets needs.  Compliance/Adherence/Medication fill history: Care Gaps: None  Star-Rating Drugs: Atorvastatin 80 07/02/21 90ds Losartan 44m 06/09/21 90ds  [facility]  [facility] [address] [facility] [place]Phone: [phone]: [phone] [facility]  ([place] [facility] [place]Phone: [phone]: [phone]), [place]- [address] [address]Phone: [phone]: [address] [place]- [address]Phone: [phone]: [phone] Uses pill box? Yes Pt endorses 100% compliance  We discussed: Benefits of medication synchronization, packaging and delivery as well as enhanced pharmacist oversight with [employer]. Patient decided to: Continue current medication management strategy  Care Plan and Follow Up Patient Decision:  Patient agrees to Care Plan and Follow-up.  Plan: The care management team will reach out to the patient again over the next 120 days.  CBeverly Milch PharmD Clinical Pharmacist [facility]Advent[facility]([phone]

## 2021-07-05 ENCOUNTER — Ambulatory Visit: Payer: Medicare Other | Admitting: Pharmacist

## 2021-07-05 DIAGNOSIS — E039 Hypothyroidism, unspecified: Secondary | ICD-10-CM

## 2021-07-05 DIAGNOSIS — E782 Mixed hyperlipidemia: Secondary | ICD-10-CM

## 2021-07-05 DIAGNOSIS — I10 Essential (primary) hypertension: Secondary | ICD-10-CM

## 2021-07-05 NOTE — Patient Instructions (Addendum)
Visit Information   Goals Addressed             This Visit's Progress    Track and Manage My Blood Pressure-Hypertension       Timeframe:  Long-Range Goal Priority:  High Start Date:  07/05/21                           Expected End Date:  01/02/22                     Follow Up Date 10/04/21/    - check blood pressure weekly - choose a place to take my blood pressure (home, clinic or office, retail store) - write blood pressure results in a log or diary    Why is this important?   You won't feel high blood pressure, but it can still hurt your blood vessels.  High blood pressure can cause heart or kidney problems. It can also cause a stroke.  Making lifestyle changes like losing a little weight or eating less salt will help.  Checking your blood pressure at home and at different times of the day can help to control blood pressure.  If the doctor prescribes medicine remember to take it the way the doctor ordered.  Call the office if you cannot afford the medicine or if there are questions about it.     Notes:        Patient Care Plan: General Pharmacy (Adult)     Problem Identified: HTN, Hx of MI, Hypothyroidism, OSA   Priority: High  Onset Date: 07/05/2021  Note:   Current Barriers:  Unable to independently monitor therapeutic efficacy  Pharmacist Clinical Goal(s):  Patient will achieve adherence to monitoring guidelines and medication adherence to achieve therapeutic efficacy maintain control of BP and cholesterol as evidenced by LDL/monitoring  through collaboration with PharmD and provider.   Interventions: 1:1 collaboration with Lavera Guise, MD regarding development and update of comprehensive plan of care as evidenced by provider attestation and co-signature Inter-disciplinary care team collaboration (see longitudinal plan of care) Comprehensive medication review performed; medication list updated in electronic medical record  Hypertension (BP goal  <130/80) -Controlled -Current treatment: Losartan 25mg  daily Metoprolol 25mg  BID -Medications previously tried: amlodipine  -Current home readings: no logs today, does have cuff at home and her daughter will check it occasionally -Current dietary habits: trying to limit salts and excess sugars, she is a "sugar freak" and likes to have things like donuts around to snack on -Current exercise habits: minimal due to knee pain, will go out and shop 2-3 times per week and this is the majority of her walking -Reports hypotensive/hypertensive symptoms - some dizziness the other day when she stood up too fast -Educated on BP goals and benefits of medications for prevention of heart attack, stroke and kidney damage; Daily salt intake goal < 2300 mg; Exercise goal of 150 minutes per week; Importance of home blood pressure monitoring; Symptoms of hypotension and importance of maintaining adequate hydration; -Counseled to monitor BP at home weekly, document, and provide log at future appointments -Recommended to continue current medication  Hyperlipidemia/Hx of MI (Goal: Reduce CV risk, LDL < 70) -Controlled -Current treatment  Brilinta 42mcg daily ASA 81mg  daily Atorvastatin 80mg  daily -Medications previously tried: none noted -She does mention some bruising since starting Brilinta, counseled that this was normal and warning signs to look out for such as bleeding in gums, or blood in  stool/urine -She has extra help with Medicare so brand name copay is not a burden for her at this time -Tolerating the statin fine. Will need to readdress DAPT in one year post MI.  -Recommended to continue current medication  Hypothyroidism (Goal: Maintain TSH) -Controlled -Current treatment  Levothyroxine 55mcg daily -Medications previously tried: none noted -TSH is controlled, taking medication appropriately  -Recommended to continue current medication  Patient Goals/Self-Care Activities Patient will:  -  take medications as prescribed check blood pressure weekly, document, and provide at future appointments target a minimum of 150 minutes of moderate intensity exercise weekly engage in dietary modifications by limiting amount of sugars and salt  Follow Up Plan: The care management team will reach out to the patient again over the next 120 days.       Goal: Patient-Specific Goal       Ms. Nathaly Dawkins was given information about Chronic Care Management services today including:  CCM service includes personalized support from designated clinical staff supervised by her physician, including individualized plan of care and coordination with other care providers 24/7 contact phone numbers for assistance for urgent and routine care needs. Standard insurance, coinsurance, copays and deductibles apply for chronic care management only during months in which we provide at least 20 minutes of these services. Most insurances cover these services at 100%, however patients may be responsible for any copay, coinsurance and/or deductible if applicable. This service may help you avoid the need for more expensive face-to-face services. Only one practitioner may furnish and bill the service in a calendar month. The patient may stop CCM services at any time (effective at the end of the month) by phone call to the office staff.  Patient agreed to services and verbal consent obtained.   The patient verbalized understanding of instructions, educational materials, and care plan provided today and agreed to receive a mailed copy of patient instructions, educational materials, and care plan.  Telephone follow up appointment with pharmacy team member scheduled for: 4 months  Edythe Clarity, [place]

## 2021-07-06 DIAGNOSIS — G4733 Obstructive sleep apnea (adult) (pediatric): Secondary | ICD-10-CM | POA: Diagnosis not present

## 2021-07-09 DIAGNOSIS — J301 Allergic rhinitis due to pollen: Secondary | ICD-10-CM | POA: Diagnosis not present

## 2021-07-09 DIAGNOSIS — J3089 Other allergic rhinitis: Secondary | ICD-10-CM | POA: Diagnosis not present

## 2021-07-09 DIAGNOSIS — J3081 Allergic rhinitis due to animal (cat) (dog) hair and dander: Secondary | ICD-10-CM | POA: Diagnosis not present

## 2021-07-12 ENCOUNTER — Telehealth: Payer: Self-pay | Admitting: Pharmacist

## 2021-07-12 NOTE — Progress Notes (Signed)
    Chronic Care Management Pharmacy Assistant   Name: Claudia Martin  MRN: [id number] DOB: 18-Sep-1946  Reason for Encounter: CCM Care Plan  Medications: Outpatient Encounter Medications as of 07/12/2021  Medication Sig Note   aspirin 81 MG chewable tablet Chew 1 tablet (81 mg total) by mouth daily.    atorvastatin (LIPITOR) 80 MG tablet Take 1 tablet (80 mg total) by mouth daily.    cetirizine (ZYRTEC) 10 MG tablet Take 10 mg by mouth daily.    chlorhexidine (PERIDEX) 0.12 % solution Use as directed 5 mLs in the mouth or throat 2 (two) times daily.    conjugated estrogens (PREMARIN) vaginal cream Use 1 applicatorful twice weekly. (Patient taking differently: Place 1 Applicatorful vaginally 2 (two) times a week. Use just a little bit, not an applicatorful.)    EPINEPHrine 0.3 mg/0.3 mL IJ SOAJ injection as needed. 07/16/2015: Received from: [place] 0.3 % ophthalmic suspension Place 1 drop into the right eye daily.    levothyroxine (SYNTHROID) 50 MCG tablet Take 1 tablet (50 mcg total) by mouth daily before breakfast.    losartan (COZAAR) 25 MG tablet Take 1 tablet (25 mg total) by mouth daily.    metoprolol tartrate (LOPRESSOR) 25 MG tablet Take 1 tablet (25 mg total) by mouth 2 (two) times daily.    moxifloxacin (VIGAMOX) 0.5 % ophthalmic solution Place 1 drop into the right eye 3 (three) times daily.    nitroGLYCERIN (NITROSTAT) 0.4 MG SL tablet Place 1 tablet (0.4 mg total) under the tongue every 5 (five) minutes x 3 doses as needed for chest pain.    prednisoLONE acetate (PRED FORTE) 1 % ophthalmic suspension Place 1 drop into the right eye 4 (four) times daily.    valACYclovir (VALTREX) 1000 MG tablet Take 1 tablet (1,000 mg total) by mouth daily. Pt can 1 tablet twice a day if flare up for 7 days.    Zoster Vaccine Adjuvanted [facility]) injection Inject 0.5 mLs into the muscle once.    No facility-administered encounter medications on file as of  07/12/2021.   Reviewed the patients initial visit reinsured it was completed per the pharmacist Leata Mouse request. Printed the CCM care plan. Mailed the patient CCM care plan to their most recent address on file.  Follow-Up:Pharmacist Review  Charlann Lange, [place] Pharmacist Assistant [phone]

## 2021-07-16 DIAGNOSIS — Z1231 Encounter for screening mammogram for malignant neoplasm of breast: Secondary | ICD-10-CM | POA: Diagnosis not present

## 2021-07-16 DIAGNOSIS — J3089 Other allergic rhinitis: Secondary | ICD-10-CM | POA: Diagnosis not present

## 2021-07-16 DIAGNOSIS — J301 Allergic rhinitis due to pollen: Secondary | ICD-10-CM | POA: Diagnosis not present

## 2021-07-16 DIAGNOSIS — J3081 Allergic rhinitis due to animal (cat) (dog) hair and dander: Secondary | ICD-10-CM | POA: Diagnosis not present

## 2021-07-17 DIAGNOSIS — I213 ST elevation (STEMI) myocardial infarction of unspecified site: Secondary | ICD-10-CM | POA: Diagnosis not present

## 2021-07-17 DIAGNOSIS — I219 Acute myocardial infarction, unspecified: Secondary | ICD-10-CM | POA: Diagnosis not present

## 2021-07-23 DIAGNOSIS — J3089 Other allergic rhinitis: Secondary | ICD-10-CM | POA: Diagnosis not present

## 2021-07-23 DIAGNOSIS — J3081 Allergic rhinitis due to animal (cat) (dog) hair and dander: Secondary | ICD-10-CM | POA: Diagnosis not present

## 2021-07-23 DIAGNOSIS — J301 Allergic rhinitis due to pollen: Secondary | ICD-10-CM | POA: Diagnosis not present

## 2021-07-30 ENCOUNTER — Other Ambulatory Visit: Payer: Self-pay | Admitting: Internal Medicine

## 2021-07-30 DIAGNOSIS — B009 Herpesviral infection, unspecified: Secondary | ICD-10-CM

## 2021-07-30 DIAGNOSIS — J3089 Other allergic rhinitis: Secondary | ICD-10-CM | POA: Diagnosis not present

## 2021-07-30 DIAGNOSIS — J301 Allergic rhinitis due to pollen: Secondary | ICD-10-CM | POA: Diagnosis not present

## 2021-07-30 DIAGNOSIS — J3081 Allergic rhinitis due to animal (cat) (dog) hair and dander: Secondary | ICD-10-CM | POA: Diagnosis not present

## 2021-07-31 ENCOUNTER — Other Ambulatory Visit: Payer: Self-pay | Admitting: Internal Medicine

## 2021-07-31 DIAGNOSIS — B009 Herpesviral infection, unspecified: Secondary | ICD-10-CM

## 2021-07-31 DIAGNOSIS — J3089 Other allergic rhinitis: Secondary | ICD-10-CM | POA: Diagnosis not present

## 2021-07-31 DIAGNOSIS — J3081 Allergic rhinitis due to animal (cat) (dog) hair and dander: Secondary | ICD-10-CM | POA: Diagnosis not present

## 2021-08-05 ENCOUNTER — Other Ambulatory Visit: Payer: Self-pay | Admitting: Internal Medicine

## 2021-08-05 DIAGNOSIS — I10 Essential (primary) hypertension: Secondary | ICD-10-CM

## 2021-08-06 DIAGNOSIS — J3081 Allergic rhinitis due to animal (cat) (dog) hair and dander: Secondary | ICD-10-CM | POA: Diagnosis not present

## 2021-08-06 DIAGNOSIS — J301 Allergic rhinitis due to pollen: Secondary | ICD-10-CM | POA: Diagnosis not present

## 2021-08-06 DIAGNOSIS — G4733 Obstructive sleep apnea (adult) (pediatric): Secondary | ICD-10-CM | POA: Diagnosis not present

## 2021-08-06 DIAGNOSIS — J3089 Other allergic rhinitis: Secondary | ICD-10-CM | POA: Diagnosis not present

## 2021-08-13 DIAGNOSIS — J301 Allergic rhinitis due to pollen: Secondary | ICD-10-CM | POA: Diagnosis not present

## 2021-08-13 DIAGNOSIS — J3089 Other allergic rhinitis: Secondary | ICD-10-CM | POA: Diagnosis not present

## 2021-08-13 DIAGNOSIS — J3081 Allergic rhinitis due to animal (cat) (dog) hair and dander: Secondary | ICD-10-CM | POA: Diagnosis not present

## 2021-08-23 DIAGNOSIS — J3089 Other allergic rhinitis: Secondary | ICD-10-CM | POA: Diagnosis not present

## 2021-08-23 DIAGNOSIS — J3081 Allergic rhinitis due to animal (cat) (dog) hair and dander: Secondary | ICD-10-CM | POA: Diagnosis not present

## 2021-08-23 DIAGNOSIS — J301 Allergic rhinitis due to pollen: Secondary | ICD-10-CM | POA: Diagnosis not present

## 2021-08-27 DIAGNOSIS — J3089 Other allergic rhinitis: Secondary | ICD-10-CM | POA: Diagnosis not present

## 2021-08-27 DIAGNOSIS — J3081 Allergic rhinitis due to animal (cat) (dog) hair and dander: Secondary | ICD-10-CM | POA: Diagnosis not present

## 2021-08-27 DIAGNOSIS — J301 Allergic rhinitis due to pollen: Secondary | ICD-10-CM | POA: Diagnosis not present

## 2021-09-05 DIAGNOSIS — G4733 Obstructive sleep apnea (adult) (pediatric): Secondary | ICD-10-CM | POA: Diagnosis not present

## 2021-09-06 DIAGNOSIS — J301 Allergic rhinitis due to pollen: Secondary | ICD-10-CM | POA: Diagnosis not present

## 2021-09-06 DIAGNOSIS — J3089 Other allergic rhinitis: Secondary | ICD-10-CM | POA: Diagnosis not present

## 2021-09-06 DIAGNOSIS — J3081 Allergic rhinitis due to animal (cat) (dog) hair and dander: Secondary | ICD-10-CM | POA: Diagnosis not present

## 2021-09-09 ENCOUNTER — Other Ambulatory Visit: Payer: Self-pay | Admitting: Internal Medicine

## 2021-09-09 ENCOUNTER — Ambulatory Visit: Payer: Self-pay | Admitting: Student-PharmD

## 2021-09-09 DIAGNOSIS — E039 Hypothyroidism, unspecified: Secondary | ICD-10-CM

## 2021-09-09 DIAGNOSIS — I10 Essential (primary) hypertension: Secondary | ICD-10-CM

## 2021-09-09 NOTE — Progress Notes (Signed)
Hypertension (HTN) Review Call   Claudia Martin, Claudia Martin [id number] 31 years, Female  DOB: 18-Sep-1946  M: [phone]  Hypertension Review  Completed by Charlann Lange on 09/09/2021  Chart Review Is the patient enrolled in RPM with BP Monitor?: No BP #1 reading (last): 128/78 on: 06/30/2021 BP #2 reading: 114/73 on: 06/12/2021 BP #3 reading: 130/80 on: 05/30/2021 Any of the last 3 BP > 140/90 mmHg?: No What recent interventions/DTPs have been made by any provider to improve the patient's conditions in the last 3 months?:  07/16/21 [facility] For Screening mammogram for breast cancer   07/17/21 Cardiology [place], Montey Hora, MD. For Myocardial infarction, No medication changes.  Any recent hospitalizations or ED visits since last visit with CPP?: No  Adherence Review Adherence rates for STAR metric medications: Atorvastatin 80 mg 07/02/21 90 DS Losartan 25 mg 07/05/21 90 DS Adherence rates for medications indicated for disease state being reviewed: Losartan 25 mg 07/05/21 90 DS Does the patient have >5 day gap between last estimated fill dates for any of the above medications?: No  Disease State Questions Able to connect with the Patient?: Yes Is the patient monitoring his/her BP?: No Review recommendations from CPP's note of how often patient should be checking and encourage monitoring blood pressures if patient has history of high BP.: Done Educate patient to inform proper points on checking BP at home:: When taking resting blood pressure: sit quietly for 5 minutes, not within 30 min. of exercising, no talking., Do not drink caffeine or smoke a cigarette at least 30 min. prior to checking., Sit with feet flat on the floor, arm at heart level., Make sure using the right size cuff, the length of the cuff's bladder should be at least equal to 75% of the circumference of the upper arm. What diet changes have you made to  improve your Blood Pressure Control?: limiting / monitoring salt intake, eating more home-cooked meals What exercise are you doing to improve your Blood Pressure Control?: no formal exercise Misc. Response/Information:: Patient stated her eyes have been bothering her for about two weeks and are not getting any better. She stated she's been putting medication in them but not much as changed. She stated her eyes are itching, watery, and a little blurry. I advised her to call her eye doctor today as soon as possible and get appointment scheduled. She stated she would call once we got off the phone, I informed her to not wipe her eye, simply dab it, and I also advised her to lay on a new pillow case daily simply because we don't want the germs to get on her worse.   Pharmacist Review Adherence gaps identified?: No Drug Therapy Problems identified?: No Assessment: Controlled  10 minutes spent in review, coordination, and documentation.  Reviewed by: Alena Bills, PharmD Clinical Pharmacist [phone]

## 2021-09-12 ENCOUNTER — Ambulatory Visit (INDEPENDENT_AMBULATORY_CARE_PROVIDER_SITE_OTHER): Payer: Medicare Other | Admitting: Nurse Practitioner

## 2021-09-12 ENCOUNTER — Encounter: Payer: Self-pay | Admitting: Nurse Practitioner

## 2021-09-12 ENCOUNTER — Other Ambulatory Visit: Payer: Self-pay

## 2021-09-12 VITALS — BP 138/69 | HR 77 | Temp 98.3°F | Resp 16 | Ht <= 58 in | Wt 176.0 lb

## 2021-09-12 DIAGNOSIS — J3089 Other allergic rhinitis: Secondary | ICD-10-CM | POA: Diagnosis not present

## 2021-09-12 DIAGNOSIS — J3081 Allergic rhinitis due to animal (cat) (dog) hair and dander: Secondary | ICD-10-CM | POA: Diagnosis not present

## 2021-09-12 DIAGNOSIS — J301 Allergic rhinitis due to pollen: Secondary | ICD-10-CM | POA: Diagnosis not present

## 2021-09-12 DIAGNOSIS — R6889 Other general symptoms and signs: Secondary | ICD-10-CM | POA: Diagnosis not present

## 2021-09-12 DIAGNOSIS — I10 Essential (primary) hypertension: Secondary | ICD-10-CM | POA: Diagnosis not present

## 2021-09-12 DIAGNOSIS — Z955 Presence of coronary angioplasty implant and graft: Secondary | ICD-10-CM | POA: Diagnosis not present

## 2021-09-12 NOTE — Progress Notes (Signed)
[facility] [place], [place] [address]  Internal MEDICINE  Office Visit Note  Patient Name: Claudia Martin  [id number]  [id number]  Date of Service: 09/12/2021  Chief Complaint  Patient presents with   Follow-up   Gastroesophageal Reflux   Hypertension    HPI Claudia Martin presents for a follow up visit for hypertension and GERD. Her blood pressure is well controlled with current medications. She sees cardiology and is taking brillinta due to coronary stent that was placed when she was hospitalized for STEMI. The STEMI occurred in September and she had a hospital follow up visit with Drema Dallas PA-C after she was discharged from the hospital and was seen by Dr. Clayborn Bigness, cardiology, for initial consult on 07/01/21.  She has been having a problem with itchy eyes. Has no known environmental allergies according to medical history and patient report. She denies any eye pain or drainage. She denies any changes in her vision or foreign body in her eyes.     Current Medication: Outpatient Encounter Medications as of 09/12/2021  Medication Sig Note   aspirin 81 MG chewable tablet Chew 1 tablet (81 mg total) by mouth daily.    cetirizine (ZYRTEC) 10 MG tablet Take 10 mg by mouth daily.    chlorhexidine (PERIDEX) 0.12 % solution Use as directed 5 mLs in the mouth or throat 2 (two) times daily.    conjugated estrogens (PREMARIN) vaginal cream Use 1 applicatorful twice weekly. (Patient taking differently: Place 1 Applicatorful vaginally 2 (two) times a week. Use just a little bit, not an applicatorful.)    EPINEPHrine 0.3 mg/0.3 mL IJ SOAJ injection as needed. 07/16/2015: Received from: [facility] 0.3 % ophthalmic suspension Place 1 drop into the right eye daily.    levothyroxine (SYNTHROID) 50 MCG tablet TAKE 1 TABLET(50 MCG) BY MOUTH DAILY BEFORE AND BREAKFAST    moxifloxacin (VIGAMOX) 0.5 % ophthalmic solution Place 1 drop into the  right eye 3 (three) times daily.    prednisoLONE acetate (PRED FORTE) 1 % ophthalmic suspension Place 1 drop into the right eye 4 (four) times daily.    valACYclovir (VALTREX) 1000 MG tablet TAKE 1 TABLET(1000 MG) BY MOUTH DAILY. CAN TAKE 1 TABLET TWICE DAILY IF FLARE UP FOR 7 DAYS    Zoster Vaccine Adjuvanted [facility]) injection Inject 0.5 mLs into the muscle once.    atorvastatin (LIPITOR) 80 MG tablet Take 1 tablet (80 mg total) by mouth daily.    losartan (COZAAR) 25 MG tablet Take 1 tablet (25 mg total) by mouth daily.    metoprolol tartrate (LOPRESSOR) 25 MG tablet Take 1 tablet (25 mg total) by mouth 2 (two) times daily.    nitroGLYCERIN (NITROSTAT) 0.4 MG SL tablet Place 1 tablet (0.4 mg total) under the tongue every 5 (five) minutes x 3 doses as needed for chest pain.    No facility-administered encounter medications on file as of 09/12/2021.    Surgical History: Past Surgical History:  Procedure Laterality Date   BREAST BIOPSY     CATARACT EXTRACTION W/PHACO Right 06/05/2021   Procedure: CATARACT EXTRACTION PHACO AND INTRAOCULAR LENS PLACEMENT ([place]) RIGHT;  Surgeon: Leandrew Koyanagi, MD;  Location: [place];  Service: Ophthalmology;  Laterality: Right;  3.02 00:39.5   COLON SURGERY     COLONOSCOPY WITH PROPOFOL N/A 03/15/2018   Procedure: COLONOSCOPY WITH PROPOFOL;  Surgeon: Lin Landsman, MD;  Location: [facility] ENDOSCOPY;  Service: Gastroenterology;  Laterality: N/A;   COLONOSCOPY  WITH PROPOFOL N/A 01/09/2021   Procedure: COLONOSCOPY WITH PROPOFOL;  Surgeon: Lin Landsman, MD;  [facility] ENDOSCOPY;  Service: Gastroenterology;  Laterality: N/A;   CORONARY/GRAFT ACUTE MI REVASCULARIZATION N/A 06/06/2021   Procedure: Coronary/Graft Acute MI Revascularization;  Surgeon: Yolonda Kida, MD;  Location: [place] [facility];  Service: Cardiovascular;  Laterality: N/A;   diverticulitis     ECTOPIC PREGNANCY SURGERY     fx thumb     HERNIA REPAIR     LEFT  HEART CATH AND CORONARY ANGIOGRAPHY N/A 06/06/2021   Procedure: LEFT HEART CATH AND CORONARY ANGIOGRAPHY;  Surgeon: Yolonda Kida, MD;  Location: [place] [facility];  Service: Cardiovascular;  Laterality: N/A;    Medical History: Past Medical History:  Diagnosis Date   Abdominal pain    Allergic rhinitis    Arthritis    Candidiasis of vulva and vagina    Cellulitis and abscess    Diverticulosis    Dysuria    Enlarged heart    Eyelid inflammation    GERD (gastroesophageal reflux disease)    Hematuria syndrome    Hemorrhoids    Herpes genitalis    Herpes simplex    HTN (hypertension)    Hydronephrosis    Hypothyroidism    Incomplete bladder emptying    Internal hordeolum    Knee pain    Otalgia    Primary ovarian failure    Sciatica    Shortness of breath    Sinus bradycardia    Sleep apnea    uses CPAP   Urethral diverticulum    UTI (lower urinary tract infection)    Vaginal atrophy    Vaginitis     Family History: Family History  Problem Relation Age of Onset   Tuberculosis Mother    Nephrolithiasis Brother     Social History   Socioeconomic History   Marital status: Divorced    Spouse name: Not on file   Number of children: Not on file   Years of education: Not on file   Highest education level: Not on file  Occupational History   Not on file  Tobacco Use   Smoking status: Never   Smokeless tobacco: Never  Vaping Use   Vaping Use: Never used  Substance and Sexual Activity   Alcohol use: No   Drug use: Never   Sexual activity: Not on file  Other Topics Concern   Not on file  Social History Narrative   Not on file   Social Determinants of Health   Financial Resource Strain: Not on file  Food Insecurity: Not on file  Transportation Needs: Not on file  Physical Activity: Not on file  Stress: Not on file  Social Connections: Not on file  Intimate Partner Violence: Not on file      Review of Systems  Constitutional:  Negative for  chills, fatigue and unexpected weight change.  HENT:  Negative for congestion, ear pain, postnasal drip, rhinorrhea, sinus pressure, sinus pain, sneezing and sore throat.   Eyes:  Positive for itching. Negative for photophobia, pain, discharge, redness and visual disturbance.  Respiratory:  Negative for cough, chest tightness and shortness of breath.   Cardiovascular:  Negative for chest pain and palpitations.  Gastrointestinal:  Negative for abdominal pain, constipation, diarrhea, nausea and vomiting.  Genitourinary:  Negative for dysuria and frequency.  Musculoskeletal:  Negative for arthralgias, back pain, joint swelling and neck pain.  Skin:  Negative for rash.  Neurological: Negative.  Negative for  tremors and numbness.  Hematological:  Negative for adenopathy. Does not bruise/bleed easily.  Psychiatric/Behavioral:  Negative for behavioral problems (Depression), sleep disturbance and suicidal ideas. The patient is not nervous/anxious.    Vital Signs: BP 138/69   Pulse 77   Temp 98.3 F (36.8 C)   Resp 16   Ht 4\' 8"  (1.422 m)   Wt 176 lb (79.8 kg)   SpO2 97%   BMI 39.46 kg/m    Physical Exam Vitals reviewed.  Constitutional:      General: She is not in acute distress.    Appearance: Normal appearance. She is obese. She is not ill-appearing.  HENT:     Head: Normocephalic and atraumatic.  Eyes:     General: Lids are normal. Vision grossly intact. Gaze aligned appropriately. No allergic shiner.       Right eye: No foreign body, discharge or hordeolum.        Left eye: No foreign body, discharge or hordeolum.     Pupils: Pupils are equal, round, and reactive to light.  Cardiovascular:     Rate and Rhythm: Normal rate and regular rhythm.  Pulmonary:     Effort: Pulmonary effort is normal. No respiratory distress.  Neurological:     Mental Status: She is alert and oriented to person, place, and time.     Cranial Nerves: No cranial nerve deficit.     Coordination:  Coordination normal.     Gait: Gait normal.  Psychiatric:        Mood and Affect: Mood normal.        Behavior: Behavior normal.       Assessment/Plan: 1. Essential hypertension Blood pressure is well controlled with current medications. Taking metoprolol and losartan.   2. S/P coronary artery stent placement Followed by cardiology. Taking brillinta due to coronary stent placement in September.   3. Itchy eyes Recommended that patient try OTC ketotifen eye drops. (Brand name is [employer]).   General Counseling: denese mentink understanding of the findings of todays visit and agrees with plan of treatment. I have discussed any further diagnostic evaluation that may be needed or ordered today. We also reviewed her medications today. she has been encouraged to call the office with any questions or concerns that should arise related to todays visit.    No orders of the defined types were placed in this encounter.   No orders of the defined types were placed in this encounter.   Return in 3 months (on 12/02/2021) for previously scheduled, CPE, [place] PCP.   Total time spent:30 Minutes Time spent includes review of chart, medications, test results, and follow up plan with the patient.   [place] Controlled Substance Database was reviewed by me.  This patient was seen by Jonetta Osgood, FNP-C in collaboration with Dr. Clayborn Bigness as a part of collaborative care agreement.   Bright Spielmann R. Valetta Fuller, MSN, FNP-C Internal medicine

## 2021-09-20 DIAGNOSIS — J301 Allergic rhinitis due to pollen: Secondary | ICD-10-CM | POA: Diagnosis not present

## 2021-09-20 DIAGNOSIS — J3081 Allergic rhinitis due to animal (cat) (dog) hair and dander: Secondary | ICD-10-CM | POA: Diagnosis not present

## 2021-09-20 DIAGNOSIS — J3089 Other allergic rhinitis: Secondary | ICD-10-CM | POA: Diagnosis not present

## 2021-09-25 ENCOUNTER — Other Ambulatory Visit: Payer: Self-pay

## 2021-09-25 ENCOUNTER — Ambulatory Visit (INDEPENDENT_AMBULATORY_CARE_PROVIDER_SITE_OTHER): Payer: Medicare Other

## 2021-09-25 DIAGNOSIS — G4733 Obstructive sleep apnea (adult) (pediatric): Secondary | ICD-10-CM

## 2021-09-25 NOTE — Progress Notes (Signed)
95 percentile pressure 7   95th percentile leak 0.2   apnea index 4.6 /hr  apnea-hypopnea index  7.9 /hr   total days used  >4 hr 90 days  total days used <4 hr 0 days  Total compliance 100 percent  She is doing great no problems or questions    Pt was seen by Claiborne Billings  RRT/RCP  from [employer]

## 2021-09-26 DIAGNOSIS — J301 Allergic rhinitis due to pollen: Secondary | ICD-10-CM | POA: Diagnosis not present

## 2021-09-26 DIAGNOSIS — J3089 Other allergic rhinitis: Secondary | ICD-10-CM | POA: Diagnosis not present

## 2021-09-26 DIAGNOSIS — E782 Mixed hyperlipidemia: Secondary | ICD-10-CM | POA: Diagnosis not present

## 2021-09-26 DIAGNOSIS — I2121 ST elevation (STEMI) myocardial infarction involving left circumflex coronary artery: Secondary | ICD-10-CM | POA: Diagnosis not present

## 2021-09-26 DIAGNOSIS — I10 Essential (primary) hypertension: Secondary | ICD-10-CM | POA: Diagnosis not present

## 2021-09-26 DIAGNOSIS — J3081 Allergic rhinitis due to animal (cat) (dog) hair and dander: Secondary | ICD-10-CM | POA: Diagnosis not present

## 2021-09-26 DIAGNOSIS — G4733 Obstructive sleep apnea (adult) (pediatric): Secondary | ICD-10-CM | POA: Diagnosis not present

## 2021-09-26 DIAGNOSIS — I2510 Atherosclerotic heart disease of native coronary artery without angina pectoris: Secondary | ICD-10-CM | POA: Diagnosis not present

## 2021-10-01 DIAGNOSIS — G4733 Obstructive sleep apnea (adult) (pediatric): Secondary | ICD-10-CM | POA: Diagnosis not present

## 2021-10-02 ENCOUNTER — Ambulatory Visit: Payer: Medicare Other

## 2021-10-03 DIAGNOSIS — J3089 Other allergic rhinitis: Secondary | ICD-10-CM | POA: Diagnosis not present

## 2021-10-03 DIAGNOSIS — J3081 Allergic rhinitis due to animal (cat) (dog) hair and dander: Secondary | ICD-10-CM | POA: Diagnosis not present

## 2021-10-03 DIAGNOSIS — J301 Allergic rhinitis due to pollen: Secondary | ICD-10-CM | POA: Diagnosis not present

## 2021-10-04 DIAGNOSIS — E039 Hypothyroidism, unspecified: Secondary | ICD-10-CM

## 2021-10-04 DIAGNOSIS — I10 Essential (primary) hypertension: Secondary | ICD-10-CM

## 2021-10-06 DIAGNOSIS — G4733 Obstructive sleep apnea (adult) (pediatric): Secondary | ICD-10-CM | POA: Diagnosis not present

## 2021-10-08 DIAGNOSIS — J301 Allergic rhinitis due to pollen: Secondary | ICD-10-CM | POA: Diagnosis not present

## 2021-10-08 DIAGNOSIS — J3089 Other allergic rhinitis: Secondary | ICD-10-CM | POA: Diagnosis not present

## 2021-10-08 DIAGNOSIS — J3081 Allergic rhinitis due to animal (cat) (dog) hair and dander: Secondary | ICD-10-CM | POA: Diagnosis not present

## 2021-10-10 ENCOUNTER — Encounter: Payer: Self-pay | Admitting: Physician Assistant

## 2021-10-10 ENCOUNTER — Telehealth: Payer: Self-pay

## 2021-10-10 ENCOUNTER — Other Ambulatory Visit: Payer: Self-pay

## 2021-10-10 ENCOUNTER — Ambulatory Visit (INDEPENDENT_AMBULATORY_CARE_PROVIDER_SITE_OTHER): Payer: Commercial Managed Care - HMO | Admitting: Physician Assistant

## 2021-10-10 VITALS — BP 125/77 | HR 90 | Temp 97.8°F | Resp 16 | Ht <= 58 in | Wt 177.2 lb

## 2021-10-10 DIAGNOSIS — E669 Obesity, unspecified: Secondary | ICD-10-CM | POA: Diagnosis not present

## 2021-10-10 DIAGNOSIS — I10 Essential (primary) hypertension: Secondary | ICD-10-CM

## 2021-10-10 DIAGNOSIS — G4733 Obstructive sleep apnea (adult) (pediatric): Secondary | ICD-10-CM | POA: Diagnosis not present

## 2021-10-10 DIAGNOSIS — Z7189 Other specified counseling: Secondary | ICD-10-CM | POA: Diagnosis not present

## 2021-10-10 NOTE — Progress Notes (Addendum)
[facility] [place], [place] [address]  Pulmonary Sleep Medicine   Office Visit Note  Patient Name: Claudia Martin DOB: May 13, 1946 MRN [id number]  Date of Service: 10/10/2021  Complaints/HPI: Pt is here for routine follow up for OSA on CPAP. Complains of dryness in eyes from air blowing since switching to new machine and she also has higher AHI. She states it doesn't really leak but the exhalation air is blowing into eyes now unlike before and claims it is the same mask style. She is 100% compliant but AHI is now at 7.9/hr. Will reach out to [place] to confirm no change in mask or consider changing mask style. Will consider change to APAP 5-10 in case she needs more pressure to control AHI. Otherwise patient states she is still benefiting from use and denies any SOB or dry mouth/nose.  ROS  General: (-) fever, (-) chills, (-) night sweats, (-) weakness Skin: (-) rashes, (-) itching,. Eyes: (-) visual changes, (-) redness, (-) itching. Nose and Sinuses: (-) nasal stuffiness or itchiness, (-) postnasal drip, (-) nosebleeds, (-) sinus trouble. Mouth and Throat: (-) sore throat, (-) hoarseness. Neck: (-) swollen glands, (-) enlarged thyroid, (-) neck pain. Respiratory: - cough, (-) bloody sputum, - shortness of breath, - wheezing. Cardiovascular: - ankle swelling, (-) chest pain. Lymphatic: (-) lymph node enlargement. Neurologic: (-) numbness, (-) tingling. Psychiatric: (-) anxiety, (-) depression   Current Medication: Outpatient Encounter Medications as of 10/10/2021  Medication Sig Note   aspirin 81 MG chewable tablet Chew 1 tablet (81 mg total) by mouth daily.    cetirizine (ZYRTEC) 10 MG tablet Take 10 mg by mouth daily.    chlorhexidine (PERIDEX) 0.12 % solution Use as directed 5 mLs in the mouth or throat 2 (two) times daily.    conjugated estrogens (PREMARIN) vaginal cream Use 1 applicatorful twice weekly. (Patient taking differently: Place 1  Applicatorful vaginally 2 (two) times a week. Use just a little bit, not an applicatorful.)    EPINEPHrine 0.3 mg/0.3 mL IJ SOAJ injection as needed. 07/16/2015: Received from: [place] 0.3 % ophthalmic suspension Place 1 drop into the right eye daily.    levothyroxine (SYNTHROID) 50 MCG tablet TAKE 1 TABLET(50 MCG) BY MOUTH DAILY BEFORE AND BREAKFAST    moxifloxacin (VIGAMOX) 0.5 % ophthalmic solution Place 1 drop into the right eye 3 (three) times daily.    prednisoLONE acetate (PRED FORTE) 1 % ophthalmic suspension Place 1 drop into the right eye 4 (four) times daily.    valACYclovir (VALTREX) 1000 MG tablet TAKE 1 TABLET(1000 MG) BY MOUTH DAILY. CAN TAKE 1 TABLET TWICE DAILY IF FLARE UP FOR 7 DAYS    Zoster Vaccine Adjuvanted [facility]) injection Inject 0.5 mLs into the muscle once.    atorvastatin (LIPITOR) 80 MG tablet Take 1 tablet (80 mg total) by mouth daily.    losartan (COZAAR) 25 MG tablet Take 1 tablet (25 mg total) by mouth daily.    metoprolol tartrate (LOPRESSOR) 25 MG tablet Take 1 tablet (25 mg total) by mouth 2 (two) times daily.    nitroGLYCERIN (NITROSTAT) 0.4 MG SL tablet Place 1 tablet (0.4 mg total) under the tongue every 5 (five) minutes x 3 doses as needed for chest pain.    No facility-administered encounter medications on file as of 10/10/2021.    Surgical History: Past Surgical History:  Procedure Laterality Date   BREAST BIOPSY     CATARACT EXTRACTION W/PHACO Right 06/05/2021  Procedure: CATARACT EXTRACTION PHACO AND INTRAOCULAR LENS PLACEMENT ([place]) RIGHT;  Surgeon: Leandrew Koyanagi, MD;  Location: [place];  Service: Ophthalmology;  Laterality: Right;  3.02 00:39.5   COLON SURGERY     COLONOSCOPY WITH PROPOFOL N/A 03/15/2018   Procedure: COLONOSCOPY WITH PROPOFOL;  Surgeon: Lin Landsman, MD;  Location: [facility] ENDOSCOPY;  Service: Gastroenterology;  Laterality: N/A;   COLONOSCOPY WITH PROPOFOL N/A 01/09/2021    Procedure: COLONOSCOPY WITH PROPOFOL;  Surgeon: Lin Landsman, MD;  Location: [facility] ENDOSCOPY;  Service: Gastroenterology;  Laterality: N/A;   CORONARY/GRAFT ACUTE MI REVASCULARIZATION N/A 06/06/2021   Procedure: Coronary/Graft Acute MI Revascularization;  Surgeon: Yolonda Kida, MD;  Location: [place] CV LAB;  Service: Cardiovascular;  Laterality: N/A;   diverticulitis     ECTOPIC PREGNANCY SURGERY     fx thumb     HERNIA REPAIR     LEFT HEART CATH AND CORONARY ANGIOGRAPHY N/A 06/06/2021   Procedure: LEFT HEART CATH AND CORONARY ANGIOGRAPHY;  Surgeon: Yolonda Kida, MD;  Location: [place] CV LAB;  Service: Cardiovascular;  Laterality: N/A;    Medical History: Past Medical History:  Diagnosis Date   Abdominal pain    Allergic rhinitis    Arthritis    Candidiasis of vulva and vagina    Cellulitis and abscess    Diverticulosis    Dysuria    Enlarged heart    Eyelid inflammation    GERD (gastroesophageal reflux disease)    Hematuria syndrome    Hemorrhoids    Herpes genitalis    Herpes simplex    HTN (hypertension)    Hydronephrosis    Hypothyroidism    Incomplete bladder emptying    Internal hordeolum    Knee pain    Otalgia    Primary ovarian failure    Sciatica    Shortness of breath    Sinus bradycardia    Sleep apnea    uses CPAP   Urethral diverticulum    UTI (lower urinary tract infection)    Vaginal atrophy    Vaginitis     Family History: Family History  Problem Relation Age of Onset   Tuberculosis Mother    Nephrolithiasis Brother     Social History: Social History   Socioeconomic History   Marital status: Divorced    Spouse name: Not on file   Number of children: Not on file   Years of education: Not on file   Highest education level: Not on file  Occupational History   Not on file  Tobacco Use   Smoking status: Never   Smokeless tobacco: Never  Vaping Use   Vaping Use: Never used  Substance and Sexual Activity    Alcohol use: No   Drug use: Never   Sexual activity: Not on file  Other Topics Concern   Not on file  Social History Narrative   Not on file   Social Determinants of Health   Financial Resource Strain: Not on file  Food Insecurity: Not on file  Transportation Needs: Not on file  Physical Activity: Not on file  Stress: Not on file  Social Connections: Not on file  Intimate Partner Violence: Not on file    Vital Signs: Blood pressure 125/77, pulse 90, temperature 97.8 F (36.6 C), resp. rate 16, height 4\' 10"  (1.473 m), weight 177 lb 3.2 oz (80.4 kg), SpO2 99 %.  Examination: General Appearance: The patient is well-developed, well-nourished, and in no distress. Skin: Gross inspection of skin unremarkable.  Head: normocephalic, no gross deformities. Eyes: no gross deformities noted. ENT: ears appear grossly normal no exudates. Neck: Supple. No thyromegaly. No LAD. Respiratory: Lungs clear to auscultation bilaterally. Cardiovascular: Normal S1 and S2 without murmur or rub. Extremities: No cyanosis. pulses are equal. Neurologic: Alert and oriented. No involuntary movements.  LABS: No results found for this or any previous visit (from the past 2160 hour(s)).  Radiology: ECHOCARDIOGRAM COMPLETE  Result Date: 06/07/2021    ECHOCARDIOGRAM REPORT   Patient Name:   HADESSAH GRENNAN Date of Exam: 06/07/2021 Medical Rec #:  [id number]               Height:       55.0 in Accession #:    [id number]              Weight:       184.7 lb Date of Birth:  07-13-1946               BSA:          1.694 m Patient Age:    58 years                BP:           129/76 mmHg Patient Gender: F                       HR:           82 bpm. Exam Location:  [facility] Procedure: 2D Echo, Color Doppler, Cardiac Doppler and Intracardiac            Opacification Agent Indications:     I21.9 Acute myocardial infarction, unspecified  History:         Patient has no prior history of Echocardiogram examinations.                   Signs/Symptoms:Shortness of Breath; Risk Factors:Hypertension                  and Sleep Apnea.  Sonographer:     Charmayne Sheer Referring Phys:  [place] Diagnosing Phys: Neoma Laming  Sonographer Comments: Suboptimal apical window. IMPRESSIONS  1. Left ventricular ejection fraction, by estimation, is 55 to 60%. The left ventricle has normal function. The left ventricle has no regional wall motion abnormalities. Left ventricular diastolic parameters are consistent with Grade I diastolic dysfunction (impaired relaxation).  2. Right ventricular systolic function is normal. The right ventricular size is normal.  3. The mitral valve is degenerative. Trivial mitral valve regurgitation. No evidence of mitral stenosis.  4. The aortic valve is normal in structure. Aortic valve regurgitation is not visualized. Mild aortic valve sclerosis is present, with no evidence of aortic valve stenosis.  5. The inferior vena cava is normal in size with greater than 50% respiratory variability, suggesting right atrial pressure of 3 mmHg. FINDINGS  Left Ventricle: Left ventricular ejection fraction, by estimation, is 55 to 60%. The left ventricle has normal function. The left ventricle has no regional wall motion abnormalities. Definity contrast agent was given IV to delineate the left ventricular  endocardial borders. The left ventricular internal cavity size was normal in size. There is no left ventricular hypertrophy. Left ventricular diastolic parameters are consistent with Grade I diastolic dysfunction (impaired relaxation). Right Ventricle: The right ventricular size is normal. No increase in right ventricular wall thickness. Right ventricular systolic function is normal. Left Atrium: Left atrial size was normal in size. Right  Atrium: Right atrial size was normal in size. Pericardium: There is no evidence of pericardial effusion. Mitral Valve: The mitral valve is degenerative in appearance. Mild to moderate  mitral annular calcification. Trivial mitral valve regurgitation. No evidence of mitral valve stenosis. MV peak gradient, 5.4 mmHg. The mean mitral valve gradient is 2.0 mmHg. Tricuspid Valve: The tricuspid valve is normal in structure. Tricuspid valve regurgitation is not demonstrated. No evidence of tricuspid stenosis. Aortic Valve: The aortic valve is normal in structure. Aortic valve regurgitation is not visualized. Mild aortic valve sclerosis is present, with no evidence of aortic valve stenosis. Aortic valve mean gradient measures 2.0 mmHg. Aortic valve peak gradient measures 5.4 mmHg. Aortic valve area, by VTI measures 2.39 cm. Pulmonic Valve: The pulmonic valve was normal in structure. Pulmonic valve regurgitation is not visualized. No evidence of pulmonic stenosis. Aorta: The aortic root is normal in size and structure. Venous: The inferior vena cava is normal in size with greater than 50% respiratory variability, suggesting right atrial pressure of 3 mmHg. IAS/Shunts: No atrial level shunt detected by color flow Doppler.  LEFT VENTRICLE PLAX 2D LVIDd:         4.32 cm  Diastology LVIDs:         3.33 cm  LV e' medial:    5.98 cm/s LV PW:         1.09 cm  LV E/e' medial:  7.3 LV IVS:        0.86 cm  LV e' lateral:   4.46 cm/s LVOT diam:     1.90 cm  LV E/e' lateral: 9.8 LV SV:         44 LV SV Index:   26 LVOT Area:     2.84 cm  LEFT ATRIUM             Index LA diam:        4.30 cm 2.54 cm/m LA Vol (A2C):   30.3 ml 17.88 ml/m LA Vol (A4C):   28.9 ml 17.06 ml/m LA Biplane Vol: 29.6 ml 17.47 ml/m  AORTIC VALVE                   PULMONIC VALVE AV Area (Vmax):    2.11 cm    PV Vmax:       1.10 m/s AV Area (Vmean):   2.21 cm    PV Vmean:      74.800 cm/s AV Area (VTI):     2.39 cm    PV VTI:        0.215 m AV Vmax:           116.00 cm/s PV Peak grad:  4.8 mmHg AV Vmean:          72.500 cm/s PV Mean grad:  2.0 mmHg AV VTI:            0.185 m AV Peak Grad:      5.4 mmHg AV Mean Grad:      2.0 mmHg LVOT  Vmax:         86.20 cm/s LVOT Vmean:        56.400 cm/s LVOT VTI:          0.156 m LVOT/AV VTI ratio: 0.84  AORTA Ao Root diam: 2.70 cm MITRAL VALVE MV Area (PHT): 6.77 cm     SHUNTS MV Area VTI:   2.62 cm     Systemic VTI:  0.16 m MV Peak grad:  5.4 mmHg  Systemic Diam: 1.90 cm MV Mean grad:  2.0 mmHg MV Vmax:       1.16 m/s MV Vmean:      61.8 cm/s MV Decel Time: 112 msec MV E velocity: 43.70 cm/s MV A velocity: 103.00 cm/s MV E/A ratio:  0.42 Shaukat Khan Electronically signed by Neoma Laming Signature Date/Time: 06/07/2021/3:12:50 PM    Final     No results found.  No results found.    Assessment and Plan: Patient Active Problem List   Diagnosis Date Noted   STEMI (ST elevation myocardial infarction) ([place]) 06/06/2021   History of colonic polyps    Urinary tract infection without hematuria 01/04/2020   Candidiasis of skin 01/04/2020   Ventral hernia without obstruction or gangrene 11/28/2019   Herpes simplex infection 10/27/2019   Stomatitis and mucositis 10/27/2019   Encounter for general adult medical examination with abnormal findings 10/27/2019   Vitamin B12 deficiency 06/30/2018   Unspecified menopausal and perimenopausal disorder 06/30/2018   Vitamin D deficiency 06/30/2018   Flu vaccine need 06/30/2018   Urinary tract infection with hematuria 02/05/2018   Acquired hypothyroidism 02/05/2018   Essential hypertension 02/05/2018   Screening for breast cancer 02/05/2018   Dysuria 02/05/2018   Obstructive sleep apnea (adult) (pediatric) 01/06/2018    1. Obstructive sleep apnea Continue excellent compliance, will have kelly reach out to patient regarding mask issues and change pressure to APAP 5-10 and see if this helps control AHI.  2. CPAP use counseling CPAP couseling-Discussed importance of adequate CPAP use as well as proper care and cleaning techniques of machine and all supplies.  3. Essential hypertension Well controlled, continue current medication  4. Obesity  (BMI 35.0-39.9 without comorbidity) Obesity Counseling: Had a lengthy discussion regarding patients BMI and weight issues. Patient was instructed on portion control as well as increased activity. Also discussed caloric restrictions with trying to maintain intake less than 2000 Kcal. Discussions were made in accordance with the 5As of weight management. Simple actions such as not eating late and if able to, taking a walk is suggested.    General Counseling: I have discussed the findings of the evaluation and examination with Verdis Frederickson.  I have also discussed any further diagnostic evaluation thatmay be needed or ordered today. Mellonie verbalizes understanding of the findings of todays visit. We also reviewed her medications today and discussed drug interactions and side effects including but not limited excessive drowsiness and altered mental states. We also discussed that there is always a risk not just to her but also people around her. she has been encouraged to call the office with any questions or concerns that should arise related to todays visit.  Orders Placed This Encounter  Procedures   For home use only DME continuous positive airway pressure (CPAP)    Change settings on current machine to APAP 5-10    Order Specific Question:   Length of Need    Answer:   Lifetime    Order Specific Question:   Patient has OSA or probable OSA    Answer:   Yes    Order Specific Question:   Is the patient currently using CPAP in the home    Answer:   Yes    Order Specific Question:   Settings    Answer:   5-10    Order Specific Question:   CPAP supplies needed    Answer:   Mask, headgear, cushions, filters, heated tubing and water chamber     Time spent: 30  I  have personally obtained a history, examined the patient, evaluated laboratory and imaging results, formulated the assessment and plan and placed orders. This patient was seen by Drema Dallas, PA-C in collaboration with Dr. Devona Konig as a part  of collaborative care agreement.     Allyne Gee, MD [facility] Pulmonary and Critical Care Sleep medicine

## 2021-10-10 NOTE — Telephone Encounter (Signed)
-----   Message from Mylinda Latina, PA-C sent at 10/10/2021 11:49 AM EST ----- Can you please reach out to [facility] for this patient. Her AHI is now 7.7 when previously was well controlled at 3.3, additionally she is having a lot of problems with air blowing in eyes and drying her eyes out. States this only started since getting on new machine and I am questioning if there was a change in mask style or something that has contributed to this. If her seals are good with no leak but AHI still that high may need to increase pressure.  Thanks!

## 2021-10-10 NOTE — Patient Instructions (Signed)

## 2021-10-10 NOTE — Telephone Encounter (Signed)
Send message  to [facility] that we put cpap order to pressure check also gave courtney pt need 1 month follow appt with kelly

## 2021-10-10 NOTE — Addendum Note (Signed)
Addended by: Drema Dallas on: 10/10/2021 04:04 PM   Modules accepted: Orders

## 2021-10-15 ENCOUNTER — Telehealth: Payer: Self-pay

## 2021-10-15 DIAGNOSIS — J3081 Allergic rhinitis due to animal (cat) (dog) hair and dander: Secondary | ICD-10-CM | POA: Diagnosis not present

## 2021-10-15 DIAGNOSIS — J301 Allergic rhinitis due to pollen: Secondary | ICD-10-CM | POA: Diagnosis not present

## 2021-10-15 DIAGNOSIS — J3089 Other allergic rhinitis: Secondary | ICD-10-CM | POA: Diagnosis not present

## 2021-10-15 NOTE — Telephone Encounter (Signed)
Patient scheduled with Claiborne Billings from [facility] for 11-20-21. Patient has been advised.

## 2021-10-24 DIAGNOSIS — J301 Allergic rhinitis due to pollen: Secondary | ICD-10-CM | POA: Diagnosis not present

## 2021-10-24 DIAGNOSIS — J3089 Other allergic rhinitis: Secondary | ICD-10-CM | POA: Diagnosis not present

## 2021-10-24 DIAGNOSIS — J3081 Allergic rhinitis due to animal (cat) (dog) hair and dander: Secondary | ICD-10-CM | POA: Diagnosis not present

## 2021-10-29 DIAGNOSIS — H2512 Age-related nuclear cataract, left eye: Secondary | ICD-10-CM | POA: Diagnosis not present

## 2021-10-30 ENCOUNTER — Other Ambulatory Visit: Payer: Self-pay

## 2021-10-30 ENCOUNTER — Ambulatory Visit: Payer: Medicare Other | Admitting: Student-PharmD

## 2021-10-30 DIAGNOSIS — I10 Essential (primary) hypertension: Secondary | ICD-10-CM

## 2021-10-30 DIAGNOSIS — E039 Hypothyroidism, unspecified: Secondary | ICD-10-CM

## 2021-10-30 NOTE — Progress Notes (Signed)
Follow Up Pharmacist Visit   Claudia Martin, Claudia Martin  [id number] 89 years, Female  DOB: January 18, 1946  M: [phone]  Clinical Summary Situation: Patient presents via telephone for CCM F/U Visit. Background: Patient endorses adherence to medications and has no current health concerns at this time. Assessment: BP and thyroid levels are well controlled. Recommendations: -Continue current medication therapy -F/U in 5 months  Patient Chart Prep  Completed by Charlann Lange on 10/28/2021  Chronic Conditions Patient's Chronic Conditions: Hypertension (HTN), Hypothyroidism, OSA, CVD  Doctor and Hospital Visits Were there PCP Visits since last visit with the Pharmacist?: Yes Visit #1: 09/12/2021 Jonetta Osgood, NP. For hypertension. No medication changes. Visit #2: 10/10/21 Mylinda Latina, PA-C. For obstructive sleep apnea. No medication changes. Were there Specialist Visits since last visit with the Pharmacist?: Yes Visit #1: 09/26/21 Cardiology  Lanetta Inch, NP For Coronary artery disease. No medication changes. Was there a Hospital Visit in last 30 days?: No Were there other Hospital Visits since last visit with the Pharmacist?: No  Medication Information Have there been any medication changes from PCP or Specialist since last visit with the Pharmacist?: No Are there any Medication adherence gaps (beyond 5 days past due)?: No Medication adherence rates for the STAR rating drugs: Atorvastatin 80 mg 09/25/21 90 DS Losartan 25 mg 09/25/21 90 DS List Patient's current Care Gaps: No current Care Gaps identified  Pre-Call Questions  Are you able to connect with Patient?: Yes Visit Type: Phone Call May we confirm what is the best phone # for the pharmacist to call you?: [phone] Have you been in the hospital or emergency room recently?: No Do you have any questions or concerns that you want to make sure your pharmacist addresses with your during your appointment?:  No What, if any, problems do you have getting your medications from the pharmacy?: None Is there anything else that you would like me to pass along to your pharmacist or PCP?: Yes Details: Patient stated she has had some trouble with her breathing over the weekend and she currently only has a CPAP machine to help. Patient reminded to bring medication bottles to visit (not a list of meds) OR have medication bottles pulled out prior to appointment time: Done  Disease Assessments  Subjective Information Current BP: 125/77 Current HR: 90 taken on: 10/10/2021 Weight: 177 BMI: 37.03 Last GFR: >60 taken on: 09/02/202 Why did the patient present?: CCM Follow Up Visit Is Patient using [facility]?: No Name and location of Current pharmacy: [facility] Current Rx insurance plan: [employer] Are meds synced by current pharmacy?: Yes Are meds delivered by current pharmacy?: No - delivery available but patient prefers to not use Would patient benefit from direct intervention of clinical lead in dispensing process to optimize clinical outcomes?: Yes Are [facility] pharmacy services available where patient lives?: Yes Is patient disadvantaged to use [facility]?: Yes Does patient experience delays in picking up medications due to transportation concerns (getting to pharmacy)?: No Any additional demeanor/mood notes?: Patient is very pleasant to speak with.  Hypertension (HTN) Assess this condition today?: Yes Is patient able to obtain BP reading today?: No Goal: <130/80 mmHG Hypertension Stage: Elevated (SBP: 120-129 and DBP < 80) Is Patient checking BP at home?: Yes Patient home BP readings are ranging: 120s/70s How often does patient miss taking their blood pressure medications?: None Has patient experienced hypotension, dizziness, falls or bradycardia?: No Does Patient use RPM device?: No BP RPM device: Does patient qualify?: No We discussed:  DASH diet:  following a diet  emphasizing fruits and vegetables and low-fat dairy products along with whole grains, fish, poultry, and nuts. Reducing red meats and sugars., Targeting 150 minutes of aerobic activity per week, Reducing the amount of salt intake to 1500mg /per day., Proper Home BP Measurement Assessment:: Controlled Drug: Losartan 25mg  1 tablet daily Assessment: Appropriate, Effective, Safe, Accessible Drug: Metoprolol Tartrate 25mg  1 tablet twice daily Assessment: Appropriate, Effective, Safe, Accessible Plan to (other): Continue current medication therapy HC Follow up: 2 months Pharmacist Follow up: 5 months  Hypothyroidism Current TSH: 2.630 taken on: 11/20/2020 Current T4: 0.97 taken on: 11/20/2020 Assess this condition today?: Yes We discussed: Proper administration of levothyroxine (empty stomach, 30 min before breakfast and with no other meds / vitamins), Monitoring for signs / symptoms of hypothyroidism (weight gain, fatigue, hair loss, cold intolerance), Monitoring signs / symptoms of hyperthyroidism (weight loss, tachycardia, anxiety, increased sweating) Assessment:: Controlled Drug: Levothyroxine 51mcg 1 tablet daily Assessment: Appropriate, Effective, Safe, Accessible Plan to: Continue current medication therapy HC Follow up: 2 months Pharmacist Follow up: 5 months  Exercise, Diet and Non-Drug Coordination Needs Additional exercise counseling points. We discussed: targeting at least 151 minutes per week of moderate-intensity aerobic exercise. Additional diet counseling points. We discussed: key components of the DASH diet, aiming to consume at least 8 cups of water day Discussed Non-Drug Care Coordination Needs: Yes Does Patient have Medication financial barriers?: No  [facility] Health-Related Social Needs Screening Tool -  SDOH  What is your living situation today? (ref #1): I have a steady place to live Think about the place you live. Do you have problems with any  of the following? (ref #2): None of the above Within the past 12 months, you worried that your food would run out before you got money to buy more (ref #3): Never true Within the past 12 months, the food you bought just didn't last and you didn't have money to get more (ref #4): Never true In the past 12 months, has lack of reliable transportation kept you from medical appointments, meetings, work or from getting things needed for daily living? (ref #5): No In the past 12 months, has the electric, gas, oil, or water company threatened to shut off services in your home? (ref #6): No How often does anyone, including family and friends, physically hurt you? (ref #7): Never (1) How often does anyone, including family and friends, insult or talk down to you? (ref #8): Never (1) How often does anyone, including friends and family, threaten you with harm? (ref #9): Never (1) How often does anyone, including family and friends, scream or curse at you? (ref #10): Never (1)  Exercises to do While Sitting Silhouette of a female drinking a glass of water with ice.  Exercises that you do while sitting (chair exercises) can give you many of the same benefits as full exercise. Benefits include strengthening your heart, burning calories, and keeping muscles and joints healthy. Exercise can also improve your mood and help with depression and anxiety. You may benefit from chair exercises if you are unable to do standing exercises due to: Diabetic foot pain. Obesity. Illness. Arthritis. Recovery from surgery or injury. Breathing problems. Balance problems. Another type of disability. Before starting chair exercises, check with your health care provider or a physical therapist to find out how much exercise you can tolerate and which exercises are safe for you. If your health care provider approves: Start out slowly and build up over  time. Aim to work up to about 10-20 minutes for each exercise session. Make  exercise part of your daily routine. Drink water when you exercise. Do not wait until you are thirsty. Drink every 10-15 minutes. Stop exercising right away if you have pain, nausea, shortness of breath, or dizziness. If you are exercising in a wheelchair, make sure to lock the wheels. Ask your health care provider whether you can do tai chi or yoga. Many positions in these mind-body exercises can be modified to do while seated. Warm-up Before starting other exercises: Sit up as straight as you can. Have your knees bent at 90 degrees, which is the shape of the capital letter "L." Keep your feet flat on the floor. Sit at the front edge of your chair, if you can. Pull in (tighten) the muscles in your abdomen and stretch your spine and neck as straight as you can. Hold this position for a few minutes. Breathe in and out evenly. Try to concentrate on your breathing, and relax your mind. Stretching Exercise A: Arm stretch Hold your arms out straight in front of your body. Bend your hands at the wrist with your fingers pointing up, as if signaling someone to stop. Notice the slight tension in your forearms as you hold the position. Keeping your arms out and your hands bent, rotate your hands outward as far as you can and hold this stretch. Aim to have your thumbs pointing up and your pinkie fingers pointing down. Slowly repeat arm stretches for one minute as tolerated. Exercise B: Leg stretch If you can move your legs, try to "draw" letters on the floor with the toes of your foot. Write your name with one foot. Write your name with the toes of your other foot. Slowly repeat the movements for one minute as tolerated. Exercise C: Reach for the sky Reach your hands as far over your head as you can to stretch your spine. Move your hands and arms as if you are climbing a rope. Slowly repeat the movements for one minute as tolerated. Range of motion exercises Exercise A: Shoulder roll Let your arms  hang loosely at your sides. Lift just your shoulders up toward your ears, then let them relax back down. When your shoulders feel loose, rotate your shoulders in backward and forward circles. Do shoulder rolls slowly for one minute as tolerated. Exercise B: March in place As if you are marching, pump your arms and lift your legs up and down. Lift your knees as high as you can. If you are unable to lift your knees, just pump your arms and move your ankles and feet up and down. March in place for one minute as tolerated. Exercise C: Seated jumping jacks Let your arms hang down straight. Keeping your arms straight, lift them up over your head. Aim to point your fingers to the ceiling. While you lift your arms, straighten your legs and slide your heels along the floor to your sides, as wide as you can. As you bring your arms back down to your sides, slide your legs back together. If you are unable to use your legs, just move your arms. Slowly repeat seated jumping jacks for one minute as tolerated. Strengthening exercises Exercise A: Shoulder squeeze Hold your arms straight out from your body to your sides, with your elbows bent and your fists pointed at the ceiling. Keeping your arms in the bent position, move them forward so your elbows and forearms meet in front of  your face. Open your arms back out as wide as you can with your elbows still bent, until you feel your shoulder blades squeezing together. Hold for 5 seconds. Slowly repeat the movements forward and backward for one minute as tolerated. Contact a health care provider if: You have to stop exercising due to any of the following: Pain. Nausea. Shortness of breath. Dizziness. Fatigue. You have significant pain or soreness after exercising. Get help right away if: You have chest pain. You have difficulty breathing. These symptoms may represent a serious problem that is an emergency. Do not wait to see if the symptoms will go  away. Get medical help right away. Call your local emergency services (911 in the [place] Do not drive yourself to the hospital. Summary Exercises that you do while sitting (chair exercises) can strengthen your heart, burn calories, and keep muscles and joints healthy. You may benefit from chair exercises if you are unable to do standing exercises due to diabetic foot pain, obesity, recovery from surgery or injury, or other conditions. Before starting chair exercises, check with your health care provider or a physical therapist to find out how much exercise you can tolerate and which exercises are safe for you. This information is not intended to replace advice given to you by your health care provider. Make sure you discuss any questions you have with your health care provider.      Alena Bills Clinical Pharmacist [phone]

## 2021-11-01 DIAGNOSIS — J3081 Allergic rhinitis due to animal (cat) (dog) hair and dander: Secondary | ICD-10-CM | POA: Diagnosis not present

## 2021-11-01 DIAGNOSIS — J301 Allergic rhinitis due to pollen: Secondary | ICD-10-CM | POA: Diagnosis not present

## 2021-11-01 DIAGNOSIS — J3089 Other allergic rhinitis: Secondary | ICD-10-CM | POA: Diagnosis not present

## 2021-11-03 DIAGNOSIS — I10 Essential (primary) hypertension: Secondary | ICD-10-CM | POA: Diagnosis not present

## 2021-11-03 DIAGNOSIS — E039 Hypothyroidism, unspecified: Secondary | ICD-10-CM | POA: Diagnosis not present

## 2021-11-06 DIAGNOSIS — G4733 Obstructive sleep apnea (adult) (pediatric): Secondary | ICD-10-CM | POA: Diagnosis not present

## 2021-11-08 ENCOUNTER — Telehealth: Payer: Medicare Other

## 2021-11-08 DIAGNOSIS — J3089 Other allergic rhinitis: Secondary | ICD-10-CM | POA: Diagnosis not present

## 2021-11-08 DIAGNOSIS — J3081 Allergic rhinitis due to animal (cat) (dog) hair and dander: Secondary | ICD-10-CM | POA: Diagnosis not present

## 2021-11-08 DIAGNOSIS — J301 Allergic rhinitis due to pollen: Secondary | ICD-10-CM | POA: Diagnosis not present

## 2021-11-12 DIAGNOSIS — J3089 Other allergic rhinitis: Secondary | ICD-10-CM | POA: Diagnosis not present

## 2021-11-12 DIAGNOSIS — J3081 Allergic rhinitis due to animal (cat) (dog) hair and dander: Secondary | ICD-10-CM | POA: Diagnosis not present

## 2021-11-12 DIAGNOSIS — J301 Allergic rhinitis due to pollen: Secondary | ICD-10-CM | POA: Diagnosis not present

## 2021-11-13 DIAGNOSIS — J301 Allergic rhinitis due to pollen: Secondary | ICD-10-CM | POA: Diagnosis not present

## 2021-11-19 ENCOUNTER — Telehealth: Payer: Self-pay | Admitting: Student-PharmD

## 2021-11-19 DIAGNOSIS — J3081 Allergic rhinitis due to animal (cat) (dog) hair and dander: Secondary | ICD-10-CM | POA: Diagnosis not present

## 2021-11-19 DIAGNOSIS — J301 Allergic rhinitis due to pollen: Secondary | ICD-10-CM | POA: Diagnosis not present

## 2021-11-19 DIAGNOSIS — J3089 Other allergic rhinitis: Secondary | ICD-10-CM | POA: Diagnosis not present

## 2021-11-19 NOTE — Progress Notes (Signed)
°  Chronic Care Management Pharmacy Assistant   Name: Claudia Martin  MRN: [id number] DOB: 1946-02-16  Reason for Encounter: Mailing Care Plan  Medications: Outpatient Encounter Medications as of 11/19/2021  Medication Sig Note   aspirin 81 MG chewable tablet Chew 1 tablet (81 mg total) by mouth daily.    atorvastatin (LIPITOR) 80 MG tablet Take 1 tablet (80 mg total) by mouth daily.    cetirizine (ZYRTEC) 10 MG tablet Take 10 mg by mouth daily.    chlorhexidine (PERIDEX) 0.12 % solution Use as directed 5 mLs in the mouth or throat 2 (two) times daily.    conjugated estrogens (PREMARIN) vaginal cream Use 1 applicatorful twice weekly. (Patient taking differently: Place 1 Applicatorful vaginally 2 (two) times a week. Use just a little bit, not an applicatorful.)    EPINEPHrine 0.3 mg/0.3 mL IJ SOAJ injection as needed. 07/16/2015: Received from: [facility] 0.3 % ophthalmic suspension Place 1 drop into the right eye daily.    levothyroxine (SYNTHROID) 50 MCG tablet TAKE 1 TABLET(50 MCG) BY MOUTH DAILY BEFORE AND BREAKFAST    losartan (COZAAR) 25 MG tablet Take 1 tablet (25 mg total) by mouth daily.    metoprolol tartrate (LOPRESSOR) 25 MG tablet Take 1 tablet (25 mg total) by mouth 2 (two) times daily.    moxifloxacin (VIGAMOX) 0.5 % ophthalmic solution Place 1 drop into the right eye 3 (three) times daily.    nitroGLYCERIN (NITROSTAT) 0.4 MG SL tablet Place 1 tablet (0.4 mg total) under the tongue every 5 (five) minutes x 3 doses as needed for chest pain.    prednisoLONE acetate (PRED FORTE) 1 % ophthalmic suspension Place 1 drop into the right eye 4 (four) times daily.    valACYclovir (VALTREX) 1000 MG tablet TAKE 1 TABLET(1000 MG) BY MOUTH DAILY. CAN TAKE 1 TABLET TWICE DAILY IF FLARE UP FOR 7 DAYS    Zoster Vaccine Adjuvanted [facility]) injection Inject 0.5 mLs into the muscle once.    No facility-administered encounter medications on file as of 11/19/2021.     Reviewed the patients visit reinsured it was completed per the pharmacist Alena Bills request. Printed the CCM care plan. Mailed the patient CCM care plan to their most recent address on file.  Charlann Lange, [place]  [phone]

## 2021-11-20 ENCOUNTER — Ambulatory Visit (INDEPENDENT_AMBULATORY_CARE_PROVIDER_SITE_OTHER): Payer: Medicare Other

## 2021-11-20 ENCOUNTER — Other Ambulatory Visit: Payer: Self-pay

## 2021-11-20 DIAGNOSIS — G4733 Obstructive sleep apnea (adult) (pediatric): Secondary | ICD-10-CM | POA: Diagnosis not present

## 2021-11-20 NOTE — Progress Notes (Signed)
95 percentile pressure 8.2   95th percentile leak 0.0   apnea index 3.8 /hr  apnea-hypopnea index  6.5 /hr   total days used  >4 hr 90 days  total days used <4 hr 0 days  Total compliance 100 percent  Pt doing great on new pressures of 5-10, no other problems or questions at this time.  Pt was seen by Claiborne Billings  RRT/RCP  from [facility]

## 2021-11-28 DIAGNOSIS — J301 Allergic rhinitis due to pollen: Secondary | ICD-10-CM | POA: Diagnosis not present

## 2021-11-28 DIAGNOSIS — J3089 Other allergic rhinitis: Secondary | ICD-10-CM | POA: Diagnosis not present

## 2021-11-28 DIAGNOSIS — J3081 Allergic rhinitis due to animal (cat) (dog) hair and dander: Secondary | ICD-10-CM | POA: Diagnosis not present

## 2021-12-02 ENCOUNTER — Encounter: Payer: Self-pay | Admitting: Nurse Practitioner

## 2021-12-02 ENCOUNTER — Other Ambulatory Visit: Payer: Self-pay

## 2021-12-02 ENCOUNTER — Ambulatory Visit (INDEPENDENT_AMBULATORY_CARE_PROVIDER_SITE_OTHER): Payer: Medicare Other | Admitting: Nurse Practitioner

## 2021-12-02 VITALS — BP 140/75 | HR 93 | Temp 98.8°F | Resp 16 | Ht <= 58 in | Wt 176.2 lb

## 2021-12-02 DIAGNOSIS — I10 Essential (primary) hypertension: Secondary | ICD-10-CM | POA: Diagnosis not present

## 2021-12-02 DIAGNOSIS — B009 Herpesviral infection, unspecified: Secondary | ICD-10-CM

## 2021-12-02 DIAGNOSIS — J301 Allergic rhinitis due to pollen: Secondary | ICD-10-CM | POA: Insufficient documentation

## 2021-12-02 DIAGNOSIS — E559 Vitamin D deficiency, unspecified: Secondary | ICD-10-CM | POA: Diagnosis not present

## 2021-12-02 DIAGNOSIS — Z780 Asymptomatic menopausal state: Secondary | ICD-10-CM

## 2021-12-02 DIAGNOSIS — E039 Hypothyroidism, unspecified: Secondary | ICD-10-CM | POA: Diagnosis not present

## 2021-12-02 DIAGNOSIS — R300 Dysuria: Secondary | ICD-10-CM | POA: Diagnosis not present

## 2021-12-02 DIAGNOSIS — J3081 Allergic rhinitis due to animal (cat) (dog) hair and dander: Secondary | ICD-10-CM | POA: Insufficient documentation

## 2021-12-02 DIAGNOSIS — H1045 Other chronic allergic conjunctivitis: Secondary | ICD-10-CM | POA: Insufficient documentation

## 2021-12-02 DIAGNOSIS — Z0001 Encounter for general adult medical examination with abnormal findings: Secondary | ICD-10-CM | POA: Diagnosis not present

## 2021-12-02 DIAGNOSIS — R7303 Prediabetes: Secondary | ICD-10-CM | POA: Diagnosis not present

## 2021-12-02 MED ORDER — FAMCICLOVIR 500 MG PO TABS: 500.0000 mg | ORAL_TABLET | Freq: Two times a day (BID) | ORAL | 0 refills | Status: DC

## 2021-12-02 MED ORDER — FLUCONAZOLE 150 MG PO TABS: 150.0000 mg | ORAL_TABLET | Freq: Once | ORAL | 0 refills | Status: AC

## 2021-12-02 NOTE — Progress Notes (Signed)
[facility] [place], [place] [address]  Internal MEDICINE  Office Visit Note  Patient Name: Claudia Martin  [id number]  [id number]  Date of Service: 12/02/2021  Chief Complaint  Patient presents with   Medicare Wellness    Lips have blisters, tongue feels like it has abrasions    Gastroesophageal Reflux   Hypertension    HPI Claudia Martin presents for an annual well visit and physical exam.  She is a well-appearing 76 year old female with hypertension, recurrent herpes simplex infection, hypothyroidism and history of a STEMI in September 2022.  Her blood pressure is currently stable on her medications.  She is currently having a herpes flareup with blisters on her lips and lesions under her tongue and is painful.  She has tried taking valacyclovir for an entire week and it has not helped.  Patient would like to try something else. She is scheduled for cataract surgery on Wednesday this week with Dr. Wallace Going.  She is due for her mammogram in October or later this year.  She had a routine colonoscopy last year and only 1 polyp was found, she is due for repeat colonoscopy in 2027.  She had many labs drawn in September, she is due to have some of her labs repeated now. She is due for BMD screening needs DEXA scan ordered. She wants to get her shingles vaccine but wants to wait until after her cataract surgery. She is taking Brilinta for anticoagulation and prevention of subsequent MI. She denies any pain today and has no other questions or concerns.   Current Medication: Outpatient Encounter Medications as of 12/02/2021  Medication Sig Note   aspirin 81 MG chewable tablet Chew 1 tablet (81 mg total) by mouth daily.    cetirizine (ZYRTEC) 10 MG tablet Take 10 mg by mouth daily.    chlorhexidine (PERIDEX) 0.12 % solution Use as directed 5 mLs in the mouth or throat 2 (two) times daily.    conjugated estrogens (PREMARIN) vaginal cream Use 1 applicatorful  twice weekly. (Patient taking differently: Place 1 Applicatorful vaginally 2 (two) times a week. Use just a little bit, not an applicatorful.)    EPINEPHrine 0.3 mg/0.3 mL IJ SOAJ injection as needed. 07/16/2015: Received from: [facility]   famciclovir (FAMVIR) 500 MG tablet Take 1 tablet (500 mg total) by mouth 2 (two) times daily. During outbreak/flare-up until resolved.    fluconazole (DIFLUCAN) 150 MG tablet Take 1 tablet (150 mg total) by mouth once for 1 dose.    ILEVRO 0.3 % ophthalmic suspension Place 1 drop into the right eye daily.    levothyroxine (SYNTHROID) 50 MCG tablet TAKE 1 TABLET(50 MCG) BY MOUTH DAILY BEFORE AND BREAKFAST    losartan (COZAAR) 25 MG tablet Take 1 tablet by mouth daily.    moxifloxacin (VIGAMOX) 0.5 % ophthalmic solution Place 1 drop into the right eye 3 (three) times daily.    prednisoLONE acetate (PRED FORTE) 1 % ophthalmic suspension Place 1 drop into the right eye 4 (four) times daily.    Zoster Vaccine Adjuvanted [facility]) injection Inject 0.5 mLs into the muscle once.    [DISCONTINUED] valACYclovir (VALTREX) 1000 MG tablet TAKE 1 TABLET(1000 MG) BY MOUTH DAILY. CAN TAKE 1 TABLET TWICE DAILY IF FLARE UP FOR 7 DAYS    amLODipine (NORVASC) 2.5 MG tablet Take 2.5 mg by mouth 2 (two) times daily.    BRILINTA 90 MG TABS tablet Take 90 mg by mouth 2 (two) times daily.    [  EXPIRED] fluconazole (DIFLUCAN) 150 MG tablet Take 1 tablet (150 mg total) by mouth once for 1 dose. If still symptomatic, may take a second dose in 3 days.    rosuvastatin (CRESTOR) 5 MG tablet Take 5 mg by mouth daily.    [DISCONTINUED] amLODipine (NORVASC) 2.5 MG tablet Take 2 tablets (5 mg total) by mouth daily.    [DISCONTINUED] atenolol (TENORMIN) 25 MG tablet TAKE 1 AND 1/2 TABLETS BY MOUTH TWICE DAILY (Patient taking differently: Take 37.5 mg by mouth 2 (two) times daily.)    [DISCONTINUED] Blood Glucose Monitoring Suppl (ACCU-CHEK AVIVA PLUS) w/Device  KIT Use as directed    [DISCONTINUED] chlorhexidine (PERIDEX) 0.12 % solution Use as directed 5 mLs in the mouth or throat 2 (two) times daily.    [DISCONTINUED] conjugated estrogens (PREMARIN) vaginal cream Use 1 applicatorful twice weekly.    [DISCONTINUED] ergocalciferol (DRISDOL) 1.25 MG (50000 UT) capsule Take 1 capsule (50,000 Units total) by mouth once a week. (Patient not taking: Reported on 05/31/2021)    [DISCONTINUED] levothyroxine (SYNTHROID) 50 MCG tablet Take 1 tablet (50 mcg total) by mouth daily before breakfast.    [DISCONTINUED] nystatin (MYCOSTATIN/NYSTOP) powder Apply 1 application topically 3 (three) times daily. (Patient not taking: Reported on 04/11/2021)    [DISCONTINUED] olopatadine (PATANOL) 0.1 % ophthalmic solution instill 1 drop into both eyes twice a day for ITCHY EYES (Patient not taking: Reported on 04/11/2021) 07/16/2015: Received from: External Pharmacy   [DISCONTINUED] Olopatadine HCl 0.2 % SOLN Place 1 drop into both eyes 2 (two) times daily. (Patient not taking: Reported on 04/11/2021)    [DISCONTINUED] rosuvastatin (CRESTOR) 5 MG tablet Take 1 tablet (5 mg total) by mouth daily.    [DISCONTINUED] valACYclovir (VALTREX) 1000 MG tablet Take 1 tablet (1,000 mg total) by mouth daily. Pt can 1 tablet twice a day if flare up for 7 days.    No facility-administered encounter medications on file as of 12/02/2021.    Surgical History: Past Surgical History:  Procedure Laterality Date   BREAST BIOPSY     CATARACT EXTRACTION W/PHACO Right 06/05/2021   Procedure: CATARACT EXTRACTION PHACO AND INTRAOCULAR LENS PLACEMENT ([place]) RIGHT;  Surgeon: Leandrew Koyanagi, MD;  Location: [facility];  Service: Ophthalmology;  Laterality: Right;  3.02 00:39.5   COLON SURGERY     COLONOSCOPY WITH PROPOFOL N/A 03/15/2018   Procedure: COLONOSCOPY WITH PROPOFOL;  Surgeon: Lin Landsman, MD;  Location: [facility] ENDOSCOPY;  Service: Gastroenterology;  Laterality: N/A;    COLONOSCOPY WITH PROPOFOL N/A 01/09/2021   Procedure: COLONOSCOPY WITH PROPOFOL;  Surgeon: Lin Landsman, MD;  Location: [facility] ENDOSCOPY;  Service: Gastroenterology;  Laterality: N/A;   CORONARY/GRAFT ACUTE MI REVASCULARIZATION N/A 06/06/2021   Procedure: Coronary/Graft Acute MI Revascularization;  Surgeon: Yolonda Kida, MD;  Location: [facility];  Service: Cardiovascular;  Laterality: N/A;   diverticulitis     ECTOPIC PREGNANCY SURGERY     fx thumb     HERNIA REPAIR     LEFT HEART CATH AND CORONARY ANGIOGRAPHY N/A 06/06/2021   Procedure: LEFT HEART CATH AND CORONARY ANGIOGRAPHY;  Surgeon: Yolonda Kida, MD;  Location: [facility];  Service: Cardiovascular;  Laterality: N/A;    Medical History: Past Medical History:  Diagnosis Date   Abdominal pain    Allergic rhinitis    Arthritis    Candidiasis of vulva and vagina    Cellulitis and abscess    Diverticulosis    Dysuria    Enlarged heart  Eyelid inflammation    GERD (gastroesophageal reflux disease)    Hematuria syndrome    Hemorrhoids    Herpes genitalis    Herpes simplex    HTN (hypertension)    Hydronephrosis    Hypothyroidism    Incomplete bladder emptying    Internal hordeolum    Knee pain    Otalgia    Primary ovarian failure    Sciatica    Shortness of breath    Sinus bradycardia    Sleep apnea    uses CPAP   Urethral diverticulum    UTI (lower urinary tract infection)    Vaginal atrophy    Vaginitis     Family History: Family History  Problem Relation Age of Onset   Tuberculosis Mother    Nephrolithiasis Brother    Vision loss Daughter    Hypertension Daughter     Social History   Socioeconomic History   Marital status: Divorced    Spouse name: Not on file   Number of children: Not on file   Years of education: Not on file   Highest education level: Not on file  Occupational History   Not on file  Tobacco Use    Smoking status: Never   Smokeless tobacco: Never  Vaping Use   Vaping Use: Never used  Substance and Sexual Activity   Alcohol use: No   Drug use: Never   Sexual activity: Not on file  Other Topics Concern   Not on file  Social History Narrative   Not on file   Social Determinants of Health   Financial Resource Strain: Not on file  Food Insecurity: Not on file  Transportation Needs: Not on file  Physical Activity: Not on file  Stress: Not on file  Social Connections: Not on file  Intimate Partner Violence: Not on file      Review of Systems  Constitutional:  Negative for activity change, appetite change, chills, fatigue, fever and unexpected weight change.  HENT: Negative.  Negative for congestion, ear pain, rhinorrhea, sore throat and trouble swallowing.   Eyes: Negative.   Respiratory: Negative.  Negative for cough, chest tightness, shortness of breath and wheezing.   Cardiovascular: Negative.  Negative for chest pain.  Gastrointestinal: Negative.  Negative for abdominal pain, blood in stool, constipation, diarrhea, nausea and vomiting.  Endocrine: Negative.   Genitourinary: Negative.  Negative for difficulty urinating, dysuria, frequency, hematuria and urgency.  Musculoskeletal: Negative.  Negative for arthralgias, back pain, joint swelling, myalgias and neck pain.  Skin: Negative.  Negative for rash and wound.  Allergic/Immunologic: Negative.  Negative for immunocompromised state.  Neurological: Negative.  Negative for dizziness, seizures, numbness and headaches.  Hematological: Negative.   Psychiatric/Behavioral: Negative.  Negative for behavioral problems, self-injury and suicidal ideas. The patient is not nervous/anxious.    Vital Signs: BP 140/75    Pulse 93    Temp 98.8 F (37.1 C)    Resp 16    Ht 4' 7" (1.397 m)    Wt 176 lb 3.2 oz (79.9 kg)    SpO2 98%    BMI 40.95 kg/m    Physical Exam Vitals reviewed.  Constitutional:      General: She is not in  acute distress.    Appearance: Normal appearance. She is well-developed. She is obese. She is not ill-appearing or diaphoretic.  HENT:     Head: Normocephalic and atraumatic.     Right Ear: Tympanic membrane, ear canal and external ear normal.  Left Ear: Tympanic membrane, ear canal and external ear normal.     Nose: Nose normal. No congestion or rhinorrhea.     Mouth/Throat:     Mouth: Mucous membranes are moist.     Pharynx: Oropharynx is clear. No oropharyngeal exudate or posterior oropharyngeal erythema.  Eyes:     General: No scleral icterus.       Right eye: No discharge.        Left eye: No discharge.     Extraocular Movements: Extraocular movements intact.     Conjunctiva/sclera: Conjunctivae normal.     Pupils: Pupils are equal, round, and reactive to light.  Neck:     Thyroid: No thyromegaly.     Vascular: No JVD.     Trachea: No tracheal deviation.  Cardiovascular:     Rate and Rhythm: Normal rate and regular rhythm.     Pulses: Normal pulses.     Heart sounds: Normal heart sounds. No murmur heard.   No friction rub. No gallop.  Pulmonary:     Effort: Pulmonary effort is normal. No respiratory distress.     Breath sounds: Normal breath sounds. No stridor. No wheezing or rales.  Chest:     Chest wall: No tenderness.  Abdominal:     General: Bowel sounds are normal. There is no distension.     Palpations: Abdomen is soft. There is no mass.     Tenderness: There is no abdominal tenderness. There is no guarding or rebound.  Musculoskeletal:        General: No tenderness or deformity. Normal range of motion.     Cervical back: Normal range of motion and neck supple.  Lymphadenopathy:     Cervical: No cervical adenopathy.  Skin:    General: Skin is warm and dry.     Capillary Refill: Capillary refill takes less than 2 seconds.     Coloration: Skin is not pale.     Findings: No erythema or rash.  Neurological:     Mental Status: She is alert and oriented to  person, place, and time.     Cranial Nerves: No cranial nerve deficit.     Motor: No abnormal muscle tone.     Coordination: Coordination normal.     Gait: Gait normal.     Deep Tendon Reflexes: Reflexes are normal and symmetric.  Psychiatric:        Mood and Affect: Mood normal.        Behavior: Behavior normal.        Thought Content: Thought content normal.        Judgment: Judgment normal.       Assessment/Plan: 1. Encounter for routine adult health examination with abnormal findings Age-appropriate preventive screenings and vaccinations discussed, annual physical exam completed. Routine labs for health maintenance ordered, see below. PHM updated.   2. Prediabetes Routine labs ordered - CMP14+EGFR - Hemoglobin A1c  3. Herpes simplex infection Stop valacyclovir, patient to try famciclovir for flareups.  Some of the lesions under her tongue looked like they may be fungal in origin, so a dose of fluconazole was also prescribed. - famciclovir (FAMVIR) 500 MG tablet; Take 1 tablet (500 mg total) by mouth 2 (two) times daily. During outbreak/flare-up until resolved.  Dispense: 60 tablet; Refill: 0  4. Essential hypertension Stable continue current medications as prescribed  5. Acquired hypothyroidism Patient is currently taking levothyroxine 50 mcg daily.  Thyroid levels have not been checked for 12 months.  Routine lab ordered -  TSH + free T4  6. Asymptomatic postmenopausal state Patient is due for DEXA scan, imaging ordered to go to [facility] imaging. - DG Bone Density; Future  7. Vitamin D deficiency Routine lab ordered - Vitamin D (25 hydroxy)  8. Dysuria Routine urinalysis done - UA/M w/rflx Culture, Routine       General Counseling: Rosemarie Ax understanding of the findings of todays visit and agrees with plan of treatment. I have discussed any further diagnostic evaluation that may be needed or ordered today. We also reviewed her medications today.  she has been encouraged to call the office with any questions or concerns that should arise related to todays visit.    Orders Placed This Encounter  Procedures   DG Bone Density   UA/M w/rflx Culture, Routine   CMP14+EGFR   Hemoglobin A1c   Vitamin D (25 hydroxy)   TSH + free T4    Meds ordered this encounter  Medications   fluconazole (DIFLUCAN) 150 MG tablet    Sig: Take 1 tablet (150 mg total) by mouth once for 1 dose.    Dispense:  1 tablet    Refill:  0   famciclovir (FAMVIR) 500 MG tablet    Sig: Take 1 tablet (500 mg total) by mouth 2 (two) times daily. During outbreak/flare-up until resolved.    Dispense:  60 tablet    Refill:  0    Return in about 3 months (around 03/01/2022) for F/U, Camara Renstrom PCP.   Total time spent:30 Minutes Time spent includes review of chart, medications, test results, and follow up plan with the patient.   [place] Controlled Substance Database was reviewed by me.  This patient was seen by Jonetta Osgood, FNP-C in collaboration with Dr. Clayborn Bigness as a part of collaborative care agreement.  Hutson Luft R. Valetta Fuller, MSN, FNP-C Internal medicine

## 2021-12-02 NOTE — Discharge Instructions (Signed)

## 2021-12-03 ENCOUNTER — Other Ambulatory Visit: Payer: Self-pay | Admitting: Nurse Practitioner

## 2021-12-03 DIAGNOSIS — E559 Vitamin D deficiency, unspecified: Secondary | ICD-10-CM | POA: Diagnosis not present

## 2021-12-03 DIAGNOSIS — R7303 Prediabetes: Secondary | ICD-10-CM | POA: Diagnosis not present

## 2021-12-03 DIAGNOSIS — E039 Hypothyroidism, unspecified: Secondary | ICD-10-CM | POA: Diagnosis not present

## 2021-12-04 ENCOUNTER — Other Ambulatory Visit: Payer: Self-pay

## 2021-12-04 ENCOUNTER — Ambulatory Visit
Admission: RE | Admit: 2021-12-04 | Discharge: 2021-12-04 | Disposition: A | Discharge status: Home / Self Care | Payer: Medicare Other | Attending: Ophthalmology | Admitting: Ophthalmology

## 2021-12-04 ENCOUNTER — Encounter: Payer: Self-pay | Admitting: Ophthalmology

## 2021-12-04 ENCOUNTER — Ambulatory Visit: Payer: Medicare Other | Admitting: Anesthesiology

## 2021-12-04 ENCOUNTER — Encounter
Admission: RE | Disposition: A | Discharge status: Home / Self Care | Payer: Self-pay | Source: Home / Self Care | Attending: Ophthalmology

## 2021-12-04 DIAGNOSIS — G4730 Sleep apnea, unspecified: Secondary | ICD-10-CM | POA: Insufficient documentation

## 2021-12-04 DIAGNOSIS — G4733 Obstructive sleep apnea (adult) (pediatric): Secondary | ICD-10-CM | POA: Diagnosis not present

## 2021-12-04 DIAGNOSIS — H2512 Age-related nuclear cataract, left eye: Secondary | ICD-10-CM | POA: Diagnosis not present

## 2021-12-04 DIAGNOSIS — E039 Hypothyroidism, unspecified: Secondary | ICD-10-CM | POA: Diagnosis not present

## 2021-12-04 DIAGNOSIS — Z6841 Body Mass Index (BMI) 40.0 and over, adult: Secondary | ICD-10-CM | POA: Insufficient documentation

## 2021-12-04 DIAGNOSIS — K219 Gastro-esophageal reflux disease without esophagitis: Secondary | ICD-10-CM | POA: Insufficient documentation

## 2021-12-04 DIAGNOSIS — Z955 Presence of coronary angioplasty implant and graft: Secondary | ICD-10-CM | POA: Insufficient documentation

## 2021-12-04 DIAGNOSIS — I252 Old myocardial infarction: Secondary | ICD-10-CM | POA: Insufficient documentation

## 2021-12-04 DIAGNOSIS — M1990 Unspecified osteoarthritis, unspecified site: Secondary | ICD-10-CM | POA: Insufficient documentation

## 2021-12-04 DIAGNOSIS — I10 Essential (primary) hypertension: Secondary | ICD-10-CM | POA: Diagnosis not present

## 2021-12-04 DIAGNOSIS — I2510 Atherosclerotic heart disease of native coronary artery without angina pectoris: Secondary | ICD-10-CM | POA: Diagnosis not present

## 2021-12-04 DIAGNOSIS — M5430 Sciatica, unspecified side: Secondary | ICD-10-CM | POA: Diagnosis not present

## 2021-12-04 DIAGNOSIS — H25812 Combined forms of age-related cataract, left eye: Secondary | ICD-10-CM | POA: Diagnosis not present

## 2021-12-04 DIAGNOSIS — E669 Obesity, unspecified: Secondary | ICD-10-CM | POA: Diagnosis not present

## 2021-12-04 DIAGNOSIS — K5790 Diverticulosis of intestine, part unspecified, without perforation or abscess without bleeding: Secondary | ICD-10-CM | POA: Insufficient documentation

## 2021-12-04 HISTORY — PX: CATARACT EXTRACTION W/PHACO: SHX586

## 2021-12-04 LAB — CMP14+EGFR
ALT: 14 IU/L (ref 0–32)
AST: 20 IU/L (ref 0–40)
Albumin/Globulin Ratio: 1.3 (ref 1.2–2.2)
Albumin: 4.2 g/dL (ref 3.7–4.7)
Alkaline Phosphatase: 157 IU/L — ABNORMAL HIGH (ref 44–121)
BUN/Creatinine Ratio: 22 (ref 12–28)
BUN: 21 mg/dL (ref 8–27)
Bilirubin Total: 0.6 mg/dL (ref 0.0–1.2)
CO2: 21 mmol/L (ref 20–29)
Calcium: 9.3 mg/dL (ref 8.7–10.3)
Chloride: 101 mmol/L (ref 96–106)
Creatinine, Ser: 0.96 mg/dL (ref 0.57–1.00)
Globulin, Total: 3.3 g/dL (ref 1.5–4.5)
Glucose: 98 mg/dL (ref 70–99)
Potassium: 4.6 mmol/L (ref 3.5–5.2)
Sodium: 138 mmol/L (ref 134–144)
Total Protein: 7.5 g/dL (ref 6.0–8.5)
eGFR: 62 mL/min/{1.73_m2} (ref >59)

## 2021-12-04 LAB — VITAMIN D 25 HYDROXY (VIT D DEFICIENCY, FRACTURES): Vit D, 25-Hydroxy: 42.7 ng/mL (ref 30.0–100.0)

## 2021-12-04 LAB — TSH+FREE T4
Free T4: 1.21 ng/dL (ref 0.82–1.77)
TSH: 1.66 u[IU]/mL (ref 0.450–4.500)

## 2021-12-04 LAB — HEMOGLOBIN A1C
Est. average glucose Bld gHb Est-mCnc: 120 mg/dL
Hgb A1c MFr Bld: 5.8 % — ABNORMAL HIGH (ref 4.8–5.6)

## 2021-12-04 SURGERY — PHACOEMULSIFICATION, CATARACT, WITH IOL INSERTION
Anesthesia: Monitor Anesthesia Care | Site: Eye | Laterality: Left

## 2021-12-04 MED ORDER — SIGHTPATH DOSE#1 BSS IO SOLN
INTRAOCULAR | Status: DC | PRN
  Administered 2021-12-04: 1 mL via INTRAMUSCULAR

## 2021-12-04 MED ORDER — SIGHTPATH DOSE#1 BSS IO SOLN
INTRAOCULAR | Status: DC | PRN
  Administered 2021-12-04: 15 mL

## 2021-12-04 MED ORDER — TETRACAINE HCL 0.5 % OP SOLN
1.0000 [drp] | OPHTHALMIC | Status: DC | PRN
Start: 2021-12-04 — End: 2021-12-04
  Administered 2021-12-04 (×3): 1 [drp] via OPHTHALMIC

## 2021-12-04 MED ORDER — MIDAZOLAM HCL 2 MG/2ML IJ SOLN
INTRAMUSCULAR | Status: DC | PRN
Start: 2021-12-04 — End: 2021-12-04
  Administered 2021-12-04: 1 mg via INTRAVENOUS

## 2021-12-04 MED ORDER — ACETAMINOPHEN 10 MG/ML IV SOLN: 1000.0000 mg | Freq: Once | INTRAVENOUS | Status: DC | PRN

## 2021-12-04 MED ORDER — ONDANSETRON HCL 4 MG/2ML IJ SOLN: 4.0000 mg | Freq: Once | INTRAMUSCULAR | Status: DC | PRN

## 2021-12-04 MED ORDER — FENTANYL CITRATE (PF) 100 MCG/2ML IJ SOLN
INTRAMUSCULAR | Status: DC | PRN
  Administered 2021-12-04: 50 ug via INTRAVENOUS

## 2021-12-04 MED ORDER — LACTATED RINGERS IV SOLN: INTRAVENOUS | Status: DC

## 2021-12-04 MED ORDER — BRIMONIDINE TARTRATE-TIMOLOL 0.2-0.5 % OP SOLN
OPHTHALMIC | Status: DC | PRN
  Administered 2021-12-04: 1 [drp] via OPHTHALMIC

## 2021-12-04 MED ORDER — ARMC OPHTHALMIC DILATING DROPS
1.0000 "application " | OPHTHALMIC | Status: DC | PRN
  Administered 2021-12-04 (×3): 1 via OPHTHALMIC

## 2021-12-04 MED ORDER — SIGHTPATH DOSE#1 BSS IO SOLN
INTRAOCULAR | Status: DC | PRN
  Administered 2021-12-04: 71 mL via OPHTHALMIC

## 2021-12-04 MED ORDER — MOXIFLOXACIN HCL 0.5 % OP SOLN
OPHTHALMIC | Status: DC | PRN
  Administered 2021-12-04: 0.2 mL via OPHTHALMIC

## 2021-12-04 MED ORDER — SIGHTPATH DOSE#1 NA HYALUR & NA CHOND-NA HYALUR IO KIT
PACK | INTRAOCULAR | Status: DC | PRN
  Administered 2021-12-04: 1 via OPHTHALMIC

## 2021-12-04 SURGICAL SUPPLY — 11 items
CATARACT SUITE SIGHTPATH (MISCELLANEOUS) ×2 IMPLANT
FEE CATARACT SUITE SIGHTPATH (MISCELLANEOUS) ×1 IMPLANT
GLOVE SRG 8 PF TXTR STRL LF DI (GLOVE) ×1 IMPLANT
GLOVE SURG ENC TEXT LTX SZ7.5 (GLOVE) ×2 IMPLANT
GLOVE SURG UNDER POLY LF SZ8 (GLOVE) ×2
LENS IOL TECNIS EYHANCE 21.0 (Intraocular Lens) ×1 IMPLANT
NDL FILTER BLUNT 18X1 1/2 (NEEDLE) ×1 IMPLANT
NEEDLE FILTER BLUNT 18X 1/2SAF (NEEDLE) ×1
NEEDLE FILTER BLUNT 18X1 1/2 (NEEDLE) ×1 IMPLANT
SYR 3ML LL SCALE MARK (SYRINGE) ×2 IMPLANT
WATER STERILE IRR 250ML POUR (IV SOLUTION) ×2 IMPLANT

## 2021-12-04 NOTE — Anesthesia Postprocedure Evaluation (Signed)
Anesthesia Post Note ? ?Patient: Claudia Martin ? ?Procedure(s) Performed: CATARACT EXTRACTION PHACO AND INTRAOCULAR LENS PLACEMENT ([facility]) LEFT (Left: Eye) ? ? ?  ?Patient location during evaluation: PACU ?Anesthesia Type: MAC ?Level of consciousness: awake and alert ?Pain management: pain level controlled ?Vital Signs Assessment: post-procedure vital signs reviewed and stable ?Respiratory status: spontaneous breathing, nonlabored ventilation, respiratory function stable and patient connected to nasal cannula oxygen ?Cardiovascular status: stable and blood pressure returned to baseline ?Postop Assessment: no apparent nausea or vomiting ?Anesthetic complications: no ? ? ?No notable events documented. ? ?Mckinnley Smithey A  Nawaf Strange ? ? ? ? ? ?

## 2021-12-04 NOTE — Op Note (Signed)
OPERATIVE NOTE ? ?Claudia Martin ?[id number] ?12/04/2021 ? ? ?PREOPERATIVE DIAGNOSIS:  Nuclear sclerotic cataract left eye. H25.12 ?  ?POSTOPERATIVE DIAGNOSIS:    Nuclear sclerotic cataract left eye.   ?  ?PROCEDURE:  Phacoemusification with posterior chamber intraocular lens placement of the left eye  ?Ultrasound time: Procedure(s) with comments: ?CATARACT EXTRACTION PHACO AND INTRAOCULAR LENS PLACEMENT ([facility]) LEFT (Left) - 6.23 ?00:48.0 ? ?LENS:   ?Implant Name Type Inv. Item Serial No. Manufacturer Lot No. LRB No. Used Action  ?LENS IOL TECNIS EYHANCE 21.0 - [id number] Intraocular Lens LENS IOL TECNIS EYHANCE 21.0 [id number] [employer]  Left 1 Implanted  ?   ? ?SURGEON:  Wyonia Hough, MD ?  ?ANESTHESIA:  Topical with tetracaine drops and 2% Xylocaine jelly, augmented with 1% preservative-free intracameral lidocaine. ? ?  ?COMPLICATIONS:  None. ?  ?DESCRIPTION OF PROCEDURE:  The patient was identified in the holding room and transported to the operating room and placed in the supine position under the operating microscope.  The left eye was identified as the operative eye and it was prepped and draped in the usual sterile ophthalmic fashion. ?  ?A 1 millimeter clear-corneal paracentesis was made at the 1:30 position.  0.5 ml of preservative-free 1% lidocaine was injected into the anterior chamber. ? The anterior chamber was filled with Viscoat viscoelastic.  A 2.4 millimeter keratome was used to make a near-clear corneal incision at the 10:30 position.  .  A curvilinear capsulorrhexis was made with a cystotome and capsulorrhexis forceps.  Balanced salt solution was used to hydrodissect and hydrodelineate the nucleus. ?  ?Phacoemulsification was then used in stop and chop fashion to remove the lens nucleus and epinucleus.  The remaining cortex was then removed using the irrigation and aspiration handpiece. Provisc was then placed into the capsular bag to distend it for lens placement.  A lens was  then injected into the capsular bag.  The remaining viscoelastic was aspirated. ?  ?Wounds were hydrated with balanced salt solution.  The anterior chamber was inflated to a physiologic pressure with balanced salt solution.  No wound leaks were noted. Vigamox 0.2 ml of a 1mg  per ml solution was injected into the anterior chamber for a dose of 0.2 mg of intracameral antibiotic at the completion of the case. ?  Timolol and Brimonidine drops were applied to the eye.  The patient was taken to the recovery room in stable condition without complications of anesthesia or surgery. ? ?[name] ?12/04/2021, 9:20 AM ? ?

## 2021-12-04 NOTE — Transfer of Care (Signed)
Immediate Anesthesia Transfer of Care Note ? ?Patient: Claudia Martin ? ?Procedure(s) Performed: CATARACT EXTRACTION PHACO AND INTRAOCULAR LENS PLACEMENT ([facility]) LEFT (Left: Eye) ? ?Patient Location: PACU ? ?Anesthesia Type: MAC ? ?Level of Consciousness: awake, alert  and patient cooperative ? ?Airway and Oxygen Therapy: Patient Spontanous Breathing and Patient connected to supplemental oxygen ? ?Post-op Assessment: Post-op Vital signs reviewed, Patient's Cardiovascular Status Stable, Respiratory Function Stable, Patent Airway and No signs of Nausea or vomiting ? ?Post-op Vital Signs: Reviewed and stable ? ?Complications: No notable events documented. ? ?

## 2021-12-04 NOTE — Anesthesia Preprocedure Evaluation (Signed)
Anesthesia Evaluation  ?Patient identified by MRN, date of birth, ID band ?Patient awake ? ? ? ?Reviewed: ?Allergy & Precautions, NPO status , Patient's Chart, lab work & pertinent test results, reviewed documented beta blocker date and time  ? ?History of Anesthesia Complications ?Negative for: history of anesthetic complications ? ?Airway ?Mallampati: III ? ?TM Distance: >3 FB ?Neck ROM: Limited ? ? ? Dental ? ?(+)  ?  ?Pulmonary ?sleep apnea ,  ?  ?breath sounds clear to auscultation ? ? ? ? ? ? Cardiovascular ?Exercise Tolerance: Poor ?hypertension, (-) angina+ CAD, + Past MI and + Cardiac Stents (Stent 06/2021, on Brilinta)  ?(-) DOE  ?Rhythm:Regular Rate:Normal ? ? ?TTE (07/2021): ?LVEF 55%, mild LVH ?Trivial TR, mild MR ?  ?Neuro/Psych ? Neuromuscular disease (Sciatica)   ? GI/Hepatic ?GERD  , ?Diverticulosis ?  ?Endo/Other  ?Hypothyroidism  ? Renal/GU ?Renal disease  ? ?  ?Musculoskeletal ? ?(+) Arthritis ,  ? Abdominal ?(+) + obese (BMI 41),   ?Peds ? Hematology ?  ?Anesthesia Other Findings ? ? Reproductive/Obstetrics ? ?  ? ? ? ? ? ? ? ? ? ? ? ? ? ?  ?  ? ? ? ? ? ? ? ?Anesthesia Physical ?Anesthesia Plan ? ?ASA: 3 ? ?Anesthesia Plan: MAC  ? ?Post-op Pain Management:   ? ?Induction: Intravenous ? ?PONV Risk Score and Plan: 2 and TIVA, Midazolam and Treatment may vary due to age or medical condition ? ?Airway Management Planned: Nasal Cannula ? ?Additional Equipment:  ? ?Intra-op Plan:  ? ?Post-operative Plan:  ? ?Informed Consent: I have reviewed the patients History and Physical, chart, labs and discussed the procedure including the risks, benefits and alternatives for the proposed anesthesia with the patient or authorized representative who has indicated his/her understanding and acceptance.  ? ? ? ? ? ?Plan Discussed with: CRNA and Anesthesiologist ? ?Anesthesia Plan Comments:   ? ? ? ? ? ? ?Anesthesia Quick Evaluation ? ?

## 2021-12-04 NOTE — H&P (Signed)
?[facility]  ? ?Primary Care Physician:  Jonetta Osgood, NP ?Ophthalmologist: Dr. Leandrew Koyanagi ? ?Pre-Procedure History & Physical: ?HPI:  Claudia Martin is a 76 y.o. female here for ophthalmic surgery. ?  ?Past Medical History:  ?Diagnosis Date  ? Abdominal pain   ? Allergic rhinitis   ? Arthritis   ? Candidiasis of vulva and vagina   ? Cellulitis and abscess   ? Diverticulosis   ? Dysuria   ? Enlarged heart   ? Eyelid inflammation   ? GERD (gastroesophageal reflux disease)   ? Hematuria syndrome   ? Hemorrhoids   ? Herpes genitalis   ? Herpes simplex   ? HTN (hypertension)   ? Hydronephrosis   ? Hypothyroidism   ? Incomplete bladder emptying   ? Internal hordeolum   ? Knee pain   ? Otalgia   ? Primary ovarian failure   ? Sciatica   ? Shortness of breath   ? Sinus bradycardia   ? Sleep apnea   ? uses CPAP  ? Urethral diverticulum   ? UTI (lower urinary tract infection)   ? Vaginal atrophy   ? Vaginitis   ? ? ?Past Surgical History:  ?Procedure Laterality Date  ? BREAST BIOPSY    ? CATARACT EXTRACTION W/PHACO Right 06/05/2021  ? Procedure: CATARACT EXTRACTION PHACO AND INTRAOCULAR LENS PLACEMENT ([place]) RIGHT;  Surgeon: Leandrew Koyanagi, MD;  Location: [place];  Service: Ophthalmology;  Laterality: Right;  3.02 ?00:39.5  ? COLON SURGERY    ? COLONOSCOPY WITH PROPOFOL N/A 03/15/2018  ? Procedure: COLONOSCOPY WITH PROPOFOL;  Surgeon: Lin Landsman, MD;  Location: [facility] ENDOSCOPY;  Service: Gastroenterology;  Laterality: N/A;  ? COLONOSCOPY WITH PROPOFOL N/A 01/09/2021  ? Procedure: COLONOSCOPY WITH PROPOFOL;  Surgeon: Lin Landsman, MD;  Location[facility] END[facility]COPY;  Service: Gastroenterology;  Laterality: N/A;  ? CORONARY/GRAFT ACUTE MI REVASCULARIZATION N/A 06/06/2021  ? Procedure: Coronary/Graft Acute MI Revascularization;  Surgeon: Yolonda Kida, MD;  Location: [place] CV LAB;  Service: Cardiovascular;  Laterality: N/A;  ? diverticulitis    ? ECTOPIC  PREGNANCY SURGERY    ? fx thumb    ? HERNIA REPAIR    ? LEFT HEART CATH AND CORONARY ANGIOGRAPHY N/A 06/06/2021  ? Procedure: LEFT HEART CATH AND CORONARY ANGIOGRAPHY;  Surgeon: Yolonda Kida, MD;  Location: [place] CV LAB;  Service: Cardiovascular;  Laterality: N/A;  ? ? ?Prior to Admission medications   ?Medication Sig Start Date End Date Taking? Authorizing Provider  ?amLODipine (NORVASC) 2.5 MG tablet Take 2.5 mg by mouth 2 (two) times daily. 06/11/21  Yes [provider]  ?aspirin 81 MG chewable tablet Chew 1 tablet (81 mg total) by mouth daily. 06/10/21  Yes Nolberto Hanlon, MD  ?BRILINTA 90 MG TABS tablet Take 90 mg by mouth 2 (two) times daily. 11/08/21  Yes [provider]  ?cetirizine (ZYRTEC) 10 MG tablet Take 10 mg by mouth daily.   Yes [provider]  ?famciclovir (FAMVIR) 500 MG tablet Take 1 tablet (500 mg total) by mouth 2 (two) times daily. During outbreak/flare-up until resolved. 12/02/21  Yes Abernathy, Yetta Flock, NP  ?levothyroxine (SYNTHROID) 50 MCG tablet TAKE 1 TABLET(50 MCG) BY MOUTH DAILY BEFORE AND BREAKFAST 09/09/21  Yes Abernathy, Alyssa, NP  ?losartan (COZAAR) 25 MG tablet Take 1 tablet by mouth daily. 07/04/21  Yes [provider]  ?metoprolol tartrate (LOPRESSOR) 25 MG tablet Take 1 tablet (25 mg total) by mouth 2 (two) times daily. 06/09/21  12/04/21 Yes Nolberto Hanlon, MD  ?rosuvastatin (CRESTOR) 5 MG tablet Take 5 mg by mouth daily. 09/09/21  Yes [provider]  ?chlorhexidine (PERIDEX) 0.12 % solution Use as directed 5 mLs in the mouth or throat 2 (two) times daily. 05/31/21   Jonetta Osgood, NP  ?conjugated estrogens (PREMARIN) vaginal cream Use 1 applicatorful twice weekly. ?Patient taking differently: Place 1 Applicatorful vaginally 2 (two) times a week. Use just a little bit, not an applicatorful. 05/22/21   Lavera Guise, MD  ?EPINEPHrine 0.3 mg/0.3 mL IJ SOAJ injection as needed. 08/24/14   [provider]  ?ILEVRO 0.3 % ophthalmic  suspension Place 1 drop into the right eye daily. 05/22/21   [provider]  ?moxifloxacin (VIGAMOX) 0.5 % ophthalmic solution Place 1 drop into the right eye 3 (three) times daily.    [provider]  ?nitroGLYCERIN (NITROSTAT) 0.4 MG SL tablet Place 1 tablet (0.4 mg total) under the tongue every 5 (five) minutes x 3 doses as needed for chest pain. 06/09/21 07/09/21  Nolberto Hanlon, MD  ?prednisoLONE acetate (PRED FORTE) 1 % ophthalmic suspension Place 1 drop into the right eye 4 (four) times daily. 05/23/21   [provider]  ?Zoster Vaccine Adjuvanted [facility]) injection Inject 0.5 mLs into the muscle once.    [provider]  ? ? ?Allergies as of 10/29/2021 - Review Complete 10/10/2021  ?Allergen Reaction Noted  ? Other Diarrhea and Hives 06/26/2015  ? Augmentin [amoxicillin-pot clavulanate]  12/07/2019  ? Bactrim [sulfamethoxazole-trimethoprim] Hives 12/23/2018  ? Ibuprofen  07/16/2015  ? Ivp dye [iodinated contrast media] Diarrhea and Hives 06/26/2015  ? Macrobid [nitrofurantoin]  12/31/2018  ? Prednisone Nausea Only 06/26/2015  ? ? ?Family History  ?Problem Relation Age of Onset  ? Tuberculosis Mother   ? Nephrolithiasis Brother   ? Vision loss Daughter   ? Hypertension Daughter   ? ? ?Social History  ? ?Socioeconomic History  ? Marital status: Divorced  ?  Spouse name: Not on file  ? Number of children: Not on file  ? Years of education: Not on file  ? Highest education level: Not on file  ?Occupational History  ? Not on file  ?Tobacco Use  ? Smoking status: Never  ? Smokeless tobacco: Never  ?Vaping Use  ? Vaping Use: Never used  ?Substance and Sexual Activity  ? Alcohol use: No  ? Drug use: Never  ? Sexual activity: Not on file  ?Other Topics Concern  ? Not on file  ?Social History Narrative  ? Not on file  ? ?Social Determinants of Health  ? ?Financial Resource Strain: Not on file  ?Food Insecurity: Not on file  ?Transportation Needs: Not on file  ?Physical Activity: Not on  file  ?Stress: Not on file  ?Social Connections: Not on file  ?Intimate Partner Violence: Not on file  ? ? ?Review of Systems: ?See HPI, otherwise negative ROS ? ?Physical Exam: ?BP (!) 154/85   Pulse 73   Temp 97.9 ?F (36.6 ?C)   Wt 79.2 kg   SpO2 96%   BMI 40.58 kg/m?  ?General:   Alert,  pleasant and cooperative in NAD ?Head:  Normocephalic and atraumatic. ?Lungs:  Clear to auscultation.    ?Heart:  Regular rate and rhythm.  ? ?Impression/Plan: ?Claudia Martin is here for ophthalmic surgery. ? ?Risks, benefits, limitations, and alternatives regarding ophthalmic surgery have been reviewed with the patient.  Questions have been answered.  All parties agreeable. ? ? Leandrew Koyanagi, MD  12/04/2021, 8:16 AM ? ?

## 2021-12-05 ENCOUNTER — Encounter: Payer: Self-pay | Admitting: Ophthalmology

## 2021-12-05 DIAGNOSIS — J3089 Other allergic rhinitis: Secondary | ICD-10-CM | POA: Diagnosis not present

## 2021-12-05 DIAGNOSIS — J301 Allergic rhinitis due to pollen: Secondary | ICD-10-CM | POA: Diagnosis not present

## 2021-12-05 DIAGNOSIS — J3081 Allergic rhinitis due to animal (cat) (dog) hair and dander: Secondary | ICD-10-CM | POA: Diagnosis not present

## 2021-12-05 LAB — UA/M W/RFLX CULTURE, ROUTINE
Bilirubin, UA: NEGATIVE
Glucose, UA: NEGATIVE
Ketones, UA: NEGATIVE
Nitrite, UA: NEGATIVE
Specific Gravity, UA: 1.02 (ref 1.005–1.030)
Urobilinogen, Ur: 1 mg/dL (ref 0.2–1.0)
pH, UA: 5.5 (ref 5.0–7.5)

## 2021-12-05 LAB — URINE CULTURE, REFLEX

## 2021-12-05 LAB — MICROSCOPIC EXAMINATION
Casts: NONE SEEN /lpf
Epithelial Cells (non renal): >10 /hpf — AB (ref 0–10)
WBC, UA: >30 /hpf — AB (ref 0–5)

## 2021-12-09 ENCOUNTER — Other Ambulatory Visit: Payer: Self-pay

## 2021-12-09 MED ORDER — ROSUVASTATIN CALCIUM 5 MG PO TABS: 5.0000 mg | ORAL_TABLET | Freq: Every day | ORAL | 3 refills | Status: DC

## 2021-12-10 DIAGNOSIS — J301 Allergic rhinitis due to pollen: Secondary | ICD-10-CM | POA: Diagnosis not present

## 2021-12-10 DIAGNOSIS — J3081 Allergic rhinitis due to animal (cat) (dog) hair and dander: Secondary | ICD-10-CM | POA: Diagnosis not present

## 2021-12-10 DIAGNOSIS — J3089 Other allergic rhinitis: Secondary | ICD-10-CM | POA: Diagnosis not present

## 2021-12-17 ENCOUNTER — Telehealth: Payer: Self-pay | Admitting: Student-PharmD

## 2021-12-17 NOTE — Progress Notes (Addendum)
Hypertension (HTN) Review Call  ? ?Claudia Martin, Claudia Martin [id number] ?78 years, Female  DOB: 11/04/1945  M: [phone]) [phone] ? ?Hypertension Review (HC) ?Completed by Charlann Lange on 12/17/2021 ? ?Chart Review ?Is the patient enrolled in RPM with BP Monitor?: No ? ?BP #1 reading (last): 140/75 ?on: 12/02/2021 ? ?BP #2 reading: 125/77 ?on: 10/10/2021 ? ?BP #3 reading: 148/88 ?on: 09/26/2021 ? ?Any of the last 3 BP > 140/90 mmHg?: Yes ? ?What recent interventions have been made by any provider to improve the patient's conditions in the last 3 months?: Office Visit: ?12/02/21 Jonetta Osgood, NP. For Encounter for routine adult health examination. STARTED Famciclovir 500 mg 2 times daily During outbreak/flare-up until resolved and Fluconazole 150 mg daily. STOPPED Atorvastatin and Valacyclovir. ? ?Any recent hospitalizations or ED visits since last visit with CPP?: Yes ? ?Brief Summary (including Medication and/or Diagnosis changes):: 12/04/21 [facility] Leandrew Koyanagi, MD. (2 Hours) For CATARACT EXTRACTION PHACO AND INTRAOCULAR LENS PLACEMENT. No medication changes. ? ?Adherence Review ?Adherence rates for STAR metric medications: Rosuvastatin 5 mg 09/09/21 90 DS ?Losartan 25 mg 09/25/21 90 DS ? ?Adherence rates for medications indicated for disease state being reviewed: Losartan 25 mg 09/25/21 90 DS ? ?Does the patient have >5 day gap between last estimated fill dates for any of the above medications?: Yes ? ?Reasons for medication gaps: Rosuvastatin 5 mg 09/09/21 90 DS - Unknown ? ?Disease State Questions ?Able to connect with the Patient?: Yes ? ?Is the patient monitoring his/her BP?: Yes ? ?How often are you checking your BP?: occasionally ? ?Home BP Reading #1 (most recent): 135/76 ? ?Is the patient having any low BP Readings <90/60?: No ? ?Is the patient having any BP readings above >180/100?: No ? ?Is the patient's average BP>140/90?: No ? ?What is your blood pressure goal?: 120/80 ? ?Educate patient  to inform proper points on checking BP at home:: Do not drink caffeine or smoke a cigarette at least 30 min. prior to checking., Sit with feet flat on the floor, arm at heart level. ? ?What diet changes have you made to improve your Blood Pressure Control?: eating more home-cooked meals ?What exercise are you doing to improve your Blood Pressure Control?: no formal exercise ? ?Engagement Notes ?McLemore, Veronica on 12/17/2021 02:54 PM ?[facility] Chart Review: 10 min 12/17/21 ?[facility] Assessment call time spent: 10 min 12/17/21 ? ?Charlann Lange, RMA ?Healthcare Concierge  ?[phone] ? ?Pharmacist Review ?Adherence gaps identified?: No ?Drug Therapy Problems identified?: No ?Assessment: Controlled ? ?Alena Bills ?Clinical Pharmacist ? ?

## 2021-12-19 DIAGNOSIS — J301 Allergic rhinitis due to pollen: Secondary | ICD-10-CM | POA: Diagnosis not present

## 2021-12-19 DIAGNOSIS — J3089 Other allergic rhinitis: Secondary | ICD-10-CM | POA: Diagnosis not present

## 2021-12-19 DIAGNOSIS — J3081 Allergic rhinitis due to animal (cat) (dog) hair and dander: Secondary | ICD-10-CM | POA: Diagnosis not present

## 2021-12-24 DIAGNOSIS — I2121 ST elevation (STEMI) myocardial infarction involving left circumflex coronary artery: Secondary | ICD-10-CM | POA: Diagnosis not present

## 2021-12-24 DIAGNOSIS — G4733 Obstructive sleep apnea (adult) (pediatric): Secondary | ICD-10-CM | POA: Diagnosis not present

## 2021-12-24 DIAGNOSIS — I2510 Atherosclerotic heart disease of native coronary artery without angina pectoris: Secondary | ICD-10-CM | POA: Diagnosis not present

## 2021-12-24 DIAGNOSIS — I10 Essential (primary) hypertension: Secondary | ICD-10-CM | POA: Diagnosis not present

## 2021-12-24 DIAGNOSIS — E782 Mixed hyperlipidemia: Secondary | ICD-10-CM | POA: Diagnosis not present

## 2021-12-24 DIAGNOSIS — R040 Epistaxis: Secondary | ICD-10-CM | POA: Diagnosis not present

## 2021-12-25 ENCOUNTER — Other Ambulatory Visit: Payer: Self-pay | Admitting: Internal Medicine

## 2021-12-25 DIAGNOSIS — B009 Herpesviral infection, unspecified: Secondary | ICD-10-CM

## 2021-12-26 DIAGNOSIS — J301 Allergic rhinitis due to pollen: Secondary | ICD-10-CM | POA: Diagnosis not present

## 2021-12-26 DIAGNOSIS — J3081 Allergic rhinitis due to animal (cat) (dog) hair and dander: Secondary | ICD-10-CM | POA: Diagnosis not present

## 2021-12-26 DIAGNOSIS — J3089 Other allergic rhinitis: Secondary | ICD-10-CM | POA: Diagnosis not present

## 2021-12-27 DIAGNOSIS — Z961 Presence of intraocular lens: Secondary | ICD-10-CM | POA: Diagnosis not present

## 2021-12-30 ENCOUNTER — Other Ambulatory Visit: Payer: Self-pay

## 2021-12-30 DIAGNOSIS — G4733 Obstructive sleep apnea (adult) (pediatric): Secondary | ICD-10-CM | POA: Diagnosis not present

## 2021-12-30 MED ORDER — VALACYCLOVIR HCL 1 G PO TABS: ORAL_TABLET | ORAL | 3 refills | Status: DC

## 2022-01-02 DIAGNOSIS — J3081 Allergic rhinitis due to animal (cat) (dog) hair and dander: Secondary | ICD-10-CM | POA: Diagnosis not present

## 2022-01-02 DIAGNOSIS — J3089 Other allergic rhinitis: Secondary | ICD-10-CM | POA: Diagnosis not present

## 2022-01-02 DIAGNOSIS — J301 Allergic rhinitis due to pollen: Secondary | ICD-10-CM | POA: Diagnosis not present

## 2022-01-04 DIAGNOSIS — G4733 Obstructive sleep apnea (adult) (pediatric): Secondary | ICD-10-CM | POA: Diagnosis not present

## 2022-01-07 DIAGNOSIS — J301 Allergic rhinitis due to pollen: Secondary | ICD-10-CM | POA: Diagnosis not present

## 2022-01-07 DIAGNOSIS — J3089 Other allergic rhinitis: Secondary | ICD-10-CM | POA: Diagnosis not present

## 2022-01-07 DIAGNOSIS — J3081 Allergic rhinitis due to animal (cat) (dog) hair and dander: Secondary | ICD-10-CM | POA: Diagnosis not present

## 2022-01-10 ENCOUNTER — Other Ambulatory Visit: Payer: Self-pay | Admitting: Nurse Practitioner

## 2022-01-10 DIAGNOSIS — K121 Other forms of stomatitis: Secondary | ICD-10-CM

## 2022-01-14 DIAGNOSIS — J301 Allergic rhinitis due to pollen: Secondary | ICD-10-CM | POA: Diagnosis not present

## 2022-01-14 DIAGNOSIS — J3089 Other allergic rhinitis: Secondary | ICD-10-CM | POA: Diagnosis not present

## 2022-01-14 DIAGNOSIS — J3081 Allergic rhinitis due to animal (cat) (dog) hair and dander: Secondary | ICD-10-CM | POA: Diagnosis not present

## 2022-01-21 DIAGNOSIS — J3089 Other allergic rhinitis: Secondary | ICD-10-CM | POA: Diagnosis not present

## 2022-01-21 DIAGNOSIS — J3081 Allergic rhinitis due to animal (cat) (dog) hair and dander: Secondary | ICD-10-CM | POA: Diagnosis not present

## 2022-01-21 DIAGNOSIS — J301 Allergic rhinitis due to pollen: Secondary | ICD-10-CM | POA: Diagnosis not present

## 2022-01-24 DIAGNOSIS — J3089 Other allergic rhinitis: Secondary | ICD-10-CM | POA: Diagnosis not present

## 2022-01-24 DIAGNOSIS — J3081 Allergic rhinitis due to animal (cat) (dog) hair and dander: Secondary | ICD-10-CM | POA: Diagnosis not present

## 2022-01-28 DIAGNOSIS — J3081 Allergic rhinitis due to animal (cat) (dog) hair and dander: Secondary | ICD-10-CM | POA: Diagnosis not present

## 2022-01-28 DIAGNOSIS — J3089 Other allergic rhinitis: Secondary | ICD-10-CM | POA: Diagnosis not present

## 2022-01-28 DIAGNOSIS — J301 Allergic rhinitis due to pollen: Secondary | ICD-10-CM | POA: Diagnosis not present

## 2022-02-06 DIAGNOSIS — J3081 Allergic rhinitis due to animal (cat) (dog) hair and dander: Secondary | ICD-10-CM | POA: Diagnosis not present

## 2022-02-06 DIAGNOSIS — J3089 Other allergic rhinitis: Secondary | ICD-10-CM | POA: Diagnosis not present

## 2022-02-06 DIAGNOSIS — J301 Allergic rhinitis due to pollen: Secondary | ICD-10-CM | POA: Diagnosis not present

## 2022-02-13 DIAGNOSIS — J3089 Other allergic rhinitis: Secondary | ICD-10-CM | POA: Diagnosis not present

## 2022-02-13 DIAGNOSIS — J301 Allergic rhinitis due to pollen: Secondary | ICD-10-CM | POA: Diagnosis not present

## 2022-02-13 DIAGNOSIS — J3081 Allergic rhinitis due to animal (cat) (dog) hair and dander: Secondary | ICD-10-CM | POA: Diagnosis not present

## 2022-02-21 DIAGNOSIS — J3089 Other allergic rhinitis: Secondary | ICD-10-CM | POA: Diagnosis not present

## 2022-02-21 DIAGNOSIS — J3081 Allergic rhinitis due to animal (cat) (dog) hair and dander: Secondary | ICD-10-CM | POA: Diagnosis not present

## 2022-02-21 DIAGNOSIS — J301 Allergic rhinitis due to pollen: Secondary | ICD-10-CM | POA: Diagnosis not present

## 2022-02-25 ENCOUNTER — Ambulatory Visit (INDEPENDENT_AMBULATORY_CARE_PROVIDER_SITE_OTHER): Payer: Medicare Other | Admitting: Nurse Practitioner

## 2022-02-25 ENCOUNTER — Encounter: Payer: Self-pay | Admitting: Nurse Practitioner

## 2022-02-25 VITALS — BP 140/70 | HR 80 | Temp 98.8°F | Resp 16 | Ht <= 58 in | Wt 176.4 lb

## 2022-02-25 DIAGNOSIS — K1230 Oral mucositis (ulcerative), unspecified: Secondary | ICD-10-CM | POA: Diagnosis not present

## 2022-02-25 DIAGNOSIS — I2510 Atherosclerotic heart disease of native coronary artery without angina pectoris: Secondary | ICD-10-CM | POA: Diagnosis not present

## 2022-02-25 DIAGNOSIS — E039 Hypothyroidism, unspecified: Secondary | ICD-10-CM

## 2022-02-25 DIAGNOSIS — K121 Other forms of stomatitis: Secondary | ICD-10-CM

## 2022-02-25 DIAGNOSIS — J3089 Other allergic rhinitis: Secondary | ICD-10-CM | POA: Diagnosis not present

## 2022-02-25 DIAGNOSIS — I10 Essential (primary) hypertension: Secondary | ICD-10-CM

## 2022-02-25 DIAGNOSIS — I2584 Coronary atherosclerosis due to calcified coronary lesion: Secondary | ICD-10-CM

## 2022-02-25 DIAGNOSIS — J3081 Allergic rhinitis due to animal (cat) (dog) hair and dander: Secondary | ICD-10-CM | POA: Diagnosis not present

## 2022-02-25 DIAGNOSIS — J301 Allergic rhinitis due to pollen: Secondary | ICD-10-CM | POA: Diagnosis not present

## 2022-02-25 MED ORDER — CHLORHEXIDINE GLUCONATE 0.12 % MT SOLN: OROMUCOSAL | 1 refills | Status: DC

## 2022-02-25 MED ORDER — ROSUVASTATIN CALCIUM 5 MG PO TABS: 5.0000 mg | ORAL_TABLET | Freq: Every day | ORAL | 3 refills | Status: DC

## 2022-02-25 MED ORDER — LEVOTHYROXINE SODIUM 50 MCG PO TABS: ORAL_TABLET | ORAL | 1 refills | Status: DC

## 2022-02-25 NOTE — Progress Notes (Signed)
[facility] [place], [place] [address]  Internal MEDICINE  Office Visit Note  Patient Name: Claudia Martin  [id number]  [id number]  Date of Service: 02/25/2022  Chief Complaint  Patient presents with   Follow-up   Gastroesophageal Reflux   Hypertension    HPI Yu presents for follow-up visit for hypertension, coronary artery disease and hypothyroidism.  She has been doing well and is in good spirits today her blood pressure stable on current medications and she does not have any other concerns today.  And she denies any new or worsening pain.  She needs medication refills today.    Current Medication: Outpatient Encounter Medications as of 02/25/2022  Medication Sig Note   aspirin 81 MG chewable tablet Chew 1 tablet (81 mg total) by mouth daily.    BRILINTA 90 MG TABS tablet Take 90 mg by mouth 2 (two) times daily.    cetirizine (ZYRTEC) 10 MG tablet Take 10 mg by mouth daily.    conjugated estrogens (PREMARIN) vaginal cream Use 1 applicatorful twice weekly. (Patient taking differently: Place 1 Applicatorful vaginally 2 (two) times a week. Use just a little bit, not an applicatorful.)    EPINEPHrine 0.3 mg/0.3 mL IJ SOAJ injection as needed. 07/16/2015: Received from: [place]   losartan (COZAAR) 25 MG tablet Take 1 tablet by mouth daily.    valACYclovir (VALTREX) 1000 MG tablet TAKE 1 TABLET(1000 MG) BY MOUTH DAILY. CAN TAKE 1 TABLET TWICE DAILY IF FLARE UP FOR 7 DAYS    [DISCONTINUED] amLODipine (NORVASC) 2.5 MG tablet Take 2.5 mg by mouth 2 (two) times daily.    [DISCONTINUED] chlorhexidine (PERIDEX) 0.12 % solution USE 5 ML AS DIRECTED IN THE MOUTH OR THROAT TWICE DAILY    [DISCONTINUED] ILEVRO 0.3 % ophthalmic suspension Place 1 drop into the right eye daily.    [DISCONTINUED] levothyroxine (SYNTHROID) 50 MCG tablet TAKE 1 TABLET(50 MCG) BY MOUTH DAILY BEFORE AND BREAKFAST    [DISCONTINUED] moxifloxacin (VIGAMOX) 0.5 %  ophthalmic solution Place 1 drop into the right eye 3 (three) times daily.    [DISCONTINUED] prednisoLONE acetate (PRED FORTE) 1 % ophthalmic suspension Place 1 drop into the right eye 4 (four) times daily.    [DISCONTINUED] rosuvastatin (CRESTOR) 5 MG tablet Take 1 tablet (5 mg total) by mouth daily.    [DISCONTINUED] Zoster Vaccine Adjuvanted [facility]) injection Inject 0.5 mLs into the muscle once.    chlorhexidine (PERIDEX) 0.12 % solution USE 5 ML AS DIRECTED IN THE MOUTH OR THROAT TWICE DAILY    levothyroxine (SYNTHROID) 50 MCG tablet TAKE 1 TABLET(50 MCG) BY MOUTH DAILY BEFORE AND BREAKFAST    metoprolol tartrate (LOPRESSOR) 25 MG tablet Take 1 tablet (25 mg total) by mouth 2 (two) times daily.    nitroGLYCERIN (NITROSTAT) 0.4 MG SL tablet Place 1 tablet (0.4 mg total) under the tongue every 5 (five) minutes x 3 doses as needed for chest pain. 12/04/2021: Never used    rosuvastatin (CRESTOR) 5 MG tablet Take 1 tablet (5 mg total) by mouth daily.    No facility-administered encounter medications on file as of 02/25/2022.    Surgical History: Past Surgical History:  Procedure Laterality Date   BREAST BIOPSY     CATARACT EXTRACTION W/PHACO Right 06/05/2021   Procedure: CATARACT EXTRACTION PHACO AND INTRAOCULAR LENS PLACEMENT ([place]) RIGHT;  Surgeon: Leandrew Koyanagi, MD;  Location: [place];  Service: Ophthalmology;  Laterality: Right;  3.02 00:39.5   CATARACT EXTRACTION [facility] Left 12/04/2021  Procedure: CATARACT EXTRACTION PHACO AND INTRAOCULAR LENS PLACEMENT ([place]) LEFT;  Surgeon: Leandrew Koyanagi, MD;  Location: [place];  Service: Ophthalmology;  Laterality: Left;  6.23 00:48.0   COLON SURGERY     COLONOSCOPY WITH PROPOFOL N/A 03/15/2018   Procedure: COLONOSCOPY WITH PROPOFOL;  Surgeon: Lin Landsman, MD;  Location: [facility] ENDOSCOPY;  Service: Gastroenterology;  Laterality: N/A;   COLONOSCOPY WITH PROPOFOL N/A 01/09/2021   Procedure: COLONOSCOPY WITH  PROPOFOL;  Surgeon: Lin Landsman, MD;  Location: [facility] ENDOSCOPY;  Service: Gastroenterology;  Laterality: N/A;   CORONARY/GRAFT ACUTE MI REVASCULARIZATION N/A 06/06/2021   Procedure: Coronary/Graft Acute MI Revascularization;  Surgeon: Yolonda Kida, MD;  Location: [place] CV LAB;  Service: Cardiovascular;  Laterality: N/A;   diverticulitis     ECTOPIC PREGNANCY SURGERY     fx thumb     HERNIA REPAIR     LEFT HEART CATH AND CORONARY ANGIOGRAPHY N/A 06/06/2021   Procedure: LEFT HEART CATH AND CORONARY ANGIOGRAPHY;  Surgeon: Yolonda Kida, MD;  Location: [place] CV LAB;  Service: Cardiovascular;  Laterality: N/A;    Medical History: Past Medical History:  Diagnosis Date   Abdominal pain    Allergic rhinitis    Arthritis    Candidiasis of vulva and vagina    Cellulitis and abscess    Diverticulosis    Dysuria    Enlarged heart    Eyelid inflammation    GERD (gastroesophageal reflux disease)    Hematuria syndrome    Hemorrhoids    Herpes genitalis    Herpes simplex    HTN (hypertension)    Hydronephrosis    Hypothyroidism    Incomplete bladder emptying    Internal hordeolum    Knee pain    Otalgia    Primary ovarian failure    Sciatica    Shortness of breath    Sinus bradycardia    Sleep apnea    uses CPAP   Urethral diverticulum    UTI (lower urinary tract infection)    Vaginal atrophy    Vaginitis     Family History: Family History  Problem Relation Age of Onset   Tuberculosis Mother    Nephrolithiasis Brother    Vision loss Daughter    Hypertension Daughter     Social History   Socioeconomic History   Marital status: Divorced    Spouse name: Not on file   Number of children: Not on file   Years of education: Not on file   Highest education level: Not on file  Occupational History   Not on file  Tobacco Use   Smoking status: Never    Passive exposure: Never   Smokeless tobacco: Never  Vaping Use   Vaping Use: Never used   Substance and Sexual Activity   Alcohol use: No   Drug use: Never   Sexual activity: Not on file  Other Topics Concern   Not on file  Social History Narrative   Not on file   Social Determinants of Health   Financial Resource Strain: Not on file  Food Insecurity: Not on file  Transportation Needs: Not on file  Physical Activity: Not on file  Stress: Not on file  Social Connections: Not on file  Intimate Partner Violence: Not on file      Review of Systems  Constitutional:  Negative for chills, fatigue and unexpected weight change.  HENT:  Negative for congestion, rhinorrhea, sneezing and sore throat.   Eyes:  Negative for redness.  Respiratory: Negative.  Negative for cough, chest tightness, shortness of breath and wheezing.   Cardiovascular: Negative.  Negative for chest pain and palpitations.  Gastrointestinal:  Negative for abdominal pain, constipation, diarrhea, nausea and vomiting.  Genitourinary:  Negative for dysuria and frequency.  Musculoskeletal:  Negative for arthralgias, back pain, joint swelling and neck pain.  Skin:  Negative for rash.  Neurological: Negative.  Negative for tremors and numbness.  Hematological:  Negative for adenopathy. Does not bruise/bleed easily.  Psychiatric/Behavioral:  Negative for behavioral problems (Depression), sleep disturbance and suicidal ideas. The patient is not nervous/anxious.     Vital Signs: BP 140/70 Comment: 146/74  Pulse 80   Temp 98.8 F (37.1 C)   Resp 16   Ht '4\' 7"'$  (1.397 m)   Wt 176 lb 6.4 oz (80 kg)   SpO2 97%   BMI 41.00 kg/m    Physical Exam Vitals reviewed.  Constitutional:      General: She is not in acute distress.    Appearance: Normal appearance. She is obese. She is not ill-appearing.  HENT:     Head: Normocephalic and atraumatic.  Eyes:     Pupils: Pupils are equal, round, and reactive to light.  Cardiovascular:     Rate and Rhythm: Normal rate and regular rhythm.  Pulmonary:     Effort:  Pulmonary effort is normal. No respiratory distress.  Neurological:     Mental Status: She is alert and oriented to person, place, and time.  Psychiatric:        Mood and Affect: Mood normal.        Behavior: Behavior normal.        Assessment/Plan: 1. Essential hypertension Blood pressure stable on current medications.  2. Coronary artery disease due to calcified coronary lesion Continue rosuvastatin as prescribed, refills ordered.  Continue to follow-up with cardiology as scheduled - rosuvastatin (CRESTOR) 5 MG tablet; Take 1 tablet (5 mg total) by mouth daily.  Dispense: 90 tablet; Refill: 3  3. Acquired hypothyroidism Stable on current dose of levothyroxine, refills ordered. - levothyroxine (SYNTHROID) 50 MCG tablet; TAKE 1 TABLET(50 MCG) BY MOUTH DAILY BEFORE AND BREAKFAST  Dispense: 90 tablet; Refill: 1  4. Stomatitis and mucositis Refills ordered - chlorhexidine (PERIDEX) 0.12 % solution; USE 5 ML AS DIRECTED IN THE MOUTH OR THROAT TWICE DAILY  Dispense: 473 mL; Refill: 1\   General Counseling: Jazalynn verbalizes understanding of the findings of todays visit and agrees with plan of treatment. I have discussed any further diagnostic evaluation that may be needed or ordered today. We also reviewed her medications today. she has been encouraged to call the office with any questions or concerns that should arise related to todays visit.    No orders of the defined types were placed in this encounter.   Meds ordered this encounter  Medications   rosuvastatin (CRESTOR) 5 MG tablet    Sig: Take 1 tablet (5 mg total) by mouth daily.    Dispense:  90 tablet    Refill:  3   levothyroxine (SYNTHROID) 50 MCG tablet    Sig: TAKE 1 TABLET(50 MCG) BY MOUTH DAILY BEFORE AND BREAKFAST    Dispense:  90 tablet    Refill:  1   chlorhexidine (PERIDEX) 0.12 % solution    Sig: USE 5 ML AS DIRECTED IN THE MOUTH OR THROAT TWICE DAILY    Dispense:  473 mL    Refill:  1    For future  refills  Return in about 5 months (around 07/28/2022) for F/U, med refill, Nancie Bocanegra PCP.   Total time spent:30 Minutes Time spent includes review of chart, medications, test results, and follow up plan with the patient.   [place] Controlled Substance Database was reviewed by me.  This patient was seen by Jonetta Osgood, FNP-C in collaboration with Dr. Clayborn Bigness as a part of collaborative care agreement.   Terrill Alperin R. Valetta Fuller, MSN, FNP-C Internal medicine

## 2022-03-11 ENCOUNTER — Ambulatory Visit (INDEPENDENT_AMBULATORY_CARE_PROVIDER_SITE_OTHER): Payer: Medicare Other | Admitting: Nurse Practitioner

## 2022-03-11 ENCOUNTER — Encounter: Payer: Self-pay | Admitting: Nurse Practitioner

## 2022-03-11 VITALS — BP 161/82 | HR 81 | Temp 98.9°F | Resp 16 | Ht <= 58 in | Wt 175.0 lb

## 2022-03-11 DIAGNOSIS — B3731 Acute candidiasis of vulva and vagina: Secondary | ICD-10-CM | POA: Diagnosis not present

## 2022-03-11 DIAGNOSIS — I10 Essential (primary) hypertension: Secondary | ICD-10-CM

## 2022-03-11 DIAGNOSIS — L02211 Cutaneous abscess of abdominal wall: Secondary | ICD-10-CM | POA: Diagnosis not present

## 2022-03-11 DIAGNOSIS — Z8719 Personal history of other diseases of the digestive system: Secondary | ICD-10-CM

## 2022-03-11 MED ORDER — DOXYCYCLINE HYCLATE 100 MG PO TABS: 100.0000 mg | ORAL_TABLET | Freq: Two times a day (BID) | ORAL | 0 refills | Status: DC

## 2022-03-11 MED ORDER — FLUCONAZOLE 150 MG PO TABS
150.0000 mg | ORAL_TABLET | Freq: Once | ORAL | 0 refills | Status: AC
Start: 2022-03-11 — End: 2022-03-11

## 2022-03-11 NOTE — Progress Notes (Signed)
[facility] [place], [place] [address]  Internal MEDICINE  Office Visit Note  Patient Name: Claudia Martin  [id number]  [id number]  Date of Service: 03/11/2022  Chief Complaint  Patient presents with   Acute Visit    Hernia right side under breast      HPI [name] presents for an acute sick visit for possible abdominal hernia.  Patient has a history of having an abdominal hernia that was surgically repaired x2 and feels that it has become more swollen and is protruding and she has not tried to push it back in.  Possible abdominal wall abscess.  There is bruising and color change with redness.  Patient reports that over the weekend she also had swollen lymph nodes of the right axilla and her abdomen is distended as a whole.    Current Medication:  Outpatient Encounter Medications as of 03/11/2022  Medication Sig Note   amLODipine (NORVASC) 2.5 MG tablet Take 2.5 mg by mouth 2 (two) times daily.    aspirin 81 MG chewable tablet Chew 1 tablet (81 mg total) by mouth daily.    BRILINTA 90 MG TABS tablet Take 90 mg by mouth 2 (two) times daily.    cetirizine (ZYRTEC) 10 MG tablet Take 10 mg by mouth daily.    chlorhexidine (PERIDEX) 0.12 % solution USE 5 ML AS DIRECTED IN THE MOUTH OR THROAT TWICE DAILY    conjugated estrogens (PREMARIN) vaginal cream Use 1 applicatorful twice weekly. (Patient taking differently: Place 1 Applicatorful vaginally 2 (two) times a week. Use just a little bit, not an applicatorful.)    doxycycline (VIBRA-TABS) 100 MG tablet Take 1 tablet (100 mg total) by mouth 2 (two) times daily for 14 days.    EPINEPHrine 0.3 mg/0.3 mL IJ SOAJ injection as needed. 07/16/2015: Received from: [facility]   fluconazole (DIFLUCAN) 150 MG tablet Take 1 tablet (150 mg total) by mouth once for 1 dose. May take an additional dose after 3 days if still symptomatic.    ILEVRO 0.3 % ophthalmic suspension Place 1 drop into the right eye  daily.    levothyroxine (SYNTHROID) 50 MCG tablet TAKE 1 TABLET(50 MCG) BY MOUTH DAILY BEFORE AND BREAKFAST    losartan (COZAAR) 25 MG tablet Take 1 tablet by mouth daily.    moxifloxacin (VIGAMOX) 0.5 % ophthalmic solution Place 1 drop into the right eye 3 (three) times daily.    prednisoLONE acetate (PRED FORTE) 1 % ophthalmic suspension Place 1 drop into the right eye 4 (four) times daily.    rosuvastatin (CRESTOR) 5 MG tablet Take 1 tablet (5 mg total) by mouth daily.    valACYclovir (VALTREX) 1000 MG tablet TAKE 1 TABLET(1000 MG) BY MOUTH DAILY. CAN TAKE 1 TABLET TWICE DAILY IF FLARE UP FOR 7 DAYS    Zoster Vaccine Adjuvanted [facility]) injection Inject 0.5 mLs into the muscle once.    metoprolol tartrate (LOPRESSOR) 25 MG tablet Take 1 tablet (25 mg total) by mouth 2 (two) times daily.    nitroGLYCERIN (NITROSTAT) 0.4 MG SL tablet Place 1 tablet (0.4 mg total) under the tongue every 5 (five) minutes x 3 doses as needed for chest pain. 12/04/2021: Never used    No facility-administered encounter medications on file as of 03/11/2022.      Medical History: Past Medical History:  Diagnosis Date   Abdominal pain    Allergic rhinitis    Arthritis    Candidiasis of vulva and vagina  Cellulitis and abscess    Diverticulosis    Dysuria    Enlarged heart    Eyelid inflammation    GERD (gastroesophageal reflux disease)    Hematuria syndrome    Hemorrhoids    Herpes genitalis    Herpes simplex    HTN (hypertension)    Hydronephrosis    Hypothyroidism    Incomplete bladder emptying    Internal hordeolum    Knee pain    Otalgia    Primary ovarian failure    Sciatica    Shortness of breath    Sinus bradycardia    Sleep apnea    uses CPAP   Urethral diverticulum    UTI (lower urinary tract infection)    Vaginal atrophy    Vaginitis      Vital Signs: BP (!) 161/82   Pulse 81   Temp 98.9 F (37.2 C)   Resp 16   Ht '4\' 7"'$  (1.397 m)   Wt 175 lb (79.4 kg)   SpO2 96%   BMI  40.67 kg/m    Review of Systems  Constitutional:  Positive for appetite change.  Respiratory: Negative.  Negative for cough, chest tightness, shortness of breath and wheezing.   Cardiovascular: Negative.  Negative for chest pain and palpitations.  Gastrointestinal:  Positive for abdominal distention and abdominal pain. Negative for blood in stool, constipation, diarrhea, nausea and vomiting.   Physical Exam Vitals reviewed.  Constitutional:      General: She is not in acute distress.    Appearance: Normal appearance. She is obese. She is ill-appearing.  HENT:     Head: Normocephalic and atraumatic.  Eyes:     Pupils: Pupils are equal, round, and reactive to light.  Cardiovascular:     Rate and Rhythm: Normal rate and regular rhythm.  Pulmonary:     Effort: Pulmonary effort is normal. No respiratory distress.  Abdominal:     General: Abdomen is protuberant. Bowel sounds are normal. There is distension.     Palpations: There is mass (firm mass over protuberant area to the right of the umbilicus that is reddened, bruised, tender and swollen).     Tenderness: There is generalized abdominal tenderness and tenderness in the right upper quadrant, right lower quadrant and periumbilical area. There is guarding.     Hernia: A hernia is present. Hernia is present in the umbilical area.       Comments: Possible abscess  Neurological:     Mental Status: She is alert and oriented to person, place, and time.  Psychiatric:        Mood and Affect: Mood normal.        Behavior: Behavior normal.      Assessment/Plan: 1. Abdominal wall abscess 2 weeks of doxycycline prescribed, patient instructed to take with food. Emergent referral for general surgery, will need surgical intervention most likely and possible intravenous antibiotics - doxycycline (VIBRA-TABS) 100 MG tablet; Take 1 tablet (100 mg total) by mouth 2 (two) times daily for 14 days.  Dispense: 28 tablet; Refill: 0 - Ambulatory  referral to General Surgery  2. History of abdominal hernia Emergent referral - Ambulatory referral to General Surgery  3. Essential hypertension Elevated BP, patient is in pain due to abdominal hernia/abscess  4. Vulvovaginal candidiasis Medication sent prophylactically due to increased incidence of yeast infection when taking antibiotics. - fluconazole (DIFLUCAN) 150 MG tablet; Take 1 tablet (150 mg total) by mouth once for 1 dose. May take an additional dose after 3  days if still symptomatic.  Dispense: 3 tablet; Refill: 0   General Counseling: Menaal verbalizes understanding of the findings of todays visit and agrees with plan of treatment. I have discussed any further diagnostic evaluation that may be needed or ordered today. We also reviewed her medications today. she has been encouraged to call the office with any questions or concerns that should arise related to todays visit.    Counseling:    Orders Placed This Encounter  Procedures   Ambulatory referral to General Surgery    Meds ordered this encounter  Medications   doxycycline (VIBRA-TABS) 100 MG tablet    Sig: Take 1 tablet (100 mg total) by mouth 2 (two) times daily for 14 days.    Dispense:  28 tablet    Refill:  0   fluconazole (DIFLUCAN) 150 MG tablet    Sig: Take 1 tablet (150 mg total) by mouth once for 1 dose. May take an additional dose after 3 days if still symptomatic.    Dispense:  3 tablet    Refill:  0    Patient will call pharmacy if she needs to fill this medication    Return in about 1 week (around 03/18/2022) for F/U, Maurina Fawaz PCP abdominal wall abscess, possible abd hernia, needs emergent ref to gen surg.  [place] Controlled Substance Database was reviewed by me for overdose risk score (ORS)  Time spent:20 Minutes Time spent with patient included reviewing progress notes, labs, imaging studies, and discussing plan for follow up.   This patient was seen by Jonetta Osgood, FNP-C in collaboration  with Dr. Clayborn Bigness as a part of collaborative care agreement.  Steffan Caniglia R. Valetta Fuller, MSN, FNP-C Internal Medicine

## 2022-03-12 ENCOUNTER — Other Ambulatory Visit
Admission: RE | Admit: 2022-03-12 | Discharge: 2022-03-12 | Disposition: A | Discharge status: Home / Self Care | Payer: Medicare Other | Source: Ambulatory Visit | Attending: Surgery | Admitting: Surgery

## 2022-03-12 ENCOUNTER — Encounter: Payer: Self-pay | Admitting: Surgery

## 2022-03-12 ENCOUNTER — Ambulatory Visit (INDEPENDENT_AMBULATORY_CARE_PROVIDER_SITE_OTHER): Payer: Medicare Other | Admitting: Surgery

## 2022-03-12 VITALS — BP 167/83 | HR 75 | Temp 98.2°F | Ht <= 58 in | Wt 174.0 lb

## 2022-03-12 DIAGNOSIS — R109 Unspecified abdominal pain: Secondary | ICD-10-CM

## 2022-03-12 DIAGNOSIS — Z6841 Body Mass Index (BMI) 40.0 and over, adult: Secondary | ICD-10-CM

## 2022-03-12 DIAGNOSIS — K439 Ventral hernia without obstruction or gangrene: Secondary | ICD-10-CM

## 2022-03-12 LAB — CBC WITH DIFFERENTIAL/PLATELET
Abs Immature Granulocytes: 0.02 10*3/uL (ref 0.00–0.07)
Basophils Absolute: 0 10*3/uL (ref 0.0–0.1)
Basophils Relative: 1 %
Eosinophils Absolute: 0.3 10*3/uL (ref 0.0–0.5)
Eosinophils Relative: 5 %
HCT: 37.7 % (ref 36.0–46.0)
Hemoglobin: 12.6 g/dL (ref 12.0–15.0)
Immature Granulocytes: 0 %
Lymphocytes Relative: 27 %
Lymphs Abs: 1.6 10*3/uL (ref 0.7–4.0)
MCH: 30.7 pg (ref 26.0–34.0)
MCHC: 33.4 g/dL (ref 30.0–36.0)
MCV: 91.7 fL (ref 80.0–100.0)
Monocytes Absolute: 0.4 10*3/uL (ref 0.1–1.0)
Monocytes Relative: 7 %
Neutro Abs: 3.7 10*3/uL (ref 1.7–7.7)
Neutrophils Relative %: 60 %
Platelets: 250 10*3/uL (ref 150–400)
RBC: 4.11 MIL/uL (ref 3.87–5.11)
RDW: 12.7 % (ref 11.5–15.5)
WBC: 6.1 10*3/uL (ref 4.0–10.5)
nRBC: 0 % (ref 0.0–0.2)

## 2022-03-12 LAB — COMPREHENSIVE METABOLIC PANEL
ALT: 12 U/L (ref 0–44)
AST: 22 U/L (ref 15–41)
Albumin: 3.7 g/dL (ref 3.5–5.0)
Alkaline Phosphatase: 91 U/L (ref 38–126)
Anion gap: 6 (ref 5–15)
BUN: 23 mg/dL (ref 8–23)
CO2: 25 mmol/L (ref 22–32)
Calcium: 8.9 mg/dL (ref 8.9–10.3)
Chloride: 109 mmol/L (ref 98–111)
Creatinine, Ser: 0.83 mg/dL (ref 0.44–1.00)
GFR, Estimated: >60 mL/min (ref >60)
Glucose, Bld: 104 mg/dL — ABNORMAL HIGH (ref 70–99)
Potassium: 3.5 mmol/L (ref 3.5–5.1)
Sodium: 140 mmol/L (ref 135–145)
Total Bilirubin: 0.3 mg/dL (ref 0.3–1.2)
Total Protein: 8.1 g/dL (ref 6.5–8.1)

## 2022-03-12 LAB — PHOSPHORUS: Phosphorus: 3.3 mg/dL (ref 2.5–4.6)

## 2022-03-12 LAB — PROTIME-INR
INR: 1 (ref 0.8–1.2)
Prothrombin Time: 13.1 seconds (ref 11.4–15.2)

## 2022-03-12 LAB — MAGNESIUM: Magnesium: 2.1 mg/dL (ref 1.7–2.4)

## 2022-03-12 NOTE — Patient Instructions (Addendum)
CT scan scheduled on  03/17/22 @ 12:45. Nothing to eat or drink 4 hours prior .  Please pick up your prep kit today at [facility].  [address] [place]   Please go directly to [facility] lab today.   Please see your follow up appointment listed below.   

## 2022-03-13 ENCOUNTER — Telehealth: Payer: Self-pay

## 2022-03-13 ENCOUNTER — Telehealth: Payer: Self-pay | Admitting: Surgery

## 2022-03-13 DIAGNOSIS — L02211 Cutaneous abscess of abdominal wall: Secondary | ICD-10-CM

## 2022-03-13 NOTE — Telephone Encounter (Signed)
Patient informed to call her primary as they were the ones to prescribe the antibiotic.

## 2022-03-13 NOTE — Telephone Encounter (Signed)
Patient was seen yesterday for abdominal abscess.  She was given Rx for antibiotics. She has taken two of them, when she woke up this morning had two fever blisters on the inside of her mouth.  Wants to know if there is anything else that can be called in for her.  No fever,chills, nausea or vomiting.  Please call her.   Thank you.

## 2022-03-14 ENCOUNTER — Telehealth: Payer: Self-pay

## 2022-03-14 ENCOUNTER — Other Ambulatory Visit: Payer: Self-pay

## 2022-03-14 ENCOUNTER — Other Ambulatory Visit: Payer: Self-pay | Admitting: Nurse Practitioner

## 2022-03-14 MED ORDER — NYSTATIN 100000 UNIT/ML MT SUSP: 5.0000 mL | Freq: Four times a day (QID) | OROMUCOSAL | 0 refills | Status: DC | PRN

## 2022-03-14 MED ORDER — LIDOCAINE VISCOUS HCL 2 % MT SOLN
5.0000 mL | Freq: Four times a day (QID) | OROMUCOSAL | 0 refills | Status: DC | PRN
Start: 2022-03-14 — End: 2022-03-14

## 2022-03-14 MED ORDER — CLINDAMYCIN HCL 150 MG PO CAPS: 450.0000 mg | ORAL_CAPSULE | Freq: Three times a day (TID) | ORAL | 0 refills | Status: AC

## 2022-03-14 NOTE — Telephone Encounter (Signed)
Pt advised we send magic mouth wash

## 2022-03-14 NOTE — Addendum Note (Signed)
Addended by: Jonetta Osgood on: 03/14/2022 01:30 PM   Modules accepted: Orders

## 2022-03-14 NOTE — Telephone Encounter (Signed)
Pt advised Send  clindamycin

## 2022-03-14 NOTE — Telephone Encounter (Signed)
Phar called for magic wash component as per alyssa gave them verbal also pres has this component  nystatin 100000 UNIT/ML Susp 60 mL 60 mL  lidocaine 2 % Soln 60 mL 60 mL  diphenhydrAMINE 12.5 MG/5ML Liqd 60 mL 60 mL

## 2022-03-16 ENCOUNTER — Encounter: Payer: Self-pay | Admitting: Surgery

## 2022-03-16 NOTE — Progress Notes (Signed)
Patient ID: Claudia Martin, female   DOB: July 13, 1946, 76 y.o.   MRN: [id number]  HPI Claudia Martin is a 76 y.o. female seen in consultation at the request of Ms. Abernathy NP.  Reports some swelling around the abdominal wall and pain the pain is mild intermittent and worsening with certain Valsalva.  He does notice some redness around it.  There is no specific alleviating factors.  She has multiple comorbidities including high BMI, CAD s/p DES stent 06/2021 on  Brilinta and ASA She did have a chest x-ray that have personally reviewed showing evidence of cardiomegaly. CMP was completely normal, CBC was normal as well Echo EF 55% mildLVH HPI  Past Medical History:  Diagnosis Date   Abdominal pain    Allergic rhinitis    Arthritis    Candidiasis of vulva and vagina    Cellulitis and abscess    Diverticulosis    Dysuria    Enlarged heart    Eyelid inflammation    GERD (gastroesophageal reflux disease)    Hematuria syndrome    Hemorrhoids    Herpes genitalis    Herpes simplex    HTN (hypertension)    Hydronephrosis    Hypothyroidism    Incomplete bladder emptying    Internal hordeolum    Knee pain    Otalgia    Primary ovarian failure    Sciatica    Shortness of breath    Sinus bradycardia    Sleep apnea    uses CPAP   Urethral diverticulum    UTI (lower urinary tract infection)    Vaginal atrophy    Vaginitis     Past Surgical History:  Procedure Laterality Date   BREAST BIOPSY     CATARACT EXTRACTION W/PHACO Right 06/05/2021   Procedure: CATARACT EXTRACTION PHACO AND INTRAOCULAR LENS PLACEMENT ([place]) RIGHT;  Surgeon: Leandrew Koyanagi, MD;  Location: [facility];  Service: Ophthalmology;  Laterality: Right;  3.02 00:39.5   CATARACT EXTRACTION W/PHACO Left 12/04/2021   Procedure: CATARACT EXTRACTION PHACO AND INTRAOCULAR LENS PLACEMENT ([place]) LEFT;  Surgeon: Leandrew Koyanagi, MD;  Location: [facility];  Service: Ophthalmology;   Laterality: Left;  6.23 00:48.0   COLON SURGERY     COLONOSCOPY WITH PROPOFOL N/A 03/15/2018   Procedure: COLONOSCOPY WITH PROPOFOL;  Surgeon: Lin Landsman, MD;  Location: [facility] ENDOSCOPY;  Service: Gastroenterology;  Laterality: N/A;   COLONOSCOPY WITH PROPOFOL N/A 01/09/2021   Procedure: COLONOSCOPY WITH PROPOFOL;  Surgeon: Lin Landsman, MD;  Location: [facility] ENDOSCOPY;  Service: Gastroenterology;  Laterality: N/A;   CORONARY/GRAFT ACUTE MI REVASCULARIZATION N/A 06/06/2021   Procedure: Coronary/Graft Acute MI Revascularization;  Surgeon: Yolonda Kida, MD;  Location: [facility];  Service: Cardiovascular;  Laterality: N/A;   diverticulitis     ECTOPIC PREGNANCY SURGERY     fx thumb     HERNIA REPAIR     LEFT HEART CATH AND CORONARY ANGIOGRAPHY N/A 06/06/2021   Procedure: LEFT HEART CATH AND CORONARY ANGIOGRAPHY;  Surgeon: Yolonda Kida, MD;  Location: [facility];  Service: Cardiovascular;  Laterality: N/A;    Family History  Problem Relation Age of Onset   Tuberculosis Mother    Nephrolithiasis Brother    Vision loss Daughter    Hypertension Daughter     Social History Social History   Tobacco Use   Smoking status: Never    Passive exposure: Never   Smokeless tobacco: Never  Vaping Use   Vaping Use: Never used  Substance Use Topics   Alcohol use: No   Drug use: Never    Allergies  Allergen Reactions   Other Diarrhea and Hives   Augmentin [Amoxicillin-Pot Clavulanate]    Bactrim [Sulfamethoxazole-Trimethoprim] Hives   Doxycycline    Ibuprofen     Other reaction(s): Unknown   Ivp Dye [Iodinated Contrast Media] Diarrhea and Hives   Macrobid [Nitrofurantoin]    Prednisone Nausea Only    Other reaction(s): Dizziness    Current Outpatient Medications  Medication Sig Dispense Refill   amLODipine (NORVASC) 2.5 MG tablet Take 2.5 mg by mouth 2 (two) times daily.     aspirin 81 MG chewable tablet Chew 1 tablet (81 mg total) by mouth  daily.     BRILINTA 90 MG TABS tablet Take 90 mg by mouth 2 (two) times daily.     cetirizine (ZYRTEC) 10 MG tablet Take 10 mg by mouth daily.     chlorhexidine (PERIDEX) 0.12 % solution USE 5 ML AS DIRECTED IN THE MOUTH OR THROAT TWICE DAILY 473 mL 1   conjugated estrogens (PREMARIN) vaginal cream Use 1 applicatorful twice weekly. (Patient taking differently: Place 1 Applicatorful vaginally 2 (two) times a week. Use just a little bit, not an applicatorful.) 90 g 4   EPINEPHrine 0.3 mg/0.3 mL IJ SOAJ injection as needed.     levothyroxine (SYNTHROID) 50 MCG tablet TAKE 1 TABLET(50 MCG) BY MOUTH DAILY BEFORE AND BREAKFAST 90 tablet 1   losartan (COZAAR) 25 MG tablet Take 1 tablet by mouth daily.     prednisoLONE acetate (PRED FORTE) 1 % ophthalmic suspension Place 1 drop into the right eye 4 (four) times daily.     rosuvastatin (CRESTOR) 5 MG tablet Take 1 tablet (5 mg total) by mouth daily. 90 tablet 3   valACYclovir (VALTREX) 1000 MG tablet TAKE 1 TABLET(1000 MG) BY MOUTH DAILY. CAN TAKE 1 TABLET TWICE DAILY IF FLARE UP FOR 7 DAYS 90 tablet 3   clindamycin (CLEOCIN) 150 MG capsule Take 3 capsules (450 mg total) by mouth 3 (three) times daily for 5 days. Take with food. 45 capsule 0   magic mouthwash (nystatin, lidocaine, diphenhydrAMINE) suspension Take 5 mLs by mouth 4 (four) times daily as needed for mouth pain. Swiss and spit 180 mL 0   metoprolol tartrate (LOPRESSOR) 25 MG tablet Take 1 tablet (25 mg total) by mouth 2 (two) times daily. 60 tablet 0   nitroGLYCERIN (NITROSTAT) 0.4 MG SL tablet Place 1 tablet (0.4 mg total) under the tongue every 5 (five) minutes x 3 doses as needed for chest pain. 30 tablet 0   No current facility-administered medications for this visit.     Review of Systems Full ROS  was asked and was negative except for the information on the HPI  Physical Exam Blood pressure (!) 167/83, pulse 75, temperature 98.2 F (36.8 C), height '4\' 7"'$  (1.397 m), weight 174 lb  (78.9 kg), SpO2 98 %. CONSTITUTIONAL: NAD. EYES: Pupils are equal, round, Sclera are non-icteric. EARS, NOSE, MOUTH AND THROAT: The oral mucosa is pink and moist. Hearing is intact to voice. LYMPH NODES:  Lymph nodes in the neck are normal. RESPIRATORY:  Lungs are clear. There is normal respiratory effort, with equal breath sounds bilaterally, and without pathologic use of accessory muscles. CARDIOVASCULAR: Heart is regular without murmurs, gallops, or rubs. GI: The abdomen is  soft,there is evidence of a diathesis recti and ventral hernia, difficult to measure the size of the defect. There ia additional induration. I  can not rule out incarceration of omentum vs mesh infection. No peritonitis GU: Rectal deferred.   MUSCULOSKELETAL: Normal muscle strength and tone. No cyanosis or edema.   SKIN: Turgor is good and there are no pathologic skin lesions or ulcers. NEUROLOGIC: Motor and sensation is grossly normal. Cranial nerves are grossly intact. PSYCH:  Oriented to person, place and time. Affect is normal.  Data Reviewed I have personally reviewed the patient's imaging, laboratory findings and medical records.    Assessment/Plan 76 year old female with multiple comorbidities high BMI evidence of recurrent ventral hernia with some inflammatory changes.  This might be very well mesh infection vs chronic incarceration.  We will start work-up with a CT scan of the abdomen and pelvis with contrast as well as full set of labs to include CMP, CBC and INR.  If this turns out to be an abdominal wall infection next to the mesh this is a very complex scenario.  Currently she does not seem to be toxic nor peritonitic.  I will see her next week with a short follow-up.  She is to continue current antibiotic therapy.   We will get cards eval since she is on dual antiplatelts and may have some CHF.Marland Kitchen Please note that I spent greater than 60 minutes in this encounter including coordination of her care, personally  reviewing records as well as personally reviewing imaging studies, placing orders and performing appropriate documentation   Caroleen Hamman, MD FACS General Surgeon 03/16/2022, 7:37 PM

## 2022-03-17 ENCOUNTER — Ambulatory Visit
Admission: RE | Admit: 2022-03-17 | Discharge: 2022-03-17 | Disposition: A | Discharge status: Home / Self Care | Payer: Medicare Other | Source: Ambulatory Visit | Attending: Surgery | Admitting: Surgery

## 2022-03-17 DIAGNOSIS — K429 Umbilical hernia without obstruction or gangrene: Secondary | ICD-10-CM | POA: Diagnosis not present

## 2022-03-17 DIAGNOSIS — R109 Unspecified abdominal pain: Secondary | ICD-10-CM | POA: Diagnosis not present

## 2022-03-17 MED ORDER — IOHEXOL 300 MG/ML  SOLN
100.0000 mL | Freq: Once | INTRAMUSCULAR | Status: AC | PRN
  Administered 2022-03-17: 100 mL via INTRAVENOUS

## 2022-03-17 MED ORDER — DIPHENHYDRAMINE HCL 50 MG PO CAPS
50.0000 mg | ORAL_CAPSULE | Freq: Once | ORAL | Status: DC
Start: 2022-03-17 — End: 2022-03-18

## 2022-03-17 MED ORDER — PREDNISONE 50 MG PO TABS: 50.0000 mg | ORAL_TABLET | Freq: Four times a day (QID) | ORAL | Status: DC

## 2022-03-17 MED ORDER — DIPHENHYDRAMINE HCL 50 MG/ML IJ SOLN: 50.0000 mg | Freq: Once | INTRAMUSCULAR | Status: DC

## 2022-03-19 ENCOUNTER — Telehealth: Payer: Self-pay

## 2022-03-19 ENCOUNTER — Encounter: Payer: Self-pay | Admitting: Surgery

## 2022-03-19 ENCOUNTER — Ambulatory Visit (INDEPENDENT_AMBULATORY_CARE_PROVIDER_SITE_OTHER): Payer: Medicare Other | Admitting: Surgery

## 2022-03-19 VITALS — BP 171/83 | HR 81 | Temp 98.7°F | Wt 173.4 lb

## 2022-03-19 DIAGNOSIS — K439 Ventral hernia without obstruction or gangrene: Secondary | ICD-10-CM | POA: Diagnosis not present

## 2022-03-19 DIAGNOSIS — K432 Incisional hernia without obstruction or gangrene: Secondary | ICD-10-CM

## 2022-03-19 NOTE — Patient Instructions (Addendum)
If you have any concerns or questions, please feel free to call our office See follow up appointment below.   Skin Abscess  A skin abscess is an infected area of your skin that contains pus and other material. An abscess can happen in any part of your body. Some abscesses break open (rupture) on their own. Most continue to get worse unless they are treated. The infection can spread deeper into the body and into your blood, which can make you feel sick. A skin abscess is caused by germs that enter the skin through a cut or scrape. It can also be caused by blocked oil and sweat glands or infected hair follicles. This condition is usually treated by: Draining the pus. Taking antibiotic medicines. Placing a warm, wet washcloth over the abscess. Follow these instructions at home: Medicines  Take over-the-counter and prescription medicines only as told by your doctor. If you were prescribed an antibiotic medicine, take it as told by your doctor. Do not stop taking the antibiotic even if you start to feel better. Abscess care  If you have an abscess that has not drained, place a warm, clean, wet washcloth over the abscess several times a day. Do this as told by your doctor. Follow instructions from your doctor about how to take care of your abscess. Make sure you: Cover the abscess with a bandage (dressing). Change your bandage or gauze as told by your doctor. Wash your hands with soap and water before you change the bandage or gauze. If you cannot use soap and water, use hand sanitizer. Check your abscess every day for signs that the infection is getting worse. Check for: More redness, swelling, or pain. More fluid or blood. Warmth. More pus or a bad smell. General instructions To avoid spreading the infection: Do not share personal care items, towels, or hot tubs with others. Avoid making skin-to-skin contact with other people. Keep all follow-up visits as told by your doctor. This is  important. Contact a doctor if: You have more redness, swelling, or pain around your abscess. You have more fluid or blood coming from your abscess. Your abscess feels warm when you touch it. You have more pus or a bad smell coming from your abscess. Your muscles ache. You feel sick. Get help right away if: You have very bad (severe) pain. You see red streaks on your skin spreading away from the abscess. You see redness that spreads quickly. You have a fever or chills. Summary A skin abscess is an infected area of your skin that contains pus and other material. The abscess is caused by germs that enter the skin through a cut or scrape. It can also be caused by blocked oil and sweat glands or infected hair follicles. Follow your doctor's instructions on caring for your abscess, taking medicines, preventing infections, and keeping follow-up visits. This information is not intended to replace advice given to you by your health care provider. Make sure you discuss any questions you have with your health care provider. Document Revised: 07/01/2021 Document Reviewed: 07/01/2021 Elsevier Patient Education  [employer].

## 2022-03-19 NOTE — Telephone Encounter (Signed)
Faxed cardiac clearance to Dr. Clayborn Bigness at [phone].

## 2022-03-20 DIAGNOSIS — J3089 Other allergic rhinitis: Secondary | ICD-10-CM | POA: Diagnosis not present

## 2022-03-20 DIAGNOSIS — J3081 Allergic rhinitis due to animal (cat) (dog) hair and dander: Secondary | ICD-10-CM | POA: Diagnosis not present

## 2022-03-20 DIAGNOSIS — J301 Allergic rhinitis due to pollen: Secondary | ICD-10-CM | POA: Diagnosis not present

## 2022-03-21 ENCOUNTER — Ambulatory Visit (INDEPENDENT_AMBULATORY_CARE_PROVIDER_SITE_OTHER): Payer: Medicare Other | Admitting: Nurse Practitioner

## 2022-03-21 ENCOUNTER — Encounter: Payer: Self-pay | Admitting: Nurse Practitioner

## 2022-03-21 VITALS — BP 145/75 | HR 75 | Temp 98.1°F | Resp 16 | Ht <= 58 in | Wt 174.6 lb

## 2022-03-21 DIAGNOSIS — K458 Other specified abdominal hernia without obstruction or gangrene: Secondary | ICD-10-CM

## 2022-03-21 DIAGNOSIS — Z8719 Personal history of other diseases of the digestive system: Secondary | ICD-10-CM

## 2022-03-21 NOTE — Progress Notes (Signed)
Outpatient Surgical Follow Up  03/21/2022  Claudia Martin is an 76 y.o. female.   Chief Complaint  Patient presents with   Follow-up    Abd abscess    HPI: f/u for recurrent ventral hernia and abdominal wall infection vs mesh infection. Completed antibiotic treatment and abdominal wall seems to have improved. She obviously still has the hernia w large defect. Reports some swelling around the abdominal wall and pain the pain is mild intermittent and worsening with certain Valsalva.  He does notice some redness around it.  There is no specific alleviating factors.  She has multiple comorbidities including high BMI, CAD s/p DES stent 06/2021 on  Brilinta and ASA She did have a  CT  that  I have ordered and  that I have personally reviewed showing evidence of large ventral hernia w a nodule anterior to the fasci and to the right. No evidence of bowel incarceration, no abscess, still can not exclude mesh inffection vs EIC that is infected..   Past Medical History:  Diagnosis Date   Abdominal pain    Allergic rhinitis    Arthritis    Candidiasis of vulva and vagina    Cellulitis and abscess    Diverticulosis    Dysuria    Enlarged heart    Eyelid inflammation    GERD (gastroesophageal reflux disease)    Hematuria syndrome    Hemorrhoids    Herpes genitalis    Herpes simplex    HTN (hypertension)    Hydronephrosis    Hypothyroidism    Incomplete bladder emptying    Internal hordeolum    Knee pain    Otalgia    Primary ovarian failure    Sciatica    Shortness of breath    Sinus bradycardia    Sleep apnea    uses CPAP   Urethral diverticulum    UTI (lower urinary tract infection)    Vaginal atrophy    Vaginitis     Past Surgical History:  Procedure Laterality Date   BREAST BIOPSY     CATARACT EXTRACTION W/PHACO Right 06/05/2021   Procedure: CATARACT EXTRACTION PHACO AND INTRAOCULAR LENS PLACEMENT ([place]) RIGHT;  Surgeon: Leandrew Koyanagi, MD;  Location: [place];  Service: Ophthalmology;  Laterality: Right;  3.02 00:39.5   CATARACT EXTRACTION W/PHACO Left 12/04/2021   Procedure: CATARACT EXTRACTION PHACO AND INTRAOCULAR LENS PLACEMENT ([place]) LEFT;  Surgeon: Leandrew Koyanagi, MD;  Location: [place];  Service: Ophthalmology;  Laterality: Left;  6.23 00:48.0   COLON SURGERY     COLONOSCOPY WITH PROPOFOL N/A 03/15/2018   Procedure: COLONOSCOPY WITH PROPOFOL;  Surgeon: Lin Landsman, MD;  Location: [facility] ENDOSCOPY;  Service: Gastroenterology;  Laterality: N/A;   COLONOSCOPY WITH PROPOFOL N/A 01/09/2021   Procedure: COLONOSCOPY WITH PROPOFOL;  Surgeon: Lin Landsman, MD;  Location: [facility] ENDOSCOPY;  Service: Gastroenterology;  Laterality: N/A;   CORONARY/GRAFT ACUTE MI REVASCULARIZATION N/A 06/06/2021   Procedure: Coronary/Graft Acute MI Revascularization;  Surgeon: Yolonda Kida, MD;  Location: [place] CV LAB;  Service: Cardiovascular;  Laterality: N/A;   diverticulitis     ECTOPIC PREGNANCY SURGERY     fx thumb     HERNIA REPAIR     LEFT HEART CATH AND CORONARY ANGIOGRAPHY N/A 06/06/2021   Procedure: LEFT HEART CATH AND CORONARY ANGIOGRAPHY;  Surgeon: Yolonda Kida, MD;  Location: [place] CV LAB;  Service: Cardiovascular;  Laterality: N/A;    Family History  Problem Relation Age of Onset  Tuberculosis Mother    Nephrolithiasis Brother    Vision loss Daughter    Hypertension Daughter     Social History:  reports that she has never smoked. She has never been exposed to tobacco smoke. She has never used smokeless tobacco. She reports that she does not drink alcohol and does not use drugs.  Allergies:  Allergies  Allergen Reactions   Other Diarrhea and Hives   Augmentin [Amoxicillin-Pot Clavulanate]    Bactrim [Sulfamethoxazole-Trimethoprim] Hives   Doxycycline    Ibuprofen     Other reaction(s): Unknown   Ivp Dye [Iodinated Contrast Media] Diarrhea and Hives   Macrobid [Nitrofurantoin]     Prednisone Nausea Only    Other reaction(s): Dizziness    Medications reviewed.    ROS Full ROS performed and is otherwise negative other than what is stated in HPI   BP (!) 171/83   Pulse 81   Temp 98.7 F (37.1 C) (Oral)   Wt 173 lb 6.4 oz (78.7 kg)   SpO2 96%   BMI 40.30 kg/m   Physical Exam CONSTITUTIONAL: NAD. EYES: Pupils are equal, round, Sclera are non-icteric. EARS, NOSE, MOUTH AND THROAT: The oral mucosa is pink and moist. Hearing is intact to voice. LYMPH NODES:  Lymph nodes in the neck are normal. RESPIRATORY:  Lungs are clear. There is normal respiratory effort, with equal breath sounds bilaterally, and without pathologic use of accessory muscles. CARDIOVASCULAR: Heart is regular without murmurs, gallops, or rubs. GI: The abdomen is  soft,there is evidence of a diathesis recti and ventral hernia that is alrge, difficult to measure the size of the defect. There ia additional induration to the right of the midline. I can not rule out potential infected EIC vs mesh infection. No peritonitis. No abscess GU: Rectal.  deferred.   MUSCULOSKELETAL: Normal muscle strength and tone. No cyanosis or edema.   SKIN: Turgor is good and there are no pathologic skin lesions or ulcers. NEUROLOGIC: Motor and sensation is grossly normal. Cranial nerves are grossly intact. PSYCH:  Oriented to person, place and time. Affect is normal.  Assessment/Plan:  1. Recurrent ventral hernia. She does have some loss of domain and significant risk factors to include antiplatelet rx. I would like to wait to get her medically optimized before performing any surgicall intervention. I would like to reassess her induration ( it might be an EIC next to the hernia vs mesh infection.  I do thin regardless we will need to be prepare for potential mesh excision during definitive operation. I will see her in another visit to check the status of her abd wall. Please note that I spent 40 minutes in this  encounter including coordination of her care, personally reviewing records as well as personally reviewing imaging studies, placing orders and performing appropriate documentation   Caroleen Hamman, MD [facility] General Surgeon

## 2022-03-21 NOTE — Progress Notes (Signed)
Cardiology Clearance has been received from Dr Clayborn Bigness. The patient is cleared at low risk.

## 2022-03-21 NOTE — Progress Notes (Unsigned)
[facility] [place], [place] [address]  Internal MEDICINE  Office Visit Note  Patient Name: Claudia Martin  [id number]  [id number]  Date of Service: 03/21/2022  Chief Complaint  Patient presents with   Follow-up    Abd wall mass, cyst above hernia in abd    Gastroesophageal Reflux   Hypertension    HPI Claudia Martin presents for follow-up visit for abdominal wall hernia.  She has had the hernia surgically repaired before it has appeared again.  She is seeing the general surgeon and the plan is to have surgery to remove the cyst that was found in the abdomen as well as repair the hernia She has a couple more days of the antibiotic.  She is not having any adverse side effects related to the antibiotic   Current Medication: Outpatient Encounter Medications as of 03/21/2022  Medication Sig Note   amLODipine (NORVASC) 2.5 MG tablet Take 2.5 mg by mouth 2 (two) times daily.    aspirin 81 MG chewable tablet Chew 1 tablet (81 mg total) by mouth daily.    BRILINTA 90 MG TABS tablet Take 90 mg by mouth 2 (two) times daily.    cetirizine (ZYRTEC) 10 MG tablet Take 10 mg by mouth daily.    chlorhexidine (PERIDEX) 0.12 % solution USE 5 ML AS DIRECTED IN THE MOUTH OR THROAT TWICE DAILY    clindamycin (CLEOCIN) 150 MG capsule Take by mouth 3 (three) times daily.    conjugated estrogens (PREMARIN) vaginal cream Use 1 applicatorful twice weekly. (Patient taking differently: Place 1 Applicatorful vaginally 2 (two) times a week. Use just a little bit, not an applicatorful.)    EPINEPHrine 0.3 mg/0.3 mL IJ SOAJ injection as needed. 07/16/2015: Received from: [facility]   levothyroxine (SYNTHROID) 50 MCG tablet TAKE 1 TABLET(50 MCG) BY MOUTH DAILY BEFORE AND BREAKFAST    losartan (COZAAR) 25 MG tablet Take 1 tablet by mouth daily.    magic mouthwash (nystatin, lidocaine, diphenhydrAMINE) suspension Take 5 mLs by mouth 4 (four) times daily as needed for mouth  pain. Swiss and spit    prednisoLONE acetate (PRED FORTE) 1 % ophthalmic suspension Place 1 drop into the right eye 4 (four) times daily.    rosuvastatin (CRESTOR) 5 MG tablet Take 1 tablet (5 mg total) by mouth daily.    valACYclovir (VALTREX) 1000 MG tablet TAKE 1 TABLET(1000 MG) BY MOUTH DAILY. CAN TAKE 1 TABLET TWICE DAILY IF FLARE UP FOR 7 DAYS    metoprolol tartrate (LOPRESSOR) 25 MG tablet Take 1 tablet (25 mg total) by mouth 2 (two) times daily.    nitroGLYCERIN (NITROSTAT) 0.4 MG SL tablet Place 1 tablet (0.4 mg total) under the tongue every 5 (five) minutes x 3 doses as needed for chest pain. 12/04/2021: Never used    No facility-administered encounter medications on file as of 03/21/2022.    Surgical History: Past Surgical History:  Procedure Laterality Date   BREAST BIOPSY     CATARACT EXTRACTION W/PHACO Right 06/05/2021   Procedure: CATARACT EXTRACTION PHACO AND INTRAOCULAR LENS PLACEMENT ([place]) RIGHT;  Surgeon: Leandrew Koyanagi, MD;  Location: [place];  Service: Ophthalmology;  Laterality: Right;  3.02 00:39.5   CATARACT EXTRACTION W/PHACO Left 12/04/2021   Procedure: CATARACT EXTRACTION PHACO AND INTRAOCULAR LENS PLACEMENT ([place]) LEFT;  Surgeon: Leandrew Koyanagi, MD;  Location: [place];  Service: Ophthalmology;  Laterality: Left;  6.23 00:48.0   COLON SURGERY     COLONOSCOPY WITH PROPOFOL N/A  03/15/2018   Procedure: COLONOSCOPY WITH PROPOFOL;  Surgeon: Lin Landsman, MD;  Location: [facility] ENDOSCOPY;  Service: Gastroenterology;  Laterality: N/A;   COLONOSCOPY WITH PROPOFOL N/A 01/09/2021   Procedure: COLONOSCOPY WITH PROPOFOL;  Surgeon: Lin Landsman, MD;  Location: [facility] ENDOSCOPY;  Service: Gastroenterology;  Laterality: N/A;   CORONARY/GRAFT ACUTE MI REVASCULARIZATION N/A 06/06/2021   Procedure: Coronary/Graft Acute MI Revascularization;  Surgeon: Yolonda Kida, MD;  Location: [place] CV LAB;  Service: Cardiovascular;  Laterality:  N/A;   diverticulitis     ECTOPIC PREGNANCY SURGERY     fx thumb     HERNIA REPAIR     LEFT HEART CATH AND CORONARY ANGIOGRAPHY N/A 06/06/2021   Procedure: LEFT HEART CATH AND CORONARY ANGIOGRAPHY;  Surgeon: Yolonda Kida, MD;  Location: [place] CV LAB;  Service: Cardiovascular;  Laterality: N/A;    Medical History: Past Medical History:  Diagnosis Date   Abdominal pain    Allergic rhinitis    Arthritis    Candidiasis of vulva and vagina    Cellulitis and abscess    Diverticulosis    Dysuria    Enlarged heart    Eyelid inflammation    GERD (gastroesophageal reflux disease)    Hematuria syndrome    Hemorrhoids    Herpes genitalis    Herpes simplex    HTN (hypertension)    Hydronephrosis    Hypothyroidism    Incomplete bladder emptying    Internal hordeolum    Knee pain    Otalgia    Primary ovarian failure    Sciatica    Shortness of breath    Sinus bradycardia    Sleep apnea    uses CPAP   Urethral diverticulum    UTI (lower urinary tract infection)    Vaginal atrophy    Vaginitis     Family History: Family History  Problem Relation Age of Onset   Tuberculosis Mother    Nephrolithiasis Brother    Vision loss Daughter    Hypertension Daughter     Social History   Socioeconomic History   Marital status: Divorced    Spouse name: Not on file   Number of children: Not on file   Years of education: Not on file   Highest education level: Not on file  Occupational History   Not on file  Tobacco Use   Smoking status: Never    Passive exposure: Never   Smokeless tobacco: Never  Vaping Use   Vaping Use: Never used  Substance and Sexual Activity   Alcohol use: No   Drug use: Never   Sexual activity: Not on file  Other Topics Concern   Not on file  Social History Narrative   Not on file   Social Determinants of Health   Financial Resource Strain: Not on file  Food Insecurity: Not on file  Transportation Needs: Not on file  Physical  Activity: Not on file  Stress: Not on file  Social Connections: Not on file  Intimate Partner Violence: Not on file      Review of Systems  Constitutional:  Positive for appetite change.  Respiratory: Negative.  Negative for cough, chest tightness, shortness of breath and wheezing.   Cardiovascular: Negative.  Negative for chest pain and palpitations.  Gastrointestinal:  Positive for abdominal distention and abdominal pain. Negative for blood in stool, constipation, diarrhea, nausea and vomiting.    Vital Signs: BP (!) 145/75   Pulse 75   Temp 98.1 F (36.7  C)   Resp 16   Ht '4\' 8"'$  (1.422 m)   Wt 174 lb 9.6 oz (79.2 kg)   SpO2 97%   BMI 39.14 kg/m    Physical Exam Vitals reviewed.  Constitutional:      General: She is not in acute distress.    Appearance: Normal appearance. She is obese. She is ill-appearing.  HENT:     Head: Normocephalic and atraumatic.  Eyes:     Pupils: Pupils are equal, round, and reactive to light.  Cardiovascular:     Rate and Rhythm: Normal rate and regular rhythm.  Pulmonary:     Effort: Pulmonary effort is normal. No respiratory distress.  Abdominal:     General: Abdomen is protuberant. Bowel sounds are normal. There is distension.     Palpations: There is mass (firm mass over protuberant area to the right of the umbilicus that is reddened, bruised, tender and swollen).     Tenderness: There is generalized abdominal tenderness and tenderness in the right upper quadrant, right lower quadrant and periumbilical area. There is guarding.     Hernia: A hernia is present. Hernia is present in the umbilical area.       Comments: Possible abscess  Neurological:     Mental Status: She is alert and oriented to person, place, and time.  Psychiatric:        Mood and Affect: Mood normal.        Behavior: Behavior normal.        Assessment/Plan: 1. Recurrent abdominal hernia without obstruction or gangrene, unspecified hernia type Waiting for  surgery to repair hernia and remove cyst. Needs surgical clearance and to know when to stop brillinta before surgery and when to restart after surgery  2. History of abdominal hernia Prior surgical repair x2    General Counseling: Claudia Martin understanding of the findings of todays visit and agrees with plan of treatment. I have discussed any further diagnostic evaluation that may be needed or ordered today. We also reviewed her medications today. she has been encouraged to call the office with any questions or concerns that should arise related to todays visit.    No orders of the defined types were placed in this encounter.   No orders of the defined types were placed in this encounter.   Return in about 3 months (around 06/21/2022) for F/U, med refill, Claudia Martin PCP.   Total time spent:20 Minutes Time spent includes review of chart, medications, test results, and follow up plan with the patient.   [place] Controlled Substance Database was reviewed by me.  This patient was seen by Claudia Osgood, FNP-C in collaboration with Dr. Clayborn Martin as a part of collaborative care agreement.   Claudia Martin R. Valetta Fuller, MSN, FNP-C Internal medicine

## 2022-03-22 ENCOUNTER — Encounter: Payer: Self-pay | Admitting: Nurse Practitioner

## 2022-03-24 DIAGNOSIS — G4733 Obstructive sleep apnea (adult) (pediatric): Secondary | ICD-10-CM | POA: Diagnosis not present

## 2022-03-25 DIAGNOSIS — J3089 Other allergic rhinitis: Secondary | ICD-10-CM | POA: Diagnosis not present

## 2022-03-25 DIAGNOSIS — J3081 Allergic rhinitis due to animal (cat) (dog) hair and dander: Secondary | ICD-10-CM | POA: Diagnosis not present

## 2022-03-25 DIAGNOSIS — J301 Allergic rhinitis due to pollen: Secondary | ICD-10-CM | POA: Diagnosis not present

## 2022-03-26 ENCOUNTER — Ambulatory Visit (INDEPENDENT_AMBULATORY_CARE_PROVIDER_SITE_OTHER): Payer: Medicare Other

## 2022-03-26 DIAGNOSIS — G4733 Obstructive sleep apnea (adult) (pediatric): Secondary | ICD-10-CM | POA: Diagnosis not present

## 2022-03-26 NOTE — Progress Notes (Signed)
95 percentile pressure 9.9   95th percentile leak 0.6   apnea index 1.3 /hr  apnea-hypopnea index  2.0 /hr   total days used  >4 hr 90 days  total days used <4 hr 0 days  Total compliance 100 percent  She is doing great and with pressure 5-.10 her AHI back down to 2.0. no other problems or questions at this time. Pt was seen by Claiborne Billings  RRT/RCP  from [facility]

## 2022-04-02 ENCOUNTER — Telehealth: Payer: Medicare Other

## 2022-04-03 DIAGNOSIS — J3089 Other allergic rhinitis: Secondary | ICD-10-CM | POA: Diagnosis not present

## 2022-04-03 DIAGNOSIS — J3081 Allergic rhinitis due to animal (cat) (dog) hair and dander: Secondary | ICD-10-CM | POA: Diagnosis not present

## 2022-04-03 DIAGNOSIS — J301 Allergic rhinitis due to pollen: Secondary | ICD-10-CM | POA: Diagnosis not present

## 2022-04-10 ENCOUNTER — Encounter: Payer: Self-pay | Admitting: Physician Assistant

## 2022-04-10 ENCOUNTER — Ambulatory Visit (INDEPENDENT_AMBULATORY_CARE_PROVIDER_SITE_OTHER): Payer: Medicare Other | Admitting: Physician Assistant

## 2022-04-10 VITALS — BP 148/82 | HR 77 | Temp 97.6°F | Resp 16 | Ht <= 58 in | Wt 175.6 lb

## 2022-04-10 DIAGNOSIS — G4733 Obstructive sleep apnea (adult) (pediatric): Secondary | ICD-10-CM | POA: Diagnosis not present

## 2022-04-10 DIAGNOSIS — Z7189 Other specified counseling: Secondary | ICD-10-CM

## 2022-04-10 DIAGNOSIS — J301 Allergic rhinitis due to pollen: Secondary | ICD-10-CM | POA: Diagnosis not present

## 2022-04-10 DIAGNOSIS — J3089 Other allergic rhinitis: Secondary | ICD-10-CM | POA: Diagnosis not present

## 2022-04-10 DIAGNOSIS — I10 Essential (primary) hypertension: Secondary | ICD-10-CM | POA: Diagnosis not present

## 2022-04-10 DIAGNOSIS — E669 Obesity, unspecified: Secondary | ICD-10-CM | POA: Diagnosis not present

## 2022-04-10 DIAGNOSIS — J3081 Allergic rhinitis due to animal (cat) (dog) hair and dander: Secondary | ICD-10-CM | POA: Diagnosis not present

## 2022-04-10 NOTE — Patient Instructions (Signed)

## 2022-04-10 NOTE — Progress Notes (Signed)
[facility] [place], [place] [address]  Pulmonary Sleep Medicine   Office Visit Note  Patient Name: Claudia [place] DOB: 1946/02/20 MRN [id number]  Date of Service: 04/10/2022  Complaints/HPI: Pt is here for routine pulmonary follow up for OSA on CPAP. Denies any SOB, dryness, or headaches. Is on APAP 5-10cm H2O now and doing well on this. Download reviewed showing 100% compliance and AHI controlled at 2.0/hr. She is cleaning her machine and changing supplies regularly. Does mention she has an appt on Monday with surgery to discuss hernia and cyst management.  ROS  General: (-) fever, (-) chills, (-) night sweats, (-) weakness Skin: (-) rashes, (-) itching,. Eyes: (-) visual changes, (-) redness, (-) itching. Nose and Sinuses: (-) nasal stuffiness or itchiness, (-) postnasal drip, (-) nosebleeds, (-) sinus trouble. Mouth and Throat: (-) sore throat, (-) hoarseness. Neck: (-) swollen glands, (-) enlarged thyroid, (-) neck pain. Respiratory: - cough, (-) bloody sputum, - shortness of breath, - wheezing. Cardiovascular: - ankle swelling, (-) chest pain. Lymphatic: (-) lymph node enlargement. Neurologic: (-) numbness, (-) tingling. Psychiatric: (-) anxiety, (-) depression   Current Medication: Outpatient Encounter Medications as of 04/10/2022  Medication Sig Note   amLODipine (NORVASC) 2.5 MG tablet Take 2.5 mg by mouth 2 (two) times daily.    aspirin 81 MG chewable tablet Chew 1 tablet (81 mg total) by mouth daily.    BRILINTA 90 MG TABS tablet Take 90 mg by mouth 2 (two) times daily.    cetirizine (ZYRTEC) 10 MG tablet Take 10 mg by mouth daily.    chlorhexidine (PERIDEX) 0.12 % solution USE 5 ML AS DIRECTED IN THE MOUTH OR THROAT TWICE DAILY    clindamycin (CLEOCIN) 150 MG capsule Take by mouth 3 (three) times daily.    conjugated estrogens (PREMARIN) vaginal cream Use 1 applicatorful twice weekly. (Patient taking differently: Place 1 Applicatorful  vaginally 2 (two) times a week. Use just a little bit, not an applicatorful.)    EPINEPHrine 0.3 mg/0.3 mL IJ SOAJ injection as needed. 07/16/2015: Received from: [place]   levothyroxine (SYNTHROID) 50 MCG tablet TAKE 1 TABLET(50 MCG) BY MOUTH DAILY BEFORE AND BREAKFAST    losartan (COZAAR) 25 MG tablet Take 1 tablet by mouth daily.    magic mouthwash (nystatin, lidocaine, diphenhydrAMINE) suspension Take 5 mLs by mouth 4 (four) times daily as needed for mouth pain. Swiss and spit    prednisoLONE acetate (PRED FORTE) 1 % ophthalmic suspension Place 1 drop into the right eye 4 (four) times daily.    rosuvastatin (CRESTOR) 5 MG tablet Take 1 tablet (5 mg total) by mouth daily.    valACYclovir (VALTREX) 1000 MG tablet TAKE 1 TABLET(1000 MG) BY MOUTH DAILY. CAN TAKE 1 TABLET TWICE DAILY IF FLARE UP FOR 7 DAYS    metoprolol tartrate (LOPRESSOR) 25 MG tablet Take 1 tablet (25 mg total) by mouth 2 (two) times daily.    nitroGLYCERIN (NITROSTAT) 0.4 MG SL tablet Place 1 tablet (0.4 mg total) under the tongue every 5 (five) minutes x 3 doses as needed for chest pain. 12/04/2021: Never used    No facility-administered encounter medications on file as of 04/10/2022.    Surgical History: Past Surgical History:  Procedure Laterality Date   BREAST BIOPSY     CATARACT EXTRACTION W/PHACO Right 06/05/2021   Procedure: CATARACT EXTRACTION PHACO AND INTRAOCULAR LENS PLACEMENT ([place]) RIGHT;  Surgeon: Leandrew Koyanagi, MD;  Location: [place];  Service: Ophthalmology;  Laterality: Right;  3.02 00:39.5   CATARACT EXTRACTION W/PHACO Left 12/04/2021   Procedure: CATARACT EXTRACTION PHACO AND INTRAOCULAR LENS PLACEMENT ([place]) LEFT;  Surgeon: Leandrew Koyanagi, MD;  Location: [place];  Service: Ophthalmology;  Laterality: Left;  6.23 00:48.0   COLON SURGERY     COLONOSCOPY WITH PROPOFOL N/A 03/15/2018   Procedure: COLONOSCOPY WITH PROPOFOL;  Surgeon: Lin Landsman,  MD;  Location: [facility] ENDOSCOPY;  Service: Gastroenterology;  Laterality: N/A;   COLONOSCOPY WITH PROPOFOL N/A 01/09/2021   Procedure: COLONOSCOPY WITH PROPOFOL;  Surgeon: Lin Landsman, MD;  Location: [facility] ENDOSCOPY;  Service: Gastroenterology;  Laterality: N/A;   CORONARY/GRAFT ACUTE MI REVASCULARIZATION N/A 06/06/2021   Procedure: Coronary/Graft Acute MI Revascularization;  Surgeon: Yolonda Kida, MD;  Location: [place] CV LAB;  Service: Cardiovascular;  Laterality: N/A;   diverticulitis     ECTOPIC PREGNANCY SURGERY     fx thumb     HERNIA REPAIR     LEFT HEART CATH AND CORONARY ANGIOGRAPHY N/A 06/06/2021   Procedure: LEFT HEART CATH AND CORONARY ANGIOGRAPHY;  Surgeon: Yolonda Kida, MD;  Location: [place] CV LAB;  Service: Cardiovascular;  Laterality: N/A;    Medical History: Past Medical History:  Diagnosis Date   Abdominal pain    Allergic rhinitis    Arthritis    Candidiasis of vulva and vagina    Cellulitis and abscess    Diverticulosis    Dysuria    Enlarged heart    Eyelid inflammation    GERD (gastroesophageal reflux disease)    Hematuria syndrome    Hemorrhoids    Herpes genitalis    Herpes simplex    HTN (hypertension)    Hydronephrosis    Hypothyroidism    Incomplete bladder emptying    Internal hordeolum    Knee pain    Otalgia    Primary ovarian failure    Sciatica    Shortness of breath    Sinus bradycardia    Sleep apnea    uses CPAP   Urethral diverticulum    UTI (lower urinary tract infection)    Vaginal atrophy    Vaginitis     Family History: Family History  Problem Relation Age of Onset   Tuberculosis Mother    Nephrolithiasis Brother    Vision loss Daughter    Hypertension Daughter     Social History: Social History   Socioeconomic History   Marital status: Divorced    Spouse name: Not on file   Number of children: Not on file   Years of education: Not on file   Highest education level: Not on file   Occupational History   Not on file  Tobacco Use   Smoking status: Never    Passive exposure: Never   Smokeless tobacco: Never  Vaping Use   Vaping Use: Never used  Substance and Sexual Activity   Alcohol use: No   Drug use: Never   Sexual activity: Not on file  Other Topics Concern   Not on file  Social History Narrative   Not on file   Social Determinants of Health   Financial Resource Strain: Not on file  Food Insecurity: Not on file  Transportation Needs: Not on file  Physical Activity: Not on file  Stress: Not on file  Social Connections: Not on file  Intimate Partner Violence: Not on file    Vital Signs: Blood pressure (!) 148/82, pulse 77, temperature 97.6 F (36.4 C), resp. rate 16, height 4'  8" (1.422 m), weight 175 lb 9.6 oz (79.7 kg), SpO2 98 %.  Examination: General Appearance: The patient is well-developed, well-nourished, and in no distress. Skin: Gross inspection of skin unremarkable. Head: normocephalic, no gross deformities. Eyes: no gross deformities noted. ENT: ears appear grossly normal no exudates. Neck: Supple. No thyromegaly. No LAD. Respiratory: Lungs clear to auscultation bilaterally. Cardiovascular: Normal S1 and S2 without murmur or rub. Extremities: No cyanosis. pulses are equal. Neurologic: Alert and oriented. No involuntary movements.  LABS: Recent Results (from the past 2160 hour(s))  Protime-INR     Status: None   Collection Time: 03/12/22 11:43 AM  Result Value Ref Range   Prothrombin Time 13.1 11.4 - 15.2 seconds   INR 1.0 0.8 - 1.2    Comment: (NOTE) INR goal varies based on device and disease states. Performed at [facility], [address] [place], [place] [address]   Phosphorus     Status: None   Collection Time: 03/12/22 11:43 AM  Result Value Ref Range   Phosphorus 3.3 2.5 - 4.6 mg/dL    Comment: Performed at [facility], [address] [place], [place] [address]  Magnesium     Status: None    Collection Time: 03/12/22 11:43 AM  Result Value Ref Range   Magnesium 2.1 1.7 - 2.4 mg/dL    Comment: Performed at [facility], [address] [place], [place] [address]  Comprehensive metabolic panel     Status: Abnormal   Collection Time: 03/12/22 11:43 AM  Result Value Ref Range   Sodium 140 135 - 145 mmol/L   Potassium 3.5 3.5 - 5.1 mmol/L   Chloride 109 98 - 111 mmol/L   CO2 25 22 - 32 mmol/L   Glucose, Bld 104 (H) 70 - 99 mg/dL    Comment: Glucose reference range applies only to samples taken after fasting for at least 8 hours.   BUN 23 8 - 23 mg/dL   Creatinine, Ser 0.83 0.44 - 1.00 mg/dL   Calcium 8.9 8.9 - 10.3 mg/dL   Total Protein 8.1 6.5 - 8.1 g/dL   Albumin 3.7 3.5 - 5.0 g/dL   AST 22 15 - 41 U/L   ALT 12 0 - 44 U/L   Alkaline Phosphatase 91 38 - 126 U/L   Total Bilirubin 0.3 0.3 - 1.2 mg/dL   GFR, Estimated >60 >60 mL/min    Comment: (NOTE) Calculated using the CKD-EPI Creatinine Equation (2021)    Anion gap 6 5 - 15    Comment: Performed at [facility], [address] [place],  [address]  CBC with Differential/Platelet     Status: None   Collection Time: 03/12/22 11:43 AM  Result Value Ref Range   WBC 6.1 4.0 - 10.5 K/uL   RBC 4.11 3.87 - 5.11 MIL/uL   Hemoglobin 12.6 12.0 - 15.0 g/dL   HCT 37.7 36.0 - 46.0 %   MCV 91.7 80.0 - 100.0 fL   MCH 30.7 26.0 - 34.0 pg   MCHC 33.4 30.0 - 36.0 g/dL   RDW 12.7 11.5 - 15.5 %   Platelets 250 150 - 400 K/uL   nRBC 0.0 0.0 - 0.2 %   Neutrophils Relative % 60 %   Neutro Abs 3.7 1.7 - 7.7 K/uL   Lymphocytes Relative 27 %   Lymphs Abs 1.6 0.7 - 4.0 K/uL   Monocytes Relative 7 %   Monocytes Absolute 0.4 0.1 - 1.0 K/uL   Eosinophils Relative 5 %  Eosinophils Absolute 0.3 0.0 - 0.5 K/uL   Basophils Relative 1 %   Basophils Absolute 0.0 0.0 - 0.1 K/uL   Immature Granulocytes 0 %   Abs Immature Granulocytes 0.02 0.00 - 0.07 K/uL    Comment: Performed at [facility], [address] [place],  [address]    Radiology: CT Abdomen Pelvis W Contrast  Result Date: 03/18/2022 CLINICAL DATA:  Hernia in the mid abdomen that popped back out and has become red and sore with an abscess. EXAM: CT ABDOMEN AND PELVIS WITH CONTRAST TECHNIQUE: Multidetector CT imaging of the abdomen and pelvis was performed using the standard protocol following bolus administration of intravenous contrast. RADIATION DOSE REDUCTION: This exam was performed according to the departmental dose-optimization program which includes automated exposure control, adjustment of the mA and/or kV according to patient size and/or use of iterative reconstruction technique. CONTRAST:  175m OMNIPAQUE IOHEXOL 300 MG/ML  SOLN COMPARISON:  Feb 23, 2012, May 17, 2010 FINDINGS: Lower chest: No acute abnormality. Hepatobiliary: No focal liver abnormality is seen. Status post cholecystectomy. No biliary dilatation. Pancreas: There is a 2.3 x 2.1 cm partial calcified cystic mass in the tail of the pancreas stable compared to prior exam of 2011. the pancreas is otherwise normal. Spleen: Normal in size without focal abnormality. Adrenals/Urinary Tract: Adrenal glands are unremarkable. Kidneys are normal, without renal calculi, focal lesion, or hydronephrosis. Bladder is unremarkable. Bilateral parapelvic renal cysts are identified. Stomach/Bowel: There is a broad-based midline supra umbilical herniation large and small bowel loops and mesenteric fat more prominent compared prior exam. There is no evidence of bowel incarceration. There is 3.7 x 3.5 cm area of subcutaneous fat stranding and inflammation without focal discrete fluid collection immediately abutting the right lateral side of the hernia, inflammation of the adjacent mesentery and colonic loop is not excluded. The stomach is normal. The appendix is not seen but no inflammation is noted around cecum. Vascular/Lymphatic: Aortic atherosclerosis. No enlarged abdominal or  pelvic lymph nodes. Reproductive: Uterus and bilateral adnexa are unremarkable. Calcified uterine fibroids are noted. Other: Broad-based midline supraumbilical hernia as described above. Musculoskeletal: Degenerative joint changes of the spine are noted. IMPRESSION: Broad-based midline supra umbilical herniation large and small bowel loops and mesenteric fat more prominent compared prior exam. There is no evidence of bowel incarceration. There is 3.7 x 3.5 cm area of subcutaneous fat stranding and inflammation without focal discrete fluid collection immediately abutting the right lateral side of the hernia, inflammation of the adjacent mesentery and colonic loop is not excluded. Aortic Atherosclerosis (ICD10-I70.0). Electronically Signed   By: WAbelardo DieselM.D.   On: 03/18/2022 08:45    No results found.  CT Abdomen Pelvis W Contrast  Result Date: 03/18/2022 CLINICAL DATA:  Hernia in the mid abdomen that popped back out and has become red and sore with an abscess. EXAM: CT ABDOMEN AND PELVIS WITH CONTRAST TECHNIQUE: Multidetector CT imaging of the abdomen and pelvis was performed using the standard protocol following bolus administration of intravenous contrast. RADIATION DOSE REDUCTION: This exam was performed according to the departmental dose-optimization program which includes automated exposure control, adjustment of the mA and/or kV according to patient size and/or use of iterative reconstruction technique. CONTRAST:  1066mOMNIPAQUE IOHEXOL 300 MG/ML  SOLN COMPARISON:  Feb 23, 2012, May 17, 2010 FINDINGS: Lower chest: No acute abnormality. Hepatobiliary: No focal liver abnormality is seen. Status post cholecystectomy. No biliary dilatation. Pancreas: There is a 2.3 x 2.1 cm partial calcified cystic mass  in the tail of the pancreas stable compared to prior exam of 2011. the pancreas is otherwise normal. Spleen: Normal in size without focal abnormality. Adrenals/Urinary Tract: Adrenal glands are  unremarkable. Kidneys are normal, without renal calculi, focal lesion, or hydronephrosis. Bladder is unremarkable. Bilateral parapelvic renal cysts are identified. Stomach/Bowel: There is a broad-based midline supra umbilical herniation large and small bowel loops and mesenteric fat more prominent compared prior exam. There is no evidence of bowel incarceration. There is 3.7 x 3.5 cm area of subcutaneous fat stranding and inflammation without focal discrete fluid collection immediately abutting the right lateral side of the hernia, inflammation of the adjacent mesentery and colonic loop is not excluded. The stomach is normal. The appendix is not seen but no inflammation is noted around cecum. Vascular/Lymphatic: Aortic atherosclerosis. No enlarged abdominal or pelvic lymph nodes. Reproductive: Uterus and bilateral adnexa are unremarkable. Calcified uterine fibroids are noted. Other: Broad-based midline supraumbilical hernia as described above. Musculoskeletal: Degenerative joint changes of the spine are noted. IMPRESSION: Broad-based midline supra umbilical herniation large and small bowel loops and mesenteric fat more prominent compared prior exam. There is no evidence of bowel incarceration. There is 3.7 x 3.5 cm area of subcutaneous fat stranding and inflammation without focal discrete fluid collection immediately abutting the right lateral side of the hernia, inflammation of the adjacent mesentery and colonic loop is not excluded. Aortic Atherosclerosis (ICD10-I70.0). Electronically Signed   By: Abelardo Diesel M.D.   On: 03/18/2022 08:45      Assessment and Plan: Patient Active Problem List   Diagnosis Date Noted   Chronic allergic conjunctivitis 12/02/2021   Allergic rhinitis due to animal (cat) (dog) hair and dander 12/02/2021   Allergic rhinitis due to pollen 12/02/2021   STEMI (ST elevation myocardial infarction) ([place]) 06/06/2021   History of colonic polyps    Candidiasis of skin 01/04/2020    Ventral hernia without obstruction or gangrene 11/28/2019   Herpes simplex infection 10/27/2019   Stomatitis and mucositis 10/27/2019   Vitamin B12 deficiency 06/30/2018   Unspecified menopausal and perimenopausal disorder 06/30/2018   Vitamin D deficiency 06/30/2018   Acquired hypothyroidism 02/05/2018   Essential hypertension 02/05/2018   Obstructive sleep apnea (adult) (pediatric) 01/06/2018    1. Obstructive sleep apnea Continue excellent compliance  2. CPAP use counseling CPAP couseling-Discussed importance of adequate CPAP use as well as proper care and cleaning techniques of machine and all supplies.  3. Essential hypertension Slightly elevated in office, continue current medications and monitoring and follow up with PCP  4. Obesity (BMI 30-39.9) Obesity Counseling: Had a lengthy discussion regarding patients BMI and weight issues. Patient was instructed on portion control as well as increased activity. Also discussed caloric restrictions with trying to maintain intake less than 2000 Kcal. Discussions were made in accordance with the 5As of weight management. Simple actions such as not eating late and if able to, taking a walk is suggested.    General Counseling: I have discussed the findings of the evaluation and examination with Claudia [place].  I have also discussed any further diagnostic evaluation thatmay be needed or ordered today. Claudia [place] verbalizes understanding of the findings of todays visit. We also reviewed her medications today and discussed drug interactions and side effects including but not limited excessive drowsiness and altered mental states. We also discussed that there is always a risk not just to her but also people around her. she has been encouraged to call the office with any questions or concerns that should arise related  to todays visit.  No orders of the defined types were placed in this encounter.    Time spent: 30  I have personally obtained a history,  examined the patient, evaluated laboratory and imaging results, formulated the assessment and plan and placed orders. This patient was seen by Drema [place], PA-C in collaboration with Dr. Devona Konig as a part of collaborative care agreement.     Allyne Gee, MD [facility] Pulmonary and Critical Care Sleep medicine

## 2022-04-14 ENCOUNTER — Other Ambulatory Visit: Payer: Self-pay

## 2022-04-14 ENCOUNTER — Telehealth: Payer: Self-pay

## 2022-04-14 ENCOUNTER — Encounter: Payer: Self-pay | Admitting: Surgery

## 2022-04-14 ENCOUNTER — Ambulatory Visit (INDEPENDENT_AMBULATORY_CARE_PROVIDER_SITE_OTHER): Payer: Medicare Other | Admitting: Surgery

## 2022-04-14 VITALS — BP 155/78 | HR 80 | Temp 98.3°F | Ht <= 58 in | Wt 174.4 lb

## 2022-04-14 DIAGNOSIS — K439 Ventral hernia without obstruction or gangrene: Secondary | ICD-10-CM

## 2022-04-14 DIAGNOSIS — K432 Incisional hernia without obstruction or gangrene: Secondary | ICD-10-CM

## 2022-04-14 MED ORDER — DIPHENHYDRAMINE HCL 50 MG PO TABS: 50.0000 mg | ORAL_TABLET | Freq: Once | ORAL | 0 refills | Status: DC

## 2022-04-14 MED ORDER — PREDNISONE 50 MG PO TABS: ORAL_TABLET | ORAL | 0 refills | Status: DC

## 2022-04-14 NOTE — Patient Instructions (Addendum)
Please pick up your medications at the pharmacy . Please see your follow up appointment listed below. I will call you later today with your CT appointment information. You will need to pick up the oral contrast tomorrow as well at [facility]patient [facility].

## 2022-04-14 NOTE — Telephone Encounter (Signed)
CT scheduled 04/28/22 @ 2:30 pm @ [facility]. . Patient to pick up prep kit and instructions. Nothing to eat/drink 4 hours prior.  Prednisone 50 mg , take 1 tablet 13 hours prior, take 7 hours prior and take one 1 hours prior to contrast injection.   Benadryl 50 mg take 1 tablet within 1 hour of injection.  Sent to pharmacy.   Patient notified. Verbalized understanding.  

## 2022-04-15 DIAGNOSIS — J3089 Other allergic rhinitis: Secondary | ICD-10-CM | POA: Diagnosis not present

## 2022-04-15 DIAGNOSIS — J3081 Allergic rhinitis due to animal (cat) (dog) hair and dander: Secondary | ICD-10-CM | POA: Diagnosis not present

## 2022-04-15 DIAGNOSIS — J301 Allergic rhinitis due to pollen: Secondary | ICD-10-CM | POA: Diagnosis not present

## 2022-04-17 ENCOUNTER — Encounter: Payer: Self-pay | Admitting: Surgery

## 2022-04-17 ENCOUNTER — Encounter: Payer: Self-pay | Admitting: Nurse Practitioner

## 2022-04-17 NOTE — Progress Notes (Signed)
Outpatient Surgical Follow Up  04/17/2022  Claudia Martin is an 76 y.o. female.   Chief Complaint  Patient presents with   Follow-up    Ventral hernia / abd infection    HPI: f/u for recurrent ventral hernia and abdominal wall infection vs mesh infection. Completed antibiotic treatment and abdominal wall seems to have improved. She obviously still has the hernia w large defect. Reports some swelling around the abdominal wall and pain the pain is mild intermittent and worsening with certain Valsalva.  He does notice some redness around it.  There is no specific alleviating factors.  She has multiple comorbidities including high BMI, CAD s/p DES stent 06/2021 on  Brilinta and ASA She did have a CT  that  I have ordered and  that I have personally reviewed showing evidence of large ventral hernia w a nodule anterior to the fasci and to the right. No evidence of bowel incarceration, no abscess, still can not exclude mesh inffection vs EIC that is infected..  She continues to report some intermittent discomfort on the abdominal wall not on the right side of the abdominal wall.   Past Medical History:  Diagnosis Date   Abdominal pain    Allergic rhinitis    Arthritis    Candidiasis of vulva and vagina    Cellulitis and abscess    Diverticulosis    Dysuria    Enlarged heart    Eyelid inflammation    GERD (gastroesophageal reflux disease)    Hematuria syndrome    Hemorrhoids    Herpes genitalis    Herpes simplex    HTN (hypertension)    Hydronephrosis    Hypothyroidism    Incomplete bladder emptying    Internal hordeolum    Knee pain    Otalgia    Primary ovarian failure    Sciatica    Shortness of breath    Sinus bradycardia    Sleep apnea    uses CPAP   Urethral diverticulum    UTI (lower urinary tract infection)    Vaginal atrophy    Vaginitis     Past Surgical History:  Procedure Laterality Date   BREAST BIOPSY     CATARACT EXTRACTION W/PHACO Right 06/05/2021    Procedure: CATARACT EXTRACTION PHACO AND INTRAOCULAR LENS PLACEMENT ([place]) RIGHT;  Surgeon: Leandrew Koyanagi, MD;  Location: [facility];  Service: Ophthalmology;  Laterality: Right;  3.02 00:39.5   CATARACT EXTRACTION W/PHACO Left 12/04/2021   Procedure: CATARACT EXTRACTION PHACO AND INTRAOCULAR LENS PLACEMENT ([place]) LEFT;  Surgeon: Leandrew Koyanagi, MD;  Location: [place];  Service: Ophthalmology;  Laterality: Left;  6.23 00:48.0   COLON SURGERY     COLONOSCOPY WITH PROPOFOL N/A 03/15/2018   Procedure: COLONOSCOPY WITH PROPOFOL;  Surgeon: Lin Landsman, MD;  Location: [facility] ENDOSCOPY;  Service: Gastroenterology;  Laterality: N/A;   COLONOSCOPY WITH PROPOFOL N/A 01/09/2021   Procedure: COLONOSCOPY WITH PROPOFOL;  Surgeon: Lin Landsman, MD;  Location: [facility] ENDOSCOPY;  Service: Gastroenterology;  Laterality: N/A;   CORONARY/GRAFT ACUTE MI REVASCULARIZATION N/A 06/06/2021   Procedure: Coronary/Graft Acute MI Revascularization;  Surgeon: Yolonda Kida, MD;  Location: [place] [facility];  Service: Cardiovascular;  Laterality: N/A;   diverticulitis     ECTOPIC PREGNANCY SURGERY     fx thumb     HERNIA REPAIR     LEFT HEART CATH AND CORONARY ANGIOGRAPHY N/A 06/06/2021   Procedure: LEFT HEART CATH AND CORONARY ANGIOGRAPHY;  Surgeon: Yolonda Kida, MD;  Location: [place] [facility];  Service: Cardiovascular;  Laterality: N/A;    Family History  Problem Relation Age of Onset   Tuberculosis Mother    Nephrolithiasis Brother    Vision loss Daughter    Hypertension Daughter     Social History:  reports that she has never smoked. She has never been exposed to tobacco smoke. She has never used smokeless tobacco. She reports that she does not drink alcohol and does not use drugs.  Allergies:  Allergies  Allergen Reactions   Other Diarrhea and Hives   Augmentin [Amoxicillin-Pot Clavulanate]    Bactrim [Sulfamethoxazole-Trimethoprim] Hives    Doxycycline    Ibuprofen     Other reaction(s): Unknown   Ivp Dye [Iodinated Contrast Media] Diarrhea and Hives   Macrobid [Nitrofurantoin]    Prednisone Nausea Only    Other reaction(s): Dizziness    Medications reviewed.    ROS Full ROS performed and is otherwise negative other than what is stated in HPI   BP (!) 155/78   Pulse 80   Temp 98.3 F (36.8 C) (Oral)   Ht '4\' 9"'$  (1.448 m)   Wt 174 lb 6.4 oz (79.1 kg)   SpO2 97%   BMI 37.74 kg/m   Physical Exam Physical Exam CONSTITUTIONAL: NAD. EYES: Pupils are equal, round, Sclera are non-icteric. EARS, NOSE, MOUTH AND THROAT: The oral mucosa is pink and moist. Hearing is intact to voice. LYMPH NODES:  Lymph nodes in the neck are normal. RESPIRATORY:  Lungs are clear. There is normal respiratory effort, with equal breath sounds bilaterally, and without pathologic use of accessory muscles. CARDIOVASCULAR: Heart is regular without murmurs, gallops, or rubs. GI: The abdomen is  soft,there is evidence of a diathesis recti and ventral hernia that is large, difficult to measure the size of the defect given body habitus. There is an additional induration to the right of the midline. I can not rule out potential infected EIC vs mesh infection. No peritonitis. No abscess GU: Rectal.  deferred.   MUSCULOSKELETAL: Normal muscle strength and tone. No cyanosis or edema.   SKIN: Turgor is good and there are no pathologic skin lesions or ulcers. NEUROLOGIC: Motor and sensation is grossly normal. Cranial nerves are grossly intact. PSYCH:  Oriented to person, place and time. Affect is normal.   Assessment/Plan: 76 year old female with recurrent ventral hernia with loss of domain and soft tissue density that might be related to an infected cyst versus abscess and cannot rule out mesh infection chronically.   She does not need any emergent surgical intervention.  I do think that I would like to repeat the CT scan to follow the collection and  figured out whether or not this is infected mesh or not.  That will help US guide operative management as well as planning.  Gust with her that she is not quite ready for elective abdominal wall reconstruction.  She does have room for improvement regarding her weight and other comorbidities. Discussed with her in detail and she expressed understanding.  Please note that I spent 30 minutes in this encounter including coordination of her care, placing orders, personally reviewing images and performing appropriate documentation    Caroleen Hamman, MD [facility] General Surgeon

## 2022-04-22 DIAGNOSIS — J3081 Allergic rhinitis due to animal (cat) (dog) hair and dander: Secondary | ICD-10-CM | POA: Diagnosis not present

## 2022-04-22 DIAGNOSIS — J301 Allergic rhinitis due to pollen: Secondary | ICD-10-CM | POA: Diagnosis not present

## 2022-04-22 DIAGNOSIS — J3089 Other allergic rhinitis: Secondary | ICD-10-CM | POA: Diagnosis not present

## 2022-04-28 ENCOUNTER — Ambulatory Visit
Admission: RE | Admit: 2022-04-28 | Discharge: 2022-04-28 | Disposition: A | Discharge status: Home / Self Care | Payer: Medicare Other | Source: Ambulatory Visit | Attending: Surgery | Admitting: Surgery

## 2022-04-28 DIAGNOSIS — R109 Unspecified abdominal pain: Secondary | ICD-10-CM | POA: Diagnosis not present

## 2022-04-28 DIAGNOSIS — K432 Incisional hernia without obstruction or gangrene: Secondary | ICD-10-CM | POA: Insufficient documentation

## 2022-04-28 DIAGNOSIS — I700 Atherosclerosis of aorta: Secondary | ICD-10-CM | POA: Diagnosis not present

## 2022-04-28 MED ORDER — PREDNISONE 50 MG PO TABS: 50.0000 mg | ORAL_TABLET | Freq: Four times a day (QID) | ORAL | Status: DC

## 2022-04-28 MED ORDER — DIPHENHYDRAMINE HCL 50 MG PO CAPS: 50.0000 mg | ORAL_CAPSULE | Freq: Once | ORAL | Status: DC

## 2022-04-28 MED ORDER — IOHEXOL 300 MG/ML  SOLN
100.0000 mL | Freq: Once | INTRAMUSCULAR | Status: AC | PRN
  Administered 2022-04-28: 100 mL via INTRAVENOUS

## 2022-04-28 MED ORDER — DIPHENHYDRAMINE HCL 50 MG/ML IJ SOLN: 50.0000 mg | Freq: Once | INTRAMUSCULAR | Status: DC

## 2022-04-29 DIAGNOSIS — J3089 Other allergic rhinitis: Secondary | ICD-10-CM | POA: Diagnosis not present

## 2022-04-29 DIAGNOSIS — J3081 Allergic rhinitis due to animal (cat) (dog) hair and dander: Secondary | ICD-10-CM | POA: Diagnosis not present

## 2022-04-29 DIAGNOSIS — J301 Allergic rhinitis due to pollen: Secondary | ICD-10-CM | POA: Diagnosis not present

## 2022-04-29 DIAGNOSIS — H1045 Other chronic allergic conjunctivitis: Secondary | ICD-10-CM | POA: Diagnosis not present

## 2022-04-30 ENCOUNTER — Telehealth: Payer: Self-pay

## 2022-04-30 NOTE — Telephone Encounter (Signed)
Call to patient to let her know her CT scan results. Message left letting her know that per Dr Dahlia Byes her CT only showed the known hernia and no infection. She may call us with any questions. She will follow up here as scheduled to see Dr Dahlia Byes.

## 2022-05-05 ENCOUNTER — Encounter: Payer: Self-pay | Admitting: Surgery

## 2022-05-05 ENCOUNTER — Ambulatory Visit (INDEPENDENT_AMBULATORY_CARE_PROVIDER_SITE_OTHER): Payer: Medicare Other | Admitting: Surgery

## 2022-05-05 ENCOUNTER — Telehealth: Payer: Self-pay | Admitting: Surgery

## 2022-05-05 VITALS — BP 146/76 | HR 90 | Temp 98.5°F | Wt 173.8 lb

## 2022-05-05 DIAGNOSIS — K432 Incisional hernia without obstruction or gangrene: Secondary | ICD-10-CM

## 2022-05-05 DIAGNOSIS — K439 Ventral hernia without obstruction or gangrene: Secondary | ICD-10-CM | POA: Diagnosis not present

## 2022-05-05 NOTE — Telephone Encounter (Signed)
Patient has been advised of Pre-Admission date/time, and Surgery date.  Surgery Date: 05/27/22 Preadmission Testing Date: 05/19/22 (phone 8a-1p)  Patient has been made aware to call [phone], between 1-3:00pm the day before surgery, to find out what time to arrive for surgery.

## 2022-05-05 NOTE — Patient Instructions (Signed)
Our surgery scheduler Barbara will call you within 24-48 hours to get you scheduled. If you have not heard from her after 48 hours, please call our office. Have the blue sheet available when she calls to write down important information.   If you have any concerns or questions, please feel free to call our office.   Ventral Hernia  A ventral hernia is a bulge of tissue from inside the abdomen that pushes through a weak area of the muscles that form the front wall of the abdomen. The tissues inside the abdomen are inside a sac (peritoneum). These tissues include the small intestine, large intestine, and the fatty tissue that covers the intestines (omentum). Sometimes, the bulge that forms a hernia contains intestines. Other hernias contain only fat. Ventral hernias do not go away without surgical treatment. There are several types of ventral hernias. You may have: A hernia at an incision site from previous abdominal surgery (incisional hernia). A hernia just above the belly button (epigastric hernia), or at the belly button (umbilical hernia). These types of hernias can develop from heavy lifting or straining. A hernia that comes and goes (reducible hernia). It may be visible only when you lift or strain. This type of hernia can be pushed back into the abdomen (reduced). A hernia that traps abdominal tissue inside the hernia (incarcerated hernia). This type of hernia does not reduce. A hernia that cuts off blood flow to the tissues inside the hernia (strangulated hernia). The tissues can start to die if this happens. This is a very painful bulge that cannot be reduced. A strangulated hernia is a medical emergency. What are the causes? This condition is caused by abdominal tissue putting pressure on an area of weakness in the abdominal muscles. What increases the risk? The following factors may make you more likely to develop this condition: Being age 60 or older. Being overweight or obese. Having  had previous abdominal surgery, especially if there was an infection after surgery. Having had an injury to the abdominal wall. Frequently lifting or pushing heavy objects. Having had several pregnancies. Having a buildup of fluid inside the abdomen (ascites). Straining to have a bowel movement or to urinate. Having frequent coughing episodes. What are the signs or symptoms? The only symptom of a ventral hernia may be a painless bulge in the abdomen. A reducible hernia may be visible only when you strain, cough, or lift. Other symptoms may include: Dull pain. A feeling of pressure. Signs and symptoms of a strangulated hernia may include: Increasing pain. Nausea and vomiting. Pain when pressing on the hernia. The skin over the hernia turning red or purple. Constipation. Blood in the stool (feces). How is this diagnosed? This condition may be diagnosed based on: Your symptoms. Your medical history. A physical exam. You may be asked to cough or strain while standing. These actions increase the pressure inside your abdomen and force the hernia through the opening in your muscles. Your health care provider may try to reduce the hernia by gently pushing the hernia back in. Imaging studies, such as an ultrasound or CT scan. How is this treated? This condition is treated with surgery. If you have a strangulated hernia, surgery is done as soon as possible. If your hernia is small and not incarcerated, you may be asked to lose some weight before surgery. Follow these instructions at home: Follow instructions from your health care provider about eating or drinking restrictions. If you are overweight, your health care provider may recommend   that you increase your activity level and eat a healthier diet. Do not lift anything that is heavier than 10 lb (4.5 kg), or the limit that you are told, until your health care provider says that it is safe. Return to your normal activities as told by your  health care provider. Ask your health care provider what activities are safe for you. You may need to avoid activities that increase pressure on your hernia. Take over-the-counter and prescription medicines only as told by your health care provider. Keep all follow-up visits. This is important. Contact a health care provider if: Your hernia gets larger. Your hernia becomes painful. Get help right away if: Your hernia becomes increasingly painful. You have pain along with any of the following: Changes in skin color in the area of the hernia. Nausea. Vomiting. Fever. These symptoms may represent a serious problem that is an emergency. Do not wait to see if the symptoms will go away. Get medical help right away. Call your local emergency services (911 in the [place] Do not drive yourself to the hospital. Summary A ventral hernia is a bulge of tissue from inside the abdomen that pushes through a weak area of the muscles that form the front wall of the abdomen. This condition is treated with surgery, which may be urgent depending on your hernia. Do not lift anything that is heavier than 10 lb (4.5 kg), and follow activity instructions from your health care provider. This information is not intended to replace advice given to you by your health care provider. Make sure you discuss any questions you have with your health care provider. Document Revised: 05/11/2020 Document Reviewed: 05/11/2020 Elsevier Patient Education  2023 Elsevier Inc.  

## 2022-05-06 DIAGNOSIS — I10 Essential (primary) hypertension: Secondary | ICD-10-CM | POA: Diagnosis not present

## 2022-05-06 DIAGNOSIS — Z01818 Encounter for other preprocedural examination: Secondary | ICD-10-CM | POA: Diagnosis not present

## 2022-05-06 DIAGNOSIS — J301 Allergic rhinitis due to pollen: Secondary | ICD-10-CM | POA: Diagnosis not present

## 2022-05-06 DIAGNOSIS — G4733 Obstructive sleep apnea (adult) (pediatric): Secondary | ICD-10-CM | POA: Diagnosis not present

## 2022-05-06 DIAGNOSIS — J3089 Other allergic rhinitis: Secondary | ICD-10-CM | POA: Diagnosis not present

## 2022-05-06 DIAGNOSIS — I2510 Atherosclerotic heart disease of native coronary artery without angina pectoris: Secondary | ICD-10-CM | POA: Diagnosis not present

## 2022-05-06 DIAGNOSIS — I2121 ST elevation (STEMI) myocardial infarction involving left circumflex coronary artery: Secondary | ICD-10-CM | POA: Diagnosis not present

## 2022-05-06 DIAGNOSIS — R040 Epistaxis: Secondary | ICD-10-CM | POA: Diagnosis not present

## 2022-05-06 DIAGNOSIS — E782 Mixed hyperlipidemia: Secondary | ICD-10-CM | POA: Diagnosis not present

## 2022-05-06 DIAGNOSIS — J3081 Allergic rhinitis due to animal (cat) (dog) hair and dander: Secondary | ICD-10-CM | POA: Diagnosis not present

## 2022-05-06 NOTE — H&P (View-Only) (Signed)
Outpatient Surgical Follow Up  05/06/2022  Claudia Martin is an 76 y.o. female.   Chief Complaint  Patient presents with   Follow-up    Ventral hernia    HPI:  f/u for recurrent ventral hernia and abdominal wall infection vs mesh infection. Completed antibiotic treatment and abdominal wall seems to have improved. She obviously still has the hernia w large defect. Reports some swelling around the abdominal wall and pain the pain is mild intermittent and worsening with certain Valsalva. Redness has resolved.  She has multiple comorbidities including high BMI, CAD s/p DES stent 06/2021 on  Brilinta and ASA She did have a repeeat CT  that  I have ordered and  that I have personally reviewed showing evidence of large ventral hernia w a improvement of induration, there is colon and SB likely chronically incarcerated. No evidence of bowel incarceration, no abscess, She continues to report some intermittent discomfort on the abdominal wall not on the right side of the abdominal wall. No chest pain or acute coronary sxs.  Past Medical History:  Diagnosis Date   Abdominal pain    Allergic rhinitis    Arthritis    Candidiasis of vulva and vagina    Cellulitis and abscess    Diverticulosis    Dysuria    Enlarged heart    Eyelid inflammation    GERD (gastroesophageal reflux disease)    Hematuria syndrome    Hemorrhoids    Herpes genitalis    Herpes simplex    HTN (hypertension)    Hydronephrosis    Hypothyroidism    Incomplete bladder emptying    Internal hordeolum    Knee pain    Otalgia    Primary ovarian failure    Sciatica    Shortness of breath    Sinus bradycardia    Sleep apnea    uses CPAP   Urethral diverticulum    UTI (lower urinary tract infection)    Vaginal atrophy    Vaginitis     Past Surgical History:  Procedure Laterality Date   BREAST BIOPSY     CATARACT EXTRACTION W/PHACO Right 06/05/2021   Procedure: CATARACT EXTRACTION PHACO AND INTRAOCULAR LENS  PLACEMENT ([place]) RIGHT;  Surgeon: Leandrew Koyanagi, MD;  Location: [place];  Service: Ophthalmology;  Laterality: Right;  3.02 00:39.5   CATARACT EXTRACTION W/PHACO Left 12/04/2021   Procedure: CATARACT EXTRACTION PHACO AND INTRAOCULAR LENS PLACEMENT ([place]) LEFT;  Surgeon: Leandrew Koyanagi, MD;  Location: [place];  Service: Ophthalmology;  Laterality: Left;  6.23 00:48.0   COLON SURGERY     COLONOSCOPY WITH PROPOFOL N/A 03/15/2018   Procedure: COLONOSCOPY WITH PROPOFOL;  Surgeon: Lin Landsman, MD;  Location[facility] ENDOSCOPY;  Service: Gastroenterology;  Laterality: N/A;   COLONOSCOPY WITH PROPOFOL N/A 01/09/2021   Procedure: COLONOSCOPY WITH PROPOFOL;  Surgeon: Lin Landsman, MD;  Location: [facility] ENDOSCOPY;  Service: Gastroenterology;  Laterality: N/A;   CORONARY/GRAFT ACUTE MI REVASCULARIZATION N/A 06/06/2021   Procedure: Coronary/Graft Acute MI Revascularization;  Surgeon: Yolonda Kida, MD;  Location: [place] [facility];  Service: Cardiovascular;  Laterality: N/A;   diverticulitis     ECTOPIC PREGNANCY SURGERY     fx thumb     HERNIA REPAIR     LEFT HEART CATH AND CORONARY ANGIOGRAPHY N/A 06/06/2021   Procedure: LEFT HEART CATH AND CORONARY ANGIOGRAPHY;  Surgeon: Yolonda Kida, MD;  Location: [place] [facility];  Service: Cardiovascular;  Laterality: N/A;    Family History  Problem Relation Age of Onset   Tuberculosis Mother    Nephrolithiasis Brother    Vision loss Daughter    Hypertension Daughter     Social History:  reports that she has never smoked. She has never been exposed to tobacco smoke. She has never used smokeless tobacco. She reports that she does not drink alcohol and does not use drugs.  Allergies:  Allergies  Allergen Reactions   Other Diarrhea and Hives   Augmentin [Amoxicillin-Pot Clavulanate]    Bactrim [Sulfamethoxazole-Trimethoprim] Hives   Doxycycline    Ibuprofen     Other reaction(s): Unknown   Ivp Dye  [Iodinated Contrast Media] Diarrhea   Macrobid [Nitrofurantoin]    Prednisone Nausea Only    Other reaction(s): Dizziness    Medications reviewed.    ROS Full ROS performed and is otherwise negative other than what is stated in HPI   BP (!) 146/76   Pulse 90   Temp 98.5 F (36.9 C) (Oral)   Wt 173 lb 12.8 oz (78.8 kg)   SpO2 95%   BMI 37.61 kg/m   Physical Exam Physical Exam CONSTITUTIONAL: NAD. EYES: Pupils are equal, round, Sclera are non-icteric. EARS, NOSE, MOUTH AND THROAT: The oral mucosa is pink and moist. Hearing is intact to voice. LYMPH NODES:  Lymph nodes in the neck are normal. RESPIRATORY:  Lungs are clear. There is normal respiratory effort, with equal breath sounds bilaterally, and without pathologic use of accessory muscles. CARDIOVASCULAR: Heart is regular without murmurs, gallops, or rubs. GI: The abdomen is  soft,there is evidence of a diathesis recti and ventral hernia that is large,  the size is greater than 10 cms, Loss of domain. Induration from prior exam has resolved.  No peritonitis. No abscess GU: Rectal.  deferred.   MUSCULOSKELETAL: Normal muscle strength and tone. No cyanosis or edema.   SKIN: Turgor is good and there are no pathologic skin lesions or ulcers. NEUROLOGIC: Motor and sensation is grossly normal. Cranial nerves are grossly intact. PSYCH:  Oriented to person, place and time. Affect is normal.   Assessment/Plan: 76 year old female with symptomatic recurrent ventral hernia and loss of domain.  She Does have significant history of coronary artery disease and is on dual antiplatelet therapy to include Brilinta and aspirin. We will double check with cardiology about the feasibility of holding the [place]. I am okay with continuation of aspirin. We will tentatively schedule her for surgery next month. Procedure discussed with the patient in detail.  Risks, benefits and possible implications including but not limited to: Bleeding,  infection, coronary events, recurrence, chronic pain, even cardiovascular and pulmonary complications.  She understands and wished to proceed We have received the greenlight from cardiology regarding cardiac optimization. I spent greater than 45 minutes in this encounter including coordination of her care, personally reviewing imaging studies,  placing orders and performing appropriate documentation   Caroleen Hamman, MD [place] Surgeon

## 2022-05-06 NOTE — Progress Notes (Signed)
Outpatient Surgical Follow Up  05/06/2022  Claudia Martin is an 76 y.o. female.   Chief Complaint  Patient presents with   Follow-up    Ventral hernia    HPI:  f/u for recurrent ventral hernia and abdominal wall infection vs mesh infection. Completed antibiotic treatment and abdominal wall seems to have improved. She obviously still has the hernia w large defect. Reports some swelling around the abdominal wall and pain the pain is mild intermittent and worsening with certain Valsalva. Redness has resolved.  She has multiple comorbidities including high BMI, CAD s/p DES stent 06/2021 on  Brilinta and ASA She did have a repeeat CT  that  I have ordered and  that I have personally reviewed showing evidence of large ventral hernia w a improvement of induration, there is colon and SB likely chronically incarcerated. No evidence of bowel incarceration, no abscess, She continues to report some intermittent discomfort on the abdominal wall not on the right side of the abdominal wall. No chest pain or acute coronary sxs.  Past Medical History:  Diagnosis Date   Abdominal pain    Allergic rhinitis    Arthritis    Candidiasis of vulva and vagina    Cellulitis and abscess    Diverticulosis    Dysuria    Enlarged heart    Eyelid inflammation    GERD (gastroesophageal reflux disease)    Hematuria syndrome    Hemorrhoids    Herpes genitalis    Herpes simplex    HTN (hypertension)    Hydronephrosis    Hypothyroidism    Incomplete bladder emptying    Internal hordeolum    Knee pain    Otalgia    Primary ovarian failure    Sciatica    Shortness of breath    Sinus bradycardia    Sleep apnea    uses CPAP   Urethral diverticulum    UTI (lower urinary tract infection)    Vaginal atrophy    Vaginitis     Past Surgical History:  Procedure Laterality Date   BREAST BIOPSY     CATARACT EXTRACTION W/PHACO Right 06/05/2021   Procedure: CATARACT EXTRACTION PHACO AND INTRAOCULAR LENS  PLACEMENT ([place]) RIGHT;  Surgeon: Leandrew Koyanagi, MD;  Location: [place];  Service: Ophthalmology;  Laterality: Right;  3.02 00:39.5   CATARACT EXTRACTION W/PHACO Left 12/04/2021   Procedure: CATARACT EXTRACTION PHACO AND INTRAOCULAR LENS PLACEMENT ([place]) LEFT;  Surgeon: Leandrew Koyanagi, MD;  Location: [place];  Service: Ophthalmology;  Laterality: Left;  6.23 00:48.0   COLON SURGERY     COLONOSCOPY WITH PROPOFOL N/A 03/15/2018   Procedure: COLONOSCOPY WITH PROPOFOL;  Surgeon: Lin Landsman, MD;  Location: [facility];  Service: Gastroenterology;  Laterality: N/A;   COLONOSCOPY WITH PROPOFOL N/A 01/09/2021   Procedure: COLONOSCOPY WITH PROPOFOL;  Surgeon: Lin Landsman, MD;  Location: [facility] ENDOSCOPY;  Service: Gastroenterology;  Laterality: N/A;   CORONARY/GRAFT ACUTE MI REVASCULARIZATION N/A 06/06/2021   Procedure: Coronary/Graft Acute MI Revascularization;  Surgeon: Yolonda Kida, MD;  Location: [place] [facility];  Service: Cardiovascular;  Laterality: N/A;   diverticulitis     ECTOPIC PREGNANCY SURGERY     fx thumb     HERNIA REPAIR     LEFT HEART CATH AND CORONARY ANGIOGRAPHY N/A 06/06/2021   Procedure: LEFT HEART CATH AND CORONARY ANGIOGRAPHY;  Surgeon: Yolonda Kida, MD;  Location: [place] [facility];  Service: Cardiovascular;  Laterality: N/A;    Family History  Problem Relation Age of Onset   Tuberculosis Mother    Nephrolithiasis Brother    Vision loss Daughter    Hypertension Daughter     Social History:  reports that she has never smoked. She has never been exposed to tobacco smoke. She has never used smokeless tobacco. She reports that she does not drink alcohol and does not use drugs.  Allergies:  Allergies  Allergen Reactions   Other Diarrhea and Hives   Augmentin [Amoxicillin-Pot Clavulanate]    Bactrim [Sulfamethoxazole-Trimethoprim] Hives   Doxycycline    Ibuprofen     Other reaction(s): Unknown   Ivp Dye  [Iodinated Contrast Media] Diarrhea   Macrobid [Nitrofurantoin]    Prednisone Nausea Only    Other reaction(s): Dizziness    Medications reviewed.    ROS Full ROS performed and is otherwise negative other than what is stated in HPI   BP (!) 146/76   Pulse 90   Temp 98.5 F (36.9 C) (Oral)   Wt 173 lb 12.8 oz (78.8 kg)   SpO2 95%   BMI 37.61 kg/m   Physical Exam Physical Exam CONSTITUTIONAL: NAD. EYES: Pupils are equal, round, Sclera are non-icteric. EARS, NOSE, MOUTH AND THROAT: The oral mucosa is pink and moist. Hearing is intact to voice. LYMPH NODES:  Lymph nodes in the neck are normal. RESPIRATORY:  Lungs are clear. There is normal respiratory effort, with equal breath sounds bilaterally, and without pathologic use of accessory muscles. CARDIOVASCULAR: Heart is regular without murmurs, gallops, or rubs. GI: The abdomen is  soft,there is evidence of a diathesis recti and ventral hernia that is large,  the size is greater than 10 cms, Loss of domain. Induration from prior exam has resolved.  No peritonitis. No abscess GU: Rectal.  deferred.   MUSCULOSKELETAL: Normal muscle strength and tone. No cyanosis or edema.   SKIN: Turgor is good and there are no pathologic skin lesions or ulcers. NEUROLOGIC: Motor and sensation is grossly normal. Cranial nerves are grossly intact. PSYCH:  Oriented to person, place and time. Affect is normal.   Assessment/Plan: 76 year old female with symptomatic recurrent ventral hernia and loss of domain.  She Does have significant history of coronary artery disease and is on dual antiplatelet therapy to include Brilinta and aspirin. We will double check with cardiology about the feasibility of holding the [place]. I am okay with continuation of aspirin. We will tentatively schedule her for surgery next month. Procedure discussed with the patient in detail.  Risks, benefits and possible implications including but not limited to: Bleeding,  infection, coronary events, recurrence, chronic pain, even cardiovascular and pulmonary complications.  She understands and wished to proceed We have received the greenlight from cardiology regarding cardiac optimization. I spent greater than 45 minutes in this encounter including coordination of her care, personally reviewing imaging studies,  placing orders and performing appropriate documentation   Caroleen Hamman, MD [facility]

## 2022-05-12 ENCOUNTER — Other Ambulatory Visit: Payer: Self-pay

## 2022-05-16 DIAGNOSIS — J3089 Other allergic rhinitis: Secondary | ICD-10-CM | POA: Diagnosis not present

## 2022-05-16 DIAGNOSIS — J3081 Allergic rhinitis due to animal (cat) (dog) hair and dander: Secondary | ICD-10-CM | POA: Diagnosis not present

## 2022-05-16 DIAGNOSIS — J301 Allergic rhinitis due to pollen: Secondary | ICD-10-CM | POA: Diagnosis not present

## 2022-05-19 ENCOUNTER — Encounter
Admission: RE | Admit: 2022-05-19 | Discharge: 2022-05-19 | Disposition: A | Discharge status: Home / Self Care | Payer: Medicare Other | Source: Ambulatory Visit | Attending: Surgery | Admitting: Surgery

## 2022-05-19 ENCOUNTER — Encounter: Payer: Self-pay | Admitting: Urgent Care

## 2022-05-19 VITALS — Ht 68.0 in | Wt 175.0 lb

## 2022-05-19 DIAGNOSIS — G4733 Obstructive sleep apnea (adult) (pediatric): Secondary | ICD-10-CM

## 2022-05-19 DIAGNOSIS — I10 Essential (primary) hypertension: Secondary | ICD-10-CM | POA: Diagnosis not present

## 2022-05-19 DIAGNOSIS — I252 Old myocardial infarction: Secondary | ICD-10-CM | POA: Insufficient documentation

## 2022-05-19 DIAGNOSIS — I213 ST elevation (STEMI) myocardial infarction of unspecified site: Secondary | ICD-10-CM

## 2022-05-19 DIAGNOSIS — Z01810 Encounter for preprocedural cardiovascular examination: Secondary | ICD-10-CM | POA: Insufficient documentation

## 2022-05-19 HISTORY — DX: Acute myocardial infarction, unspecified: I21.9

## 2022-05-19 NOTE — Patient Instructions (Signed)
Your procedure is scheduled on: 05/27/22 Report to [place]. To find out your arrival time please call [phone] between 1PM - 3PM on 05/26/22.  Remember: Instructions that are not followed completely may result in serious medical risk, up to and including death, or upon the discretion of your surgeon and anesthesiologist your surgery may need to be rescheduled.     _X__ 1. Do not eat food or drink any liquids after midnight the night before your procedure.                 No gum chewing or hard candies.   __X__2.  On the morning of surgery brush your teeth with toothpaste and water, you                 may rinse your mouth with mouthwash if you wish.  Do not swallow any              toothpaste of mouthwash.     _X__ 3.  No Alcohol for 24 hours before or after surgery.   _X__ 4.  Do Not Smoke or use e-cigarettes For 24 Hours Prior to Your Surgery.                 Do not use any chewable tobacco products for at least 6 hours prior to                 surgery.  ____  5.  Bring all medications with you on the day of surgery if instructed.   __X__  6.  Notify your doctor if there is any change in your medical condition      (cold, fever, infections).     Do not wear jewelry, make-up, hairpins, clips or nail polish. Do not wear lotions, powders, or perfumes.  Do not shave body hair 48 hours prior to surgery. Men may shave face and neck. Do not bring valuables to the hospital.    [facility] is not responsible for any belongings or valuables.  Contacts, dentures/partials or body piercings may not be worn into surgery. Bring a case for your contacts, glasses or hearing aids, a denture cup will be supplied. Leave your suitcase in the car. After surgery it may be brought to your room. For patients admitted to the hospital, discharge time is determined by your treatment team.   Patients discharged the day of surgery will not be  allowed to drive home.   Please read over the following fact sheets that you were given:   MRSA Information, CHG soap  __X__ Take these medicines the morning of surgery with A SIP OF WATER:    1. cetirizine (ZYRTEC) 10 MG tablet  2. levothyroxine (SYNTHROID) 50 MCG tablet  3. metoprolol tartrate (LOPRESSOR) 25 MG tablet  4. valACYclovir (VALTREX) 1000 MG tablet  5.  6.  ____ Fleet Enema (as directed)   __X__ Use CHG Soap/SAGE wipes as directed  ____ Use inhalers on the day of surgery  ____ Stop metformin/Janumet/Farxiga 2 days prior to surgery    ____ Take 1/2 of usual insulin dose the night before surgery. No insulin the morning          of surgery.   __X__ Stop Blood Thinners... STOP YOUR BRILINTA AS INSTRUCTED ON 05/20/21 CONTINUE THE ASPIRIN BUT DO NOT TAKE IT THE DAY OF SURGERY 05/27/22 __X__ Stop Anti-inflammatories 7 days before surgery such as Advil, Ibuprofen, Motrin,  BC  or Goodies Powder, Naprosyn, Naproxen, Aleve   __X__ Stop all herbal supplements, fish oil or vitamin E until after surgery.    __X__ Bring C-Pap to the hospital.

## 2022-05-19 NOTE — Pre-Procedure Instructions (Signed)
Pt ok to continue aspirin per Dr Dahlia Byes. See notes. Atlee Abide NP aware and agreed pt should continue asa based on history.

## 2022-05-20 ENCOUNTER — Encounter: Payer: Self-pay | Admitting: Surgery

## 2022-05-20 DIAGNOSIS — J3089 Other allergic rhinitis: Secondary | ICD-10-CM | POA: Diagnosis not present

## 2022-05-20 DIAGNOSIS — J301 Allergic rhinitis due to pollen: Secondary | ICD-10-CM | POA: Diagnosis not present

## 2022-05-20 DIAGNOSIS — J3081 Allergic rhinitis due to animal (cat) (dog) hair and dander: Secondary | ICD-10-CM | POA: Diagnosis not present

## 2022-05-20 NOTE — Progress Notes (Signed)
Perioperative Services  Pre-Admission/Anesthesia Testing Clinical Review  Date: 05/20/22  Patient Demographics:  Name: Claudia Martin DOB:   10/16/1945 MRN:   [id number]  Planned Surgical Procedure(s):    Case: [id number] Date/Time: 05/27/22 0715   Procedures:      REPAIR ABDOMINAL WALL, reconstruction     HERNIA REPAIR VENTRAL ADULT, recurrent   Anesthesia type: General   Pre-op diagnosis: Recurrent ventral hernia 7-10 cm   Location: [facility] OR ROOM 07 / [facility]   Surgeons: Jules Husbands, MD   NOTE: Available PAT nursing documentation and vital signs have been reviewed. Clinical nursing staff has updated patient's PMH/PSHx, current medication list, and drug allergies/intolerances to ensure comprehensive history available to assist in medical decision making as it pertains to the aforementioned surgical procedure and anticipated anesthetic course. Extensive review of available clinical information performed. Claudia Martin PMH and PSHx updated with any diagnoses/procedures that  may have been inadvertently omitted during her intake with the pre-admission testing department's nursing staff.  Clinical Discussion:  Claudia Martin is a 76 y.o. female who is submitted for pre-surgical anesthesia review and clearance prior to her undergoing the above procedure. Patient has never been a smoker. Pertinent PMH includes: CAD, STEMI, diastolic dysfunction, cardiomegaly, aortic atherosclerosis, HTN, HLD, hypothyroidism, dyspnea, OSAH (requires nocturnal PAP therapy), GERD (no daily Tx), ventral hernia, OA.  Patient is followed by cardiology Clayborn Bigness, MD). She was last seen in the cardiology clinic on 05/06/2022; notes reviewed.  At this time of her clinic visit, patient reporting intermittent episodes of mild left-sided chest pain.  Pain is described as dull in quality and does not radiate.  She notes that episodes are self-limiting.  She denies any associated  shortness of breath, nausea, vomiting, diaphoresis, or pain in her neck/back/jaw.  Patient has not experienced any PND, orthopnea, palpitations, significant peripheral edema, vertiginous symptoms, or presyncope/syncope.  Patient with a past medical history significant for cardiovascular diagnoses.  Patient suffered a STEMI on 06/06/2021.  Diagnostic left heart catheterization at that time revealed a normal left ventricular systolic function with an EF of 55 to 65%.  There was 100% occlusion of the ramus intermedius.  PCI subsequently performed placing a 2.5 x 18 mm Onyx Frontier DES yielding excellent angiographic result and TIMI-3 flow.  TTE performed on 06/07/2021 revealed a normal left ventricular systolic function with EF 55 to 60%.  There were no regional wall motion abnormalities. Diastolic Doppler parameters consistent with abnormal relaxation (G1DD).  There was trivial mitral valve regurgitation noted.  Mild aortic valve sclerosis present with no evidence of stenosis.  Following STEMI with stent placement, patient remains on daily DAPT therapy (ASA + ticagrelor).  Patient reportedly compliant with therapy with no evidence or reports of GI bleeding.  Blood pressure reasonably controlled at 134/78 on currently prescribed ARB (losartan) and beta-blocker (metoprolol tartrate) therapies.  Patient is on rosuvastatin for her HLD diagnosis and further ASCVD prevention.  She has a supply of short acting nitrates (NTG) to use on a as needed basis; denied recent use.  Patient is not diabetic.  She does have an OSAH diagnosis and is reported to be compliant with prescribed nocturnal PAP therapy. Functional capacity, as defined by DASI, is documented as being >/= 4 METS.  No changes were made to his medication regimen.  Patient to follow-up with outpatient cardiology and 2 months or sooner if needed.  Claudia Martin is scheduled for an REPAIR ABDOMINAL WALL (RECONSTRUCTION); RECURRENT VENTRAL HERNIA  REPAIR on 05/27/2022 with Dr. Caroleen Hamman, MD. Given patient's past medical history significant for cardiovascular diagnoses, presurgical cardiac clearance was sought by the performing surgeon's office and PAT team. Per cardiology, "this patient is optimized for surgery and may proceed with the planned procedural course with a MODERATE risk of significant perioperative cardiovascular complications".  Again, this patient is on daily DAPT therapy.  She has been instructed on recommendations from her cardiologist for holding her ticagrelor for 7 days prior to procedure with plans to restart as soon as postoperative bleeding is felt to be minimized by her primary attending surgeon.  The patient is aware that her last dose of ticagrelor should be on 05/19/2022.  The patient will continue her daily low dose ASA throughout her perioperative course.  Patient denies previous perioperative complications with anesthesia in the past. In review of the available records, it is noted that patient underwent a MAC anesthetic course at [facility] (ASA III) in 12/2021 without documented complications.      05/19/2022    8:18 AM 05/05/2022   10:12 AM 04/14/2022   10:53 AM  Vitals with BMI  Height _0   _1   Weight 175 lbs 173 lbs 13 oz 174 lbs 6 oz  BMI 26.61 57.3 22.02  Systolic  542 706  Diastolic  76 78  Pulse  90 80    Providers/Specialists:   NOTE: Primary physician provider listed below. Patient may have been seen by APP or partner within same practice.   PROVIDER ROLE / SPECIALTY LAST Suszanne Finch, MD General Surgery (Surgeon) 05/05/2022  Jonetta Osgood, NP Primary Care Provider 03/21/2022  Katrine Coho, MD Cardiology 05/06/2022  Devona Konig, MD Pulmonary/Sleep Medicine 04/10/2022   Allergies:  Other, Augmentin [amoxicillin-pot clavulanate], Bactrim [sulfamethoxazole-trimethoprim], Doxycycline, Ibuprofen, Ivp dye [iodinated contrast media], Macrobid [nitrofurantoin], and  Prednisone  Current Home Medications:   No current facility-administered medications for this encounter.    aspirin 81 MG chewable tablet   BRILINTA 90 MG TABS tablet   cetirizine (ZYRTEC) 10 MG tablet   chlorhexidine (PERIDEX) 0.12 % solution   conjugated estrogens (PREMARIN) vaginal cream   diphenhydrAMINE (BENADRYL) 50 MG tablet   EPINEPHrine 0.3 mg/0.3 mL IJ SOAJ injection   levothyroxine (SYNTHROID) 50 MCG tablet   losartan (COZAAR) 25 MG tablet   magic mouthwash (nystatin, lidocaine, diphenhydrAMINE) suspension   metoprolol tartrate (LOPRESSOR) 25 MG tablet   nitroGLYCERIN (NITROSTAT) 0.4 MG SL tablet   predniSONE (DELTASONE) 50 MG tablet   rosuvastatin (CRESTOR) 5 MG tablet   valACYclovir (VALTREX) 1000 MG tablet   History:   Past Medical History:  Diagnosis Date   Allergic rhinitis    Arthritis    CAD (coronary artery disease) 06/06/2021   a.) STEMI --> LHC: EF 55-65%, 100% RI --> PCI placing a 2.5 x 18 mm Onxy Frontier DES   Cellulitis and abscess    Diverticulosis    Enlarged heart    Eyelid inflammation    GERD (gastroesophageal reflux disease)    Hematuria syndrome    Hemorrhoids    Herpes genitalis    Herpes simplex    HTN (hypertension)    Hydronephrosis    Hypothyroidism    Incomplete bladder emptying    Internal hordeolum    Long term current use of antithrombotics/antiplatelets    a.) on daily DAPT therapy (ASA + ticagrelor)   OSA on CPAP    Primary ovarian failure    Sciatica    Shortness of breath  Sinus bradycardia    STEMI (ST elevation myocardial infarction) ([place]) 06/06/2021   a.) LHC/PCI --> 100% RI (2.5 x 18 m Onyx Frontier DES)   Urethral diverticulum    Vaginal atrophy    Past Surgical History:  Procedure Laterality Date   BREAST BIOPSY     CATARACT EXTRACTION W/PHACO Right 06/05/2021   Procedure: CATARACT EXTRACTION PHACO AND INTRAOCULAR LENS PLACEMENT ([place]) RIGHT;  Surgeon: Leandrew Koyanagi, MD;  Location: [employer];  Service: Ophthalmology;  Laterality: Right;  3.02 00:39.5   CATARACT EXTRACTION W/PHACO Left 12/04/2021   Procedure: CATARACT EXTRACTION PHACO AND INTRAOCULAR LENS PLACEMENT ([place]) LEFT;  Surgeon: Leandrew Koyanagi, MD;  Location: [place];  Service: Ophthalmology;  Laterality: Left;  6.23 00:48.0   COLON SURGERY     COLONOSCOPY WITH PROPOFOL N/A 03/15/2018   Procedure: COLONOSCOPY WITH PROPOFOL;  Surgeon: Lin Landsman, MD;  Location: [facility] ENDOSCOPY;  Service: Gastroenterology;  Laterality: N/A;   COLONOSCOPY WITH PROPOFOL N/A 01/09/2021   Procedure: COLONOSCOPY WITH PROPOFOL;  Surgeon: Lin Landsman, MD;  Location: [facility] ENDOSCOPY;  Service: Gastroenterology;  Laterality: N/A;   CORONARY/GRAFT ACUTE MI REVASCULARIZATION N/A 06/06/2021   Procedure: Coronary/Graft Acute MI Revascularization;  Surgeon: Yolonda Kida, MD;  Location: [place] [facility];  Service: Cardiovascular;  Laterality: N/A;   ECTOPIC PREGNANCY SURGERY     fx thumb     HERNIA REPAIR     LEFT HEART CATH AND CORONARY ANGIOGRAPHY N/A 06/06/2021   Procedure: LEFT HEART CATH AND CORONARY ANGIOGRAPHY;  Surgeon: Yolonda Kida, MD;  Location: [place] [facility];  Service: Cardiovascular;  Laterality: N/A;   Family History  Problem Relation Age of Onset   Tuberculosis Mother    Nephrolithiasis Brother    Vision loss Daughter    Hypertension Daughter    Social History   Tobacco Use   Smoking status: Never    Passive exposure: Never   Smokeless tobacco: Never  Vaping Use   Vaping Use: Never used  Substance Use Topics   Alcohol use: No   Drug use: Never    Pertinent Clinical Results:  LABS: Labs reviewed: Acceptable for surgery.  No visits with results within 3 Day(s) from this visit.  Latest known visit with results is:  Hospital Outpatient Visit on 03/12/2022  Component Date Value Ref Range Status   Prothrombin Time 03/12/2022 13.1  11.4 - 15.2 seconds Final   INR  03/12/2022 1.0  0.8 - 1.2 Final   Comment: (NOTE) INR goal varies based on device and disease states. Performed at [facility], [address] [place], [place] [address]    Phosphorus 03/12/2022 3.3  2.5 - 4.6 mg/dL Final   Performed at [facility], [address] [place], [place] [address]   Magnesium 03/12/2022 2.1  1.7 - 2.4 mg/dL Final   Performed at [facility], [place], [place] [address]   Sodium 03/12/2022 140  135 - 145 mmol/L Final   Potassium 03/12/2022 3.5  3.5 - 5.1 mmol/L Final   Chloride 03/12/2022 109  98 - 111 mmol/L Final   CO2 03/12/2022 25  22 - 32 mmol/L Final   Glucose, Bld 03/12/2022 104 (H)  70 - 99 mg/dL Final   Glucose reference range applies only to samples taken after fasting for at least 8 hours.   BUN 03/12/2022 23  8 - 23 mg/dL Final   Creatinine, Ser 03/12/2022 0.83  0.44 - 1.00 mg/dL Final  Calcium 03/12/2022 8.9  8.9 - 10.3 mg/dL Final   Total Protein 03/12/2022 8.1  6.5 - 8.1 g/dL Final   Albumin 03/12/2022 3.7  3.5 - 5.0 g/dL Final   AST 03/12/2022 22  15 - 41 U/L Final   ALT 03/12/2022 12  0 - 44 U/L Final   Alkaline Phosphatase 03/12/2022 91  38 - 126 U/L Final   Total Bilirubin 03/12/2022 0.3  0.3 - 1.2 mg/dL Final   GFR, Estimated 03/12/2022 >60  >60 mL/min Final   Comment: (NOTE) Calculated using the CKD-EPI Creatinine Equation (2021)    Anion gap 03/12/2022 6  5 - 15 Final   Performed at [facility], [place], [place] [address]   WBC 03/12/2022 6.1  4.0 - 10.5 K/uL Final   RBC 03/12/2022 4.11  3.87 - 5.11 MIL/uL Final   Hemoglobin 03/12/2022 12.6  12.0 - 15.0 g/dL Final   HCT 03/12/2022 37.7  36.0 - 46.0 % Final   MCV 03/12/2022 91.7  80.0 - 100.0 fL Final   MCH 03/12/2022 30.7  26.0 - 34.0 pg Final   MCHC 03/12/2022 33.4  30.0 - 36.0 g/dL Final   RDW 03/12/2022 12.7  11.5 - 15.5 % Final   Platelets 03/12/2022 250  150 - 400 K/uL Final   nRBC 03/12/2022 0.0   0.0 - 0.2 % Final   Neutrophils Relative % 03/12/2022 60  % Final   Neutro Abs 03/12/2022 3.7  1.7 - 7.7 K/uL Final   Lymphocytes Relative 03/12/2022 27  % Final   Lymphs Abs 03/12/2022 1.6  0.7 - 4.0 K/uL Final   Monocytes Relative 03/12/2022 7  % Final   Monocytes Absolute 03/12/2022 0.4  0.1 - 1.0 K/uL Final   Eosinophils Relative 03/12/2022 5  % Final   Eosinophils Absolute 03/12/2022 0.3  0.0 - 0.5 K/uL Final   Basophils Relative 03/12/2022 1  % Final   Basophils Absolute 03/12/2022 0.0  0.0 - 0.1 K/uL Final   Immature Granulocytes 03/12/2022 0  % Final   Abs Immature Granulocytes 03/12/2022 0.02  0.00 - 0.07 K/uL Final   Performed at [facility], [address] [place], [place] [address]    ECG: Date: 05/19/2022 Time ECG obtained: 1112 AM Rate: 69 bpm Rhythm: normal sinus; RBBB Axis (leads I and aVF): Normal Intervals: PR 172 ms. QRS 122 ms. QTc 497 ms. ST segment and T wave changes: No evidence of acute ST segment elevation or depression.  Evidence of an age undetermined lateral infarct present. Comparison: Similar to previous tracing obtained on 06/06/2021   IMAGING / PROCEDURES: CT ABDOMEN PELVIS W CONTRAST performed on 04/28/2022 Broad-based midline upper abdominal ventral hernia containing a segment of transverse colon, with decreased inflammatory changes along the right lateral margin of the hernia sac. No fluid collection or abscess is identified. No incarceration or obstruction. 2 other midline ventral hernias containing portions of small bowel as above, unchanged since prior exam. No incarceration or obstruction. Aortic atherosclerosis Indeterminate partially calcified hypodense lesion within the pancreatic tail, compatible with benign etiology given stability since 2011.  TRANSTHORACIC ECHOCARDIOGRAM performed on 06/07/2021 Left ventricular ejection fraction, by estimation, is 55 to 60%. The left ventricle has normal function. The left ventricle has no  regional wall motion abnormalities. Left ventricular diastolic parameters are consistent with Grade I diastolic dysfunction (impaired relaxation).  Right ventricular systolic function is normal. The right ventricular size is normal.  The mitral valve is degenerative. Trivial mitral valve regurgitation.  No evidence of mitral stenosis. The aortic valve is normal in structure. Aortic valve regurgitation is not visualized. Mild aortic valve sclerosis is present, with no evidence of aortic valve stenosis.  The inferior vena cava is normal in size with greater than 50% respiratory variability, suggesting right atrial pressure of 3 mmHg.  LEFT HEART CATHETERIZATION AND CORONARY ANGIOGRAPHY performed on 06/06/2021 Normal left ventricular systolic function with EF of 55 to 65% Mildly elevated LVEDP 1% lesion of the ramus intermedius Successful PCI placing a 2.5 x 18 mm Onyx Frontier DES yielding excellent angiographic result and TIMI-3 flow   Impression and Plan:  Claudia Martin has been referred for pre-anesthesia review and clearance prior to her undergoing the planned anesthetic and procedural courses. Available labs, pertinent testing, and imaging results were personally reviewed by me. This patient has been appropriately cleared by cardiology with an overall MODERATE risk of significant perioperative cardiovascular complications.  Based on clinical review performed today (05/20/22), barring any significant acute changes in the patient's overall condition, it is anticipated that she will be able to proceed with the planned surgical intervention. Any acute changes in clinical condition may necessitate her procedure being postponed and/or cancelled. Patient will meet with anesthesia team (MD and/or CRNA) on the day of her procedure for preoperative evaluation/assessment. Questions regarding anesthetic course will be fielded at that time.   Pre-surgical instructions were reviewed with the patient  during her PAT appointment and questions were fielded by PAT clinical staff. Patient was advised that if any questions or concerns arise prior to her procedure then she should return a call to PAT and/or her surgeon's office to discuss.  Honor Loh, MSN, APRN, FNP-C, CEN [facility]  Peri-operative Services Nurse Practitioner Phone: [phone] Fax: [phone] 05/20/22 10:31 AM  NOTE: This note has been prepared using Dragon dictation software. Despite my best ability to proofread, there is always the potential that unintentional transcriptional errors may still occur from this process.

## 2022-05-27 ENCOUNTER — Inpatient Hospital Stay
Admission: RE | Admit: 2022-05-27 | Discharge: 2022-06-02 | DRG: 336 | Disposition: A | Discharge status: Home Health | Payer: Medicare Other | Attending: Surgery | Admitting: Surgery

## 2022-05-27 ENCOUNTER — Inpatient Hospital Stay: Payer: Medicare Other | Admitting: Urgent Care

## 2022-05-27 ENCOUNTER — Other Ambulatory Visit: Payer: Self-pay

## 2022-05-27 ENCOUNTER — Encounter
Admission: RE | Disposition: A | Discharge status: Home Health | Payer: Self-pay | Source: Home / Self Care | Attending: Surgery

## 2022-05-27 ENCOUNTER — Encounter: Payer: Self-pay | Admitting: Surgery

## 2022-05-27 DIAGNOSIS — L723 Sebaceous cyst: Secondary | ICD-10-CM | POA: Diagnosis not present

## 2022-05-27 DIAGNOSIS — Z888 Allergy status to other drugs, medicaments and biological substances status: Secondary | ICD-10-CM | POA: Diagnosis not present

## 2022-05-27 DIAGNOSIS — K432 Incisional hernia without obstruction or gangrene: Secondary | ICD-10-CM | POA: Diagnosis not present

## 2022-05-27 DIAGNOSIS — N179 Acute kidney failure, unspecified: Secondary | ICD-10-CM | POA: Diagnosis not present

## 2022-05-27 DIAGNOSIS — Z831 Family history of other infectious and parasitic diseases: Secondary | ICD-10-CM | POA: Diagnosis not present

## 2022-05-27 DIAGNOSIS — I2510 Atherosclerotic heart disease of native coronary artery without angina pectoris: Secondary | ICD-10-CM | POA: Diagnosis present

## 2022-05-27 DIAGNOSIS — E878 Other disorders of electrolyte and fluid balance, not elsewhere classified: Secondary | ICD-10-CM | POA: Diagnosis not present

## 2022-05-27 DIAGNOSIS — I252 Old myocardial infarction: Secondary | ICD-10-CM

## 2022-05-27 DIAGNOSIS — Z7902 Long term (current) use of antithrombotics/antiplatelets: Secondary | ICD-10-CM

## 2022-05-27 DIAGNOSIS — Z9841 Cataract extraction status, right eye: Secondary | ICD-10-CM

## 2022-05-27 DIAGNOSIS — E669 Obesity, unspecified: Secondary | ICD-10-CM | POA: Diagnosis present

## 2022-05-27 DIAGNOSIS — K439 Ventral hernia without obstruction or gangrene: Secondary | ICD-10-CM | POA: Diagnosis not present

## 2022-05-27 DIAGNOSIS — K567 Ileus, unspecified: Secondary | ICD-10-CM | POA: Diagnosis not present

## 2022-05-27 DIAGNOSIS — Z961 Presence of intraocular lens: Secondary | ICD-10-CM | POA: Diagnosis not present

## 2022-05-27 DIAGNOSIS — Z9889 Other specified postprocedural states: Secondary | ICD-10-CM | POA: Diagnosis present

## 2022-05-27 DIAGNOSIS — Z955 Presence of coronary angioplasty implant and graft: Secondary | ICD-10-CM

## 2022-05-27 DIAGNOSIS — K219 Gastro-esophageal reflux disease without esophagitis: Secondary | ICD-10-CM | POA: Diagnosis not present

## 2022-05-27 DIAGNOSIS — I10 Essential (primary) hypertension: Secondary | ICD-10-CM | POA: Diagnosis not present

## 2022-05-27 DIAGNOSIS — Z8249 Family history of ischemic heart disease and other diseases of the circulatory system: Secondary | ICD-10-CM

## 2022-05-27 DIAGNOSIS — Z6836 Body mass index (BMI) 36.0-36.9, adult: Secondary | ICD-10-CM

## 2022-05-27 DIAGNOSIS — Z821 Family history of blindness and visual loss: Secondary | ICD-10-CM

## 2022-05-27 DIAGNOSIS — K3189 Other diseases of stomach and duodenum: Secondary | ICD-10-CM | POA: Diagnosis not present

## 2022-05-27 DIAGNOSIS — Z8719 Personal history of other diseases of the digestive system: Principal | ICD-10-CM

## 2022-05-27 DIAGNOSIS — K430 Incisional hernia with obstruction, without gangrene: Secondary | ICD-10-CM | POA: Diagnosis not present

## 2022-05-27 DIAGNOSIS — E039 Hypothyroidism, unspecified: Secondary | ICD-10-CM | POA: Diagnosis not present

## 2022-05-27 DIAGNOSIS — D72829 Elevated white blood cell count, unspecified: Secondary | ICD-10-CM | POA: Diagnosis not present

## 2022-05-27 DIAGNOSIS — K6389 Other specified diseases of intestine: Secondary | ICD-10-CM | POA: Diagnosis not present

## 2022-05-27 DIAGNOSIS — Z9842 Cataract extraction status, left eye: Secondary | ICD-10-CM | POA: Diagnosis not present

## 2022-05-27 DIAGNOSIS — K660 Peritoneal adhesions (postprocedural) (postinfection): Secondary | ICD-10-CM | POA: Diagnosis not present

## 2022-05-27 DIAGNOSIS — K9189 Other postprocedural complications and disorders of digestive system: Secondary | ICD-10-CM | POA: Diagnosis not present

## 2022-05-27 HISTORY — DX: Atherosclerosis of aorta: I70.0

## 2022-05-27 HISTORY — DX: Long term (current) use of antithrombotics/antiplatelets: Z79.02

## 2022-05-27 HISTORY — PX: INSERTION OF MESH: SHX5868

## 2022-05-27 HISTORY — PX: ABDOMINAL WALL DEFECT REPAIR: SHX53

## 2022-05-27 HISTORY — DX: Obstructive sleep apnea (adult) (pediatric): G47.33

## 2022-05-27 HISTORY — PX: VENTRAL HERNIA REPAIR: SHX424

## 2022-05-27 LAB — CBC
HCT: 38.2 % (ref 36.0–46.0)
Hemoglobin: 12.9 g/dL (ref 12.0–15.0)
MCH: 31.1 pg (ref 26.0–34.0)
MCHC: 33.8 g/dL (ref 30.0–36.0)
MCV: 92 fL (ref 80.0–100.0)
Platelets: 158 10*3/uL (ref 150–400)
RBC: 4.15 MIL/uL (ref 3.87–5.11)
RDW: 13.1 % (ref 11.5–15.5)
WBC: 11.6 10*3/uL — ABNORMAL HIGH (ref 4.0–10.5)
nRBC: 0 % (ref 0.0–0.2)

## 2022-05-27 LAB — CREATININE, SERUM
Creatinine, Ser: 0.68 mg/dL (ref 0.44–1.00)
GFR, Estimated: >60 mL/min (ref >60)

## 2022-05-27 SURGERY — REPAIR, ABDOMINAL WALL
Anesthesia: General | Site: Abdomen

## 2022-05-27 MED ORDER — FENTANYL CITRATE (PF) 100 MCG/2ML IJ SOLN
25.0000 ug | INTRAMUSCULAR | Status: DC | PRN
  Administered 2022-05-27 (×2): 25 ug via INTRAVENOUS

## 2022-05-27 MED ORDER — HEPARIN SODIUM (PORCINE) 5000 UNIT/ML IJ SOLN
5000.0000 [IU] | Freq: Three times a day (TID) | INTRAMUSCULAR | Status: DC
  Administered 2022-05-28 – 2022-05-29 (×3): 5000 [IU] via SUBCUTANEOUS
  Filled 2022-05-27 (×3): qty 1

## 2022-05-27 MED ORDER — CEFAZOLIN SODIUM-DEXTROSE 2-4 GM/100ML-% IV SOLN
2.0000 g | Freq: Three times a day (TID) | INTRAVENOUS | Status: AC
  Administered 2022-05-27 – 2022-05-28 (×3): 2 g via INTRAVENOUS
  Filled 2022-05-27 (×3): qty 100

## 2022-05-27 MED ORDER — PROPOFOL 10 MG/ML IV BOLUS
INTRAVENOUS | Status: DC | PRN
  Administered 2022-05-27: 30 mg via INTRAVENOUS
  Administered 2022-05-27: 120 mg via INTRAVENOUS

## 2022-05-27 MED ORDER — DROPERIDOL 2.5 MG/ML IJ SOLN: 0.6250 mg | Freq: Once | INTRAMUSCULAR | Status: DC | PRN

## 2022-05-27 MED ORDER — GABAPENTIN 300 MG PO CAPS
ORAL_CAPSULE | ORAL | Status: AC
  Administered 2022-05-27: 300 mg via ORAL
  Filled 2022-05-27: qty 1

## 2022-05-27 MED ORDER — PANTOPRAZOLE SODIUM 40 MG IV SOLR
40.0000 mg | Freq: Every day | INTRAVENOUS | Status: DC
  Administered 2022-05-27 – 2022-05-31 (×5): 40 mg via INTRAVENOUS
  Filled 2022-05-27 (×5): qty 10

## 2022-05-27 MED ORDER — CELECOXIB 200 MG PO CAPS: 200.0000 mg | ORAL_CAPSULE | ORAL | Status: AC

## 2022-05-27 MED ORDER — FAMOTIDINE 20 MG PO TABS: 20.0000 mg | ORAL_TABLET | Freq: Once | ORAL | Status: AC

## 2022-05-27 MED ORDER — VISTASEAL 10 ML SINGLE DOSE KIT
PACK | CUTANEOUS | Status: DC | PRN
  Administered 2022-05-27: 10 mL via TOPICAL

## 2022-05-27 MED ORDER — DEXTROSE 5 % IV SOLN
500.0000 mg | Freq: Three times a day (TID) | INTRAVENOUS | Status: DC | PRN
Start: 2022-05-27 — End: 2022-05-30
  Filled 2022-05-27: qty 5

## 2022-05-27 MED ORDER — DEXAMETHASONE SODIUM PHOSPHATE 10 MG/ML IJ SOLN
INTRAMUSCULAR | Status: DC | PRN
  Administered 2022-05-27: 10 mg via INTRAVENOUS

## 2022-05-27 MED ORDER — FAMOTIDINE 20 MG PO TABS
ORAL_TABLET | ORAL | Status: AC
  Administered 2022-05-27: 20 mg via ORAL
  Filled 2022-05-27: qty 1

## 2022-05-27 MED ORDER — SODIUM CHLORIDE 0.9 % IV SOLN: INTRAVENOUS | Status: DC

## 2022-05-27 MED ORDER — ACETAMINOPHEN 500 MG PO TABS
1000.0000 mg | ORAL_TABLET | Freq: Four times a day (QID) | ORAL | Status: DC
  Administered 2022-05-27 – 2022-06-02 (×23): 1000 mg via ORAL
  Filled 2022-05-27 (×23): qty 2

## 2022-05-27 MED ORDER — DIPHENHYDRAMINE HCL 50 MG/ML IJ SOLN: 12.5000 mg | Freq: Four times a day (QID) | INTRAMUSCULAR | Status: DC | PRN

## 2022-05-27 MED ORDER — SODIUM CHLORIDE (PF) 0.9 % IJ SOLN
INTRAMUSCULAR | Status: AC
  Filled 2022-05-27: qty 20

## 2022-05-27 MED ORDER — MELATONIN 5 MG PO TABS
5.0000 mg | ORAL_TABLET | Freq: Every evening | ORAL | Status: DC | PRN
  Administered 2022-05-27: 5 mg via ORAL
  Filled 2022-05-27: qty 1

## 2022-05-27 MED ORDER — ACETAMINOPHEN 500 MG PO TABS: 1000.0000 mg | ORAL_TABLET | ORAL | Status: AC

## 2022-05-27 MED ORDER — CEFAZOLIN SODIUM 1 G IJ SOLR
INTRAMUSCULAR | Status: AC
Start: 2022-05-27 — End: ?
  Filled 2022-05-27: qty 20

## 2022-05-27 MED ORDER — KETAMINE HCL 50 MG/5ML IJ SOSY
PREFILLED_SYRINGE | INTRAMUSCULAR | Status: AC
  Filled 2022-05-27: qty 5

## 2022-05-27 MED ORDER — CHLORHEXIDINE GLUCONATE 0.12 % MT SOLN
OROMUCOSAL | Status: AC
  Administered 2022-05-27: 15 mL via OROMUCOSAL
  Filled 2022-05-27: qty 15

## 2022-05-27 MED ORDER — OXYCODONE HCL 5 MG PO TABS: 5.0000 mg | ORAL_TABLET | Freq: Once | ORAL | Status: DC | PRN

## 2022-05-27 MED ORDER — DIPHENHYDRAMINE HCL 12.5 MG/5ML PO ELIX: 12.5000 mg | ORAL_SOLUTION | Freq: Four times a day (QID) | ORAL | Status: DC | PRN

## 2022-05-27 MED ORDER — CELECOXIB 200 MG PO CAPS
ORAL_CAPSULE | ORAL | Status: AC
  Administered 2022-05-27: 200 mg via ORAL
  Filled 2022-05-27: qty 1

## 2022-05-27 MED ORDER — FENTANYL CITRATE (PF) 100 MCG/2ML IJ SOLN
INTRAMUSCULAR | Status: DC | PRN
  Administered 2022-05-27 (×2): 50 ug via INTRAVENOUS

## 2022-05-27 MED ORDER — SEPRAFILM FOR OPTIME
ORAL_FILM | TOPICAL | Status: DC | PRN
  Administered 2022-05-27: 10 via TOPICAL

## 2022-05-27 MED ORDER — ACETAMINOPHEN 10 MG/ML IV SOLN
INTRAVENOUS | Status: AC
  Filled 2022-05-27: qty 100

## 2022-05-27 MED ORDER — PROMETHAZINE HCL 25 MG/ML IJ SOLN: 6.2500 mg | INTRAMUSCULAR | Status: DC | PRN

## 2022-05-27 MED ORDER — LEVOTHYROXINE SODIUM 25 MCG PO TABS
25.0000 ug | ORAL_TABLET | Freq: Every day | ORAL | Status: DC
  Administered 2022-05-28 – 2022-06-02 (×6): 25 ug via ORAL
  Filled 2022-05-27 (×6): qty 1

## 2022-05-27 MED ORDER — LIDOCAINE HCL (CARDIAC) PF 100 MG/5ML IV SOSY
PREFILLED_SYRINGE | INTRAVENOUS | Status: DC | PRN
  Administered 2022-05-27: 50 mg via INTRAVENOUS

## 2022-05-27 MED ORDER — CEFAZOLIN SODIUM-DEXTROSE 2-4 GM/100ML-% IV SOLN
INTRAVENOUS | Status: AC
  Filled 2022-05-27: qty 100

## 2022-05-27 MED ORDER — ONDANSETRON 4 MG PO TBDP: 4.0000 mg | ORAL_TABLET | Freq: Four times a day (QID) | ORAL | Status: DC | PRN

## 2022-05-27 MED ORDER — ONDANSETRON HCL 4 MG/2ML IJ SOLN
INTRAMUSCULAR | Status: DC | PRN
  Administered 2022-05-27: 4 mg via INTRAVENOUS

## 2022-05-27 MED ORDER — OXYCODONE HCL 5 MG/5ML PO SOLN: 5.0000 mg | Freq: Once | ORAL | Status: DC | PRN

## 2022-05-27 MED ORDER — METOPROLOL TARTRATE 25 MG PO TABS
25.0000 mg | ORAL_TABLET | Freq: Two times a day (BID) | ORAL | Status: DC
  Administered 2022-05-27 – 2022-05-28 (×2): 25 mg via ORAL
  Filled 2022-05-27 (×2): qty 1

## 2022-05-27 MED ORDER — BUPIVACAINE LIPOSOME 1.3 % IJ SUSP
INTRAMUSCULAR | Status: AC
  Filled 2022-05-27: qty 20

## 2022-05-27 MED ORDER — GLYCOPYRROLATE 0.2 MG/ML IJ SOLN
INTRAMUSCULAR | Status: DC | PRN
  Administered 2022-05-27: .2 mg via INTRAVENOUS

## 2022-05-27 MED ORDER — LACTATED RINGERS IV SOLN: INTRAVENOUS | Status: DC | PRN

## 2022-05-27 MED ORDER — GABAPENTIN 300 MG PO CAPS: 300.0000 mg | ORAL_CAPSULE | ORAL | Status: AC

## 2022-05-27 MED ORDER — PREGABALIN 50 MG PO CAPS
50.0000 mg | ORAL_CAPSULE | Freq: Three times a day (TID) | ORAL | Status: DC
  Administered 2022-05-28 (×3): 50 mg via ORAL
  Filled 2022-05-27 (×4): qty 1

## 2022-05-27 MED ORDER — CEFAZOLIN SODIUM-DEXTROSE 2-4 GM/100ML-% IV SOLN
2.0000 g | INTRAVENOUS | Status: AC
  Administered 2022-05-27 (×2): 2 g via INTRAVENOUS

## 2022-05-27 MED ORDER — OXYCODONE HCL 5 MG PO TABS
5.0000 mg | ORAL_TABLET | ORAL | Status: DC | PRN
  Administered 2022-05-27 (×2): 5 mg via ORAL
  Administered 2022-05-28: 10 mg via ORAL
  Administered 2022-06-01 – 2022-06-02 (×2): 5 mg via ORAL
  Filled 2022-05-27: qty 2
  Filled 2022-05-27 (×4): qty 1

## 2022-05-27 MED ORDER — PROPOFOL 10 MG/ML IV BOLUS
INTRAVENOUS | Status: AC
  Filled 2022-05-27: qty 20

## 2022-05-27 MED ORDER — FENTANYL CITRATE (PF) 100 MCG/2ML IJ SOLN
INTRAMUSCULAR | Status: AC
  Filled 2022-05-27: qty 2

## 2022-05-27 MED ORDER — ORAL CARE MOUTH RINSE: 15.0000 mL | Freq: Once | OROMUCOSAL | Status: AC

## 2022-05-27 MED ORDER — ACETAMINOPHEN 10 MG/ML IV SOLN: 1000.0000 mg | Freq: Once | INTRAVENOUS | Status: DC | PRN

## 2022-05-27 MED ORDER — ROCURONIUM BROMIDE 100 MG/10ML IV SOLN
INTRAVENOUS | Status: DC | PRN
  Administered 2022-05-27: 10 mg via INTRAVENOUS
  Administered 2022-05-27 (×3): 20 mg via INTRAVENOUS
  Administered 2022-05-27: 50 mg via INTRAVENOUS
  Administered 2022-05-27: 20 mg via INTRAVENOUS

## 2022-05-27 MED ORDER — PHENYLEPHRINE 80 MCG/ML (10ML) SYRINGE FOR IV PUSH (FOR BLOOD PRESSURE SUPPORT)
PREFILLED_SYRINGE | INTRAVENOUS | Status: DC | PRN
  Administered 2022-05-27 (×3): 80 ug via INTRAVENOUS

## 2022-05-27 MED ORDER — DEXMEDETOMIDINE (PRECEDEX) IN NS 20 MCG/5ML (4 MCG/ML) IV SYRINGE
PREFILLED_SYRINGE | INTRAVENOUS | Status: DC | PRN
  Administered 2022-05-27 (×4): 4 ug via INTRAVENOUS
  Administered 2022-05-27: 8 ug via INTRAVENOUS
  Administered 2022-05-27 (×2): 4 ug via INTRAVENOUS

## 2022-05-27 MED ORDER — BUPIVACAINE HCL (PF) 0.25 % IJ SOLN
INTRAMUSCULAR | Status: AC
  Filled 2022-05-27: qty 30

## 2022-05-27 MED ORDER — CHLORHEXIDINE GLUCONATE CLOTH 2 % EX PADS
6.0000 | MEDICATED_PAD | Freq: Once | CUTANEOUS | Status: AC
  Administered 2022-05-27: 6 via TOPICAL

## 2022-05-27 MED ORDER — ACETAMINOPHEN 500 MG PO TABS
ORAL_TABLET | ORAL | Status: AC
  Administered 2022-05-27: 1000 mg via ORAL
  Filled 2022-05-27: qty 2

## 2022-05-27 MED ORDER — LACTATED RINGERS IV SOLN: INTRAVENOUS | Status: DC

## 2022-05-27 MED ORDER — FENTANYL CITRATE (PF) 100 MCG/2ML IJ SOLN
INTRAMUSCULAR | Status: AC
  Administered 2022-05-27: 25 ug via INTRAVENOUS
  Filled 2022-05-27: qty 2

## 2022-05-27 MED ORDER — KETAMINE HCL 10 MG/ML IJ SOLN
INTRAMUSCULAR | Status: DC | PRN
  Administered 2022-05-27: 20 mg via INTRAVENOUS
  Administered 2022-05-27: 10 mg via INTRAVENOUS
  Administered 2022-05-27: 20 mg via INTRAVENOUS
  Administered 2022-05-27: 10 mg via INTRAVENOUS
  Administered 2022-05-27: 30 mg via INTRAVENOUS
  Administered 2022-05-27: 10 mg via INTRAVENOUS

## 2022-05-27 MED ORDER — MORPHINE SULFATE (PF) 4 MG/ML IV SOLN
4.0000 mg | INTRAVENOUS | Status: DC | PRN
  Administered 2022-05-27 – 2022-05-28 (×2): 4 mg via INTRAVENOUS
  Filled 2022-05-27 (×2): qty 1

## 2022-05-27 MED ORDER — CHLORHEXIDINE GLUCONATE 0.12 % MT SOLN: 15.0000 mL | Freq: Once | OROMUCOSAL | Status: AC

## 2022-05-27 MED ORDER — SUGAMMADEX SODIUM 200 MG/2ML IV SOLN
INTRAVENOUS | Status: DC | PRN
  Administered 2022-05-27: 200 mg via INTRAVENOUS

## 2022-05-27 MED ORDER — SODIUM CHLORIDE (PF) 0.9 % IJ SOLN
INTRAMUSCULAR | Status: DC | PRN
  Administered 2022-05-27: 100 mL

## 2022-05-27 MED ORDER — METHOCARBAMOL 500 MG PO TABS: 500.0000 mg | ORAL_TABLET | Freq: Three times a day (TID) | ORAL | Status: DC | PRN

## 2022-05-27 MED ORDER — ONDANSETRON HCL 4 MG/2ML IJ SOLN
4.0000 mg | Freq: Four times a day (QID) | INTRAMUSCULAR | Status: DC | PRN
  Administered 2022-05-27 – 2022-05-28 (×3): 4 mg via INTRAVENOUS
  Filled 2022-05-27 (×3): qty 2

## 2022-05-27 MED ORDER — SODIUM CHLORIDE 0.9 % IV SOLN
INTRAVENOUS | Status: DC | PRN
  Administered 2022-05-27: 100 mL

## 2022-05-27 MED ORDER — ACETAMINOPHEN 10 MG/ML IV SOLN
INTRAVENOUS | Status: DC | PRN
  Administered 2022-05-27: 1000 mg via INTRAVENOUS

## 2022-05-27 MED ORDER — VISTASEAL 10 ML SINGLE DOSE KIT
PACK | CUTANEOUS | Status: AC
  Filled 2022-05-27: qty 10

## 2022-05-27 MED ORDER — SODIUM CHLORIDE (PF) 0.9 % IJ SOLN
INTRAMUSCULAR | Status: AC
  Filled 2022-05-27: qty 50

## 2022-05-27 SURGICAL SUPPLY — 48 items
BAG DECANTER FOR FLEXI CONT (MISCELLANEOUS) IMPLANT
BARRIER ADH SEPRAFILM 3INX5IN (MISCELLANEOUS) IMPLANT
BLADE CLIPPER SURG (BLADE) ×1 IMPLANT
BULB RESERV EVAC DRAIN JP 100C (MISCELLANEOUS) IMPLANT
CANISTER WOUND CARE 500ML ATS (WOUND CARE) IMPLANT
CHLORAPREP W/TINT 26 (MISCELLANEOUS) ×1 IMPLANT
DRAIN CHANNEL JP 19F (MISCELLANEOUS) IMPLANT
DRAPE LAPAROTOMY 100X77 ABD (DRAPES) ×1 IMPLANT
DRESSING PREVENA PLUS CUSTOM (GAUZE/BANDAGES/DRESSINGS) IMPLANT
DRSG PREVENA PLUS CUSTOM (GAUZE/BANDAGES/DRESSINGS) ×1
DRSG TEGADERM 4X4.75 (GAUZE/BANDAGES/DRESSINGS) IMPLANT
ELECT BLADE 6.5 EXT (BLADE) ×1 IMPLANT
ELECT CAUTERY BLADE 6.4 (BLADE) ×1 IMPLANT
ELECT REM PT RETURN 9FT ADLT (ELECTROSURGICAL) ×1
ELECTRODE REM PT RTRN 9FT ADLT (ELECTROSURGICAL) ×1 IMPLANT
GAUZE 4X4 16PLY ~~LOC~~+RFID DBL (SPONGE) ×1 IMPLANT
GLOVE BIO SURGEON STRL SZ7 (GLOVE) ×2 IMPLANT
GOWN STRL REUS W/ TWL LRG LVL3 (GOWN DISPOSABLE) ×3 IMPLANT
GOWN STRL REUS W/TWL LRG LVL3 (GOWN DISPOSABLE) ×4
IV NS 1000ML (IV SOLUTION) ×2
IV NS 1000ML BAXH (IV SOLUTION) IMPLANT
KIT TURNOVER KIT A (KITS) ×1 IMPLANT
MANIFOLD NEPTUNE II (INSTRUMENTS) ×1 IMPLANT
MESH SOFT 12X12IN BARD (Mesh General) IMPLANT
NDL HYPO 25X1 1.5 SAFETY (NEEDLE) ×1 IMPLANT
NEEDLE HYPO 22GX1.5 SAFETY (NEEDLE) ×1 IMPLANT
NEEDLE HYPO 25X1 1.5 SAFETY (NEEDLE) ×1 IMPLANT
NS IRRIG 1000ML POUR BTL (IV SOLUTION) ×1 IMPLANT
PACK BASIN MAJOR ARMC (MISCELLANEOUS) ×1 IMPLANT
PACK BASIN MINOR ARMC (MISCELLANEOUS) ×1 IMPLANT
SPONGE DRAIN TRACH 4X4 STRL 2S (GAUZE/BANDAGES/DRESSINGS) IMPLANT
SPONGE KITTNER 5P (MISCELLANEOUS) ×1 IMPLANT
SPONGE T-LAP 18X18 ~~LOC~~+RFID (SPONGE) ×2 IMPLANT
STAPLER SKIN PROX 35W (STAPLE) ×1 IMPLANT
SUT ETHILON 3-0 FS-10 30 BLK (SUTURE) ×2
SUT PDS AB 0 CT1 27 (SUTURE) ×3 IMPLANT
SUT SILK 2 0 (SUTURE) ×1
SUT SILK 2 0 SH CR/8 (SUTURE) IMPLANT
SUT SILK 2-0 18XBRD TIE 12 (SUTURE) ×1 IMPLANT
SUT VIC AB 2-0 SH 27 (SUTURE) ×1
SUT VIC AB 2-0 SH 27XBRD (SUTURE) ×2 IMPLANT
SUT VIC AB 3-0 SH 27 (SUTURE) ×4
SUT VIC AB 3-0 SH 27X BRD (SUTURE) IMPLANT
SUTURE EHLN 3-0 FS-10 30 BLK (SUTURE) IMPLANT
SYR 20ML LL LF (SYRINGE) ×2 IMPLANT
TAPE MICROFOAM 4IN (TAPE) ×1 IMPLANT
TRAP FLUID SMOKE EVACUATOR (MISCELLANEOUS) ×1 IMPLANT
TRAY FOLEY SLVR 16FR LF STAT (SET/KITS/TRAYS/PACK) ×1 IMPLANT

## 2022-05-27 NOTE — Transfer of Care (Signed)
Immediate Anesthesia Transfer of Care Note  Patient: Claudia Martin  Procedure(s) Performed: REPAIR ABDOMINAL WALL, reconstruction (Abdomen) HERNIA REPAIR VENTRAL ADULT, recurrent (Abdomen) INSERTION OF MESH (Abdomen)  Patient Location: PACU  Anesthesia Type:General  Level of Consciousness: drowsy  Airway & Oxygen Therapy: Patient Spontanous Breathing and Patient connected to face mask oxygen  Post-op Assessment: Report given to RN and Post -op Vital signs reviewed and stable  Post vital signs: Reviewed and stable  Last Vitals:  Vitals Value Taken Time  BP 134/63 05/27/22 1545  Temp 36 C 05/27/22 1536  Pulse 52 05/27/22 1549  Resp 19 05/27/22 1549  SpO2 99 % 05/27/22 1549  Vitals shown include unvalidated device data.  Last Pain:  Vitals:   05/27/22 1536  TempSrc:   PainSc: Asleep         Complications: No notable events documented.

## 2022-05-27 NOTE — Progress Notes (Signed)
   05/27/22 1700  Incentive Spirometry  IS Goal (mL) (RN or RT) 1000 mL  IS - Achieved (mL) (RN, NT, or RT) 750 mL  IS - # of Times (RN or NT) 6  IS Effort (RN) Satisfactory   Education provided, pt verbalized understanding and returned demonstration

## 2022-05-27 NOTE — Interval H&P Note (Signed)
History and Physical Interval Note:  05/27/2022 8:40 AM  Claudia Martin  has presented today for surgery, with the diagnosis of Recurrent ventral hernia 7-10 cm.  The various methods of treatment have been discussed with the patient and family. After consideration of risks, benefits and other options for treatment, the patient has consented to  Procedure(s): REPAIR ABDOMINAL WALL, reconstruction (N/A) HERNIA REPAIR VENTRAL ADULT, recurrent (N/A) as a surgical intervention.  The patient's history has been reviewed, patient examined, no change in status, stable for surgery.  I have reviewed the patient's chart and labs.  Questions were answered to the patient's satisfaction.     [place]

## 2022-05-27 NOTE — Anesthesia Preprocedure Evaluation (Addendum)
Anesthesia Evaluation  Patient identified by MRN, date of birth, ID band Patient awake    Reviewed: Allergy & Precautions, NPO status , Patient's Chart, lab work & pertinent test results, reviewed documented beta blocker date and time   History of Anesthesia Complications Negative for: history of anesthetic complications  Airway Mallampati: III  TM Distance: >3 FB Neck ROM: Limited    Dental  (+)    Pulmonary sleep apnea ,    breath sounds clear to auscultation       Cardiovascular Exercise Tolerance: Poor hypertension, Pt. on home beta blockers and Pt. on medications (-) angina+ CAD, + Past MI and + Cardiac Stents (Stent 06/2021, on Brilinta)  (-) DOE  Rhythm:Regular Rate:Normal   TTE (07/2021): LVEF 55%, mild LVH Trivial TR, mild MR  Patient reporting intermittent episodes of mild left-sided chest pain.  Pain is described as dull in quality and does not radiate.  She notes that episodes are self-limiting.  She denies any associated shortness of breath, nausea, vomiting, diaphoresis, or pain in her neck/back/jaw   Neuro/Psych  Neuromuscular disease (Sciatica)    GI/Hepatic GERD  , Diverticulosis   Endo/Other  Hypothyroidism Morbid obesity  Renal/GU Renal disease     Musculoskeletal  (+) Arthritis ,   Abdominal (+) + obese (BMI 41),   Peds  Hematology   Anesthesia Other Findings   Reproductive/Obstetrics                            Anesthesia Physical  Anesthesia Plan  ASA: 3  Anesthesia Plan: General   Post-op Pain Management: Tylenol PO (pre-op)*, Celebrex PO (pre-op)*, Gabapentin PO (pre-op)* and Regional block*   Induction: Intravenous  PONV Risk Score and Plan: 2 and Treatment may vary due to age or medical condition, Dexamethasone and Ondansetron  Airway Management Planned: Oral ETT  Additional Equipment:   Intra-op Plan:   Post-operative Plan: Extubation in  OR  Informed Consent: I have reviewed the patients History and Physical, chart, labs and discussed the procedure including the risks, benefits and alternatives for the proposed anesthesia with the patient or authorized representative who has indicated his/her understanding and acceptance.     Dental advisory given  Plan Discussed with: CRNA and Anesthesiologist  Anesthesia Plan Comments:        Anesthesia Quick Evaluation

## 2022-05-27 NOTE — Anesthesia Procedure Notes (Addendum)
Procedure Name: Intubation Date/Time: 05/27/2022 9:36 AM  Performed by: Biagio Borg, CRNAPre-anesthesia Checklist: Patient identified, Emergency Drugs available, Suction available and Patient being monitored Patient Re-evaluated:Patient Re-evaluated prior to induction Oxygen Delivery Method: Circle system utilized Preoxygenation: Pre-oxygenation with 100% oxygen Induction Type: IV induction Ventilation: Mask ventilation without difficulty Laryngoscope Size: McGraph and 3 Grade View: Grade I Tube type: Oral Tube size: 6.5 mm Number of attempts: 1 Airway Equipment and Method: Stylet and Oral airway Placement Confirmation: ETT inserted through vocal cords under direct vision, positive ETCO2 and breath sounds checked- equal and bilateral Secured at: 20 cm Tube secured with: Tape Dental Injury: Teeth and Oropharynx as per pre-operative assessment  Comments: 7.0 ett inserted but felt to be too tight, Changed to 6.5 without incident

## 2022-05-27 NOTE — Progress Notes (Signed)
Patient has Home CPAP unit at bedside. Unit is assembled and found to be in proper working condition, no loose or frayed cords. Patient states she recently vomited, therefore, CPAP not placed on patient at this time. Patient on 2L [place] tolerating well, CPAP on standby at patient bedside.

## 2022-05-27 NOTE — Op Note (Addendum)
PROCEDURES: 1. Abdominal wall reconstruction with bilateral myocutaneous flaps using a TAR release and  two BARD Meshes  one  in  a  diamond configuration measuring 30 x 30 cm and Another BARD macroporus 30x30 cms home plate 2. Recurrent incarcerated Incisional hernia repair greater 16 cms. Multiple midline small swiss cheese defects with loss of domain , biggest defects located above the umbilicus 3. Wound vac Placement 34 x 2 cm ( Prevena plus)  4. Excision of 4.5 cm cyst abdominal wall   Pre-operative Diagnosis: recurrent symptomatic ventral  hernia 16 cms    Post-operative Diagnosis: Same    Surgeon: Morrison    Anesthesia: General endotracheal anesthesia   ASA Class: 2   Surgeon: Caroleen Hamman , MD FACS   Anesthesia: Gen. with endotracheal tube   Assistant: Otho Ket PA-C  (essential due to the complexity of the procedure and to have adequate exposure)   Findings: Large ventral hernia with with loss of domain Great coverage of defect with reconstruction of  the abd wall   Prior evidence of left colectomy w stapled anastomosis, intact and patent.  Estimated Blood Loss: 75cc         Drains: 19 FR x 2  ( Right sub 1 and another Left retrorectus)             Complications: none        Condition: stable   Procedure Details  The patient was seen again in the Holding Room. The benefits, complications, treatment options, and expected outcomes were discussed with the patient. The risks of bleeding, infection, recurrence of symptoms, failure to resolve symptoms,  bowel injury, any of which could require further surgery were reviewed with the patient.   The patient was taken to Operating Room, identified and the procedure verified.  A Time Out was held and the above information confirmed. For prepping I went ahead and place 2-0 silk suture at the colostomy for closure and to contain contamination. I started using elliptical incision incorporating the colostomy. THe hernia sac  was entered and the adhesions were lysed in a sharp and blunt fashion.    Total of at least 90 minutes of LOA was done to restore appropriate anatomy, no injuries were encountered    Generous elliptical incision was created from pubis to xiphoid incorporating the old scar and umbilicus The subcutaneous tissue is divided and the fascia and the hernia sac are identified. The peritoneal cavity was entered.  Extensive adhesions were performed using a combination of electrocautery and Metzenbaum scissors.  There was significant adhesions of the small bowel to the abdominal wall and the omentum to the abdominal wall.   No injuries observed. We were able to reduced chronically incarcerated omentum and also transverse colon. We also felt an inflammatory hard mass on the RUQ within the abdominal wall. No abscess, initially I thought it was mesh.When I finished dissecting it I realized it was an epidermal inclusion cyst. I was able to resected using cautery. Mass measured 4.5 cms  I entered the retrorectus space by identifying/ incising the posterior sheath bilaterally. This dissection is then performed laterally to the linea semilunaris, the most lateral aspect of the retrorectus space, using a combination of both blunt and sharp dissection    The retromuscular space is then bluntly developed to as far as the lateral border of the psoas muscle. The Tranversus muscle was divided in order to release rhe muscle complexes and obtain a tension free repair. The dissection is  repeated on the opposite site and  Was carried superiorly to the central tendon of the diaphragm, using a subxiphoid and retrosternal dissection. Dissection also  was developed inferiorly to the retropubic space.    Next, the posterior rectus sheaths are approximated to one another with 0 PDS suture.  Using liposomal Marcaine TAP Block was performed on each side under direct visualization. Peritoneal defects were closed w 3-0 vicryls and  reinforced using Seprafilm coating.  I Selected a macroporus BARDmesh 30.5 x 30.5 cm  in a diamond and home plate configuration place it anterior to the peritoneum. The mesh layed very flat on in a corset fashion.  I was very happy with the repair.  The mesh was retropubic and retrosternal.  A 19 French Blake drain was placed in the retrorectus space in the standard fashion.  This retrorectus drain was placed on the right inferior abdominal wall.   Attention Was turned to the anterior sheath.  This was approximated with multiple running 0 PDS sutures.  We made sure that we released some of the tension that was attached to the subcutaneous tissue.  19 blake drain placed in the sub 1 tissue Exiting on the Right side.The wound was irrigated with saline antibiotic irrigation.  Skin was approximated with a staples and a Prevena plus wound VAC was placed in the standard fashion.   Please note that Otho Ket PA-C was essential due to the complexity of the procedure and to have adequate exposure ( case took longer than 5 hr). He helped me throughout the case, including the  creating of the abdominal flaps. THis case requires a competent first assist due to the demands of the anatomy and the technical aspects associated to the procedure.   Needle and laparotomy count were correct and there were no immediate complications   Caroleen Hamman, MD, FACS

## 2022-05-28 ENCOUNTER — Inpatient Hospital Stay: Payer: Medicare Other

## 2022-05-28 ENCOUNTER — Encounter: Payer: Self-pay | Admitting: Surgery

## 2022-05-28 LAB — BASIC METABOLIC PANEL
Anion gap: 8 (ref 5–15)
BUN: 28 mg/dL — ABNORMAL HIGH (ref 8–23)
CO2: 24 mmol/L (ref 22–32)
Calcium: 8.4 mg/dL — ABNORMAL LOW (ref 8.9–10.3)
Chloride: 108 mmol/L (ref 98–111)
Creatinine, Ser: 1.14 mg/dL — ABNORMAL HIGH (ref 0.44–1.00)
GFR, Estimated: 50 mL/min — ABNORMAL LOW (ref >60)
Glucose, Bld: 162 mg/dL — ABNORMAL HIGH (ref 70–99)
Potassium: 3.9 mmol/L (ref 3.5–5.1)
Sodium: 140 mmol/L (ref 135–145)

## 2022-05-28 LAB — CBC
HCT: 34.8 % — ABNORMAL LOW (ref 36.0–46.0)
Hemoglobin: 11.7 g/dL — ABNORMAL LOW (ref 12.0–15.0)
MCH: 31 pg (ref 26.0–34.0)
MCHC: 33.6 g/dL (ref 30.0–36.0)
MCV: 92.1 fL (ref 80.0–100.0)
Platelets: 179 10*3/uL (ref 150–400)
RBC: 3.78 MIL/uL — ABNORMAL LOW (ref 3.87–5.11)
RDW: 13.4 % (ref 11.5–15.5)
WBC: 10.6 10*3/uL — ABNORMAL HIGH (ref 4.0–10.5)
nRBC: 0 % (ref 0.0–0.2)

## 2022-05-28 MED ORDER — MORPHINE SULFATE (PF) 2 MG/ML IV SOLN
2.0000 mg | INTRAVENOUS | Status: DC | PRN
  Administered 2022-05-28: 2 mg via INTRAVENOUS
  Filled 2022-05-28: qty 1

## 2022-05-28 MED ORDER — ALBUMIN HUMAN 25 % IV SOLN
25.0000 g | Freq: Once | INTRAVENOUS | Status: AC
  Administered 2022-05-28: 25 g via INTRAVENOUS
  Filled 2022-05-28: qty 100

## 2022-05-28 MED ORDER — PROMETHAZINE HCL 25 MG PO TABS
12.5000 mg | ORAL_TABLET | Freq: Four times a day (QID) | ORAL | Status: DC | PRN
  Administered 2022-05-28: 12.5 mg via ORAL
  Filled 2022-05-28 (×3): qty 1

## 2022-05-28 NOTE — Progress Notes (Signed)
   05/28/22 1530  Urine Characteristics  Intermittent/Straight Cath (mL) 100 mL  Hygiene Peri care AND Foley care   Foley removed at 1000, Pt unable to void notified MD Pabon. New orders for I&O increased fluids. Pt due to void by 2130

## 2022-05-28 NOTE — Anesthesia Postprocedure Evaluation (Signed)
Anesthesia Post Note  Patient: Claudia Martin  Procedure(s) Performed: REPAIR ABDOMINAL WALL, reconstruction (Abdomen) HERNIA REPAIR VENTRAL ADULT, recurrent (Abdomen) INSERTION OF MESH (Abdomen)  Patient location during evaluation: PACU Anesthesia Type: General Level of consciousness: awake and alert Pain management: pain level controlled Vital Signs Assessment: post-procedure vital signs reviewed and stable Respiratory status: spontaneous breathing, nonlabored ventilation and respiratory function stable Cardiovascular status: blood pressure returned to baseline and stable Postop Assessment: no apparent nausea or vomiting Anesthetic complications: no   No notable events documented.   Last Vitals:  Vitals:   05/27/22 2012 05/28/22 0537  BP: (!) 148/82 (!) 102/57  Pulse: 75 76  Resp: 18 18  Temp: (!) 36.2 C 36.6 C  SpO2: 99%     Last Pain:  Vitals:   05/28/22 0537  TempSrc:   PainSc: [place]

## 2022-05-28 NOTE — Progress Notes (Signed)
[place] [facility] Day(s): 1.   Post op day(s): 1 Day Post-Op.   Interval History:  Patient seen and examined No acute events or new complaints overnight.  Patient reports she had an episode of emesis this morning at 0600 but believes this is related to taking her pills on empty stomach; she does not feel distended Abdominal soreness expectedly No fever, chills She does have mild leukocytosis; 10.6K; likely reactive from surgery Hgb 11.7 Slight AKI; sCr - 1.14; UO - 1080 No significant electrolyte derangements Surgical Drains:   - LLQ (Retro-rectus); 285 ccs; serosanguinous   - RLQ (Sub-q): 50 ccs; serosanguinous  She is on Carb modified diet  Vital signs in last 24 hours: [min-max] current  Temp:  [96.8 F (36 C)-98.6 F (37 C)] 97.9 F (36.6 C) (08/23 0537) Pulse Rate:  [52-88] 76 (08/23 0537) Resp:  [13-20] 18 (08/23 0537) BP: (102-164)/(57-84) 102/57 (08/23 0537) SpO2:  [87 %-100 %] 99 % (08/22 2012) Weight:  [79.4 kg] 79.4 kg (08/22 0810)     Height: '4\' 10"'$  (147.3 cm) Weight: 79.4 kg BMI (Calculated): 36.58   Intake/Output last 2 shifts:  08/22 0701 - 08/23 0700 In: 6759 [P.O.:240; I.V.:3138; IV Piggyback:200] Out: 1638 [Urine:1080; Drains:335; Blood:20]   Physical Exam:  Constitutional: alert, cooperative and no distress  Respiratory: breathing non-labored at rest  Cardiovascular: regular rate and sinus rhythm  Gastrointestinal: Soft, incisional soreness expectedly, she does not appear distended, no rebound/guarding. She has surgical drains in RLQ and LLQ both outputs serosanguinous  Integumentary: Midline wound with Prevena; good seal   Labs:     Latest Ref Rng & Units 05/28/2022    5:17 AM 05/27/2022    6:55 PM 03/12/2022   11:43 AM  CBC  WBC 4.0 - 10.5 K/uL 10.6  11.6  6.1   Hemoglobin 12.0 - 15.0 g/dL 11.7  12.9  12.6   Hematocrit 36.0 - 46.0 % 34.8  38.2  37.7   Platelets 150 - 400 K/uL 179  158  250        Latest Ref Rng & Units 05/28/2022    5:17 AM 05/27/2022    6:55 PM 03/12/2022   11:43 AM  CMP  Glucose 70 - 99 mg/dL 162   104   BUN 8 - 23 mg/dL 28   23   Creatinine 0.44 - 1.00 mg/dL 1.14  0.68  0.83   Sodium 135 - 145 mmol/L 140   140   Potassium 3.5 - 5.1 mmol/L 3.9   3.5   Chloride 98 - 111 mmol/L 108   109   CO2 22 - 32 mmol/L 24   25   Calcium 8.9 - 10.3 mg/dL 8.4   8.9   Total Protein 6.5 - 8.1 g/dL   8.1   Total Bilirubin 0.3 - 1.2 mg/dL   0.3   Alkaline Phos 38 - 126 U/L   91   AST 15 - 41 U/L   22   ALT 0 - 44 U/L   12      Imaging studies: No new pertinent imaging studies   Assessment/Plan:  76 y.o. female 1 Day Post-Op s/p abdominal wall reconstruction with placement of Prevena vac   - Okay to continue diet as tolerated. Discussed with RN, if nausea/emesis persists would back diet down and get KUB to ensure no ileus   - Discontinue foley catheter; good UO; monitor renal function  - Complete peri-operative Abx  - Continue IVF  today given AKI; 50 ml/hr  - Monitor abdominal examination; on-going bowel function  - Monitor leukocytosis; likely reactive  - Pain control prn; antiemetics prn  - Continue surgical drains; monitor and record output - Continue Prevena; Day 1/7  - OOB when feasible   All of the above findings and recommendations were discussed with the patient, and the medical team, and all of patient's questions were answered to her expressed satisfaction.  -- Edison Simon, PA-C [facility] 05/28/2022, 7:12 AM M-F: 7am - 4pm

## 2022-05-29 LAB — BASIC METABOLIC PANEL
Anion gap: 5 (ref 5–15)
BUN: 39 mg/dL — ABNORMAL HIGH (ref 8–23)
CO2: 25 mmol/L (ref 22–32)
Calcium: 8.1 mg/dL — ABNORMAL LOW (ref 8.9–10.3)
Chloride: 109 mmol/L (ref 98–111)
Creatinine, Ser: 1.58 mg/dL — ABNORMAL HIGH (ref 0.44–1.00)
GFR, Estimated: 34 mL/min — ABNORMAL LOW (ref >60)
Glucose, Bld: 103 mg/dL — ABNORMAL HIGH (ref 70–99)
Potassium: 3.6 mmol/L (ref 3.5–5.1)
Sodium: 139 mmol/L (ref 135–145)

## 2022-05-29 LAB — CBC
HCT: 28.2 % — ABNORMAL LOW (ref 36.0–46.0)
Hemoglobin: 9.1 g/dL — ABNORMAL LOW (ref 12.0–15.0)
MCH: 31.2 pg (ref 26.0–34.0)
MCHC: 32.3 g/dL (ref 30.0–36.0)
MCV: 96.6 fL (ref 80.0–100.0)
Platelets: 122 10*3/uL — ABNORMAL LOW (ref 150–400)
RBC: 2.92 MIL/uL — ABNORMAL LOW (ref 3.87–5.11)
RDW: 14 % (ref 11.5–15.5)
WBC: 6.4 10*3/uL (ref 4.0–10.5)
nRBC: 0 % (ref 0.0–0.2)

## 2022-05-29 LAB — SURGICAL PATHOLOGY

## 2022-05-29 MED ORDER — SODIUM CHLORIDE 0.9 % IV BOLUS
1000.0000 mL | Freq: Once | INTRAVENOUS | Status: AC
  Administered 2022-05-29: 1000 mL via INTRAVENOUS

## 2022-05-29 MED ORDER — ALBUMIN HUMAN 25 % IV SOLN
25.0000 g | Freq: Once | INTRAVENOUS | Status: AC
Start: 2022-05-29 — End: 2022-05-29
  Administered 2022-05-29: 25 g via INTRAVENOUS
  Filled 2022-05-29: qty 100

## 2022-05-29 NOTE — Plan of Care (Signed)

## 2022-05-29 NOTE — Plan of Care (Signed)
  Problem: Coping: Goal: Level of anxiety will decrease Outcome: Progressing   Problem: Pain Managment: Goal: General experience of comfort will improve Outcome: Progressing   Problem: Safety: Goal: Ability to remain free from injury will improve Outcome: Progressing   

## 2022-05-29 NOTE — Progress Notes (Signed)
[place] Hospital Day(s): 2.   Post op day(s): 2 Days Post-Op.   Interval History:  Patient seen and examined No acute events or new complaints overnight.  Patient seems somnolent this morning; responds to verbal stimuli Abdominal soreness unchanged No fever, chills, nausea, or emesis Leukocytosis has normalized; 6.4K Hgb to 9.1; suspect this is dilutional given drop in all cell lines  Renal function worsening; sCr - 1.58; UO - 100 ccs + unmeasured No significant electrolyte derangements Surgical Drains:   - LLQ (Retro-rectus); 210 ccs; serosanguinous   - RLQ (Sub-q): 35 ccs; serosanguinous  She is on CLD; tolerating No flatus  Vital signs in last 24 hours: [min-max] current  Temp:  [98 F (36.7 C)-98.7 F (37.1 C)] 98.6 F (37 C) (08/24 0718) Pulse Rate:  [78-90] 79 (08/24 0718) Resp:  [14-17] 17 (08/24 0718) BP: (81-118)/(39-61) 104/59 (08/24 0718) SpO2:  [90 %-92 %] 91 % (08/24 0718)     Height: '4\' 10"'$  (147.3 cm) Weight: 79.4 kg BMI (Calculated): 36.58   Intake/Output last 2 shifts:  08/23 0701 - 08/24 0700 In: -  Out: 620 [Urine:175; Emesis/NG output:200; Drains:245]   Physical Exam:  Constitutional: more somnolent this morning; will respond to verbal stimuli HEENT: mucus membranes are dry Respiratory: breathing non-labored at rest  Cardiovascular: regular rate and sinus rhythm  Gastrointestinal: Soft, incisional soreness expectedly, she does not appear overtly distended, no rebound/guarding. She has surgical drains in RLQ and LLQ both outputs serosanguinous  Integumentary: Midline wound with Prevena; good seal   Labs:     Latest Ref Rng & Units 05/29/2022    5:49 AM 05/28/2022    5:17 AM 05/27/2022    6:55 PM  CBC  WBC 4.0 - 10.5 K/uL 6.4  10.6  11.6   Hemoglobin 12.0 - 15.0 g/dL 9.1  11.7  12.9   Hematocrit 36.0 - 46.0 % 28.2  34.8  38.2   Platelets 150 - 400 K/uL 122  179  158       Latest Ref Rng & Units  05/29/2022    5:49 AM 05/28/2022    5:17 AM 05/27/2022    6:55 PM  CMP  Glucose 70 - 99 mg/dL 103  162    BUN 8 - 23 mg/dL 39  28    Creatinine 0.44 - 1.00 mg/dL 1.58  1.14  0.68   Sodium 135 - 145 mmol/L 139  140    Potassium 3.5 - 5.1 mmol/L 3.6  3.9    Chloride 98 - 111 mmol/L 109  108    CO2 22 - 32 mmol/L 25  24    Calcium 8.9 - 10.3 mg/dL 8.1  8.4       Imaging studies: No new pertinent imaging studies   Assessment/Plan:  76 y.o. female 2 Days Post-Op s/p abdominal wall reconstruction with placement of Prevena vac   - Continue CLD; hesitant to advance  - Continue IVF today given AKI; 100 ml/hr + albumin  bolus  - Monitor abdominal examination; on-going bowel function  - Monitor leukocytosis; resolved  - Monitor renal function; worsening this AM; monitor UO  - Pain control prn; antiemetics prn  - Continue surgical drains; monitor and record output - Continue Prevena; Day 2/7  - OOB when feasible   All of the above findings and recommendations were discussed with the patient, and the medical team, and all of patient's questions were answered to her expressed satisfaction.  -- Edison Simon, PA-C  [facility] 05/29/2022, 7:29 AM M-F: 7am - 4pm

## 2022-05-30 ENCOUNTER — Inpatient Hospital Stay: Payer: Medicare Other

## 2022-05-30 LAB — CBC
HCT: 28.7 % — ABNORMAL LOW (ref 36.0–46.0)
Hemoglobin: 9.5 g/dL — ABNORMAL LOW (ref 12.0–15.0)
MCH: 31.8 pg (ref 26.0–34.0)
MCHC: 33.1 g/dL (ref 30.0–36.0)
MCV: 96 fL (ref 80.0–100.0)
Platelets: 132 10*3/uL — ABNORMAL LOW (ref 150–400)
RBC: 2.99 MIL/uL — ABNORMAL LOW (ref 3.87–5.11)
RDW: 13.5 % (ref 11.5–15.5)
WBC: 5.3 10*3/uL (ref 4.0–10.5)
nRBC: 0 % (ref 0.0–0.2)

## 2022-05-30 LAB — MAGNESIUM: Magnesium: 2.2 mg/dL (ref 1.7–2.4)

## 2022-05-30 LAB — BASIC METABOLIC PANEL
Anion gap: 4 — ABNORMAL LOW (ref 5–15)
BUN: 26 mg/dL — ABNORMAL HIGH (ref 8–23)
CO2: 24 mmol/L (ref 22–32)
Calcium: 8.1 mg/dL — ABNORMAL LOW (ref 8.9–10.3)
Chloride: 114 mmol/L — ABNORMAL HIGH (ref 98–111)
Creatinine, Ser: 1.05 mg/dL — ABNORMAL HIGH (ref 0.44–1.00)
GFR, Estimated: 55 mL/min — ABNORMAL LOW (ref >60)
Glucose, Bld: 86 mg/dL (ref 70–99)
Potassium: 3.5 mmol/L (ref 3.5–5.1)
Sodium: 142 mmol/L (ref 135–145)

## 2022-05-30 LAB — PHOSPHORUS: Phosphorus: 2 mg/dL — ABNORMAL LOW (ref 2.5–4.6)

## 2022-05-30 MED ORDER — K PHOS MONO-SOD PHOS DI & MONO 155-852-130 MG PO TABS
500.0000 mg | ORAL_TABLET | Freq: Two times a day (BID) | ORAL | Status: AC
Start: 2022-05-30 — End: 2022-06-01
  Administered 2022-05-30 – 2022-06-01 (×5): 500 mg via ORAL
  Filled 2022-05-30 (×5): qty 2

## 2022-05-30 MED ORDER — ALUM & MAG HYDROXIDE-SIMETH 200-200-20 MG/5ML PO SUSP
30.0000 mL | Freq: Four times a day (QID) | ORAL | Status: DC | PRN
Start: 2022-05-30 — End: 2022-06-02
  Administered 2022-05-30 – 2022-06-01 (×3): 30 mL via ORAL
  Filled 2022-05-30 (×3): qty 30

## 2022-05-30 MED ORDER — METOPROLOL TARTRATE 25 MG PO TABS
12.5000 mg | ORAL_TABLET | Freq: Two times a day (BID) | ORAL | Status: DC
  Administered 2022-05-30 – 2022-05-31 (×3): 12.5 mg via ORAL
  Filled 2022-05-30 (×3): qty 1

## 2022-05-30 MED ORDER — DEXTROSE IN LACTATED RINGERS 5 % IV SOLN: INTRAVENOUS | Status: DC

## 2022-05-30 MED ORDER — ASPIRIN 81 MG PO CHEW
81.0000 mg | CHEWABLE_TABLET | Freq: Every day | ORAL | Status: DC
  Administered 2022-05-30 – 2022-06-02 (×4): 81 mg via ORAL
  Filled 2022-05-30 (×4): qty 1

## 2022-05-30 MED ORDER — ENOXAPARIN SODIUM 40 MG/0.4ML IJ SOSY
40.0000 mg | PREFILLED_SYRINGE | INTRAMUSCULAR | Status: DC
  Administered 2022-05-31 – 2022-06-02 (×3): 40 mg via SUBCUTANEOUS
  Filled 2022-05-30 (×3): qty 0.4

## 2022-05-30 NOTE — Evaluation (Signed)
Physical Therapy Evaluation Patient Details Name: Claudia Martin MRN: [id number] DOB: 02-18-1946 Today's Date: 05/30/2022  History of Present Illness  Pt is a 76 y.o. female 1 Day Post-Op s/p abdominal wall reconstruction with placement of Prevena vac. PMH of sleep apnea, HTN, MI, CAD, cardiac stents.   Clinical Impression  Patient alert, oriented to self, place, disoriented to date. She reported at baseline she is ambulatory with [facility], lives with her boyfriend, previously modI/I for ADLs, IADLs. Did have some confusion over home set up/potential stairs.   The patient was able to perform log rolling with minA and cueing. Fair-good sitting balance during session, able to participate in pericare after BSC. Several transfers performed with supervision-CGA with RW. Pt needed cueing for safe hand placement. She also ambulated ~26f with RW and CGA as well, flexed trunk but reciprocal gait pattern.  Overall the patient demonstrated deficits (see "PT Problem List") that impede the patient's functional abilities, safety, and mobility and would benefit from skilled PT intervention. Recommendation at this time is HHPT with frequent/constant supervision/assistance to maximize function, safety, and independence.        Recommendations for follow up therapy are one component of a multi-disciplinary discharge planning process, led by the attending physician.  Recommendations may be updated based on patient status, additional functional criteria and insurance authorization.  Follow Up Recommendations Home health PT      Assistance Recommended at Discharge Frequent or constant Supervision/Assistance  Patient can return home with the following  A little help with walking and/or transfers;A little help with bathing/dressing/bathroom;Assistance with cooking/housework;Assist for transportation;Direct supervision/assist for medications management;Help with stairs or ramp for entrance    Equipment  Recommendations None recommended by PT (pt stated she has a RW and SPC at home)  Recommendations for Other Services       Functional Status Assessment Patient has had a recent decline in their functional status and demonstrates the ability to make significant improvements in function in a reasonable and predictable amount of time.     Precautions / Restrictions Precautions Precautions: Fall Restrictions Weight Bearing Restrictions: No      Mobility  Bed Mobility Overal bed mobility: Needs Assistance Bed Mobility: Rolling, Sidelying to Sit Rolling: Min assist Sidelying to sit: Min assist       General bed mobility comments: cued for bed rails ,technique, minA via handheld assist to help pt roll and trunk elevation    Transfers Overall transfer level: Needs assistance Equipment used: Rolling walker (2 wheels) Transfers: Sit to/from Stand, Bed to chair/wheelchair/BSC Sit to Stand: Supervision   Step pivot transfers: Supervision            Ambulation/Gait Ambulation/Gait assistance: Min guard Gait Distance (Feet): 30 Feet Assistive device: Rolling walker (2 wheels) Gait Pattern/deviations: Step-through pattern       General Gait Details: flexed trunk  Stairs            Wheelchair Mobility    Modified Rankin (Stroke Patients Only)       Balance Overall balance assessment: Needs assistance Sitting-balance support: Feet supported Sitting balance-Leahy Scale: Fair     Standing balance support: Reliant on assistive device for balance Standing balance-Leahy Scale: Fair                               Pertinent Vitals/Pain Pain Assessment Pain Assessment: 0-10 Pain Score: 1  Pain Location: abdomen Pain Descriptors / Indicators: Aching Pain Intervention(s): Limited activity  within patient's tolerance, Monitored during session, Repositioned    Home Living Family/patient expects to be discharged to:: Private residence Living  Arrangements: Spouse/significant other Available Help at Discharge: Family;Available 24 hours/day Type of Home: House Home Access: Stairs to enter   [employer] of Steps: 1   Home Layout: One level Home Equipment: Conservation officer, nature (2 wheels);Cane - single point      Prior Function               Mobility Comments: pt reported using her [facility] for mobility. some questionable information of PLOF. had a lot of difficulty trying to explain stairs/house layout       Hand Dominance        Extremity/Trunk Assessment   Upper Extremity Assessment Upper Extremity Assessment: Overall WFL for tasks assessed    Lower Extremity Assessment Lower Extremity Assessment: Overall WFL for tasks assessed    Cervical / Trunk Assessment Cervical / Trunk Assessment: Other exceptions Cervical / Trunk Exceptions: abdominal surgery s/p wound vac, bilateral drains  Communication      Cognition Arousal/Alertness: Awake/alert Behavior During Therapy: WFL for tasks assessed/performed Overall Cognitive Status: Within Functional Limits for tasks assessed                                          General Comments      Exercises Other Exercises Other Exercises: transfered from EOB to [facility] to recliner, ambulated in room and returned to [facility], and then transferred back to recliner, supervision.   Assessment/Plan    PT Assessment Patient needs continued PT services  PT Problem List Decreased strength;Decreased mobility;Decreased activity tolerance;Decreased balance       PT Treatment Interventions DME instruction;Therapeutic exercise;Gait training;Balance training;Stair training;Neuromuscular re-education;Functional mobility training;Therapeutic activities;Patient/family education    PT Goals (Current goals can be found in the Care Plan section)  Acute Rehab PT Goals Patient Stated Goal: to go home PT Goal Formulation: With patient Time For Goal Achievement:  06/13/22 Potential to Achieve Goals: Good    Frequency Min 2X/week     Co-evaluation               AM-PAC PT "6 Clicks" Mobility  Outcome Measure Help needed turning from your back to your side while in a flat bed without using bedrails?: A Little Help needed moving from lying on your back to sitting on the side of a flat bed without using bedrails?: A Little Help needed moving to and from a bed to a chair (including a wheelchair)?: A Little Help needed standing up from a chair using your arms (e.g., wheelchair or bedside chair)?: A Little Help needed to walk in hospital room?: A Little Help needed climbing 3-5 steps with a railing? : A Lot 6 Click Score: 17    End of Session   Activity Tolerance: Patient tolerated treatment well Patient left: in chair;with chair alarm set;with call bell/phone within reach Nurse Communication: Mobility status PT Visit Diagnosis: Other abnormalities of gait and mobility (R26.89);Difficulty in walking, not elsewhere classified (R26.2);Muscle weakness (generalized) (M62.81);Pain Pain - Right/Left:  (midline) Pain - part of body:  (abdomen)    Time: 4132-4401 PT Time Calculation (min) (ACUTE ONLY): 36 min   Charges:   PT Evaluation $PT Eval Low Complexity: 1 Low PT Treatments $Therapeutic Activity: 23-37 mins        Lieutenant Diego PT, DPT 3:50 PM,05/30/22

## 2022-05-30 NOTE — Progress Notes (Signed)
POD # 3 s/p AWR   Subjective: Feeling better, mentation back to normal. Now having good U/O. She responded to albumin and fluid bolus. Labs ok, BUN and Creat improving. Hb stable and went up actually after resuscitation. Hyperchloremia from NS Lyrica DCed Minimal pain KUB per reviewed nml, gas pattern. Still only on clear liquids. No flatus Drains 230   Objective: Vital signs in last 24 hours: Temp:  [98.3 F (36.8 C)-99.5 F (37.5 C)] 99 F (37.2 C) (08/25 0442) Pulse Rate:  [73-83] 81 (08/25 0442) Resp:  [16-20] 20 (08/25 0442) BP: (111-132)/(51-67) 132/67 (08/25 0442) SpO2:  [90 %-97 %] 95 % (08/25 0442) Last BM Date : 05/26/22  Intake/Output from previous day: 08/24 0701 - 08/25 0700 In: 4226.1 [P.O.:360; I.V.:2766.8; IV Piggyback:1099.3] Out: 1105 [Urine:875; Drains:230] Intake/Output this shift: No intake/output data recorded.  Physical exam: NAD  Abd: soft, drain mostly serous w some sanguinous tinch. No evidence of peritonitis   Lab Results: CBC  Recent Labs    05/29/22 0549 05/30/22 0601  WBC 6.4 5.3  HGB 9.1* 9.5*  HCT 28.2* 28.7*  PLT 122* 132*   BMET Recent Labs    05/29/22 0549 05/30/22 0601  NA 139 142  K 3.6 3.5  CL 109 114*  CO2 25 24  GLUCOSE 103* 86  BUN 39* 26*  CREATININE 1.58* 1.05*  CALCIUM 8.1* 8.1*   PT/INR No results for input(s): "LABPROT", "INR" in the last 72 hours. ABG No results for input(s): "PHART", "HCO3" in the last 72 hours.  Invalid input(s): "PCO2", "PO2"  Studies/Results: DG ABD ACUTE 2+V W 1V CHEST  Result Date: 05/30/2022 CLINICAL DATA:  Postoperative ileus. EXAM: DG ABDOMEN ACUTE WITH 1 VIEW CHEST COMPARISON:  May 28, 2022. FINDINGS: There is no evidence of dilated bowel loops or free intraperitoneal air. Two surgical drains are noted, 1 with distal tip in central portion of the abdomen and the other in the right lower quadrant. Residual contrast is noted in the nondilated colon. Midline surgical staples  are noted. Heart size and mediastinal contours are within normal limits. Both lungs are clear. IMPRESSION: No abnormal bowel dilatation is noted. No acute cardiopulmonary disease. Electronically Signed   By: Marijo Conception M.D.   On: 05/30/2022 08:24   DG Abd Portable 1V  Result Date: 05/28/2022 CLINICAL DATA:  Ileus.  Status post hernia repair. EXAM: PORTABLE ABDOMEN - 1 VIEW COMPARISON:  CT abdomen/pelvis 04/28/2022. FINDINGS: Surgical clips within the right upper quadrant of the abdomen. Skin staples along the midline abdomen. Multiple abdominal surgical drains. Gaseous distention of the stomach, small bowel and large bowel. Air and stool are seen to the level of the rectum. Small-to-moderate colonic stool burden. Small amount of retained contrast within the sigmoid colon. Spondylosis and mild levocurvature of the visualized thoracolumbar spine. IMPRESSION: Gaseous distension of the stomach, small bowel and large bowel. Air and stool are seen to the level of the rectum. Findings are compatible with the provided history of ileus. Small-to-moderate colonic stool burden. Multiple abdominal surgical drains. Electronically Signed   By: Kellie Simmering D.O.   On: 05/28/2022 09:44    Anti-infectives: Anti-infectives (From admission, onward)    Start     Dose/Rate Route Frequency Ordered Stop   05/27/22 2200  ceFAZolin (ANCEF) IVPB 2g/100 mL premix        2 g 200 mL/hr over 30 Minutes Intravenous Every 8 hours 05/27/22 1554 05/28/22 1602   05/27/22 1511  ANCEF 1 gram in 0.9% normal  saline 1000 mL  Status:  Discontinued          As needed 05/27/22 1512 05/27/22 1532   05/27/22 0756  ceFAZolin (ANCEF) 2-4 GM/100ML-% IVPB       Note to Pharmacy: Sylvester Harder P: cabinet override      05/27/22 0756 05/27/22 1007   05/27/22 0600  ceFAZolin (ANCEF) IVPB 2g/100 mL premix        2 g 200 mL/hr over 30 Minutes Intravenous On call to O.R. 05/27/22 0024 05/27/22 1334       Assessment/Plan:  Change fluids to  d5lr 75 cc, her tank is full ( appropriate intravascular volume) . May need lasix Saturday or Sunday as third space fluid ( from resuscitation) mobilizes into the intravascular space and distends the right atrium. ( May cause a fib) . Start low dose lopressors and ASA. Holding brelinta ( hx of DES 06/2021 evaluated by cards prior to surgery) . May start lovenox tomorrow. Watch for PLs as they are 132. No evidence of thromboembolic or cardiac events.  Mobilize today, PT consult placed. May advance diet as tolerated. She wishes to stay on clears She will stay over the weekend and anticipate DC Monday or Tuesday  Caroleen Hamman, MD, [facility]  05/30/2022

## 2022-05-30 NOTE — Plan of Care (Signed)

## 2022-05-30 NOTE — Care Management Important Message (Signed)
Important Message  Patient Details  Name: Krystol Rocco MRN: [id number] Date of Birth: 1946/07/29   Medicare Important Message Given:  N/A - LOS <3 / Initial given by admissions     Juliann Pulse A Sayre Mazor 05/30/2022, 10:19 AM

## 2022-05-31 LAB — CBC
HCT: 30.6 % — ABNORMAL LOW (ref 36.0–46.0)
Hemoglobin: 10.3 g/dL — ABNORMAL LOW (ref 12.0–15.0)
MCH: 31.3 pg (ref 26.0–34.0)
MCHC: 33.7 g/dL (ref 30.0–36.0)
MCV: 93 fL (ref 80.0–100.0)
Platelets: 154 10*3/uL (ref 150–400)
RBC: 3.29 MIL/uL — ABNORMAL LOW (ref 3.87–5.11)
RDW: 13 % (ref 11.5–15.5)
WBC: 5.6 10*3/uL (ref 4.0–10.5)
nRBC: 0 % (ref 0.0–0.2)

## 2022-05-31 LAB — COMPREHENSIVE METABOLIC PANEL
ALT: 9 U/L (ref 0–44)
AST: 22 U/L (ref 15–41)
Albumin: 3.3 g/dL — ABNORMAL LOW (ref 3.5–5.0)
Alkaline Phosphatase: 56 U/L (ref 38–126)
Anion gap: 5 (ref 5–15)
BUN: 15 mg/dL (ref 8–23)
CO2: 26 mmol/L (ref 22–32)
Calcium: 8.2 mg/dL — ABNORMAL LOW (ref 8.9–10.3)
Chloride: 111 mmol/L (ref 98–111)
Creatinine, Ser: 0.66 mg/dL (ref 0.44–1.00)
GFR, Estimated: >60 mL/min (ref >60)
Glucose, Bld: 123 mg/dL — ABNORMAL HIGH (ref 70–99)
Potassium: 3.1 mmol/L — ABNORMAL LOW (ref 3.5–5.1)
Sodium: 142 mmol/L (ref 135–145)
Total Bilirubin: 1.2 mg/dL (ref 0.3–1.2)
Total Protein: 6.2 g/dL — ABNORMAL LOW (ref 6.5–8.1)

## 2022-05-31 LAB — PHOSPHORUS: Phosphorus: 2.3 mg/dL — ABNORMAL LOW (ref 2.5–4.6)

## 2022-05-31 LAB — MAGNESIUM: Magnesium: 1.8 mg/dL (ref 1.7–2.4)

## 2022-05-31 MED ORDER — METOPROLOL TARTRATE 25 MG PO TABS
25.0000 mg | ORAL_TABLET | Freq: Two times a day (BID) | ORAL | Status: DC
  Administered 2022-05-31 – 2022-06-02 (×4): 25 mg via ORAL
  Filled 2022-05-31 (×4): qty 1

## 2022-05-31 NOTE — Progress Notes (Signed)
Patient ID: Claudia Martin, female   DOB: 1946/06/11, 76 y.o.   MRN: [id number]     [place] Hospital Day(s): 4.   Interval History: Patient seen and examined, no acute events or new complaints overnight. Patient reports feeling better this morning.  She endorses having bowel movement.  She endorses that the abdominal pain is improving with pain medications.  She denies nausea or vomiting.  She endorses tolerating clear liquid diet  Vital signs in last 24 hours: [min-max] current  Temp:  [97.9 F (36.6 C)-98.4 F (36.9 C)] 97.9 F (36.6 C) (08/26 0803) Pulse Rate:  [80-87] 82 (08/26 0816) Resp:  [17-22] 17 (08/26 0803) BP: (154-172)/(81-91) 170/84 (08/26 0816) SpO2:  [95 %-97 %] 97 % (08/26 0803)     Height: '4\' 10"'$  (147.3 cm) Weight: 79.4 kg BMI (Calculated): 36.58   Physical Exam:  Constitutional: alert, cooperative and no distress  Respiratory: breathing non-labored at rest  Cardiovascular: regular rate and sinus rhythm  Gastrointestinal: soft, non-tender, and non-distended.  Praveena in place.  Drain in place with serosanguineous fluid.  Labs:     Latest Ref Rng & Units 05/31/2022    4:41 AM 05/30/2022    6:01 AM 05/29/2022    5:49 AM  CBC  WBC 4.0 - 10.5 K/uL 5.6  5.3  6.4   Hemoglobin 12.0 - 15.0 g/dL 10.3  9.5  9.1   Hematocrit 36.0 - 46.0 % 30.6  28.7  28.2   Platelets 150 - 400 K/uL 154  132  122       Latest Ref Rng & Units 05/31/2022    4:41 AM 05/30/2022    6:01 AM 05/29/2022    5:49 AM  CMP  Glucose 70 - 99 mg/dL 123  86  103   BUN 8 - 23 mg/dL 15  26  39   Creatinine 0.44 - 1.00 mg/dL 0.66  1.05  1.58   Sodium 135 - 145 mmol/L 142  142  139   Potassium 3.5 - 5.1 mmol/L 3.1  3.5  3.6   Chloride 98 - 111 mmol/L 111  114  109   CO2 22 - 32 mmol/L '26  24  25   '$ Calcium 8.9 - 10.3 mg/dL 8.2  8.1  8.1   Total Protein 6.5 - 8.1 g/dL 6.2     Total Bilirubin 0.3 - 1.2 mg/dL 1.2     Alkaline Phos 38 - 126 U/L 56     AST 15 - 41 U/L 22     ALT 0  - 44 U/L 9       Imaging studies: No new pertinent imaging studies   Assessment/Plan:  76 y.o. female with recurrent ventral hernia 4 Days Post-Op s/p abdominal wall reconstruction, complicated by pertinent comorbidities including hypertension, hypothyroidism, obesity, coronary artery disease status post stent.  Patient evaluated this morning.  She was found with stable vital signs but with elevated blood pressure.  Will adjust metoprolol to home dose.  She was found with improvement of abdominal pain.  She tolerated clear liquid diet.  I will advance her diet to full liquids.  Patient has had minimal ambulation.  I will consult PT/OT for evaluation and management of weakness after surgery.  We will continue with DVT prophylaxis.  I encouraged the patient to get out of bed.   Arnold Long, MD

## 2022-05-31 NOTE — Evaluation (Signed)
Occupational Therapy Evaluation Patient Details Name: Claudia Martin MRN: [id number] DOB: October 16, 1945 Today's Date: 05/31/2022   History of Present Illness Pt is a 76 y.o. female 1 Day Post-Op s/p abdominal wall reconstruction with placement of Prevena vac. PMH of sleep apnea, HTN, MI, CAD, cardiac stents.   Clinical Impression   Pt seen for OT evaluation this date, 76 yo female post op abdominal wall reconstruction with placement of Prevena vac.  She was previously independent with all self care and IADL tasks including driving.  She ambulated with a cane prior to admission.  She presents with muscle weakness, decreased activity tolerance, decreased transfers, functional mobility and decreased ability to perform self care and IADL tasks.  She would like to return home with her boyfriend upon discharge.  She would benefit from skilled OT services to maximize her safety and independence with necessary daily tasks.       Recommendations for follow up therapy are one component of a multi-disciplinary discharge planning process, led by the attending physician.  Recommendations may be updated based on patient status, additional functional criteria and insurance authorization.   Follow Up Recommendations  Home health OT    Assistance Recommended at Discharge Intermittent Supervision/Assistance  Patient can return home with the following A little help with walking and/or transfers;Assistance with cooking/housework;A little help with bathing/dressing/bathroom    Functional Status Assessment  Patient has had a recent decline in their functional status and demonstrates the ability to make significant improvements in function in a reasonable and predictable amount of time.  Equipment Recommendations  Tub/shower seat    Recommendations for Other Services       Precautions / Restrictions Precautions Precautions: Fall Restrictions Weight Bearing Restrictions: No      Mobility Bed  Mobility Overal bed mobility: Needs Assistance Bed Mobility: Rolling, Sidelying to Sit Rolling: Min assist Sidelying to sit: Min assist       General bed mobility comments: cues for bed mobility and transfers for hand placement and assist    Transfers Overall transfer level: Needs assistance Equipment used: Rolling walker (2 wheels) Transfers: Sit to/from Stand, Bed to chair/wheelchair/BSC Sit to Stand: Supervision, Min guard                  Balance Overall balance assessment: Needs assistance Sitting-balance support: Feet supported Sitting balance-Leahy Scale: Good     Standing balance support: Reliant on assistive device for balance Standing balance-Leahy Scale: Fair                             ADL either performed or assessed with clinical judgement   ADL Overall ADL's : Needs assistance/impaired Eating/Feeding: Modified independent   Grooming: Min guard Grooming Details (indicate cue type and reason): standing at sink Upper Body Bathing: Modified independent   Lower Body Bathing: Min guard;Set up   Upper Body Dressing : Modified independent   Lower Body Dressing: Min guard;Set up   Toilet Transfer: Min guard   Toileting- Clothing Manipulation and Hygiene: Min guard       Functional mobility during ADLs: Min guard;Rolling walker (2 wheels)       Vision Baseline Vision/History: 1 Wears glasses       Perception     Praxis      Pertinent Vitals/Pain Pain Assessment Pain Assessment: 0-10 Pain Score: 5  Pain Location: abdomen Pain Descriptors / Indicators: Aching Pain Intervention(s): Limited activity within patient's tolerance, Monitored during session, Repositioned  Hand Dominance Right   Extremity/Trunk Assessment Upper Extremity Assessment Upper Extremity Assessment: Generalized weakness   Lower Extremity Assessment Lower Extremity Assessment: Defer to PT evaluation   Cervical / Trunk Assessment Cervical / Trunk  Exceptions: abdominal surgery s/p wound vac, bilateral drains   Communication Communication Communication: No difficulties   Cognition Arousal/Alertness: Awake/alert Behavior During Therapy: WFL for tasks assessed/performed Overall Cognitive Status: Within Functional Limits for tasks assessed                                       General Comments   Toileting with min guard for transfer, hygiene and clothing negotiation.      Exercises     Shoulder Instructions      Home Living Family/patient expects to be discharged to:: Private residence Living Arrangements: Spouse/significant other Available Help at Discharge: Family;Available 24 hours/day Type of Home: House Home Access: Stairs to enter [employer] of Steps: 1   Home Layout: One level;Laundry or work area in basement     [employer] Shower/Tub: Corporate investment banker: Programmer, systems: Yes   Home Equipment: Conservation officer, nature (2 wheels);Cane - single point          Prior Functioning/Environment               Mobility Comments: Pt reports she uses a cane for mobility          OT Problem List: Decreased strength;Decreased activity tolerance;Decreased knowledge of use of DME or AE;Impaired balance (sitting and/or standing);Pain      OT Treatment/Interventions: Self-care/ADL training;Balance training;Therapeutic exercise;DME and/or AE instruction;Therapeutic activities;Patient/family education    OT Goals(Current goals can be found in the care plan section) Acute Rehab OT Goals Patient Stated Goal: Pt would like to return home and be as independent as possible. OT Goal Formulation: With patient Time For Goal Achievement: 06/14/22 Potential to Achieve Goals: Good ADL Goals Pt Will Perform Lower Body Dressing: with modified independence Pt Will Transfer to Toilet: with modified independence  OT Frequency: Min 2X/week    Co-evaluation               AM-PAC OT "6 Clicks" Daily Activity     Outcome Measure Help from another person eating meals?: None Help from another person taking care of personal grooming?: None Help from another person toileting, which includes using toliet, bedpan, or urinal?: A Little Help from another person bathing (including washing, rinsing, drying)?: A Little Help from another person to put on and taking off regular upper body clothing?: None Help from another person to put on and taking off regular lower body clothing?: A Little 6 Click Score: 21   End of Session Equipment Utilized During Treatment: Gait belt;Rolling walker (2 wheels)  Activity Tolerance: Patient tolerated treatment well Patient left: in bed;with call bell/phone within reach;with bed alarm set  OT Visit Diagnosis: Muscle weakness (generalized) (M62.81);Pain;Unsteadiness on feet (R26.81) Pain - part of body:  (abdomen)                Time: 0177-9390 OT Time Calculation (min): 31 min Charges:  OT General Charges $OT Visit: 1 Visit OT Evaluation $OT Eval Low Complexity: 1 Low OT Treatments $Self Care/Home Management : 8-22 mins Quasim Doyon T Candy Leverett, OTR/L, CLT Akirah Storck 05/31/2022, 12:32 PM

## 2022-06-01 MED ORDER — LOSARTAN POTASSIUM 25 MG PO TABS
25.0000 mg | ORAL_TABLET | Freq: Every day | ORAL | Status: DC
  Administered 2022-06-01 – 2022-06-02 (×2): 25 mg via ORAL
  Filled 2022-06-01 (×2): qty 1

## 2022-06-01 MED ORDER — PANTOPRAZOLE SODIUM 40 MG PO TBEC
40.0000 mg | DELAYED_RELEASE_TABLET | Freq: Every day | ORAL | Status: DC
  Administered 2022-06-01: 40 mg via ORAL
  Filled 2022-06-01: qty 1

## 2022-06-01 NOTE — Progress Notes (Signed)
PHARMACIST - PHYSICIAN COMMUNICATION  CONCERNING: IV to Oral Route Change Policy  RECOMMENDATION: This patient is receiving pantoprazole by the intravenous route.  Based on criteria approved by the Pharmacy and Therapeutics Committee, the intravenous medication(s) is/are being converted to the equivalent oral dose form(s).   DESCRIPTION: These criteria include: The patient is eating (either orally or via tube) and/or has been taking other orally administered medications for a least 24 hours The patient has no evidence of active gastrointestinal bleeding or impaired GI absorption (gastrectomy, short bowel, patient on TNA or NPO).  If you have questions about this conversion, please contact the [facility] 06/01/2022 10:11 AM

## 2022-06-01 NOTE — TOC Initial Note (Signed)
Transition of Care [facility]) - Initial/Assessment Note    Patient Details  Name: Claudia Martin MRN: [id number] Date of Birth: [birth year]  Transition of Care [facility]) CM/SW Contact:    Valente David, RN Phone Number: 06/01/2022, 4:27 PM  Clinical Narrative:                  Patient admitted from home, lives alone.  PCP is Dr. Humphrey Rolls, obtains medications from [facility].  State either boyfriend or daughter will be able to provide transportation home when discharged.  Aware of PT/OT recommendations, no preference.  Calls placed to [facility] and [facility].  [facility] unable to accept at that time, waiting for call back from [facility].  TOC team will continue to follow for discharge planning needs.   Expected Discharge Plan: [place] Barriers to Discharge: Continued Medical Work up   Patient Goals and CMS Choice Patient states their goals for this hospitalization and ongoing recovery are:: Home with home health CMS Medicare.gov Compare Post Acute Care list provided to:: Patient Choice offered to / list presented to : Patient  Expected Discharge Plan and Services Expected Discharge Plan: [place] Acute Care Choice: [place] arrangements for the past 2 months: Single Family Home                                      Prior Living Arrangements/Services Living arrangements for the past 2 months: Single Family Home Lives with:: Self Patient language and need for interpreter reviewed:: Yes Do you feel safe going back to the place where you live?: Yes        Care giver support system in place?: Yes (comment)   Criminal Activity/Legal Involvement Pertinent to Current Situation/Hospitalization: No - Comment as needed  Activities of Daily Living Home Assistive Devices/Equipment: Cane (specify quad or straight) ADL Screening (condition at time of admission) Patient's cognitive ability adequate to safely complete daily activities?:  Yes Is the patient deaf or have difficulty hearing?: Yes Does the patient have difficulty seeing, even when wearing glasses/contacts?: Yes Does the patient have difficulty concentrating, remembering, or making decisions?: Yes Patient able to express need for assistance with ADLs?: Yes Does the patient have difficulty dressing or bathing?: No Independently performs ADLs?: Yes (appropriate for developmental age) Does the patient have difficulty walking or climbing stairs?: Yes Weakness of Legs: Both Weakness of Arms/Hands: None  Permission Sought/Granted   Permission granted to share information with : Yes, Verbal Permission Granted     Permission granted to share info w AGENCY: [facility] and [facility]        Emotional Assessment Appearance:: Appears stated age Attitude/Demeanor/Rapport: Engaged Affect (typically observed): Appropriate Orientation: : Oriented to Self, Oriented to Place, Oriented to  Time, Oriented to Situation   Psych Involvement: No (comment)  Admission diagnosis:  S/P hernia repair [C12.751, Z87.19] Patient Active Problem List   Diagnosis Date Noted   S/P hernia repair 05/27/2022   Chronic allergic conjunctivitis 12/02/2021   Allergic rhinitis due to animal (cat) (dog) hair and dander 12/02/2021   Allergic rhinitis due to pollen 12/02/2021   STEMI (ST elevation myocardial infarction) ([place]) 06/06/2021   History of colonic polyps    Candidiasis of skin 01/04/2020   Ventral hernia without obstruction or gangrene 11/28/2019   Herpes simplex infection 10/27/2019   Stomatitis and mucositis 10/27/2019   Vitamin B12  deficiency 06/30/2018   Unspecified menopausal and perimenopausal disorder 06/30/2018   Vitamin D deficiency 06/30/2018   Acquired hypothyroidism 02/05/2018   Essential hypertension 02/05/2018   Obstructive sleep apnea (adult) (pediatric) 01/06/2018   PCP:  Jonetta Osgood, NP Pharmacy:   [facility] [place], [place] - [place] AT [facility] [address] [place] [address] Phone: [phone] Fax: [phone]  [facility] ([place], [place] [facility] [address] [place] [address] [place] [address] Phone: [phone] Fax: [phone]     Social Determinants of Health (SDOH) Interventions    Readmission Risk Interventions     No data to display

## 2022-06-01 NOTE — Progress Notes (Signed)
Patient ID: Claudia Martin, female   DOB: [birth year], 76 y.o.   MRN: [id number]     [place] Hospital Day(s): 5.   Interval History: Patient seen and examined, no acute events or new complaints overnight. Patient reports feeling bloated.  She also endorses passing gas and having a bowel movement.  He has been able to ambulate from the bed to the bathroom.  Otherwise no higher activity than that.  Endorses tolerating diet but does not want to be advanced to soft food yet.  Vital signs in last 24 hours: [min-max] current  Temp:  [98.2 F (36.8 C)-98.4 F (36.9 C)] 98.2 F (36.8 C) (08/27 0812) Pulse Rate:  [78-87] 79 (08/27 0812) Resp:  [16-20] 16 (08/27 0812) BP: (133-164)/(59-94) 158/87 (08/27 0812) SpO2:  [94 %-98 %] 96 % (08/27 0812)     Height: '4\' 10"'$  (147.3 cm) Weight: 79.4 kg BMI (Calculated): 36.58   Physical Exam:  Constitutional: alert, cooperative and no distress  Respiratory: breathing non-labored at rest  Cardiovascular: regular rate and sinus rhythm  Gastrointestinal: soft, non-tender, and non-distended.  Wound covered by Prevena.  Drain in place with serosanguineous output.  Labs:     Latest Ref Rng & Units 05/31/2022    4:41 AM 05/30/2022    6:01 AM 05/29/2022    5:49 AM  CBC  WBC 4.0 - 10.5 K/uL 5.6  5.3  6.4   Hemoglobin 12.0 - 15.0 g/dL 10.3  9.5  9.1   Hematocrit 36.0 - 46.0 % 30.6  28.7  28.2   Platelets 150 - 400 K/uL 154  132  122       Latest Ref Rng & Units 05/31/2022    4:41 AM 05/30/2022    6:01 AM 05/29/2022    5:49 AM  CMP  Glucose 70 - 99 mg/dL 123  86  103   BUN 8 - 23 mg/dL 15  26  39   Creatinine 0.44 - 1.00 mg/dL 0.66  1.05  1.58   Sodium 135 - 145 mmol/L 142  142  139   Potassium 3.5 - 5.1 mmol/L 3.1  3.5  3.6   Chloride 98 - 111 mmol/L 111  114  109   CO2 22 - 32 mmol/L '26  24  25   '$ Calcium 8.9 - 10.3 mg/dL 8.2  8.1  8.1   Total Protein 6.5 - 8.1 g/dL 6.2     Total Bilirubin 0.3 - 1.2 mg/dL 1.2     Alkaline Phos 38  - 126 U/L 56     AST 15 - 41 U/L 22     ALT 0 - 44 U/L 9       Imaging studies: No new pertinent imaging studies   Assessment/Plan:  76 y.o. female with recurrent ventral hernia 5 Days Post-Op s/p abdominal wall reconstruction, complicated by pertinent comorbidities including hypertension, hypothyroidism, obesity, coronary artery disease status post stent.  Patient continue with stable vital signs.  No fever -Blood pressure mildly elevated will add losartan as home medication dose -Heart rate is better controlled  Recovering slowly from surgery -Pain is fairly controlled -Still not ambulating much, no significant activity other than walking to the bathroom -PT/OT consulted yesterday.  OT evaluated patient.  Still no PT evaluation -Encouraged patient to get out of bed and ambulate -Does not want to get advance to soft diet due to bloating sensation.  Patient passing gas and having bowel movement.  We will continue with DVT prophylaxis  Arnold Long, MD

## 2022-06-02 MED ORDER — OXYCODONE HCL 5 MG PO TABS: 5.0000 mg | ORAL_TABLET | ORAL | 0 refills | Status: DC | PRN

## 2022-06-02 NOTE — Care Management Important Message (Signed)
Important Message  Patient Details  Name: Neely Kammerer MRN: [id number] Date of Birth: [birth year]   Medicare Important Message Given:  Yes     Dannette Barbara 06/02/2022, 12:24 PM

## 2022-06-02 NOTE — Discharge Summary (Signed)
Patient ID: Claudia Martin MRN: [id number] DOB/AGE: 1946/01/31 76 y.o.  Admit date: 05/27/2022 Discharge date: 06/02/2022   Discharge Diagnoses:  Principal Problem:   S/P hernia repair   Procedures:Abdominal wall reconstruction with Bard mesh  w bilateral TAR release  Hospital Course:  76 yo femal w large ventral hernia and abdominal loss of domain underwent AWR.  She had uneventful post op course. Developed mild ileus that subsided. She did have AKI that resolved with fluid administration. At  The time of discharge the patient was ambulating,  pain was controlled.  Her vital signs were stable and she was afebrile.   physical exam at discharge showed a pt  in no acute distress.  Awake and alert.  Abdomen: Soft incision healing well without infection or peritonitis.  Jp serous fluid. I removed Right JP as well as prevena VAC/. Extremities well-perfused and no edema.  Condition of the patient the time of discharge was stable She may go back to ASA and brilinta CAD   Disposition: Discharge disposition: 01-Home or Self Care       Discharge Instructions     (HEART FAILURE PATIENTS) Call MD:  Anytime you have any of the following symptoms: 1) 3 pound weight gain in 24 hours or 5 pounds in 1 week 2) shortness of breath, with or without a dry hacking cough 3) swelling in the hands, feet or stomach 4) if you have to sleep on extra pillows at night in order to breathe.   Complete by: As directed    Call MD for:  difficulty breathing, headache or visual disturbances   Complete by: As directed    Call MD for:  extreme fatigue   Complete by: As directed    Call MD for:  hives   Complete by: As directed    Call MD for:  persistant dizziness or light-headedness   Complete by: As directed    Call MD for:  persistant nausea and vomiting   Complete by: As directed    Call MD for:  redness, tenderness, or signs of infection (pain, swelling, redness, odor or green/yellow discharge around  incision site)   Complete by: As directed    Call MD for:  severe uncontrolled pain   Complete by: As directed    Call MD for:  temperature >100.4   Complete by: As directed    Diet - low sodium heart healthy   Complete by: As directed    Discharge instructions   Complete by: As directed    Please teach pt about JP care. May shower daily   Discharge wound care:   Complete by: As directed    Empty JP drrain BID.   Increase activity slowly   Complete by: As directed    Lifting restrictions   Complete by: As directed    20 lbs x 6 wks      Allergies as of 06/02/2022       Reactions   Other Diarrhea, Hives   Augmentin [amoxicillin-pot Clavulanate]    Bactrim [sulfamethoxazole-trimethoprim] Hives   Doxycycline    Ibuprofen    Other reaction(s): Unknown   Ivp Dye [iodinated Contrast Media] Diarrhea   Macrobid [nitrofurantoin]    Prednisone Nausea Only   Other reaction(s): Dizziness        Medication List     STOP taking these medications    conjugated estrogens 0.625 MG/GM vaginal cream Commonly known as: PREMARIN   predniSONE 50 MG tablet Commonly known as: DELTASONE  TAKE these medications    aspirin 81 MG chewable tablet Chew 1 tablet (81 mg total) by mouth daily.   atorvastatin 80 MG tablet Commonly known as: LIPITOR Take 80 mg by mouth daily.   Brilinta 90 MG Tabs tablet Generic drug: ticagrelor Take 90 mg by mouth 2 (two) times daily.   cetirizine 10 MG tablet Commonly known as: ZYRTEC Take 10 mg by mouth daily.   chlorhexidine 0.12 % solution Commonly known as: PERIDEX USE 5 ML AS DIRECTED IN THE MOUTH OR THROAT TWICE DAILY   diphenhydrAMINE 50 MG tablet Commonly known as: BENADRYL Take 1 tablet (50 mg total) by mouth once for 1 dose. Take one tablet within one hour of contrast injection   EPINEPHrine 0.3 mg/0.3 mL Soaj injection Commonly known as: EPI-PEN as needed.   levothyroxine 50 MCG tablet Commonly known as:  SYNTHROID TAKE 1 TABLET(50 MCG) BY MOUTH DAILY BEFORE AND BREAKFAST   losartan 25 MG tablet Commonly known as: COZAAR Take 1 tablet by mouth daily.   magic mouthwash (nystatin, lidocaine, diphenhydrAMINE) suspension Take 5 mLs by mouth 4 (four) times daily as needed for mouth pain. Swiss and spit   metoprolol tartrate 25 MG tablet Commonly known as: LOPRESSOR Take 1 tablet (25 mg total) by mouth 2 (two) times daily.   nitroGLYCERIN 0.4 MG SL tablet Commonly known as: NITROSTAT Place 1 tablet (0.4 mg total) under the tongue every 5 (five) minutes x 3 doses as needed for chest pain.   oxyCODONE 5 MG immediate release tablet Commonly known as: Oxy IR/ROXICODONE Take 1 tablet (5 mg total) by mouth every 4 (four) hours as needed for severe pain.   rosuvastatin 5 MG tablet Commonly known as: CRESTOR Take 1 tablet (5 mg total) by mouth daily.   valACYclovir 1000 MG tablet Commonly known as: Valtrex TAKE 1 TABLET(1000 MG) BY MOUTH DAILY. CAN TAKE 1 TABLET TWICE DAILY IF FLARE UP FOR 7 DAYS               Discharge Care Instructions  (From admission, onward)           Start     Ordered   06/02/22 0000  Discharge wound care:       Comments: Empty JP drrain BID.   06/02/22 1232            Follow-up Information     Tylene Fantasia, PA-C Follow up on 06/10/2022.   Specialty: Physician Assistant Contact information: [address] [place] [address] [phone]                  Caroleen Hamman, MD FACS

## 2022-06-02 NOTE — TOC Progression Note (Signed)
Transition of Care [facility]) - Progression Note    Patient Details  Name: Claudia Martin MRN: [id number] Date of Birth: [birth year]  [facility] CM/SW Contact  Laurena Slimmer, RN Phone Number: 06/02/2022, 2:06 PM  Clinical Narrative:    Referral sent to [facility] for PT/ OT.    Expected Discharge Plan: [place] Barriers to Discharge: Continued Medical Work up  Expected Discharge Plan and Services Expected Discharge Plan: [place] Choice: [place] arrangements for the past 2 months: Single Family Home Expected Discharge Date: 06/02/22                                     Social Determinants of Health (SDOH) Interventions    Readmission Risk Interventions     No data to display

## 2022-06-02 NOTE — Plan of Care (Signed)
  Problem: Clinical Measurements: Goal: Ability to maintain clinical measurements within normal limits will improve Outcome: Progressing   Problem: Clinical Measurements: Goal: Will remain free from infection Outcome: Progressing   Problem: Clinical Measurements: Goal: Diagnostic test results will improve Outcome: Progressing   

## 2022-06-02 NOTE — Discharge Instructions (Signed)
Open Hernia Repair, Adult, Care After What can I expect after the procedure? After the procedure, it is common to have: Mild discomfort. Slight bruising. Mild swelling. Pain in the belly (abdomen). A small amount of blood from the cut from surgery (incision). Follow these instructions at home: Your doctor may give you more specific instructions. If you have problems, call your doctor. Medicines Take over-the-counter and prescription medicines only as told by your doctor. If told, take steps to prevent problems with pooping (constipation). You may need to: Drink enough fluid to keep your pee (urine) pale yellow. Take medicines. You will be told what medicines to take. Eat foods that are high in fiber. These include beans, whole grains, and fresh fruits and vegetables. Limit foods that are high in fat and sugar. These include fried or sweet foods. Ask your doctor if you should avoid driving or using machines while you are taking your medicine. Incision care  Follow instructions from your doctor about how to take care of your incision. Make sure you: Wash your hands with soap and water for at least 20 seconds before and after you change your bandage (dressing). If you cannot use soap and water, use hand sanitizer. Change your bandage. Leave stitches or skin glue in place for at least 2 weeks. Leave tape strips alone unless you are told to take them off. You may trim the edges of the tape strips if they curl up. Check your incision every day for signs of infection. Check for: More redness, swelling, or pain. More fluid or blood. Warmth. Pus or a bad smell. Wear loose, soft clothing while your incision heals. Activity  Rest as told by your doctor. Do not lift anything that is heavier than 10 lb (4.5 kg), or the limit that you are told. Do not play contact sports until your doctor says that this is safe. If you were given a sedative during your procedure, do not drive or use machines  until your doctor says that it is safe. A sedative is a medicine that helps you relax. Return to your normal activities when your doctor says that it is safe. General instructions Do not take baths, swim, or use a hot tub. Ask your doctor about taking showers or sponge baths. Hold a pillow over your belly when you cough or sneeze. This helps with pain. Do not smoke or use any products that contain nicotine or tobacco. If you need help quitting, ask your doctor. Keep all follow-up visits. Contact a doctor if: You have any of these signs of infection in or around your incision: More redness, swelling, or pain. More fluid or blood. Warmth. Pus. A bad smell. You have a fever or chills. You have blood in your poop (stool). You have not pooped (had a bowel movement) in 2-3 days. Medicine does not help your pain. Get help right away if: You have chest pain, or you are short of breath. You feel faint or light-headed. You have very bad pain. You vomit and your pain is worse. You have pain, swelling, or redness in a leg. These symptoms may be an emergency. Get help right away. Call your local emergency services (911 in the [place] Do not wait to see if the symptoms will go away. Do not drive yourself to the hospital. Summary After this procedure, it is common to have mild discomfort, slight bruising, and mild swelling. Follow instructions from your doctor about how to take care of your cut from surgery (incision). Check every  day for signs of infection. Do not lift heavy objects or play contact sports until your doctor says it is safe. Return to your normal activities as told by your doctor. This information is not intended to replace advice given to you by your health care provider. Make sure you discuss any questions you have with your health care provider.     Surgical Drain Record Surgical drain.   Empty your surgical drain as told by your health care provider. Use this form to  write down the amount of fluid that has collected in the drainage container. Bring this form with you to your follow-up visits. Surgical drain #1 location: ___________________ Date __________ Time __________ Amount __________ Date __________ Time __________ Amount __________ Date __________ Time __________ Amount __________ Date __________ Time __________ Amount __________ Date __________ Time __________ Amount __________ Date __________ Time __________ Amount __________ Date __________ Time __________ Amount __________ Date __________ Time __________ Amount __________ Date __________ Time __________ Amount __________ Date __________ Time __________ Amount __________ Date __________ Time __________ Amount __________ Date __________ Time __________ Amount __________ Date __________ Time __________ Amount __________ Date __________ Time __________ Amount __________ Surgical drain #2 location: ___________________ Date __________ Time __________ Amount __________ Date __________ Time __________ Amount __________ Date __________ Time __________ Amount __________ Date __________ Time __________ Amount __________ Date __________ Time __________ Amount __________ Date __________ Time __________ Amount __________ Date __________ Time __________ Amount __________ Date __________ Time __________ Amount __________ Date __________ Time __________ Amount __________ Date __________ Time __________ Amount __________ Date __________ Time __________ Amount __________ Date __________ Time __________ Amount __________ Date __________ Time __________ Amount __________ Date __________ Time __________ Amount __________ This information is not intended to replace advice given to you by your health care provider. Make sure you discuss any questions you have with your health care provider. Document Revised: 11/19/2020 Document Reviewed: 11/19/2020 Elsevier Patient Education  [employer].

## 2022-06-02 NOTE — Progress Notes (Signed)
Patient being discharged home. IV removed. Went over discharge instructions with patient as well as medications, gave patient her prescription for her oxycodone. Went over surgical site care and JP drain care and instructions. All questions were answered and patient stated that she understood. Patient going POV with family.

## 2022-06-04 DIAGNOSIS — Z7982 Long term (current) use of aspirin: Secondary | ICD-10-CM | POA: Diagnosis not present

## 2022-06-04 DIAGNOSIS — B3731 Acute candidiasis of vulva and vagina: Secondary | ICD-10-CM | POA: Diagnosis not present

## 2022-06-04 DIAGNOSIS — J309 Allergic rhinitis, unspecified: Secondary | ICD-10-CM | POA: Diagnosis not present

## 2022-06-04 DIAGNOSIS — H9209 Otalgia, unspecified ear: Secondary | ICD-10-CM | POA: Diagnosis not present

## 2022-06-04 DIAGNOSIS — I2510 Atherosclerotic heart disease of native coronary artery without angina pectoris: Secondary | ICD-10-CM | POA: Diagnosis not present

## 2022-06-04 DIAGNOSIS — Z48815 Encounter for surgical aftercare following surgery on the digestive system: Secondary | ICD-10-CM | POA: Diagnosis not present

## 2022-06-04 DIAGNOSIS — I252 Old myocardial infarction: Secondary | ICD-10-CM | POA: Diagnosis not present

## 2022-06-04 DIAGNOSIS — G4730 Sleep apnea, unspecified: Secondary | ICD-10-CM | POA: Diagnosis not present

## 2022-06-04 DIAGNOSIS — N1330 Unspecified hydronephrosis: Secondary | ICD-10-CM | POA: Diagnosis not present

## 2022-06-04 DIAGNOSIS — I10 Essential (primary) hypertension: Secondary | ICD-10-CM | POA: Diagnosis not present

## 2022-06-04 DIAGNOSIS — M1990 Unspecified osteoarthritis, unspecified site: Secondary | ICD-10-CM | POA: Diagnosis not present

## 2022-06-04 DIAGNOSIS — N361 Urethral diverticulum: Secondary | ICD-10-CM | POA: Diagnosis not present

## 2022-06-04 DIAGNOSIS — K5790 Diverticulosis of intestine, part unspecified, without perforation or abscess without bleeding: Secondary | ICD-10-CM | POA: Diagnosis not present

## 2022-06-04 DIAGNOSIS — M5430 Sciatica, unspecified side: Secondary | ICD-10-CM | POA: Diagnosis not present

## 2022-06-04 DIAGNOSIS — K219 Gastro-esophageal reflux disease without esophagitis: Secondary | ICD-10-CM | POA: Diagnosis not present

## 2022-06-04 DIAGNOSIS — E039 Hypothyroidism, unspecified: Secondary | ICD-10-CM | POA: Diagnosis not present

## 2022-06-10 ENCOUNTER — Ambulatory Visit (INDEPENDENT_AMBULATORY_CARE_PROVIDER_SITE_OTHER): Payer: Medicare Other | Admitting: Physician Assistant

## 2022-06-10 ENCOUNTER — Encounter: Payer: Self-pay | Admitting: Physician Assistant

## 2022-06-10 VITALS — BP 167/82 | HR 85 | Temp 98.6°F | Wt 171.6 lb

## 2022-06-10 DIAGNOSIS — K432 Incisional hernia without obstruction or gangrene: Secondary | ICD-10-CM

## 2022-06-10 DIAGNOSIS — K430 Incisional hernia with obstruction, without gangrene: Secondary | ICD-10-CM

## 2022-06-10 DIAGNOSIS — Z09 Encounter for follow-up examination after completed treatment for conditions other than malignant neoplasm: Secondary | ICD-10-CM

## 2022-06-10 NOTE — Progress Notes (Signed)
[facility] POST-OP OFFICE VISIT  06/10/2022  HPI: Claudia Martin is a 76 y.o. female 14 days s/p abdominal wall reconstruction with Dr Dahlia Byes  She is doing remarkably well Abdominal soreness; minimal; only needing OTC medication sparingly No fever, chills, nausea, emesis, or bowel changes Remaining surgical drain with <5 ccs daily; serous No issues with midline incision  No other complaints   Vital signs: BP (!) 167/82   Pulse 85   Temp 98.6 F (37 C) (Oral)   Wt 171 lb 9.6 oz (77.8 kg)   SpO2 98%   BMI 35.86 kg/m    Physical Exam: Constitutional: Well appearing female, NAD Abdomen: Soft, non-tender, non-distended, no rebound/guarding. Surgical drain in LLQ (retro-rectus); serous output (removed) Skin: Laparotomy is CDI with staples, these were removed, no erythema or drainage   Assessment/Plan: This is a 76 y.o. female 14 days s/p abdominal wall reconstruction with Dr Dahlia Byes   - Removed drain; occlusive dressing placed. Reviewed instructions  - Removed staples; placed steri-strips  - Pain control prn  - Reviewed wound care recommendation  - Reviewed lifting restrictions; 6 weeks total  - Will follow up in ~1 month for recheck; She understands to call with questions/concerns in the interim   -- Edison Simon, PA-C [facility] 06/10/2022, 3:26 PM M-F: 7am - 4pm

## 2022-06-10 NOTE — Patient Instructions (Signed)
If you have any concerns or questions, please feel free to call our office. See follow up appointment below.  ? ? ?GENERAL POST-OPERATIVE ?PATIENT INSTRUCTIONS  ? ?WOUND CARE INSTRUCTIONS:  Keep a dry clean dressing on the wound if there is drainage. The initial bandage may be removed after 24 hours.  Once the wound has quit draining you may leave it open to air.  If clothing rubs against the wound or causes irritation and the wound is not draining you may cover it with a dry dressing during the daytime.  Try to keep the wound dry and avoid ointments on the wound unless directed to do so.  If the wound becomes bright red and painful or starts to drain infected material that is not clear, please contact your physician immediately.  If the wound is mildly pink and has a thick firm ridge underneath it, this is normal, and is referred to as a healing ridge.  This will resolve over the next 4-6 weeks. ? ?BATHING: ?You may shower if you have been informed of this by your surgeon. However, Please do not submerge in a tub, hot tub, or pool until incisions are completely sealed or have been told by your surgeon that you may do so. ? ?DIET:  You may eat any foods that you can tolerate.  It is a good idea to eat a high fiber diet and take in plenty of fluids to prevent constipation.  If you do become constipated you may want to take a mild laxative or take ducolax tablets on a daily basis until your bowel habits are regular.  Constipation can be very uncomfortable, along with straining, after recent surgery. ? ?ACTIVITY:  You are encouraged to cough and deep breath or use your incentive spirometer if you were given one, every 15-30 minutes when awake.  This will help prevent respiratory complications and low grade fevers post-operatively if you had a general anesthetic.  You may want to hug a pillow when coughing and sneezing to add additional support to the surgical area, if you had abdominal or chest surgery, which will  decrease pain during these times.  You are encouraged to walk and engage in light activity for the next two weeks.  You should not lift more than 20 pounds for 6 weeks total after surgery as it could put you at increased risk for complications.  Twenty pounds is roughly equivalent to a plastic bag of groceries. At that time- Listen to your body when lifting, if you have pain when lifting, stop and then try again in a few days. Soreness after doing exercises or activities of daily living is normal as you get back in to your normal routine. ? ?MEDICATIONS:  Try to take narcotic medications and anti-inflammatory medications, such as tylenol, ibuprofen, naprosyn, etc., with food.  This will minimize stomach upset from the medication.  Should you develop nausea and vomiting from the pain medication, or develop a rash, please discontinue the medication and contact your physician.  You should not drive, make important decisions, or operate machinery when taking narcotic pain medication. ? ?SUNBLOCK ?Use sun block to incision area over the next year if this area will be exposed to sun. This helps decrease scarring and will allow you avoid a permanent darkened area over your incision. ? ?QUESTIONS:  Please feel free to call our office if you have any questions, and we will be glad to assist you. [phone] ? ? ?

## 2022-06-11 ENCOUNTER — Encounter: Payer: Self-pay | Admitting: Medical Oncology

## 2022-06-11 ENCOUNTER — Emergency Department
Admission: EM | Admit: 2022-06-11 | Discharge: 2022-06-11 | Disposition: A | Discharge status: Home / Self Care | Payer: Medicare Other | Attending: Emergency Medicine | Admitting: Emergency Medicine

## 2022-06-11 DIAGNOSIS — T8130XA Disruption of wound, unspecified, initial encounter: Secondary | ICD-10-CM

## 2022-06-11 DIAGNOSIS — G8918 Other acute postprocedural pain: Secondary | ICD-10-CM | POA: Diagnosis not present

## 2022-06-11 DIAGNOSIS — T8131XA Disruption of external operation (surgical) wound, not elsewhere classified, initial encounter: Secondary | ICD-10-CM | POA: Diagnosis not present

## 2022-06-11 DIAGNOSIS — T8132XA Disruption of internal operation (surgical) wound, not elsewhere classified, initial encounter: Secondary | ICD-10-CM | POA: Diagnosis not present

## 2022-06-11 LAB — CBC WITH DIFFERENTIAL/PLATELET
Abs Immature Granulocytes: 0.02 10*3/uL (ref 0.00–0.07)
Basophils Absolute: 0.1 10*3/uL (ref 0.0–0.1)
Basophils Relative: 1 %
Eosinophils Absolute: 1.4 10*3/uL — ABNORMAL HIGH (ref 0.0–0.5)
Eosinophils Relative: 19 %
HCT: 33.2 % — ABNORMAL LOW (ref 36.0–46.0)
Hemoglobin: 11.1 g/dL — ABNORMAL LOW (ref 12.0–15.0)
Immature Granulocytes: 0 %
Lymphocytes Relative: 18 %
Lymphs Abs: 1.3 10*3/uL (ref 0.7–4.0)
MCH: 31.3 pg (ref 26.0–34.0)
MCHC: 33.4 g/dL (ref 30.0–36.0)
MCV: 93.5 fL (ref 80.0–100.0)
Monocytes Absolute: 0.5 10*3/uL (ref 0.1–1.0)
Monocytes Relative: 7 %
Neutro Abs: 4 10*3/uL (ref 1.7–7.7)
Neutrophils Relative %: 55 %
Platelets: 301 10*3/uL (ref 150–400)
RBC: 3.55 MIL/uL — ABNORMAL LOW (ref 3.87–5.11)
RDW: 13.6 % (ref 11.5–15.5)
WBC: 7.3 10*3/uL (ref 4.0–10.5)
nRBC: 0 % (ref 0.0–0.2)

## 2022-06-11 LAB — COMPREHENSIVE METABOLIC PANEL
ALT: 12 U/L (ref 0–44)
AST: 22 U/L (ref 15–41)
Albumin: 3.7 g/dL (ref 3.5–5.0)
Alkaline Phosphatase: 68 U/L (ref 38–126)
Anion gap: 7 (ref 5–15)
BUN: 16 mg/dL (ref 8–23)
CO2: 25 mmol/L (ref 22–32)
Calcium: 8.6 mg/dL — ABNORMAL LOW (ref 8.9–10.3)
Chloride: 108 mmol/L (ref 98–111)
Creatinine, Ser: 0.86 mg/dL (ref 0.44–1.00)
GFR, Estimated: >60 mL/min (ref >60)
Glucose, Bld: 113 mg/dL — ABNORMAL HIGH (ref 70–99)
Potassium: 3.2 mmol/L — ABNORMAL LOW (ref 3.5–5.1)
Sodium: 140 mmol/L (ref 135–145)
Total Bilirubin: 0.5 mg/dL (ref 0.3–1.2)
Total Protein: 6.9 g/dL (ref 6.5–8.1)

## 2022-06-11 LAB — LIPASE, BLOOD: Lipase: 21 U/L (ref 11–51)

## 2022-06-11 MED ORDER — POTASSIUM CHLORIDE CRYS ER 20 MEQ PO TBCR
40.0000 meq | EXTENDED_RELEASE_TABLET | Freq: Once | ORAL | Status: AC
  Administered 2022-06-11: 40 meq via ORAL
  Filled 2022-06-11: qty 2

## 2022-06-11 NOTE — ED Notes (Signed)
Dc instructions reviewed with pt no questions or concerns at this time. Will follow up on 9/19 as scheduled.

## 2022-06-11 NOTE — ED Triage Notes (Signed)
Pt reports she had umbilical hernia surgery on 8/22 by Dr Perrin Maltese. Pt reports she went for f/u yesterday and they removed the drain. This am pt reports that she had a gush of fluid from area and she is concerned. Denies fever. Denies pain also.

## 2022-06-11 NOTE — Discharge Instructions (Signed)
In addition to included general post-operative instructions,  Wound care: Lower abdominal wound needs to be covered daily with dry gauze and secured with tape. This can be changed as needed as well. It is okay to remove the dressing and take short showers. No baths or submerging the wound until cleared by surgery   Follow Up: Follow up with general surgery on 09/19 at 2:15 PM  Call office [phone]) at any time if any questions, worsening pain, fevers/chills, bleeding, drainage from incision site, or other concerns.

## 2022-06-11 NOTE — ED Notes (Signed)
Pt recently had surgery for umbilical hernia, pt had drain removed yesterday and had a gush of fluid from the area this morning.

## 2022-06-11 NOTE — Consult Note (Signed)
[facility] SURGICAL CONSULTATION NOTE (initial) - cpt: [id number]   HISTORY OF PRESENT ILLNESS (HPI):  76 y.o. female presented to [facility] ED today for evaluation of a post-operative complication. Patient underwent abdominal wall reconstruction on 08/22 with Dr Dahlia Byes. I did see her in the clinic yesterday (09/05) for follow up. She was doing well at that time and her drain and staples were removed. However, she reports that she was bending over from the commode today and noticed a "gush of fluid" from her lower incision. She described this as looking clear in nature. She otherwise denied any fever, chills, nausea, emesis, abdominal pain, or bowel changes. No erythema around the wound. Work up in the ED revealed a reassuring WBC at 7.3, Hgb to 11.1 (which is improved from hospital discharge), and CMP and lipase are grossly normal. She did not have imaging.    Surgery is consulted by emergency medicine physician Dr. Marjean Donna, MD in this context for evaluation and management of post-op wound.  PAST MEDICAL HISTORY (PMH):  Past Medical History:  Diagnosis Date   Allergic rhinitis    Aortic atherosclerosis (HCC)    Arthritis    CAD (coronary artery disease) 06/06/2021   a.) STEMI --> LHC: EF 55-65%, 100% RI --> PCI performed placing a 2.5 x 18 mm Onxy Frontier DES   Cellulitis and abscess    Diastolic dysfunction 67/20/9470   a.) TTE 06/07/2021: EF 55-60%, no RWMAs, triv MR, mild AoV sclerosis with no stenosis, G1DD.   Diverticulosis    Enlarged heart    Eyelid inflammation    GERD (gastroesophageal reflux disease)    Hematuria syndrome    Hemorrhoids    Herpes genitalis    Herpes simplex    HTN (hypertension)    Hydronephrosis    Hypothyroidism    Incomplete bladder emptying    Internal hordeolum    Long term current use of antithrombotics/antiplatelets    a.) on daily DAPT therapy (ASA + ticagrelor)   OSA on CPAP    Primary ovarian failure    Sciatica    Shortness of  breath    Sinus bradycardia    STEMI (ST elevation myocardial infarction) ([place]) 06/06/2021   a.) LHC/PCI --> 100% RI (2.5 x 18 m Onyx Frontier DES)   Urethral diverticulum    Vaginal atrophy      PAST SURGICAL HISTORY ([place]):  Past Surgical History:  Procedure Laterality Date   ABDOMINAL WALL DEFECT REPAIR N/A 05/27/2022   Procedure: REPAIR ABDOMINAL WALL, reconstruction;  Surgeon: Jules Husbands, MD;  Location: [facility];  Service: General;  Laterality: N/A;   BREAST BIOPSY     CATARACT EXTRACTION W/PHACO Right 06/05/2021   Procedure: CATARACT EXTRACTION PHACO AND INTRAOCULAR LENS PLACEMENT ([place]) RIGHT;  Surgeon: Leandrew Koyanagi, MD;  Location: [place];  Service: Ophthalmology;  Laterality: Right;  3.02 00:39.5   CATARACT EXTRACTION W/PHACO Left 12/04/2021   Procedure: CATARACT EXTRACTION PHACO AND INTRAOCULAR LENS PLACEMENT ([place]) LEFT;  Surgeon: Leandrew Koyanagi, MD;  Location: [place];  Service: Ophthalmology;  Laterality: Left;  6.23 00:48.0   COLON SURGERY     COLONOSCOPY WITH PROPOFOL N/A 03/15/2018   Procedure: COLONOSCOPY WITH PROPOFOL;  Surgeon: Lin Landsman, MD;  Location[facility];  Service: Gastroenterology;  Laterality: N/A;   COLONOSCOPY WITH PROPOFOL N/A 01/09/2021   Procedure: COLONOSCOPY WITH PROPOFOL;  Surgeon: Lin Landsman, MD;  Location: [facility] ENDOSCOPY;  Service: Gastroenterology;  Laterality: N/A;   CORONARY/GRAFT ACUTE MI REVASCULARIZATION  N/A 06/06/2021   Procedure: Coronary/Graft Acute MI Revascularization;  Surgeon: Yolonda Kida, MD;  Location: [place] [facility];  Service: Cardiovascular;  Laterality: N/A;   ECTOPIC PREGNANCY SURGERY     fx thumb     HERNIA REPAIR     INSERTION OF MESH N/A 05/27/2022   Procedure: INSERTION OF MESH;  Surgeon: Jules Husbands, MD;  Location: [facility] OR[facility];  Service: General;  Laterality: N/A;   LEFT HEART CATH AND CORONARY ANGIOGRAPHY N/A 06/06/2021   Procedure: LEFT HEART  CATH AND CORONARY ANGIOGRAPHY;  Surgeon: Yolonda Kida, MD;  Location: [place] [facility];  Service: Cardiovascular;  Laterality: N/A;   VENTRAL HERNIA REPAIR N/A 05/27/2022   Procedure: HERNIA REPAIR VENTRAL ADULT, recurrent;  Surgeon: Jules Husbands, MD;  Location: [facility];  Service: General;  Laterality: N/A;     MEDICATIONS:  Prior to Admission medications   Medication Sig Start Date End Date Taking? Authorizing Provider  aspirin 81 MG chewable tablet Chew 1 tablet (81 mg total) by mouth daily. 06/10/21   Nolberto Hanlon, MD  atorvastatin (LIPITOR) 80 MG tablet Take 80 mg by mouth daily. 12/18/21   [provider]  BRILINTA 90 MG TABS tablet Take 90 mg by mouth 2 (two) times daily. 11/08/21   [provider]  cetirizine (ZYRTEC) 10 MG tablet Take 10 mg by mouth daily.    [provider]  chlorhexidine (PERIDEX) 0.12 % solution USE 5 ML AS DIRECTED IN THE MOUTH OR THROAT TWICE DAILY 02/25/22   Jonetta Osgood, NP  diphenhydrAMINE (BENADRYL) 50 MG tablet Take 1 tablet (50 mg total) by mouth once for 1 dose. Take one tablet within one hour of contrast injection 04/14/22 05/27/22  Pabon, [place] F, MD  EPINEPHrine 0.3 mg/0.3 mL IJ SOAJ injection as needed. 08/24/14   [provider]  levothyroxine (SYNTHROID) 50 MCG tablet TAKE 1 TABLET(50 MCG) BY MOUTH DAILY BEFORE AND BREAKFAST 02/25/22   Jonetta Osgood, NP  losartan (COZAAR) 25 MG tablet Take 1 tablet by mouth daily. 07/04/21   [provider]  magic mouthwash (nystatin, lidocaine, diphenhydrAMINE) suspension Take 5 mLs by mouth 4 (four) times daily as needed for mouth pain. Swiss and spit 03/14/22   Jonetta Osgood, NP  metoprolol tartrate (LOPRESSOR) 25 MG tablet Take 1 tablet (25 mg total) by mouth 2 (two) times daily. 06/09/21 05/27/22  Nolberto Hanlon, MD  nitroGLYCERIN (NITROSTAT) 0.4 MG SL tablet Place 1 tablet (0.4 mg total) under the tongue every 5 (five) minutes x 3 doses as needed for chest pain.  06/09/21 05/19/22  Nolberto Hanlon, MD  rosuvastatin (CRESTOR) 5 MG tablet Take 1 tablet (5 mg total) by mouth daily. 02/25/22   Jonetta Osgood, NP  valACYclovir (VALTREX) 1000 MG tablet TAKE 1 TABLET(1000 MG) BY MOUTH DAILY. CAN TAKE 1 TABLET TWICE DAILY IF FLARE UP FOR 7 DAYS 12/30/21   Jonetta Osgood, NP     ALLERGIES:  Allergies  Allergen Reactions   Other Diarrhea and Hives   Augmentin [Amoxicillin-Pot Clavulanate]    Bactrim [Sulfamethoxazole-Trimethoprim] Hives   Doxycycline    Ibuprofen     Other reaction(s): Unknown   Ivp Dye [Iodinated Contrast Media] Diarrhea   Macrobid [Nitrofurantoin]    Prednisone Nausea Only    Other reaction(s): Dizziness     SOCIAL HISTORY:  Social History   Socioeconomic History   Marital status: Divorced    Spouse name: Not on file   Number of children: Not on file  Years of education: Not on file   Highest education level: Not on file  Occupational History   Not on file  Tobacco Use   Smoking status: Never    Passive exposure: Never   Smokeless tobacco: Never  Vaping Use   Vaping Use: Never used  Substance and Sexual Activity   Alcohol use: No   Drug use: Never   Sexual activity: Not on file  Other Topics Concern   Not on file  Social History Narrative   Not on file   Social Determinants of Health   Financial Resource Strain: Not on file  Food Insecurity: Not on file  Transportation Needs: Not on file  Physical Activity: Not on file  Stress: Not on file  Social Connections: Not on file  Intimate Partner Violence: Not on file     FAMILY HISTORY:  Family History  Problem Relation Age of Onset   Tuberculosis Mother    Nephrolithiasis Brother    Vision loss Daughter    Hypertension Daughter       REVIEW OF SYSTEMS:  Review of Systems  Constitutional:  Negative for chills and fever.  Respiratory:  Negative for cough and shortness of breath.   Cardiovascular:  Negative for chest pain and palpitations.   Gastrointestinal:  Negative for abdominal pain, diarrhea, nausea and vomiting.  Skin:  Negative for itching and rash.       + Midline wound   All other systems reviewed and are negative.   VITAL SIGNS:  Temp:  [98.4 F (36.9 C)-98.6 F (37 C)] 98.4 F (36.9 C) (09/06 0726) Pulse Rate:  [85-96] 96 (09/06 0726) Resp:  [18] 18 (09/06 0726) BP: (155-167)/(82-87) 155/87 (09/06 0726) SpO2:  [96 %-98 %] 96 % (09/06 0726) Weight:  [77.6 kg-77.8 kg] 77.6 kg (09/06 0727)     Height: '4\' 7"'$  (139.7 cm) Weight: 77.6 kg BMI (Calculated): 39.74   INTAKE/OUTPUT:  No intake/output data recorded.  PHYSICAL EXAM:  Physical Exam Vitals and nursing note reviewed. Exam conducted with a chaperone present.  Constitutional:      General: She is not in acute distress.    Appearance: Normal appearance. She is not ill-appearing.     Comments: Patient resting comfortably in bed; NAD. Daughter at bedside   HENT:     Head: Normocephalic and atraumatic.  Eyes:     General: No scleral icterus.    Conjunctiva/sclera: Conjunctivae normal.  Pulmonary:     Effort: Pulmonary effort is normal. No respiratory distress.  Abdominal:     General: A surgical scar is present. There is no distension.     Palpations: Abdomen is soft.     Tenderness: There is no abdominal tenderness. There is no guarding or rebound.     Comments: Abdomen is soft, non-tender, non-distended, no rebound/guarding. Previous drain site CDI with dressing and Tegaderm. There is a ~4 cm area to the inferior aspect of the midline incisions which appears to have superficially dehisced. There is healing adipose tissue underneath, depth is <5 mm. There is no drainage. There is no erythema. I do not appreciate any fluctuance. There remaining portion of her laparotomy incision is intact. There are steri strips superiorly which were placed yesterday.   Genitourinary:    Comments: Deferred Neurological:     General: No focal deficit present.     Mental  Status: She is alert and oriented to person, place, and time.  Psychiatric:        Mood and Affect: Mood normal.  Behavior: Behavior normal.     Midline Wound (06/11/2022):    Labs:     Latest Ref Rng & Units 06/11/2022    8:38 AM 05/31/2022    4:41 AM 05/30/2022    6:01 AM  CBC  WBC 4.0 - 10.5 K/uL 7.3  5.6  5.3   Hemoglobin 12.0 - 15.0 g/dL 11.1  10.3  9.5   Hematocrit 36.0 - 46.0 % 33.2  30.6  28.7   Platelets 150 - 400 K/uL 301  154  132       Latest Ref Rng & Units 05/31/2022    4:41 AM 05/30/2022    6:01 AM 05/29/2022    5:49 AM  CMP  Glucose 70 - 99 mg/dL 123  86  103   BUN 8 - 23 mg/dL 15  26  39   Creatinine 0.44 - 1.00 mg/dL 0.66  1.05  1.58   Sodium 135 - 145 mmol/L 142  142  139   Potassium 3.5 - 5.1 mmol/L 3.1  3.5  3.6   Chloride 98 - 111 mmol/L 111  114  109   CO2 22 - 32 mmol/L '26  24  25   '$ Calcium 8.9 - 10.3 mg/dL 8.2  8.1  8.1   Total Protein 6.5 - 8.1 g/dL 6.2     Total Bilirubin 0.3 - 1.2 mg/dL 1.2     Alkaline Phos 38 - 126 U/L 56     AST 15 - 41 U/L 22     ALT 0 - 44 U/L 9       Imaging studies:  No new imaging studies   Assessment/Plan: (ICD-10's: T81.30XA) 76 y.o. female with superficial dehiscence of ~4 cm of inferior portion of her laparotomy incision without evidence of infection 15 days s/p abdominal wall reconstruction with Dr Dahlia Byes   - It appears that she has superficially dehisced a small portion of her laparotomy incision on the inferior aspect. There is no depth to this. The wound bed is healing adipose tissue. Certainly no fascia exposed. There is no sign of undrained collections, abscess, nor cellulitis. I do not think this warrants any imaging work up nor antibiotics at this time. She can do superficial dry gauze dressing daily and as needed. There is no depth to pack. Reviewed wound care instructions. Daughter at bedside as well and feels comfortable with dressings. I will expedite follow to 2 weeks from now; appointment made. All  questions addressed and answered.    All of the above findings and recommendations were discussed with the patient and her family (daughter at bedside), and all of their questions were answered to their expressed satisfaction.  Thank you for the opportunity to participate in this patient's care.   -- Edison Simon, PA-C [facility] 06/11/2022, 9:09 AM M-F: 7am - 4pm

## 2022-06-11 NOTE — ED Provider Notes (Addendum)
[facility] Provider Note    Event Date/Time   First MD Initiated Contact with Patient 06/11/22 [phone]     (approximate)   History   Post-op Problem   HPI  Claudia Martin is a 76 y.o. female who recently underwent surgery with Dr. Dahlia Byes.  I reviewed the note from 05/27/2022 where patient had abdominal wall reconstruction with Bard mesh with bilateral TAR release.  Prior to patient's discharge the remove the right JP drain as well as the Prevena VAC.  Patient reports the drain was removed yesterday and that this morning she did gush of fluid from the area and she was concerned.  Patient reports that when she bent over she feels like the skin separated and she has about a 3 inch area on the lower end of the laceration that is opened up with some exposed subcutaneous tissue.  She reports a little bit of bloody drainage coming from it.  She denies any fevers, pain or any other concerns.   Physical Exam   Triage Vital Signs: ED Triage Vitals  Enc Vitals Group     BP 06/11/22 0726 (!) 155/87     Pulse Rate 06/11/22 0726 96     Resp 06/11/22 0726 18     Temp 06/11/22 0726 98.4 F (36.9 C)     Temp Source 06/11/22 0726 Oral     SpO2 06/11/22 0726 96 %     Weight 06/11/22 0727 171 lb (77.6 kg)     Height 06/11/22 0727 '4\' 7"'$  (1.397 m)     Head Circumference --      Peak Flow --      Pain Score 06/11/22 0727 0     Pain Loc --      Pain Edu? --      Excl. in [place]? --     Most recent vital signs: Vitals:   06/11/22 0726  BP: (!) 155/87  Pulse: 96  Resp: 18  Temp: 98.4 F (36.9 C)  SpO2: 96%     General: Awake, no distress.  CV:  Good peripheral perfusion.  Resp:  Normal effort.  Abd:  No distention.  Other:  Patient has about 3 inch separation of tissue with some subcutaneous fat exposed with a little bit of bloody drainage    ED Results / Procedures / Treatments   Labs (all labs ordered are listed, but only abnormal results are  displayed) Labs Reviewed  CBC WITH DIFFERENTIAL/PLATELET  COMPREHENSIVE METABOLIC PANEL  LIPASE, BLOOD  URINALYSIS, ROUTINE W REFLEX MICROSCOPIC    PROCEDURES:  Critical Care performed: No  Procedures   MEDICATIONS ORDERED IN ED: Medications - No data to display   IMPRESSION / MDM / [place] / ED COURSE  I reviewed the triage vital signs and the nursing notes.   Patient's presentation is most consistent with acute presentation with potential threat to life or bodily function.   Differential includes dehiscence, postop infection.  Her abdominal exam is reassuring without any evidence of SBO.  She is got no fever and abdomen is soft and nontender so low suspicion for acute process such as abscess or infection.  No signs of cellulitis on exam..  Will hold off on CT imaging and have the surgery team come take a look at patient.  Patient was seen by Olean Ree PA yesterday we will still have him come evaluate patient.   Pt does have new dehiscence but its not very deep.  He recommends having  dressing changes done and he will follow-up with her outpatient.  CBC is reassuring with stable hemoglobin and stable white count. CMP and lipase are reassuring. Slightly low K will give oral sup before dc.   Given this patient can be discharged home and can follow-up outpatient with surgery and will continue to do dressing changes of this small dehiscence     FINAL CLINICAL IMPRESSION(S) / ED DIAGNOSES   Final diagnoses:  Wound dehiscence     Rx / DC Orders   ED Discharge Orders     None        Note:  This document was prepared using Dragon voice recognition software and may include unintentional dictation errors.   Vanessa [place], MD 06/11/22 2904    Vanessa [place], MD 06/11/22 [phone]

## 2022-06-17 ENCOUNTER — Encounter: Payer: Self-pay | Admitting: Nurse Practitioner

## 2022-06-17 ENCOUNTER — Ambulatory Visit (INDEPENDENT_AMBULATORY_CARE_PROVIDER_SITE_OTHER): Payer: Medicare Other | Admitting: Nurse Practitioner

## 2022-06-17 ENCOUNTER — Telehealth: Payer: Self-pay

## 2022-06-17 VITALS — BP 140/82 | HR 97 | Temp 98.3°F | Resp 16 | Ht <= 58 in | Wt 169.8 lb

## 2022-06-17 DIAGNOSIS — E039 Hypothyroidism, unspecified: Secondary | ICD-10-CM | POA: Diagnosis not present

## 2022-06-17 DIAGNOSIS — I10 Essential (primary) hypertension: Secondary | ICD-10-CM | POA: Diagnosis not present

## 2022-06-17 DIAGNOSIS — Z7982 Long term (current) use of aspirin: Secondary | ICD-10-CM | POA: Diagnosis not present

## 2022-06-17 DIAGNOSIS — M5430 Sciatica, unspecified side: Secondary | ICD-10-CM | POA: Diagnosis not present

## 2022-06-17 DIAGNOSIS — N361 Urethral diverticulum: Secondary | ICD-10-CM | POA: Diagnosis not present

## 2022-06-17 DIAGNOSIS — K219 Gastro-esophageal reflux disease without esophagitis: Secondary | ICD-10-CM | POA: Diagnosis not present

## 2022-06-17 DIAGNOSIS — H9209 Otalgia, unspecified ear: Secondary | ICD-10-CM | POA: Diagnosis not present

## 2022-06-17 DIAGNOSIS — Z48815 Encounter for surgical aftercare following surgery on the digestive system: Secondary | ICD-10-CM | POA: Diagnosis not present

## 2022-06-17 DIAGNOSIS — I2510 Atherosclerotic heart disease of native coronary artery without angina pectoris: Secondary | ICD-10-CM | POA: Diagnosis not present

## 2022-06-17 DIAGNOSIS — Z8719 Personal history of other diseases of the digestive system: Secondary | ICD-10-CM | POA: Diagnosis not present

## 2022-06-17 DIAGNOSIS — J309 Allergic rhinitis, unspecified: Secondary | ICD-10-CM | POA: Diagnosis not present

## 2022-06-17 DIAGNOSIS — G4730 Sleep apnea, unspecified: Secondary | ICD-10-CM | POA: Diagnosis not present

## 2022-06-17 DIAGNOSIS — K5790 Diverticulosis of intestine, part unspecified, without perforation or abscess without bleeding: Secondary | ICD-10-CM | POA: Diagnosis not present

## 2022-06-17 DIAGNOSIS — I252 Old myocardial infarction: Secondary | ICD-10-CM | POA: Diagnosis not present

## 2022-06-17 DIAGNOSIS — M1990 Unspecified osteoarthritis, unspecified site: Secondary | ICD-10-CM | POA: Diagnosis not present

## 2022-06-17 DIAGNOSIS — N1330 Unspecified hydronephrosis: Secondary | ICD-10-CM | POA: Diagnosis not present

## 2022-06-17 DIAGNOSIS — B3731 Acute candidiasis of vulva and vagina: Secondary | ICD-10-CM | POA: Diagnosis not present

## 2022-06-17 MED ORDER — LEVOTHYROXINE SODIUM 50 MCG PO TABS: ORAL_TABLET | ORAL | 1 refills | Status: DC

## 2022-06-17 NOTE — Telephone Encounter (Signed)
Gave verbal order to [facility] [phone] for occupational therapy once a week for 5 weeks

## 2022-06-17 NOTE — Progress Notes (Signed)
[facility], [place] [address]  Internal MEDICINE  Office Visit Note  Patient Name: Claudia Martin  [id number]  [id number]  Date of Service: 06/17/2022  Chief Complaint  Patient presents with   Follow-up   Gastroesophageal Reflux   Hypertension    Follow up med refill    HPI Sala presents for a follow up visit for hypertension, GERD, recent hernia repair.  --Had hernia repair done in august --had ED visit for wound dehisence on 9/6 --Takes 2 tylenol '325mg'$  when pain is 5-6 or greater. Tolerable after that.  --need levothyroxine refills, current dose is effective.  --BP stable with current medications   Current Medication: Outpatient Encounter Medications as of 06/17/2022  Medication Sig Note   aspirin 81 MG chewable tablet Chew 1 tablet (81 mg total) by mouth daily.    atorvastatin (LIPITOR) 80 MG tablet Take 80 mg by mouth daily.    BRILINTA 90 MG TABS tablet Take 90 mg by mouth 2 (two) times daily.    cetirizine (ZYRTEC) 10 MG tablet Take 10 mg by mouth daily.    chlorhexidine (PERIDEX) 0.12 % solution USE 5 ML AS DIRECTED IN THE MOUTH OR THROAT TWICE DAILY    EPINEPHrine 0.3 mg/0.3 mL IJ SOAJ injection as needed.    losartan (COZAAR) 25 MG tablet Take 1 tablet by mouth daily.    magic mouthwash (nystatin, lidocaine, diphenhydrAMINE) suspension Take 5 mLs by mouth 4 (four) times daily as needed for mouth pain. Swiss and spit    valACYclovir (VALTREX) 1000 MG tablet TAKE 1 TABLET(1000 MG) BY MOUTH DAILY. CAN TAKE 1 TABLET TWICE DAILY IF FLARE UP FOR 7 DAYS    [DISCONTINUED] levothyroxine (SYNTHROID) 50 MCG tablet TAKE 1 TABLET(50 MCG) BY MOUTH DAILY BEFORE AND BREAKFAST    [DISCONTINUED] rosuvastatin (CRESTOR) 5 MG tablet Take 1 tablet (5 mg total) by mouth daily.    diphenhydrAMINE (BENADRYL) 50 MG tablet Take 1 tablet (50 mg total) by mouth once for 1 dose. Take one tablet within one hour of contrast injection    levothyroxine  (SYNTHROID) 50 MCG tablet TAKE 1 TABLET(50 MCG) BY MOUTH DAILY BEFORE AND BREAKFAST    metoprolol tartrate (LOPRESSOR) 25 MG tablet Take 1 tablet (25 mg total) by mouth 2 (two) times daily.    nitroGLYCERIN (NITROSTAT) 0.4 MG SL tablet Place 1 tablet (0.4 mg total) under the tongue every 5 (five) minutes x 3 doses as needed for chest pain. 12/04/2021: Never used    No facility-administered encounter medications on file as of 06/17/2022.    Surgical History: Past Surgical History:  Procedure Laterality Date   ABDOMINAL WALL DEFECT REPAIR N/A 05/27/2022   Procedure: REPAIR ABDOMINAL WALL, reconstruction;  Surgeon: Jules Husbands, MD;  Location: [facility] ORS;  Service: General;  Laterality: N/A;   BREAST BIOPSY     CATARACT EXTRACTION W/PHACO Right 06/05/2021   Procedure: CATARACT EXTRACTION PHACO AND INTRAOCULAR LENS PLACEMENT ([place]) RIGHT;  Surgeon: Leandrew Koyanagi, MD;  Location: [place];  Service: Ophthalmology;  Laterality: Right;  3.02 00:39.5   CATARACT EXTRACTION W/PHACO Left 12/04/2021   Procedure: CATARACT EXTRACTION PHACO AND INTRAOCULAR LENS PLACEMENT ([place]) LEFT;  Surgeon: Leandrew Koyanagi, MD;  Location: [place];  Service: Ophthalmology;  Laterality: Left;  6.23 00:48.0   COLON SURGERY     COLONOSCOPY WITH PROPOFOL N/A 03/15/2018   Procedure: COLONOSCOPY WITH PROPOFOL;  Surgeon: Lin Landsman, MD;  Location: [facility]COPY;  Service: Gastroenterology;  Laterality: N/A;  COLONOSCOPY WITH PROPOFOL N/A 01/09/2021   Procedure: COLONOSCOPY WITH PROPOFOL;  Surgeon: Lin Landsman, MD;  Location: [facility] ENDOSCOPY;  Service: Gastroenterology;  Laterality: N/A;   CORONARY/GRAFT ACUTE MI REVASCULARIZATION N/A 06/06/2021   Procedure: Coronary/Graft Acute MI Revascularization;  Surgeon: Yolonda Kida, MD;  Location: [place] CV LAB;  Service: Cardiovascular;  Laterality: N/A;   ECTOPIC PREGNANCY SURGERY     fx thumb     HERNIA REPAIR      INSERTION OF MESH N/A 05/27/2022   Procedure: INSERTION OF MESH;  Surgeon: Jules Husbands, MD;  Location: [facility];  Service: General;  Laterality: N/A;   LEFT HEART CATH AND CORONARY ANGIOGRAPHY N/A 06/06/2021   Procedure: LEFT HEART CATH AND CORONARY ANGIOGRAPHY;  Surgeon: Yolonda Kida, MD;  Location: [place] CV LAB;  Service: Cardiovascular;  Laterality: N/A;   VENTRAL HERNIA REPAIR N/A 05/27/2022   Procedure: HERNIA REPAIR VENTRAL ADULT, recurrent;  Surgeon: Jules Husbands, MD;  Location: [facility];  Service: General;  Laterality: N/A;    Medical History: Past Medical History:  Diagnosis Date   Allergic rhinitis    Aortic atherosclerosis ([place])    Arthritis    CAD (coronary artery disease) 06/06/2021   a.) STEMI --> LHC: EF 55-65%, 100% RI --> PCI performed placing a 2.5 x 18 mm Onxy Frontier DES   Cellulitis and abscess    Diastolic dysfunction 29/79/8921   a.) TTE 06/07/2021: EF 55-60%, no RWMAs, triv MR, mild AoV sclerosis with no stenosis, G1DD.   Diverticulosis    Enlarged heart    Eyelid inflammation    GERD (gastroesophageal reflux disease)    Hematuria syndrome    Hemorrhoids    Herpes genitalis    Herpes simplex    HTN (hypertension)    Hydronephrosis    Hypothyroidism    Incomplete bladder emptying    Internal hordeolum    Long term current use of antithrombotics/antiplatelets    a.) on daily DAPT therapy (ASA + ticagrelor)   OSA on CPAP    Primary ovarian failure    Sciatica    Shortness of breath    Sinus bradycardia    STEMI (ST elevation myocardial infarction) ([place]) 06/06/2021   a.) LHC/PCI --> 100% RI (2.5 x 18 m Onyx Frontier DES)   Urethral diverticulum    Vaginal atrophy     Family History: Family History  Problem Relation Age of Onset   Tuberculosis Mother    Nephrolithiasis Brother    Vision loss Daughter    Hypertension Daughter     Social History   Socioeconomic History   Marital status: Divorced    Spouse name: Not on file    Number of children: Not on file   Years of education: Not on file   Highest education level: Not on file  Occupational History   Not on file  Tobacco Use   Smoking status: Never    Passive exposure: Never   Smokeless tobacco: Never  Vaping Use   Vaping Use: Never used  Substance and Sexual Activity   Alcohol use: No   Drug use: Never   Sexual activity: Not on file  Other Topics Concern   Not on file  Social History Narrative   Not on file   Social Determinants of Health   Financial Resource Strain: Not on file  Food Insecurity: Not on file  Transportation Needs: Not on file  Physical Activity: Not on file  Stress: Not on file  Social  Connections: Not on file  Intimate Partner Violence: Not on file      Review of Systems  Constitutional:  Negative for chills, fatigue and unexpected weight change.  HENT:  Negative for congestion, rhinorrhea, sneezing and sore throat.   Eyes:  Negative for redness.  Respiratory: Negative.  Negative for cough, chest tightness, shortness of breath and wheezing.   Cardiovascular: Negative.  Negative for chest pain and palpitations.  Gastrointestinal:  Negative for abdominal pain, constipation, diarrhea, nausea and vomiting.  Genitourinary:  Negative for dysuria and frequency.  Musculoskeletal:  Negative for arthralgias, back pain, joint swelling and neck pain.  Skin:  Negative for rash.  Neurological: Negative.  Negative for tremors and numbness.  Hematological:  Negative for adenopathy. Does not bruise/bleed easily.  Psychiatric/Behavioral:  Negative for behavioral problems (Depression), sleep disturbance and suicidal ideas. The patient is not nervous/anxious.     Vital Signs: BP (!) 140/82 Comment: 146/80  Pulse 97   Temp 98.3 F (36.8 C)   Resp 16   Ht '4\' 9"'$  (1.448 m)   Wt 169 lb 12.8 oz (77 kg)   SpO2 97%   BMI 36.74 kg/m    Physical Exam Vitals reviewed.  Constitutional:      General: She is not in acute distress.     Appearance: Normal appearance. She is obese. She is not ill-appearing.  HENT:     Head: Normocephalic and atraumatic.  Eyes:     Pupils: Pupils are equal, round, and reactive to light.  Cardiovascular:     Rate and Rhythm: Normal rate and regular rhythm.  Pulmonary:     Effort: Pulmonary effort is normal. No respiratory distress.  Neurological:     Mental Status: She is alert and oriented to person, place, and time.  Psychiatric:        Mood and Affect: Mood normal.        Behavior: Behavior normal.        Assessment/Plan: 1. Acquired hypothyroidism Stable, continue levothyroxine as prescribed - levothyroxine (SYNTHROID) 50 MCG tablet; TAKE 1 TABLET(50 MCG) BY MOUTH DAILY BEFORE AND BREAKFAST  Dispense: 90 tablet; Refill: 1  2. Essential hypertension BP stable, continue medications as prescribed  3. History of abdominal hernia S/p surgical repair, doing better, still recovering, pain is becoming less and less every day. No issues, manageable with OTC medication   General Counseling: krystol rocco understanding of the findings of todays visit and agrees with plan of treatment. I have discussed any further diagnostic evaluation that may be needed or ordered today. We also reviewed her medications today. she has been encouraged to call the office with any questions or concerns that should arise related to todays visit.    No orders of the defined types were placed in this encounter.   Meds ordered this encounter  Medications   levothyroxine (SYNTHROID) 50 MCG tablet    Sig: TAKE 1 TABLET(50 MCG) BY MOUTH DAILY BEFORE AND BREAKFAST    Dispense:  90 tablet    Refill:  1    Return for previously scheduled, CPE, Russell Engelstad PCP in february. .   Total time spent:30 Minutes Time spent includes review of chart, medications, test results, and follow up plan with the patient.   [place] Controlled Substance Database was reviewed by me.  This patient was seen by Jonetta Osgood,  FNP-C in collaboration with Dr. Clayborn Bigness as a part of collaborative care agreement.   Maybell Misenheimer R. Valetta Fuller, MSN, FNP-C Internal medicine

## 2022-06-20 DIAGNOSIS — I2510 Atherosclerotic heart disease of native coronary artery without angina pectoris: Secondary | ICD-10-CM | POA: Diagnosis not present

## 2022-06-20 DIAGNOSIS — Z48815 Encounter for surgical aftercare following surgery on the digestive system: Secondary | ICD-10-CM | POA: Diagnosis not present

## 2022-06-20 DIAGNOSIS — M5430 Sciatica, unspecified side: Secondary | ICD-10-CM | POA: Diagnosis not present

## 2022-06-20 DIAGNOSIS — N1330 Unspecified hydronephrosis: Secondary | ICD-10-CM | POA: Diagnosis not present

## 2022-06-20 DIAGNOSIS — Z7982 Long term (current) use of aspirin: Secondary | ICD-10-CM | POA: Diagnosis not present

## 2022-06-20 DIAGNOSIS — H9209 Otalgia, unspecified ear: Secondary | ICD-10-CM | POA: Diagnosis not present

## 2022-06-20 DIAGNOSIS — K5790 Diverticulosis of intestine, part unspecified, without perforation or abscess without bleeding: Secondary | ICD-10-CM | POA: Diagnosis not present

## 2022-06-20 DIAGNOSIS — K219 Gastro-esophageal reflux disease without esophagitis: Secondary | ICD-10-CM | POA: Diagnosis not present

## 2022-06-20 DIAGNOSIS — B3731 Acute candidiasis of vulva and vagina: Secondary | ICD-10-CM | POA: Diagnosis not present

## 2022-06-20 DIAGNOSIS — I252 Old myocardial infarction: Secondary | ICD-10-CM | POA: Diagnosis not present

## 2022-06-20 DIAGNOSIS — E039 Hypothyroidism, unspecified: Secondary | ICD-10-CM | POA: Diagnosis not present

## 2022-06-20 DIAGNOSIS — M1990 Unspecified osteoarthritis, unspecified site: Secondary | ICD-10-CM | POA: Diagnosis not present

## 2022-06-20 DIAGNOSIS — G4730 Sleep apnea, unspecified: Secondary | ICD-10-CM | POA: Diagnosis not present

## 2022-06-20 DIAGNOSIS — I10 Essential (primary) hypertension: Secondary | ICD-10-CM | POA: Diagnosis not present

## 2022-06-20 DIAGNOSIS — J309 Allergic rhinitis, unspecified: Secondary | ICD-10-CM | POA: Diagnosis not present

## 2022-06-20 DIAGNOSIS — N361 Urethral diverticulum: Secondary | ICD-10-CM | POA: Diagnosis not present

## 2022-06-24 ENCOUNTER — Encounter: Payer: Self-pay | Admitting: Physician Assistant

## 2022-06-24 ENCOUNTER — Ambulatory Visit (INDEPENDENT_AMBULATORY_CARE_PROVIDER_SITE_OTHER): Payer: Medicare Other | Admitting: Physician Assistant

## 2022-06-24 VITALS — BP 136/79 | HR 109 | Temp 98.6°F | Wt 165.0 lb

## 2022-06-24 DIAGNOSIS — K432 Incisional hernia without obstruction or gangrene: Secondary | ICD-10-CM

## 2022-06-24 DIAGNOSIS — T8130XA Disruption of wound, unspecified, initial encounter: Secondary | ICD-10-CM

## 2022-06-24 DIAGNOSIS — Z8719 Personal history of other diseases of the digestive system: Secondary | ICD-10-CM

## 2022-06-24 DIAGNOSIS — Z09 Encounter for follow-up examination after completed treatment for conditions other than malignant neoplasm: Secondary | ICD-10-CM

## 2022-06-24 DIAGNOSIS — T8130XD Disruption of wound, unspecified, subsequent encounter: Secondary | ICD-10-CM

## 2022-06-24 DIAGNOSIS — K430 Incisional hernia with obstruction, without gangrene: Secondary | ICD-10-CM

## 2022-06-24 NOTE — Progress Notes (Signed)
[facility] POST-OP OFFICE VISIT  06/24/2022  HPI: Claudia Martin is a 76 y.o. female 28 days s/p abdominal wall reconstruction, which was complicated by superficial dehiscence of ~4 cm of inferior portion of her laparotomy incision   She had been ding better until yesterday. She reports that she was taking a nap and was startled by a phone call and quickly tried to get up/twist and noticed a significant increase in abdominal pain. This is primarily at the top of her abdomen/incision. She denied any fever, chills, nausea, emesis, or bowel changes. Still with decreased appetite. Lower portion of her wound which superficially dehisced is closing slowing. Doing superficial dressings.   Vital signs: BP 136/79   Pulse (!) 109   Temp 98.6 F (37 C) (Oral)   Wt 165 lb (74.8 kg)   SpO2 97%   BMI 35.71 kg/m    Physical Exam: Constitutional: Well appearing female, NAD Abdomen: There is an area of firmness to the upper portion of her laparotomy wound on the right, this feels like it could potentially be palpable mesh vs scarring, there is no fluctuance or evidence of abscess and does not appear infected. The remaining areas of the wound/abdomen are appropriately soft.  Skin: ~4-5 cm area of superficial dehiscence to the inferior portion of her laparotomy wound, this is closely appropriately, wound bed is healthy granulation tissue. The remainder of her laparotomy incision is healing well.    Assessment/Plan: This is a 76 y.o. female 28 days s/p abdominal wall reconstruction, which was complicated by superficial dehiscence of ~4 cm of inferior portion of her laparotomy incision    - Pain control prn; Using Tylenol  - Reviewed wound care recommendation; continue superficial dressings  - Reviewed lifting restrictions; 6 weeks total  - I will have her see Dr Dahlia Byes next week to ensure there have been no issues or changes with her mesh/repair; She understands to call with  questions/concerns in the interim  -- Edison Simon, PA-C [facility] 06/24/2022, 2:37 PM M-F: 7am - 4pm

## 2022-06-24 NOTE — Patient Instructions (Signed)
If you have any concerns or questions, please feel free to call our office. See follow up appointment below. 

## 2022-06-25 DIAGNOSIS — K219 Gastro-esophageal reflux disease without esophagitis: Secondary | ICD-10-CM | POA: Diagnosis not present

## 2022-06-25 DIAGNOSIS — N361 Urethral diverticulum: Secondary | ICD-10-CM | POA: Diagnosis not present

## 2022-06-25 DIAGNOSIS — I252 Old myocardial infarction: Secondary | ICD-10-CM | POA: Diagnosis not present

## 2022-06-25 DIAGNOSIS — M1990 Unspecified osteoarthritis, unspecified site: Secondary | ICD-10-CM | POA: Diagnosis not present

## 2022-06-25 DIAGNOSIS — Z7982 Long term (current) use of aspirin: Secondary | ICD-10-CM | POA: Diagnosis not present

## 2022-06-25 DIAGNOSIS — I10 Essential (primary) hypertension: Secondary | ICD-10-CM | POA: Diagnosis not present

## 2022-06-25 DIAGNOSIS — M5430 Sciatica, unspecified side: Secondary | ICD-10-CM | POA: Diagnosis not present

## 2022-06-25 DIAGNOSIS — K5790 Diverticulosis of intestine, part unspecified, without perforation or abscess without bleeding: Secondary | ICD-10-CM | POA: Diagnosis not present

## 2022-06-25 DIAGNOSIS — N1330 Unspecified hydronephrosis: Secondary | ICD-10-CM | POA: Diagnosis not present

## 2022-06-25 DIAGNOSIS — J309 Allergic rhinitis, unspecified: Secondary | ICD-10-CM | POA: Diagnosis not present

## 2022-06-25 DIAGNOSIS — Z48815 Encounter for surgical aftercare following surgery on the digestive system: Secondary | ICD-10-CM | POA: Diagnosis not present

## 2022-06-25 DIAGNOSIS — I2510 Atherosclerotic heart disease of native coronary artery without angina pectoris: Secondary | ICD-10-CM | POA: Diagnosis not present

## 2022-06-25 DIAGNOSIS — H9209 Otalgia, unspecified ear: Secondary | ICD-10-CM | POA: Diagnosis not present

## 2022-06-25 DIAGNOSIS — E039 Hypothyroidism, unspecified: Secondary | ICD-10-CM | POA: Diagnosis not present

## 2022-06-25 DIAGNOSIS — G4730 Sleep apnea, unspecified: Secondary | ICD-10-CM | POA: Diagnosis not present

## 2022-06-25 DIAGNOSIS — B3731 Acute candidiasis of vulva and vagina: Secondary | ICD-10-CM | POA: Diagnosis not present

## 2022-06-26 DIAGNOSIS — I252 Old myocardial infarction: Secondary | ICD-10-CM | POA: Diagnosis not present

## 2022-06-26 DIAGNOSIS — N361 Urethral diverticulum: Secondary | ICD-10-CM | POA: Diagnosis not present

## 2022-06-26 DIAGNOSIS — B3731 Acute candidiasis of vulva and vagina: Secondary | ICD-10-CM | POA: Diagnosis not present

## 2022-06-26 DIAGNOSIS — I2510 Atherosclerotic heart disease of native coronary artery without angina pectoris: Secondary | ICD-10-CM | POA: Diagnosis not present

## 2022-06-26 DIAGNOSIS — M1990 Unspecified osteoarthritis, unspecified site: Secondary | ICD-10-CM | POA: Diagnosis not present

## 2022-06-26 DIAGNOSIS — N1330 Unspecified hydronephrosis: Secondary | ICD-10-CM | POA: Diagnosis not present

## 2022-06-26 DIAGNOSIS — K219 Gastro-esophageal reflux disease without esophagitis: Secondary | ICD-10-CM | POA: Diagnosis not present

## 2022-06-26 DIAGNOSIS — H9209 Otalgia, unspecified ear: Secondary | ICD-10-CM | POA: Diagnosis not present

## 2022-06-26 DIAGNOSIS — J309 Allergic rhinitis, unspecified: Secondary | ICD-10-CM | POA: Diagnosis not present

## 2022-06-26 DIAGNOSIS — M5430 Sciatica, unspecified side: Secondary | ICD-10-CM | POA: Diagnosis not present

## 2022-06-26 DIAGNOSIS — E039 Hypothyroidism, unspecified: Secondary | ICD-10-CM | POA: Diagnosis not present

## 2022-06-26 DIAGNOSIS — Z7982 Long term (current) use of aspirin: Secondary | ICD-10-CM | POA: Diagnosis not present

## 2022-06-26 DIAGNOSIS — I10 Essential (primary) hypertension: Secondary | ICD-10-CM | POA: Diagnosis not present

## 2022-06-26 DIAGNOSIS — K5790 Diverticulosis of intestine, part unspecified, without perforation or abscess without bleeding: Secondary | ICD-10-CM | POA: Diagnosis not present

## 2022-06-26 DIAGNOSIS — Z48815 Encounter for surgical aftercare following surgery on the digestive system: Secondary | ICD-10-CM | POA: Diagnosis not present

## 2022-06-26 DIAGNOSIS — G4730 Sleep apnea, unspecified: Secondary | ICD-10-CM | POA: Diagnosis not present

## 2022-06-30 ENCOUNTER — Telehealth: Payer: Self-pay | Admitting: Nurse Practitioner

## 2022-06-30 NOTE — Telephone Encounter (Signed)
Received homehealth order from [facility]. Gave to Alyssa for signature-Toni

## 2022-07-02 ENCOUNTER — Ambulatory Visit (INDEPENDENT_AMBULATORY_CARE_PROVIDER_SITE_OTHER): Payer: Medicare Other | Admitting: Surgery

## 2022-07-02 ENCOUNTER — Other Ambulatory Visit: Payer: Self-pay

## 2022-07-02 ENCOUNTER — Encounter: Payer: Self-pay | Admitting: Surgery

## 2022-07-02 VITALS — BP 144/81 | HR 93 | Temp 98.6°F | Ht <= 58 in | Wt 166.0 lb

## 2022-07-02 DIAGNOSIS — Z09 Encounter for follow-up examination after completed treatment for conditions other than malignant neoplasm: Secondary | ICD-10-CM

## 2022-07-02 DIAGNOSIS — K430 Incisional hernia with obstruction, without gangrene: Secondary | ICD-10-CM

## 2022-07-02 DIAGNOSIS — J309 Allergic rhinitis, unspecified: Secondary | ICD-10-CM | POA: Insufficient documentation

## 2022-07-02 NOTE — Patient Instructions (Addendum)
We will have you follow up here in 3 months.   Change your dressing daily or as often as needed.    Please call and ask to speak with a nurse if you develop questions or concerns.

## 2022-07-02 NOTE — Progress Notes (Signed)
Claudia Martin is a month out from abdominal wall reconstruction for a complex abdominal wall hernia.  She has done well.  She did have a very small superficial dehiscence on the inferior portion of the wound measuring about 5 cm.  She has been doing wound care and now is close to completely healed.  No fevers no chills she is doing very well  PE: NAD Abd: Soft there is no evidence of recurrence.  Robust abdominal wall.  There is evidence of a very superficial wound measuring 4 cm suprapubic area.  This has no depth.  A/P  Doing very well after abdominal wall reconstruction.  Minor dehiscence.  I will see her back in a couple months.  No evidence of complications

## 2022-07-03 ENCOUNTER — Telehealth: Payer: Self-pay

## 2022-07-03 DIAGNOSIS — I252 Old myocardial infarction: Secondary | ICD-10-CM | POA: Diagnosis not present

## 2022-07-03 DIAGNOSIS — E039 Hypothyroidism, unspecified: Secondary | ICD-10-CM | POA: Diagnosis not present

## 2022-07-03 DIAGNOSIS — M5430 Sciatica, unspecified side: Secondary | ICD-10-CM | POA: Diagnosis not present

## 2022-07-03 DIAGNOSIS — G4730 Sleep apnea, unspecified: Secondary | ICD-10-CM | POA: Diagnosis not present

## 2022-07-03 DIAGNOSIS — I10 Essential (primary) hypertension: Secondary | ICD-10-CM | POA: Diagnosis not present

## 2022-07-03 DIAGNOSIS — H9209 Otalgia, unspecified ear: Secondary | ICD-10-CM | POA: Diagnosis not present

## 2022-07-03 DIAGNOSIS — N1330 Unspecified hydronephrosis: Secondary | ICD-10-CM | POA: Diagnosis not present

## 2022-07-03 DIAGNOSIS — J309 Allergic rhinitis, unspecified: Secondary | ICD-10-CM | POA: Diagnosis not present

## 2022-07-03 DIAGNOSIS — N361 Urethral diverticulum: Secondary | ICD-10-CM | POA: Diagnosis not present

## 2022-07-03 DIAGNOSIS — Z48815 Encounter for surgical aftercare following surgery on the digestive system: Secondary | ICD-10-CM | POA: Diagnosis not present

## 2022-07-03 DIAGNOSIS — Z7982 Long term (current) use of aspirin: Secondary | ICD-10-CM | POA: Diagnosis not present

## 2022-07-03 DIAGNOSIS — K5790 Diverticulosis of intestine, part unspecified, without perforation or abscess without bleeding: Secondary | ICD-10-CM | POA: Diagnosis not present

## 2022-07-03 DIAGNOSIS — M1990 Unspecified osteoarthritis, unspecified site: Secondary | ICD-10-CM | POA: Diagnosis not present

## 2022-07-03 DIAGNOSIS — B3731 Acute candidiasis of vulva and vagina: Secondary | ICD-10-CM | POA: Diagnosis not present

## 2022-07-03 DIAGNOSIS — K219 Gastro-esophageal reflux disease without esophagitis: Secondary | ICD-10-CM | POA: Diagnosis not present

## 2022-07-03 DIAGNOSIS — I2510 Atherosclerotic heart disease of native coronary artery without angina pectoris: Secondary | ICD-10-CM | POA: Diagnosis not present

## 2022-07-03 NOTE — Telephone Encounter (Signed)
Colletta [place] [phone]) from [facility] called and checked on PT and OT orders she faxed over, per Alyssa she advised she will have them completed later today 07/03/22.

## 2022-07-07 ENCOUNTER — Telehealth: Payer: Self-pay | Admitting: Nurse Practitioner

## 2022-07-07 DIAGNOSIS — M5430 Sciatica, unspecified side: Secondary | ICD-10-CM | POA: Diagnosis not present

## 2022-07-07 DIAGNOSIS — B3731 Acute candidiasis of vulva and vagina: Secondary | ICD-10-CM | POA: Diagnosis not present

## 2022-07-07 DIAGNOSIS — M1990 Unspecified osteoarthritis, unspecified site: Secondary | ICD-10-CM | POA: Diagnosis not present

## 2022-07-07 DIAGNOSIS — I2510 Atherosclerotic heart disease of native coronary artery without angina pectoris: Secondary | ICD-10-CM | POA: Diagnosis not present

## 2022-07-07 DIAGNOSIS — J309 Allergic rhinitis, unspecified: Secondary | ICD-10-CM | POA: Diagnosis not present

## 2022-07-07 DIAGNOSIS — K219 Gastro-esophageal reflux disease without esophagitis: Secondary | ICD-10-CM | POA: Diagnosis not present

## 2022-07-07 DIAGNOSIS — E039 Hypothyroidism, unspecified: Secondary | ICD-10-CM | POA: Diagnosis not present

## 2022-07-07 DIAGNOSIS — N361 Urethral diverticulum: Secondary | ICD-10-CM | POA: Diagnosis not present

## 2022-07-07 DIAGNOSIS — N1330 Unspecified hydronephrosis: Secondary | ICD-10-CM | POA: Diagnosis not present

## 2022-07-07 DIAGNOSIS — Z48815 Encounter for surgical aftercare following surgery on the digestive system: Secondary | ICD-10-CM | POA: Diagnosis not present

## 2022-07-07 DIAGNOSIS — K5790 Diverticulosis of intestine, part unspecified, without perforation or abscess without bleeding: Secondary | ICD-10-CM | POA: Diagnosis not present

## 2022-07-07 DIAGNOSIS — H9209 Otalgia, unspecified ear: Secondary | ICD-10-CM | POA: Diagnosis not present

## 2022-07-07 DIAGNOSIS — I252 Old myocardial infarction: Secondary | ICD-10-CM | POA: Diagnosis not present

## 2022-07-07 DIAGNOSIS — I10 Essential (primary) hypertension: Secondary | ICD-10-CM | POA: Diagnosis not present

## 2022-07-07 DIAGNOSIS — Z7982 Long term (current) use of aspirin: Secondary | ICD-10-CM | POA: Diagnosis not present

## 2022-07-07 DIAGNOSIS — G4730 Sleep apnea, unspecified: Secondary | ICD-10-CM | POA: Diagnosis not present

## 2022-07-07 NOTE — Telephone Encounter (Signed)
06/04/22 orders signed. Faxed back to [facility]-[phone]-Toni

## 2022-07-08 DIAGNOSIS — Z7982 Long term (current) use of aspirin: Secondary | ICD-10-CM | POA: Diagnosis not present

## 2022-07-08 DIAGNOSIS — G4733 Obstructive sleep apnea (adult) (pediatric): Secondary | ICD-10-CM | POA: Diagnosis not present

## 2022-07-08 DIAGNOSIS — J309 Allergic rhinitis, unspecified: Secondary | ICD-10-CM | POA: Diagnosis not present

## 2022-07-08 DIAGNOSIS — M5430 Sciatica, unspecified side: Secondary | ICD-10-CM | POA: Diagnosis not present

## 2022-07-08 DIAGNOSIS — G4730 Sleep apnea, unspecified: Secondary | ICD-10-CM | POA: Diagnosis not present

## 2022-07-08 DIAGNOSIS — E782 Mixed hyperlipidemia: Secondary | ICD-10-CM | POA: Diagnosis not present

## 2022-07-08 DIAGNOSIS — Z48815 Encounter for surgical aftercare following surgery on the digestive system: Secondary | ICD-10-CM | POA: Diagnosis not present

## 2022-07-08 DIAGNOSIS — E039 Hypothyroidism, unspecified: Secondary | ICD-10-CM | POA: Diagnosis not present

## 2022-07-08 DIAGNOSIS — Z955 Presence of coronary angioplasty implant and graft: Secondary | ICD-10-CM | POA: Diagnosis not present

## 2022-07-08 DIAGNOSIS — J3089 Other allergic rhinitis: Secondary | ICD-10-CM | POA: Diagnosis not present

## 2022-07-08 DIAGNOSIS — K219 Gastro-esophageal reflux disease without esophagitis: Secondary | ICD-10-CM | POA: Diagnosis not present

## 2022-07-08 DIAGNOSIS — J3081 Allergic rhinitis due to animal (cat) (dog) hair and dander: Secondary | ICD-10-CM | POA: Diagnosis not present

## 2022-07-08 DIAGNOSIS — I2121 ST elevation (STEMI) myocardial infarction involving left circumflex coronary artery: Secondary | ICD-10-CM | POA: Diagnosis not present

## 2022-07-08 DIAGNOSIS — H9209 Otalgia, unspecified ear: Secondary | ICD-10-CM | POA: Diagnosis not present

## 2022-07-08 DIAGNOSIS — K5790 Diverticulosis of intestine, part unspecified, without perforation or abscess without bleeding: Secondary | ICD-10-CM | POA: Diagnosis not present

## 2022-07-08 DIAGNOSIS — B3731 Acute candidiasis of vulva and vagina: Secondary | ICD-10-CM | POA: Diagnosis not present

## 2022-07-08 DIAGNOSIS — I252 Old myocardial infarction: Secondary | ICD-10-CM | POA: Diagnosis not present

## 2022-07-08 DIAGNOSIS — I10 Essential (primary) hypertension: Secondary | ICD-10-CM | POA: Diagnosis not present

## 2022-07-08 DIAGNOSIS — J301 Allergic rhinitis due to pollen: Secondary | ICD-10-CM | POA: Diagnosis not present

## 2022-07-08 DIAGNOSIS — N361 Urethral diverticulum: Secondary | ICD-10-CM | POA: Diagnosis not present

## 2022-07-08 DIAGNOSIS — N1330 Unspecified hydronephrosis: Secondary | ICD-10-CM | POA: Diagnosis not present

## 2022-07-08 DIAGNOSIS — M1990 Unspecified osteoarthritis, unspecified site: Secondary | ICD-10-CM | POA: Diagnosis not present

## 2022-07-08 DIAGNOSIS — I2510 Atherosclerotic heart disease of native coronary artery without angina pectoris: Secondary | ICD-10-CM | POA: Diagnosis not present

## 2022-07-10 ENCOUNTER — Encounter: Payer: Medicare Other | Admitting: Physician Assistant

## 2022-07-14 DIAGNOSIS — K5790 Diverticulosis of intestine, part unspecified, without perforation or abscess without bleeding: Secondary | ICD-10-CM | POA: Diagnosis not present

## 2022-07-14 DIAGNOSIS — H9209 Otalgia, unspecified ear: Secondary | ICD-10-CM | POA: Diagnosis not present

## 2022-07-14 DIAGNOSIS — Z48815 Encounter for surgical aftercare following surgery on the digestive system: Secondary | ICD-10-CM | POA: Diagnosis not present

## 2022-07-14 DIAGNOSIS — B3731 Acute candidiasis of vulva and vagina: Secondary | ICD-10-CM | POA: Diagnosis not present

## 2022-07-14 DIAGNOSIS — K219 Gastro-esophageal reflux disease without esophagitis: Secondary | ICD-10-CM | POA: Diagnosis not present

## 2022-07-14 DIAGNOSIS — Z7982 Long term (current) use of aspirin: Secondary | ICD-10-CM | POA: Diagnosis not present

## 2022-07-14 DIAGNOSIS — I10 Essential (primary) hypertension: Secondary | ICD-10-CM | POA: Diagnosis not present

## 2022-07-14 DIAGNOSIS — G4730 Sleep apnea, unspecified: Secondary | ICD-10-CM | POA: Diagnosis not present

## 2022-07-14 DIAGNOSIS — E039 Hypothyroidism, unspecified: Secondary | ICD-10-CM | POA: Diagnosis not present

## 2022-07-14 DIAGNOSIS — I2510 Atherosclerotic heart disease of native coronary artery without angina pectoris: Secondary | ICD-10-CM | POA: Diagnosis not present

## 2022-07-14 DIAGNOSIS — J309 Allergic rhinitis, unspecified: Secondary | ICD-10-CM | POA: Diagnosis not present

## 2022-07-14 DIAGNOSIS — M1990 Unspecified osteoarthritis, unspecified site: Secondary | ICD-10-CM | POA: Diagnosis not present

## 2022-07-14 DIAGNOSIS — I252 Old myocardial infarction: Secondary | ICD-10-CM | POA: Diagnosis not present

## 2022-07-14 DIAGNOSIS — N1330 Unspecified hydronephrosis: Secondary | ICD-10-CM | POA: Diagnosis not present

## 2022-07-14 DIAGNOSIS — M5430 Sciatica, unspecified side: Secondary | ICD-10-CM | POA: Diagnosis not present

## 2022-07-14 DIAGNOSIS — N361 Urethral diverticulum: Secondary | ICD-10-CM | POA: Diagnosis not present

## 2022-07-17 DIAGNOSIS — J3081 Allergic rhinitis due to animal (cat) (dog) hair and dander: Secondary | ICD-10-CM | POA: Diagnosis not present

## 2022-07-17 DIAGNOSIS — J301 Allergic rhinitis due to pollen: Secondary | ICD-10-CM | POA: Diagnosis not present

## 2022-07-17 DIAGNOSIS — Z13820 Encounter for screening for osteoporosis: Secondary | ICD-10-CM | POA: Diagnosis not present

## 2022-07-17 DIAGNOSIS — Z1231 Encounter for screening mammogram for malignant neoplasm of breast: Secondary | ICD-10-CM | POA: Diagnosis not present

## 2022-07-17 DIAGNOSIS — Z780 Asymptomatic menopausal state: Secondary | ICD-10-CM | POA: Diagnosis not present

## 2022-07-17 DIAGNOSIS — J3089 Other allergic rhinitis: Secondary | ICD-10-CM | POA: Diagnosis not present

## 2022-07-18 DIAGNOSIS — M5430 Sciatica, unspecified side: Secondary | ICD-10-CM | POA: Diagnosis not present

## 2022-07-18 DIAGNOSIS — J309 Allergic rhinitis, unspecified: Secondary | ICD-10-CM | POA: Diagnosis not present

## 2022-07-18 DIAGNOSIS — Z48815 Encounter for surgical aftercare following surgery on the digestive system: Secondary | ICD-10-CM | POA: Diagnosis not present

## 2022-07-18 DIAGNOSIS — G4730 Sleep apnea, unspecified: Secondary | ICD-10-CM | POA: Diagnosis not present

## 2022-07-18 DIAGNOSIS — I10 Essential (primary) hypertension: Secondary | ICD-10-CM | POA: Diagnosis not present

## 2022-07-18 DIAGNOSIS — K5790 Diverticulosis of intestine, part unspecified, without perforation or abscess without bleeding: Secondary | ICD-10-CM | POA: Diagnosis not present

## 2022-07-18 DIAGNOSIS — B3731 Acute candidiasis of vulva and vagina: Secondary | ICD-10-CM | POA: Diagnosis not present

## 2022-07-18 DIAGNOSIS — E039 Hypothyroidism, unspecified: Secondary | ICD-10-CM | POA: Diagnosis not present

## 2022-07-18 DIAGNOSIS — K219 Gastro-esophageal reflux disease without esophagitis: Secondary | ICD-10-CM | POA: Diagnosis not present

## 2022-07-18 DIAGNOSIS — N361 Urethral diverticulum: Secondary | ICD-10-CM | POA: Diagnosis not present

## 2022-07-18 DIAGNOSIS — I252 Old myocardial infarction: Secondary | ICD-10-CM | POA: Diagnosis not present

## 2022-07-18 DIAGNOSIS — I2510 Atherosclerotic heart disease of native coronary artery without angina pectoris: Secondary | ICD-10-CM | POA: Diagnosis not present

## 2022-07-18 DIAGNOSIS — Z7982 Long term (current) use of aspirin: Secondary | ICD-10-CM | POA: Diagnosis not present

## 2022-07-18 DIAGNOSIS — M1990 Unspecified osteoarthritis, unspecified site: Secondary | ICD-10-CM | POA: Diagnosis not present

## 2022-07-18 DIAGNOSIS — H9209 Otalgia, unspecified ear: Secondary | ICD-10-CM | POA: Diagnosis not present

## 2022-07-18 DIAGNOSIS — N1330 Unspecified hydronephrosis: Secondary | ICD-10-CM | POA: Diagnosis not present

## 2022-07-21 DIAGNOSIS — K5790 Diverticulosis of intestine, part unspecified, without perforation or abscess without bleeding: Secondary | ICD-10-CM | POA: Diagnosis not present

## 2022-07-21 DIAGNOSIS — Z7982 Long term (current) use of aspirin: Secondary | ICD-10-CM | POA: Diagnosis not present

## 2022-07-21 DIAGNOSIS — G4730 Sleep apnea, unspecified: Secondary | ICD-10-CM | POA: Diagnosis not present

## 2022-07-21 DIAGNOSIS — I2510 Atherosclerotic heart disease of native coronary artery without angina pectoris: Secondary | ICD-10-CM | POA: Diagnosis not present

## 2022-07-21 DIAGNOSIS — J309 Allergic rhinitis, unspecified: Secondary | ICD-10-CM | POA: Diagnosis not present

## 2022-07-21 DIAGNOSIS — I10 Essential (primary) hypertension: Secondary | ICD-10-CM | POA: Diagnosis not present

## 2022-07-21 DIAGNOSIS — N361 Urethral diverticulum: Secondary | ICD-10-CM | POA: Diagnosis not present

## 2022-07-21 DIAGNOSIS — K219 Gastro-esophageal reflux disease without esophagitis: Secondary | ICD-10-CM | POA: Diagnosis not present

## 2022-07-21 DIAGNOSIS — M5430 Sciatica, unspecified side: Secondary | ICD-10-CM | POA: Diagnosis not present

## 2022-07-21 DIAGNOSIS — Z48815 Encounter for surgical aftercare following surgery on the digestive system: Secondary | ICD-10-CM | POA: Diagnosis not present

## 2022-07-21 DIAGNOSIS — B3731 Acute candidiasis of vulva and vagina: Secondary | ICD-10-CM | POA: Diagnosis not present

## 2022-07-21 DIAGNOSIS — E039 Hypothyroidism, unspecified: Secondary | ICD-10-CM | POA: Diagnosis not present

## 2022-07-21 DIAGNOSIS — I252 Old myocardial infarction: Secondary | ICD-10-CM | POA: Diagnosis not present

## 2022-07-21 DIAGNOSIS — M1990 Unspecified osteoarthritis, unspecified site: Secondary | ICD-10-CM | POA: Diagnosis not present

## 2022-07-21 DIAGNOSIS — N1330 Unspecified hydronephrosis: Secondary | ICD-10-CM | POA: Diagnosis not present

## 2022-07-21 DIAGNOSIS — H9209 Otalgia, unspecified ear: Secondary | ICD-10-CM | POA: Diagnosis not present

## 2022-07-22 DIAGNOSIS — G4733 Obstructive sleep apnea (adult) (pediatric): Secondary | ICD-10-CM | POA: Diagnosis not present

## 2022-07-24 ENCOUNTER — Ambulatory Visit: Payer: Medicare Other | Admitting: Nurse Practitioner

## 2022-07-29 DIAGNOSIS — I252 Old myocardial infarction: Secondary | ICD-10-CM | POA: Diagnosis not present

## 2022-07-29 DIAGNOSIS — J309 Allergic rhinitis, unspecified: Secondary | ICD-10-CM | POA: Diagnosis not present

## 2022-07-29 DIAGNOSIS — I2510 Atherosclerotic heart disease of native coronary artery without angina pectoris: Secondary | ICD-10-CM | POA: Diagnosis not present

## 2022-07-29 DIAGNOSIS — Z48815 Encounter for surgical aftercare following surgery on the digestive system: Secondary | ICD-10-CM | POA: Diagnosis not present

## 2022-07-29 DIAGNOSIS — K219 Gastro-esophageal reflux disease without esophagitis: Secondary | ICD-10-CM | POA: Diagnosis not present

## 2022-07-29 DIAGNOSIS — M5430 Sciatica, unspecified side: Secondary | ICD-10-CM | POA: Diagnosis not present

## 2022-07-29 DIAGNOSIS — I10 Essential (primary) hypertension: Secondary | ICD-10-CM | POA: Diagnosis not present

## 2022-07-29 DIAGNOSIS — B3731 Acute candidiasis of vulva and vagina: Secondary | ICD-10-CM | POA: Diagnosis not present

## 2022-07-29 DIAGNOSIS — G4730 Sleep apnea, unspecified: Secondary | ICD-10-CM | POA: Diagnosis not present

## 2022-07-29 DIAGNOSIS — H9209 Otalgia, unspecified ear: Secondary | ICD-10-CM | POA: Diagnosis not present

## 2022-07-29 DIAGNOSIS — Z7982 Long term (current) use of aspirin: Secondary | ICD-10-CM | POA: Diagnosis not present

## 2022-07-29 DIAGNOSIS — J3081 Allergic rhinitis due to animal (cat) (dog) hair and dander: Secondary | ICD-10-CM | POA: Diagnosis not present

## 2022-07-29 DIAGNOSIS — E039 Hypothyroidism, unspecified: Secondary | ICD-10-CM | POA: Diagnosis not present

## 2022-07-29 DIAGNOSIS — N361 Urethral diverticulum: Secondary | ICD-10-CM | POA: Diagnosis not present

## 2022-07-29 DIAGNOSIS — J301 Allergic rhinitis due to pollen: Secondary | ICD-10-CM | POA: Diagnosis not present

## 2022-07-29 DIAGNOSIS — N1330 Unspecified hydronephrosis: Secondary | ICD-10-CM | POA: Diagnosis not present

## 2022-07-29 DIAGNOSIS — K5790 Diverticulosis of intestine, part unspecified, without perforation or abscess without bleeding: Secondary | ICD-10-CM | POA: Diagnosis not present

## 2022-07-29 DIAGNOSIS — M1990 Unspecified osteoarthritis, unspecified site: Secondary | ICD-10-CM | POA: Diagnosis not present

## 2022-07-29 DIAGNOSIS — J3089 Other allergic rhinitis: Secondary | ICD-10-CM | POA: Diagnosis not present

## 2022-07-30 DIAGNOSIS — J301 Allergic rhinitis due to pollen: Secondary | ICD-10-CM | POA: Diagnosis not present

## 2022-08-07 DIAGNOSIS — J3089 Other allergic rhinitis: Secondary | ICD-10-CM | POA: Diagnosis not present

## 2022-08-07 DIAGNOSIS — J301 Allergic rhinitis due to pollen: Secondary | ICD-10-CM | POA: Diagnosis not present

## 2022-08-07 DIAGNOSIS — J3081 Allergic rhinitis due to animal (cat) (dog) hair and dander: Secondary | ICD-10-CM | POA: Diagnosis not present

## 2022-08-12 ENCOUNTER — Encounter: Payer: Self-pay | Admitting: Nurse Practitioner

## 2022-08-12 DIAGNOSIS — J301 Allergic rhinitis due to pollen: Secondary | ICD-10-CM | POA: Diagnosis not present

## 2022-08-12 DIAGNOSIS — J3089 Other allergic rhinitis: Secondary | ICD-10-CM | POA: Diagnosis not present

## 2022-08-12 DIAGNOSIS — J3081 Allergic rhinitis due to animal (cat) (dog) hair and dander: Secondary | ICD-10-CM | POA: Diagnosis not present

## 2022-08-15 DIAGNOSIS — J309 Allergic rhinitis, unspecified: Secondary | ICD-10-CM | POA: Diagnosis not present

## 2022-08-15 DIAGNOSIS — Z736 Limitation of activities due to disability: Secondary | ICD-10-CM | POA: Diagnosis not present

## 2022-08-15 DIAGNOSIS — I2510 Atherosclerotic heart disease of native coronary artery without angina pectoris: Secondary | ICD-10-CM | POA: Diagnosis not present

## 2022-08-15 DIAGNOSIS — Z48815 Encounter for surgical aftercare following surgery on the digestive system: Secondary | ICD-10-CM | POA: Diagnosis not present

## 2022-08-15 DIAGNOSIS — N361 Urethral diverticulum: Secondary | ICD-10-CM | POA: Diagnosis not present

## 2022-08-15 DIAGNOSIS — E039 Hypothyroidism, unspecified: Secondary | ICD-10-CM | POA: Diagnosis not present

## 2022-08-15 DIAGNOSIS — I252 Old myocardial infarction: Secondary | ICD-10-CM | POA: Diagnosis not present

## 2022-08-15 DIAGNOSIS — Z7982 Long term (current) use of aspirin: Secondary | ICD-10-CM | POA: Diagnosis not present

## 2022-08-15 DIAGNOSIS — M5430 Sciatica, unspecified side: Secondary | ICD-10-CM | POA: Diagnosis not present

## 2022-08-15 DIAGNOSIS — G4730 Sleep apnea, unspecified: Secondary | ICD-10-CM | POA: Diagnosis not present

## 2022-08-15 DIAGNOSIS — K5790 Diverticulosis of intestine, part unspecified, without perforation or abscess without bleeding: Secondary | ICD-10-CM | POA: Diagnosis not present

## 2022-08-15 DIAGNOSIS — B3731 Acute candidiasis of vulva and vagina: Secondary | ICD-10-CM | POA: Diagnosis not present

## 2022-08-15 DIAGNOSIS — M1990 Unspecified osteoarthritis, unspecified site: Secondary | ICD-10-CM | POA: Diagnosis not present

## 2022-08-15 DIAGNOSIS — N1330 Unspecified hydronephrosis: Secondary | ICD-10-CM | POA: Diagnosis not present

## 2022-08-15 DIAGNOSIS — H9209 Otalgia, unspecified ear: Secondary | ICD-10-CM | POA: Diagnosis not present

## 2022-08-15 DIAGNOSIS — K219 Gastro-esophageal reflux disease without esophagitis: Secondary | ICD-10-CM | POA: Diagnosis not present

## 2022-08-15 DIAGNOSIS — I10 Essential (primary) hypertension: Secondary | ICD-10-CM | POA: Diagnosis not present

## 2022-08-21 DIAGNOSIS — J3089 Other allergic rhinitis: Secondary | ICD-10-CM | POA: Diagnosis not present

## 2022-08-21 DIAGNOSIS — J301 Allergic rhinitis due to pollen: Secondary | ICD-10-CM | POA: Diagnosis not present

## 2022-08-21 DIAGNOSIS — J3081 Allergic rhinitis due to animal (cat) (dog) hair and dander: Secondary | ICD-10-CM | POA: Diagnosis not present

## 2022-08-26 DIAGNOSIS — J309 Allergic rhinitis, unspecified: Secondary | ICD-10-CM | POA: Diagnosis not present

## 2022-08-26 DIAGNOSIS — Z7982 Long term (current) use of aspirin: Secondary | ICD-10-CM | POA: Diagnosis not present

## 2022-08-26 DIAGNOSIS — Z736 Limitation of activities due to disability: Secondary | ICD-10-CM | POA: Diagnosis not present

## 2022-08-26 DIAGNOSIS — J301 Allergic rhinitis due to pollen: Secondary | ICD-10-CM | POA: Diagnosis not present

## 2022-08-26 DIAGNOSIS — G4730 Sleep apnea, unspecified: Secondary | ICD-10-CM | POA: Diagnosis not present

## 2022-08-26 DIAGNOSIS — Z48815 Encounter for surgical aftercare following surgery on the digestive system: Secondary | ICD-10-CM | POA: Diagnosis not present

## 2022-08-26 DIAGNOSIS — M5430 Sciatica, unspecified side: Secondary | ICD-10-CM | POA: Diagnosis not present

## 2022-08-26 DIAGNOSIS — M1990 Unspecified osteoarthritis, unspecified site: Secondary | ICD-10-CM | POA: Diagnosis not present

## 2022-08-26 DIAGNOSIS — J3081 Allergic rhinitis due to animal (cat) (dog) hair and dander: Secondary | ICD-10-CM | POA: Diagnosis not present

## 2022-08-26 DIAGNOSIS — E039 Hypothyroidism, unspecified: Secondary | ICD-10-CM | POA: Diagnosis not present

## 2022-08-26 DIAGNOSIS — K219 Gastro-esophageal reflux disease without esophagitis: Secondary | ICD-10-CM | POA: Diagnosis not present

## 2022-08-26 DIAGNOSIS — K5790 Diverticulosis of intestine, part unspecified, without perforation or abscess without bleeding: Secondary | ICD-10-CM | POA: Diagnosis not present

## 2022-08-26 DIAGNOSIS — I10 Essential (primary) hypertension: Secondary | ICD-10-CM | POA: Diagnosis not present

## 2022-08-26 DIAGNOSIS — N1330 Unspecified hydronephrosis: Secondary | ICD-10-CM | POA: Diagnosis not present

## 2022-08-26 DIAGNOSIS — I252 Old myocardial infarction: Secondary | ICD-10-CM | POA: Diagnosis not present

## 2022-08-26 DIAGNOSIS — B3731 Acute candidiasis of vulva and vagina: Secondary | ICD-10-CM | POA: Diagnosis not present

## 2022-08-26 DIAGNOSIS — Z961 Presence of intraocular lens: Secondary | ICD-10-CM | POA: Diagnosis not present

## 2022-08-26 DIAGNOSIS — N361 Urethral diverticulum: Secondary | ICD-10-CM | POA: Diagnosis not present

## 2022-08-26 DIAGNOSIS — J3089 Other allergic rhinitis: Secondary | ICD-10-CM | POA: Diagnosis not present

## 2022-08-26 DIAGNOSIS — H9209 Otalgia, unspecified ear: Secondary | ICD-10-CM | POA: Diagnosis not present

## 2022-08-26 DIAGNOSIS — I2510 Atherosclerotic heart disease of native coronary artery without angina pectoris: Secondary | ICD-10-CM | POA: Diagnosis not present

## 2022-09-04 DIAGNOSIS — J301 Allergic rhinitis due to pollen: Secondary | ICD-10-CM | POA: Diagnosis not present

## 2022-09-04 DIAGNOSIS — J3081 Allergic rhinitis due to animal (cat) (dog) hair and dander: Secondary | ICD-10-CM | POA: Diagnosis not present

## 2022-09-04 DIAGNOSIS — J3089 Other allergic rhinitis: Secondary | ICD-10-CM | POA: Diagnosis not present

## 2022-09-08 DIAGNOSIS — K219 Gastro-esophageal reflux disease without esophagitis: Secondary | ICD-10-CM | POA: Diagnosis not present

## 2022-09-08 DIAGNOSIS — Z736 Limitation of activities due to disability: Secondary | ICD-10-CM | POA: Diagnosis not present

## 2022-09-08 DIAGNOSIS — H9209 Otalgia, unspecified ear: Secondary | ICD-10-CM | POA: Diagnosis not present

## 2022-09-08 DIAGNOSIS — I252 Old myocardial infarction: Secondary | ICD-10-CM | POA: Diagnosis not present

## 2022-09-08 DIAGNOSIS — J309 Allergic rhinitis, unspecified: Secondary | ICD-10-CM | POA: Diagnosis not present

## 2022-09-08 DIAGNOSIS — Z7982 Long term (current) use of aspirin: Secondary | ICD-10-CM | POA: Diagnosis not present

## 2022-09-08 DIAGNOSIS — Z48815 Encounter for surgical aftercare following surgery on the digestive system: Secondary | ICD-10-CM | POA: Diagnosis not present

## 2022-09-08 DIAGNOSIS — E039 Hypothyroidism, unspecified: Secondary | ICD-10-CM | POA: Diagnosis not present

## 2022-09-08 DIAGNOSIS — I10 Essential (primary) hypertension: Secondary | ICD-10-CM | POA: Diagnosis not present

## 2022-09-08 DIAGNOSIS — M1990 Unspecified osteoarthritis, unspecified site: Secondary | ICD-10-CM | POA: Diagnosis not present

## 2022-09-08 DIAGNOSIS — G4730 Sleep apnea, unspecified: Secondary | ICD-10-CM | POA: Diagnosis not present

## 2022-09-08 DIAGNOSIS — M5430 Sciatica, unspecified side: Secondary | ICD-10-CM | POA: Diagnosis not present

## 2022-09-08 DIAGNOSIS — N361 Urethral diverticulum: Secondary | ICD-10-CM | POA: Diagnosis not present

## 2022-09-08 DIAGNOSIS — N1330 Unspecified hydronephrosis: Secondary | ICD-10-CM | POA: Diagnosis not present

## 2022-09-08 DIAGNOSIS — B3731 Acute candidiasis of vulva and vagina: Secondary | ICD-10-CM | POA: Diagnosis not present

## 2022-09-08 DIAGNOSIS — K5790 Diverticulosis of intestine, part unspecified, without perforation or abscess without bleeding: Secondary | ICD-10-CM | POA: Diagnosis not present

## 2022-09-08 DIAGNOSIS — I2510 Atherosclerotic heart disease of native coronary artery without angina pectoris: Secondary | ICD-10-CM | POA: Diagnosis not present

## 2022-09-09 ENCOUNTER — Telehealth: Payer: Self-pay | Admitting: Nurse Practitioner

## 2022-09-09 NOTE — Telephone Encounter (Signed)
Spoke with pt as per alyssa last note she d/c [facility]

## 2022-09-12 DIAGNOSIS — J3081 Allergic rhinitis due to animal (cat) (dog) hair and dander: Secondary | ICD-10-CM | POA: Diagnosis not present

## 2022-09-12 DIAGNOSIS — J3089 Other allergic rhinitis: Secondary | ICD-10-CM | POA: Diagnosis not present

## 2022-09-12 DIAGNOSIS — J301 Allergic rhinitis due to pollen: Secondary | ICD-10-CM | POA: Diagnosis not present

## 2022-09-17 IMAGING — DX DG CHEST 1V PORT
1 series · 1 of 1 positions shown · non-contrast
Comparison: Chest radiograph dated 06/26/2015

CLINICAL DATA: Chest pain.

EXAM:
PORTABLE CHEST 1 VIEW

[chest ap]
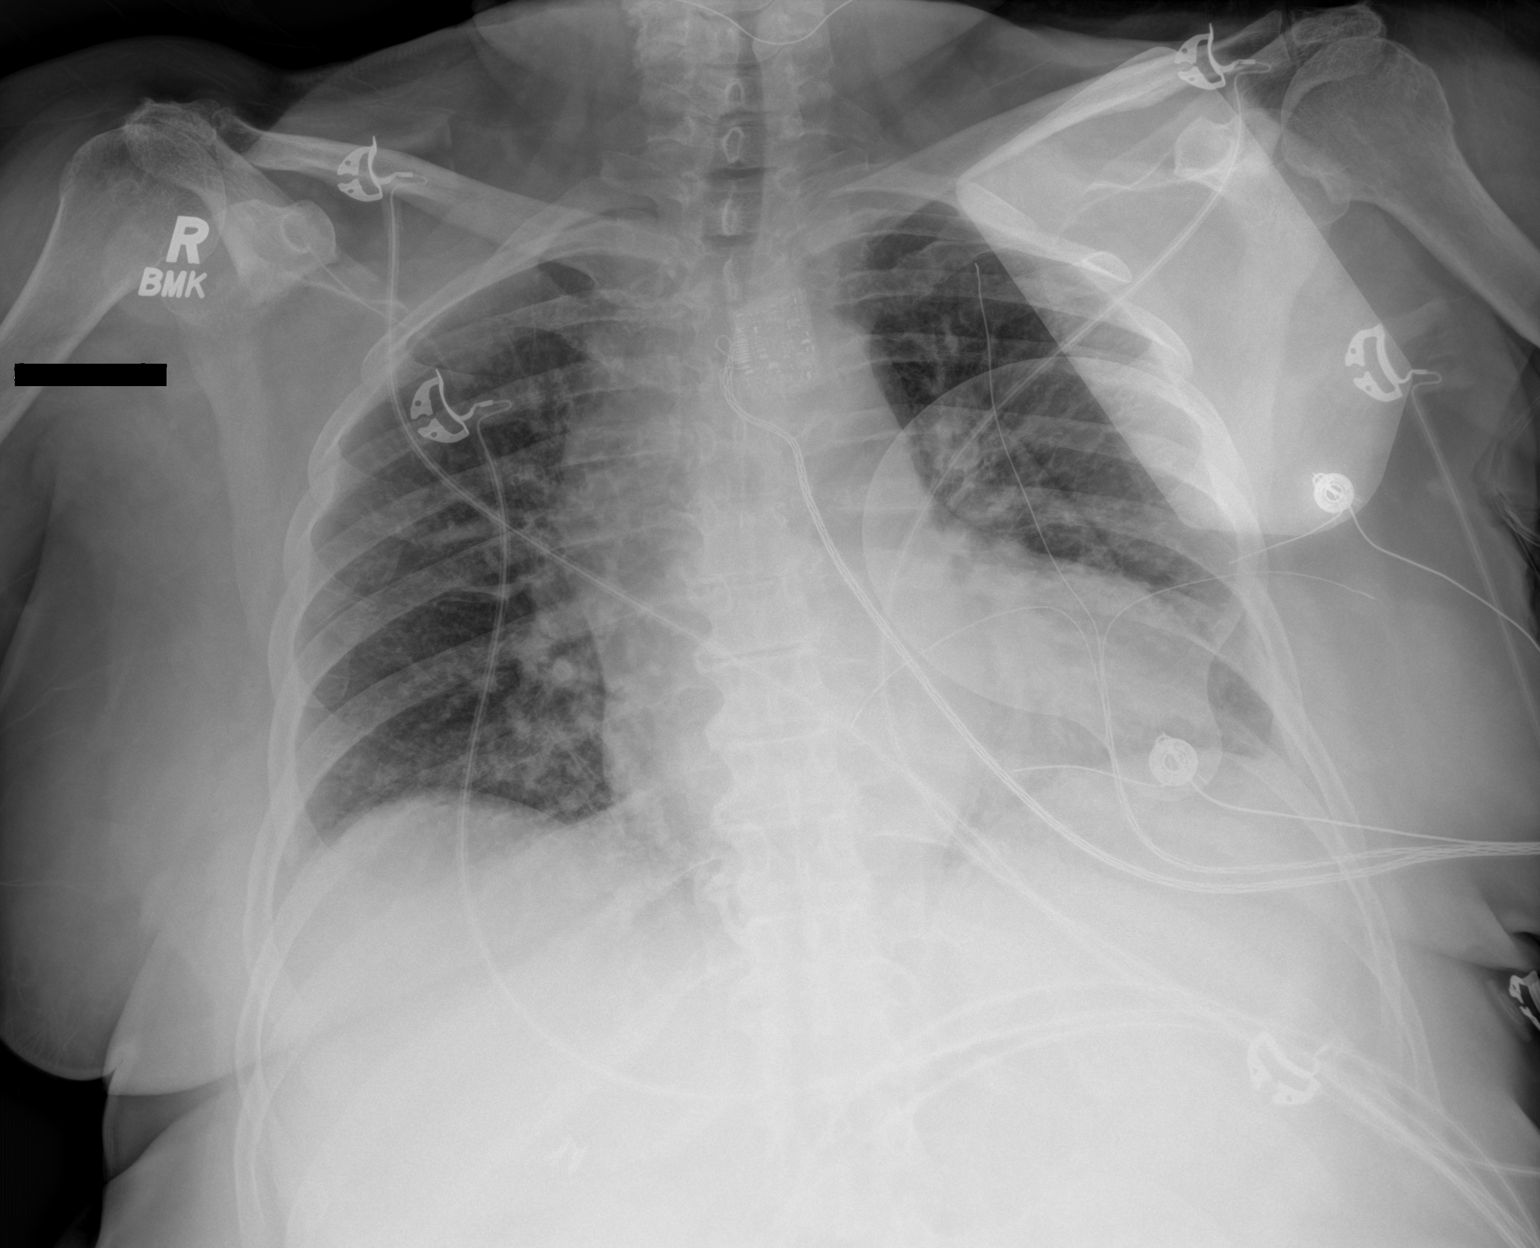

[1 of 1 positions shown; findings below may reference images not displayed]

FINDINGS: Cardiomegaly with vascular congestion. No focal consolidation,
pleural effusion, or pneumothorax. Degenerative changes of the
spine. No acute osseous pathology.
IMPRESSION: Cardiomegaly with vascular congestion. No focal consolidation.

## 2022-09-18 DIAGNOSIS — J3089 Other allergic rhinitis: Secondary | ICD-10-CM | POA: Diagnosis not present

## 2022-09-18 DIAGNOSIS — J301 Allergic rhinitis due to pollen: Secondary | ICD-10-CM | POA: Diagnosis not present

## 2022-09-18 DIAGNOSIS — J3081 Allergic rhinitis due to animal (cat) (dog) hair and dander: Secondary | ICD-10-CM | POA: Diagnosis not present

## 2022-09-22 DIAGNOSIS — I2510 Atherosclerotic heart disease of native coronary artery without angina pectoris: Secondary | ICD-10-CM | POA: Diagnosis not present

## 2022-09-22 DIAGNOSIS — Z48815 Encounter for surgical aftercare following surgery on the digestive system: Secondary | ICD-10-CM | POA: Diagnosis not present

## 2022-09-22 DIAGNOSIS — I10 Essential (primary) hypertension: Secondary | ICD-10-CM | POA: Diagnosis not present

## 2022-09-22 DIAGNOSIS — B3731 Acute candidiasis of vulva and vagina: Secondary | ICD-10-CM | POA: Diagnosis not present

## 2022-09-22 DIAGNOSIS — M5430 Sciatica, unspecified side: Secondary | ICD-10-CM | POA: Diagnosis not present

## 2022-09-22 DIAGNOSIS — K5790 Diverticulosis of intestine, part unspecified, without perforation or abscess without bleeding: Secondary | ICD-10-CM | POA: Diagnosis not present

## 2022-09-22 DIAGNOSIS — H9209 Otalgia, unspecified ear: Secondary | ICD-10-CM | POA: Diagnosis not present

## 2022-09-22 DIAGNOSIS — K219 Gastro-esophageal reflux disease without esophagitis: Secondary | ICD-10-CM | POA: Diagnosis not present

## 2022-09-22 DIAGNOSIS — Z7982 Long term (current) use of aspirin: Secondary | ICD-10-CM | POA: Diagnosis not present

## 2022-09-22 DIAGNOSIS — M1990 Unspecified osteoarthritis, unspecified site: Secondary | ICD-10-CM | POA: Diagnosis not present

## 2022-09-22 DIAGNOSIS — I252 Old myocardial infarction: Secondary | ICD-10-CM | POA: Diagnosis not present

## 2022-09-22 DIAGNOSIS — E039 Hypothyroidism, unspecified: Secondary | ICD-10-CM | POA: Diagnosis not present

## 2022-09-22 DIAGNOSIS — N361 Urethral diverticulum: Secondary | ICD-10-CM | POA: Diagnosis not present

## 2022-09-22 DIAGNOSIS — G4730 Sleep apnea, unspecified: Secondary | ICD-10-CM | POA: Diagnosis not present

## 2022-09-22 DIAGNOSIS — J309 Allergic rhinitis, unspecified: Secondary | ICD-10-CM | POA: Diagnosis not present

## 2022-09-22 DIAGNOSIS — N1330 Unspecified hydronephrosis: Secondary | ICD-10-CM | POA: Diagnosis not present

## 2022-09-22 DIAGNOSIS — Z736 Limitation of activities due to disability: Secondary | ICD-10-CM | POA: Diagnosis not present

## 2022-09-24 ENCOUNTER — Encounter: Payer: Self-pay | Admitting: Surgery

## 2022-09-24 ENCOUNTER — Other Ambulatory Visit: Payer: Self-pay

## 2022-09-24 ENCOUNTER — Ambulatory Visit (INDEPENDENT_AMBULATORY_CARE_PROVIDER_SITE_OTHER): Payer: Medicare Other | Admitting: Surgery

## 2022-09-24 VITALS — BP 172/90 | HR 67 | Temp 98.1°F | Ht <= 58 in | Wt 163.0 lb

## 2022-09-24 DIAGNOSIS — K430 Incisional hernia with obstruction, without gangrene: Secondary | ICD-10-CM

## 2022-09-24 DIAGNOSIS — K432 Incisional hernia without obstruction or gangrene: Secondary | ICD-10-CM

## 2022-09-24 DIAGNOSIS — Z09 Encounter for follow-up examination after completed treatment for conditions other than malignant neoplasm: Secondary | ICD-10-CM

## 2022-09-24 MED ORDER — CLINDAMYCIN HCL 300 MG PO CAPS: 300.0000 mg | ORAL_CAPSULE | Freq: Three times a day (TID) | ORAL | 0 refills | Status: DC

## 2022-09-24 NOTE — Patient Instructions (Signed)
Pick up medication at the pharmacy. Please see your appointment listed below.

## 2022-09-25 ENCOUNTER — Encounter: Payer: Self-pay | Admitting: Surgery

## 2022-09-25 DIAGNOSIS — J301 Allergic rhinitis due to pollen: Secondary | ICD-10-CM | POA: Diagnosis not present

## 2022-09-25 DIAGNOSIS — J3081 Allergic rhinitis due to animal (cat) (dog) hair and dander: Secondary | ICD-10-CM | POA: Diagnosis not present

## 2022-09-25 DIAGNOSIS — J3089 Other allergic rhinitis: Secondary | ICD-10-CM | POA: Diagnosis not present

## 2022-09-25 NOTE — Progress Notes (Signed)
Outpatient Surgical Follow Up  09/25/2022  Claudia Martin is an 75 y.o. female.   Chief Complaint  Patient presents with   Routine Post Op    Abdominal wall reconstruction 05/2022    HPI: Claudia Martin is a 4 month out from abdominal wall reconstruction for a complex abdominal wall hernia. She has done well.  She did have a very small superficial dehiscence on the inferior portion of the wound that's has healed well. She now has a new are on the superior aspect. She thinks is a blisters.  No fevers no chills she is doing very wel   Past Medical History:  Diagnosis Date   Allergic rhinitis    Aortic atherosclerosis ([facility])    Arthritis    CAD (coronary artery disease) 06/06/2021   a.) STEMI --> LHC: EF 55-65%, 100% RI --> PCI performed placing a 2.5 x 18 mm Onxy Frontier DES   Cellulitis and abscess    Diastolic dysfunction 76/28/3151   a.) TTE 06/07/2021: EF 55-60%, no RWMAs, triv MR, mild AoV sclerosis with no stenosis, G1DD.   Diverticulosis    Enlarged heart    Eyelid inflammation    GERD (gastroesophageal reflux disease)    Hematuria syndrome    Hemorrhoids    Herpes genitalis    Herpes simplex    HTN (hypertension)    Hydronephrosis    Hypothyroidism    Incomplete bladder emptying    Internal hordeolum    Long term current use of antithrombotics/antiplatelets    a.) on daily DAPT therapy (ASA + ticagrelor)   OSA on CPAP    Primary ovarian failure    Sciatica    Shortness of breath    Sinus bradycardia    STEMI (ST elevation myocardial infarction) ([place]) 06/06/2021   a.) LHC/PCI --> 100% RI (2.5 x 18 m Onyx Frontier DES)   Urethral diverticulum    Vaginal atrophy     Past Surgical History:  Procedure Laterality Date   ABDOMINAL WALL DEFECT REPAIR N/A 05/27/2022   Procedure: REPAIR ABDOMINAL WALL, reconstruction;  Surgeon: Jules Husbands, MD;  Location: [facility];  Service: General;  Laterality: N/A;   BREAST BIOPSY     CATARACT EXTRACTION W/PHACO Right  06/05/2021   Procedure: CATARACT EXTRACTION PHACO AND INTRAOCULAR LENS PLACEMENT ([place]) RIGHT;  Surgeon: Leandrew Koyanagi, MD;  Location: [place];  Service: Ophthalmology;  Laterality: Right;  3.02 00:39.5   CATARACT EXTRACTION W/PHACO Left 12/04/2021   Procedure: CATARACT EXTRACTION PHACO AND INTRAOCULAR LENS PLACEMENT ([place]) LEFT;  Surgeon: Leandrew Koyanagi, MD;  Location: [place];  Service: Ophthalmology;  Laterality: Left;  6.23 00:48.0   COLON SURGERY     COLONOSCOPY WITH PROPOFOL N/A 03/15/2018   Procedure: COLONOSCOPY WITH PROPOFOL;  Surgeon: Lin Landsman, MD;  Location: [facility] ENDOSCOPY;  Service: Gastroenterology;  Laterality: N/A;   COLONOSCOPY WITH PROPOFOL N/A 01/09/2021   Procedure: COLONOSCOPY WITH PROPOFOL;  Surgeon: Lin Landsman, MD;  Location: [facility] ENDOSCOPY;  Service: Gastroenterology;  Laterality: N/A;   CORONARY/GRAFT ACUTE MI REVASCULARIZATION N/A 06/06/2021   Procedure: Coronary/Graft Acute MI Revascularization;  Surgeon: Yolonda Kida, MD;  Location: [place] CV LAB;  Service: Cardiovascular;  Laterality: N/A;   ECTOPIC PREGNANCY SURGERY     fx thumb     HERNIA REPAIR     INSERTION OF MESH N/A 05/27/2022   Procedure: INSERTION OF MESH;  Surgeon: Jules Husbands, MD;  Location: [facility];  Service: General;  Laterality: N/A;   LEFT  HEART CATH AND CORONARY ANGIOGRAPHY N/A 06/06/2021   Procedure: LEFT HEART CATH AND CORONARY ANGIOGRAPHY;  Surgeon: Yolonda Kida, MD;  Location: [place] CV LAB;  Service: Cardiovascular;  Laterality: N/A;   VENTRAL HERNIA REPAIR N/A 05/27/2022   Procedure: HERNIA REPAIR VENTRAL ADULT, recurrent;  Surgeon: Jules Husbands, MD;  Location: [facility] OR[facility];  Service: General;  Laterality: N/A;    Family History  Problem Relation Age of Onset   Tuberculosis Mother    Nephrolithiasis Brother    Vision loss Daughter    Hypertension Daughter     Social History:  reports that she has never  smoked. She has never been exposed to tobacco smoke. She has never used smokeless tobacco. She reports that she does not drink alcohol and does not use drugs.  Allergies:  Allergies  Allergen Reactions   Other Diarrhea and Hives   Augmentin [Amoxicillin-Pot Clavulanate]    Bactrim [Sulfamethoxazole-Trimethoprim] Hives   Doxycycline    Ibuprofen     Other reaction(s): Unknown   Iodinated Contrast Media Diarrhea and Other (See Comments)   Macrobid [Nitrofurantoin]    Prednisone Nausea Only    Other reaction(s): Dizziness   Sulfa Antibiotics Other (See Comments)    Medications reviewed.    ROS Full ROS performed and is otherwise negative other than what is stated in HPI   BP (!) 172/90   Pulse 67   Temp 98.1 F (36.7 C) (Oral)   Ht '4\' 9"'$  (1.448 m)   Wt 163 lb (73.9 kg)   SpO2 99%   BMI 35.27 kg/m   Physical Exam Vitals and nursing note reviewed. Exam conducted with a chaperone present.  Constitutional:      Appearance: Normal appearance.  Abdominal:     General: Abdomen is flat. There is no distension.     Palpations: Abdomen is soft. There is no mass.     Tenderness: There is no abdominal tenderness. There is no guarding or rebound.     Hernia: No hernia is present.     Comments: Pinpoint area in the lower portion with scant amount of serous fluid.  There is also another area on the upper portion seems to be blistering.  Some fluid. No definitive infection.  No recurrence  Musculoskeletal:        General: Normal range of motion.     Cervical back: Normal range of motion and neck supple. No rigidity.  Skin:    General: Skin is warm.     Capillary Refill: Capillary refill takes less than 2 seconds.  Neurological:     General: No focal deficit present.     Mental Status: She is alert and oriented to person, place, and time.  Psychiatric:        Mood and Affect: Mood normal.        Behavior: Behavior normal.        Thought Content: Thought content normal.         Judgment: Judgment normal.    Assessment/Plan: Small blister on the upper portion.  Out of precaution we will do a short course of antibiotic therapy with clindamycin to avoid any potential infection of the mesh.  I will see her back in about 3 weeks.  No need for surgical intervention or hospitalization. I Spent 20 minutes in this encounter including  counseling the patient, placing orders and performing appropriate documentation.    Caroleen Hamman, MD [facility] General Surgeon

## 2022-10-02 DIAGNOSIS — J3089 Other allergic rhinitis: Secondary | ICD-10-CM | POA: Diagnosis not present

## 2022-10-02 DIAGNOSIS — J301 Allergic rhinitis due to pollen: Secondary | ICD-10-CM | POA: Diagnosis not present

## 2022-10-02 DIAGNOSIS — J3081 Allergic rhinitis due to animal (cat) (dog) hair and dander: Secondary | ICD-10-CM | POA: Diagnosis not present

## 2022-10-09 DIAGNOSIS — J3089 Other allergic rhinitis: Secondary | ICD-10-CM | POA: Diagnosis not present

## 2022-10-09 DIAGNOSIS — J301 Allergic rhinitis due to pollen: Secondary | ICD-10-CM | POA: Diagnosis not present

## 2022-10-09 DIAGNOSIS — J3081 Allergic rhinitis due to animal (cat) (dog) hair and dander: Secondary | ICD-10-CM | POA: Diagnosis not present

## 2022-10-13 ENCOUNTER — Ambulatory Visit (INDEPENDENT_AMBULATORY_CARE_PROVIDER_SITE_OTHER): Payer: Medicare Other | Admitting: Surgery

## 2022-10-13 ENCOUNTER — Other Ambulatory Visit: Payer: Self-pay

## 2022-10-13 ENCOUNTER — Encounter: Payer: Self-pay | Admitting: Surgery

## 2022-10-13 VITALS — BP 175/86 | HR 61 | Temp 98.0°F | Ht <= 58 in | Wt 164.0 lb

## 2022-10-13 DIAGNOSIS — T8130XA Disruption of wound, unspecified, initial encounter: Secondary | ICD-10-CM | POA: Diagnosis not present

## 2022-10-13 NOTE — Patient Instructions (Signed)
Please call with any questions or concerns.

## 2022-10-16 DIAGNOSIS — J3089 Other allergic rhinitis: Secondary | ICD-10-CM | POA: Diagnosis not present

## 2022-10-16 DIAGNOSIS — G4733 Obstructive sleep apnea (adult) (pediatric): Secondary | ICD-10-CM | POA: Diagnosis not present

## 2022-10-16 DIAGNOSIS — J3081 Allergic rhinitis due to animal (cat) (dog) hair and dander: Secondary | ICD-10-CM | POA: Diagnosis not present

## 2022-10-16 DIAGNOSIS — J301 Allergic rhinitis due to pollen: Secondary | ICD-10-CM | POA: Diagnosis not present

## 2022-10-17 NOTE — Progress Notes (Signed)
Outpatient Surgical Follow Up  10/17/2022  Claudia Martin is an 77 y.o. female.   Chief Complaint  Patient presents with   Routine Post Op    Abd wall reconstrution    HPI: Claudia Martin is a 5 month out from abdominal wall reconstruction for a complex abdominal wall hernia. She has done well.  She did have a very small superficial dehiscence on the inferior portion of the wound that's has healed well. She completed antibiotics rx w good results. There some ocassional serous drainage. Prior area has healed.  Past Medical History:  Diagnosis Date   Allergic rhinitis    Aortic atherosclerosis (HCC)    Arthritis    CAD (coronary artery disease) 06/06/2021   a.) STEMI --> LHC: EF 55-65%, 100% RI --> PCI performed placing a 2.5 x 18 mm Onxy Frontier DES   Cellulitis and abscess    Diastolic dysfunction 16/07/9603   a.) TTE 06/07/2021: EF 55-60%, no RWMAs, triv MR, mild AoV sclerosis with no stenosis, G1DD.   Diverticulosis    Enlarged heart    Eyelid inflammation    GERD (gastroesophageal reflux disease)    Hematuria syndrome    Hemorrhoids    Herpes genitalis    Herpes simplex    HTN (hypertension)    Hydronephrosis    Hypothyroidism    Incomplete bladder emptying    Internal hordeolum    Long term current use of antithrombotics/antiplatelets    a.) on daily DAPT therapy (ASA + ticagrelor)   OSA on CPAP    Primary ovarian failure    Sciatica    Shortness of breath    Sinus bradycardia    STEMI (ST elevation myocardial infarction) ([place]) 06/06/2021   a.) LHC/PCI --> 100% RI (2.5 x 18 m Onyx Frontier DES)   Urethral diverticulum    Vaginal atrophy     Past Surgical History:  Procedure Laterality Date   ABDOMINAL WALL DEFECT REPAIR N/A 05/27/2022   Procedure: REPAIR ABDOMINAL WALL, reconstruction;  Surgeon: Jules Husbands, MD;  Location: [facility];  Service: General;  Laterality: N/A;   BREAST BIOPSY     CATARACT EXTRACTION W/PHACO Right 06/05/2021   Procedure: CATARACT  EXTRACTION PHACO AND INTRAOCULAR LENS PLACEMENT ([place]) RIGHT;  Surgeon: Leandrew Koyanagi, MD;  Location: [place];  Service: Ophthalmology;  Laterality: Right;  3.02 00:39.5   CATARACT EXTRACTION W/PHACO Left 12/04/2021   Procedure: CATARACT EXTRACTION PHACO AND INTRAOCULAR LENS PLACEMENT ([place]) LEFT;  Surgeon: Leandrew Koyanagi, MD;  Location: [place];  Service: Ophthalmology;  Laterality: Left;  6.23 00:48.0   COLON SURGERY     COLONOSCOPY WITH PROPOFOL N/A 03/15/2018   Procedure: COLONOSCOPY WITH PROPOFOL;  Surgeon: Lin Landsman, MD;  Location: [facility] ENDOSCOPY;  Service: Gastroenterology;  Laterality: N/A;   COLONOSCOPY WITH PROPOFOL N/A 01/09/2021   Procedure: COLONOSCOPY WITH PROPOFOL;  Surgeon: Lin Landsman, MD;  Location: [facility] ENDOSCOPY;  Service: Gastroenterology;  Laterality: N/A;   CORONARY/GRAFT ACUTE MI REVASCULARIZATION N/A 06/06/2021   Procedure: Coronary/Graft Acute MI Revascularization;  Surgeon: Yolonda Kida, MD;  Location: [place] [facility];  Service: Cardiovascular;  Laterality: N/A;   ECTOPIC PREGNANCY SURGERY     fx thumb     HERNIA REPAIR     INSERTION OF MESH N/A 05/27/2022   Procedure: INSERTION OF MESH;  Surgeon: Jules Husbands, MD;  Location: [facility];  Service: General;  Laterality: N/A;   LEFT HEART CATH AND CORONARY ANGIOGRAPHY N/A 06/06/2021   Procedure: LEFT  HEART CATH AND CORONARY ANGIOGRAPHY;  Surgeon: Yolonda Kida, MD;  Location: [place] [facility];  Service: Cardiovascular;  Laterality: N/A;   VENTRAL HERNIA REPAIR N/A 05/27/2022   Procedure: HERNIA REPAIR VENTRAL ADULT, recurrent;  Surgeon: Jules Husbands, MD;  Location: [facility];  Service: General;  Laterality: N/A;    Family History  Problem Relation Age of Onset   Tuberculosis Mother    Nephrolithiasis Brother    Vision loss Daughter    Hypertension Daughter     Social History:  reports that she has never smoked. She has never been exposed to  tobacco smoke. She has never used smokeless tobacco. She reports that she does not drink alcohol and does not use drugs.  Allergies:  Allergies  Allergen Reactions   Other Diarrhea and Hives   Augmentin [Amoxicillin-Pot Clavulanate]    Bactrim [Sulfamethoxazole-Trimethoprim] Hives   Doxycycline    Ibuprofen     Other reaction(s): Unknown   Iodinated Contrast Media Diarrhea and Other (See Comments)   Macrobid [Nitrofurantoin]    Prednisone Nausea Only    Other reaction(s): Dizziness   Sulfa Antibiotics Other (See Comments)    Medications reviewed.    ROS Full ROS performed and is otherwise negative other than what is stated in HPI   BP (!) 175/86   Pulse 61   Temp 98 F (36.7 C) (Oral)   Ht '4\' 9"'$  (1.448 m)   Wt 164 lb (74.4 kg)   SpO2 100%   BMI 35.49 kg/m   Physical Exam  Outpatient Surgical Follow Up   09/25/2022   Claudia Martin is an 77 y.o. female.        Chief Complaint  Patient presents with   Routine Post Op      Abdominal wall reconstruction 05/2022      HPI: Claudia Martin is a 4 month out from abdominal wall reconstruction for a complex abdominal wall hernia. She has done well.  She did have a very small superficial dehiscence on the inferior portion of the wound that's has healed well. She now has a new are on the superior aspect. She thinks is a blisters.  No fevers no chills she is doing very wel        Past Medical History:  Diagnosis Date   Allergic rhinitis     Aortic atherosclerosis (HCC)     Arthritis     CAD (coronary artery disease) 06/06/2021    a.) STEMI --> LHC: EF 55-65%, 100% RI --> PCI performed placing a 2.5 x 18 mm Onxy Frontier DES   Cellulitis and abscess     Diastolic dysfunction 64/33/2951    a.) TTE 06/07/2021: EF 55-60%, no RWMAs, triv MR, mild AoV sclerosis with no stenosis, G1DD.   Diverticulosis     Enlarged heart     Eyelid inflammation     GERD (gastroesophageal reflux disease)     Hematuria syndrome      Hemorrhoids     Herpes genitalis     Herpes simplex     HTN (hypertension)     Hydronephrosis     Hypothyroidism     Incomplete bladder emptying     Internal hordeolum     Long term current use of antithrombotics/antiplatelets      a.) on daily DAPT therapy (ASA + ticagrelor)   OSA on CPAP     Primary ovarian failure     Sciatica     Shortness of breath     Sinus  bradycardia     STEMI (ST elevation myocardial infarction) ([place]) 06/06/2021    a.) LHC/PCI --> 100% RI (2.5 x 18 m Onyx Frontier DES)   Urethral diverticulum     Vaginal atrophy             Past Surgical History:  Procedure Laterality Date   ABDOMINAL WALL DEFECT REPAIR N/A 05/27/2022    Procedure: REPAIR ABDOMINAL WALL, reconstruction;  Surgeon: Jules Husbands, MD;  Location: [facility];  Service: General;  Laterality: N/A;   BREAST BIOPSY       CATARACT EXTRACTION W/PHACO Right 06/05/2021    Procedure: CATARACT EXTRACTION PHACO AND INTRAOCULAR LENS PLACEMENT ([place]) RIGHT;  Surgeon: Leandrew Koyanagi, MD;  Location: [place];  Service: Ophthalmology;  Laterality: Right;  3.02 00:39.5   CATARACT EXTRACTION W/PHACO Left 12/04/2021    Procedure: CATARACT EXTRACTION PHACO AND INTRAOCULAR LENS PLACEMENT ([place]) LEFT;  Surgeon: Leandrew Koyanagi, MD;  Location: [place];  Service: Ophthalmology;  Laterality: Left;  6.23 00:48.0   COLON SURGERY       COLONOSCOPY WITH PROPOFOL N/A 03/15/2018    Procedure: COLONOSCOPY WITH PROPOFOL;  Surgeon: Lin Landsman, MD;  Location: [facility];  Service: Gastroenterology;  Laterality: N/A;   COLONOSCOPY WITH PROPOFOL N/A 01/09/2021    Procedure: COLONOSCOPY WITH PROPOFOL;  Surgeon: Lin Landsman, MD;  Location[facility]COPY;  Service: Gastroenterology;  Laterality: N/A;   CORONARY/GRAFT ACUTE MI REVASCULARIZATION N/A 06/06/2021    Procedure: Coronary/Graft Acute MI Revascularization;  Surgeon: Yolonda Kida, MD;  Location: [place] [facility]  [facility];  Service: Cardiovascular;  Laterality: N/A;   ECTOPIC PREGNANCY SURGERY       fx thumb       HERNIA REPAIR       INSERTION OF MESH N/A 05/27/2022    Procedure: INSERTION OF MESH;  Surgeon: Jules Husbands, MD;  Location: [facility];  Service: General;  Laterality: N/A;   LEFT HEART CATH AND CORONARY ANGIOGRAPHY N/A 06/06/2021    Procedure: LEFT HEART CATH AND CORONARY ANGIOGRAPHY;  Surgeon: Yolonda Kida, MD;  Location: [place] [facility];  Service: Cardiovascular;  Laterality: N/A;   VENTRAL HERNIA REPAIR N/A 05/27/2022    Procedure: HERNIA REPAIR VENTRAL ADULT, recurrent;  Surgeon: Jules Husbands, MD;  Location: [facility];  Service: General;  Laterality: N/A;           Family History  Problem Relation Age of Onset   Tuberculosis Mother     Nephrolithiasis Brother     Vision loss Daughter     Hypertension Daughter        Social History:  reports that she has never smoked. She has never been exposed to tobacco smoke. She has never used smokeless tobacco. She reports that she does not drink alcohol and does not use drugs.   Allergies:       Allergies  Allergen Reactions   Other Diarrhea and Hives   Augmentin [Amoxicillin-Pot Clavulanate]     Bactrim [Sulfamethoxazole-Trimethoprim] Hives   Doxycycline     Ibuprofen        Other reaction(s): Unknown   Iodinated Contrast Media Diarrhea and Other (See Comments)   Macrobid [Nitrofurantoin]     Prednisone Nausea Only      Other reaction(s): Dizziness   Sulfa Antibiotics Other (See Comments)      Medications reviewed.       ROS Full ROS performed and is otherwise negative other than what is stated in HPI  BP (!) 172/90   Pulse 67   Temp 98.1 F (36.7 C) (Oral)   Ht '4\' 9"'$  (1.448 m)   Wt 163 lb (73.9 kg)   SpO2 99%   BMI 35.27 kg/m    Physical Exam Vitals and nursing note reviewed. Exam conducted with a chaperone present.  Constitutional:      Appearance: Normal appearance.  Abdominal:     General:  Abdomen is flat. There is no distension.     Palpations: Abdomen is soft. There is no mass.     Tenderness: There is no abdominal tenderness. There is no guarding or rebound.     Hernia: No hernia is present.     Comments: one drop of serous drainage from middle of wound..  prior upper wound completely healed. No infection.   No recurrence  Musculoskeletal:        General: Normal range of motion.     Cervical back: Normal range of motion and neck supple. No rigidity.  Skin:    General: Skin is warm.     Capillary Refill: Capillary refill takes less than 2 seconds.  Neurological:     General: No focal deficit present.     Mental Status: She is alert and oriented to person, place, and time.  Psychiatric:        Mood and Affect: Mood normal.        Behavior: Behavior normal.        Thought Content: Thought content normal.        Judgment: Judgment normal.      Assessment/Plan:  Doing well no evidence of infection. Some prolonged healing from small dehiscence.  .  I will see her back in about 2 months.  No need for surgical intervention or hospitalization. I Spent 20 minutes in this encounter including  counseling the patient, placing orders and performing appropriate documentation.     Caroleen Hamman, MD [facility] General Surgeon

## 2022-10-23 DIAGNOSIS — J3089 Other allergic rhinitis: Secondary | ICD-10-CM | POA: Diagnosis not present

## 2022-10-23 DIAGNOSIS — J301 Allergic rhinitis due to pollen: Secondary | ICD-10-CM | POA: Diagnosis not present

## 2022-10-23 DIAGNOSIS — J3081 Allergic rhinitis due to animal (cat) (dog) hair and dander: Secondary | ICD-10-CM | POA: Diagnosis not present

## 2022-10-27 ENCOUNTER — Other Ambulatory Visit: Payer: Self-pay | Admitting: Nurse Practitioner

## 2022-10-27 ENCOUNTER — Other Ambulatory Visit: Payer: Self-pay | Admitting: Internal Medicine

## 2022-10-27 DIAGNOSIS — N959 Unspecified menopausal and perimenopausal disorder: Secondary | ICD-10-CM

## 2022-10-27 DIAGNOSIS — E039 Hypothyroidism, unspecified: Secondary | ICD-10-CM

## 2022-10-31 DIAGNOSIS — J3089 Other allergic rhinitis: Secondary | ICD-10-CM | POA: Diagnosis not present

## 2022-10-31 DIAGNOSIS — J3081 Allergic rhinitis due to animal (cat) (dog) hair and dander: Secondary | ICD-10-CM | POA: Diagnosis not present

## 2022-10-31 DIAGNOSIS — J301 Allergic rhinitis due to pollen: Secondary | ICD-10-CM | POA: Diagnosis not present

## 2022-11-06 DIAGNOSIS — J3089 Other allergic rhinitis: Secondary | ICD-10-CM | POA: Diagnosis not present

## 2022-11-06 DIAGNOSIS — J3081 Allergic rhinitis due to animal (cat) (dog) hair and dander: Secondary | ICD-10-CM | POA: Diagnosis not present

## 2022-11-06 DIAGNOSIS — J301 Allergic rhinitis due to pollen: Secondary | ICD-10-CM | POA: Diagnosis not present

## 2022-11-10 ENCOUNTER — Ambulatory Visit (INDEPENDENT_AMBULATORY_CARE_PROVIDER_SITE_OTHER): Payer: 59 | Admitting: Surgery

## 2022-11-10 ENCOUNTER — Encounter: Payer: Self-pay | Admitting: Surgery

## 2022-11-10 VITALS — BP 150/75 | HR 64 | Temp 98.1°F | Ht 59.0 in | Wt 164.4 lb

## 2022-11-10 DIAGNOSIS — Z09 Encounter for follow-up examination after completed treatment for conditions other than malignant neoplasm: Secondary | ICD-10-CM | POA: Diagnosis not present

## 2022-11-10 DIAGNOSIS — T8130XD Disruption of wound, unspecified, subsequent encounter: Secondary | ICD-10-CM

## 2022-11-10 NOTE — Patient Instructions (Signed)
Please call with any questions or concerns.

## 2022-11-11 DIAGNOSIS — J301 Allergic rhinitis due to pollen: Secondary | ICD-10-CM | POA: Diagnosis not present

## 2022-11-11 DIAGNOSIS — J3089 Other allergic rhinitis: Secondary | ICD-10-CM | POA: Diagnosis not present

## 2022-11-11 DIAGNOSIS — J3081 Allergic rhinitis due to animal (cat) (dog) hair and dander: Secondary | ICD-10-CM | POA: Diagnosis not present

## 2022-11-13 ENCOUNTER — Encounter: Payer: Self-pay | Admitting: Surgery

## 2022-11-13 NOTE — Progress Notes (Signed)
Outpatient Surgical Follow Up  11/13/2022  Claudia Martin is an 77 y.o. female.   Chief Complaint  Patient presents with   Follow-up    Abdominal wall reconstruction 05/2022    HPI: Claudia Martin is a 6 month out from abdominal wall reconstruction for a complex abdominal wall hernia. She has done well.  She did have a very small superficial dehiscence on the inferior portion of the wound that's has healed well. She completed antibiotics rx w good results. She has no complaints and everything has healed. She is ambulating and doing well  Past Medical History:  Diagnosis Date   Allergic rhinitis    Aortic atherosclerosis (HCC)    Arthritis    CAD (coronary artery disease) 06/06/2021   a.) STEMI --> LHC: EF 55-65%, 100% RI --> PCI performed placing a 2.5 x 18 mm Onxy Frontier DES   Cellulitis and abscess    Diastolic dysfunction 95/62/1308   a.) TTE 06/07/2021: EF 55-60%, no RWMAs, triv MR, mild AoV sclerosis with no stenosis, G1DD.   Diverticulosis    Enlarged heart    Eyelid inflammation    GERD (gastroesophageal reflux disease)    Hematuria syndrome    Hemorrhoids    Herpes genitalis    Herpes simplex    HTN (hypertension)    Hydronephrosis    Hypothyroidism    Incomplete bladder emptying    Internal hordeolum    Long term current use of antithrombotics/antiplatelets    a.) on daily DAPT therapy (ASA + ticagrelor)   OSA on CPAP    Primary ovarian failure    Sciatica    Shortness of breath    Sinus bradycardia    STEMI (ST elevation myocardial infarction) ([place]) 06/06/2021   a.) LHC/PCI --> 100% RI (2.5 x 18 m Onyx Frontier DES)   Urethral diverticulum    Vaginal atrophy     Past Surgical History:  Procedure Laterality Date   ABDOMINAL WALL DEFECT REPAIR N/A 05/27/2022   Procedure: REPAIR ABDOMINAL WALL, reconstruction;  Surgeon: Jules Husbands, MD;  Location: [facility];  Service: General;  Laterality: N/A;   BREAST BIOPSY     CATARACT EXTRACTION W/PHACO Right  06/05/2021   Procedure: CATARACT EXTRACTION PHACO AND INTRAOCULAR LENS PLACEMENT ([place]) RIGHT;  Surgeon: Leandrew Koyanagi, MD;  Location: [place];  Service: Ophthalmology;  Laterality: Right;  3.02 00:39.5   CATARACT EXTRACTION W/PHACO Left 12/04/2021   Procedure: CATARACT EXTRACTION PHACO AND INTRAOCULAR LENS PLACEMENT ([place]) LEFT;  Surgeon: Leandrew Koyanagi, MD;  Location: [place];  Service: Ophthalmology;  Laterality: Left;  6.23 00:48.0   COLON SURGERY     COLONOSCOPY WITH PROPOFOL N/A 03/15/2018   Procedure: COLONOSCOPY WITH PROPOFOL;  Surgeon: Lin Landsman, MD;  Location: [facility] ENDOSCOPY;  Service: Gastroenterology;  Laterality: N/A;   COLONOSCOPY WITH PROPOFOL N/A 01/09/2021   Procedure: COLONOSCOPY WITH PROPOFOL;  Surgeon: Lin Landsman, MD;  Location: [facility] ENDOSCOPY;  Service: Gastroenterology;  Laterality: N/A;   CORONARY/GRAFT ACUTE MI REVASCULARIZATION N/A 06/06/2021   Procedure: Coronary/Graft Acute MI Revascularization;  Surgeon: Yolonda Kida, MD;  Location: [place] [facility];  Service: Cardiovascular;  Laterality: N/A;   ECTOPIC PREGNANCY SURGERY     fx thumb     HERNIA REPAIR     INSERTION OF MESH N/A 05/27/2022   Procedure: INSERTION OF MESH;  Surgeon: Jules Husbands, MD;  Location: [facility];  Service: General;  Laterality: N/A;   LEFT HEART CATH AND CORONARY ANGIOGRAPHY N/A 06/06/2021  Procedure: LEFT HEART CATH AND CORONARY ANGIOGRAPHY;  Surgeon: Yolonda Kida, MD;  Location: [place] [facility];  Service: Cardiovascular;  Laterality: N/A;   VENTRAL HERNIA REPAIR N/A 05/27/2022   Procedure: HERNIA REPAIR VENTRAL ADULT, recurrent;  Surgeon: Jules Husbands, MD;  Location: [facility];  Service: General;  Laterality: N/A;    Family History  Problem Relation Age of Onset   Tuberculosis Mother    Nephrolithiasis Brother    Vision loss Daughter    Hypertension Daughter     Social History:  reports that she has never  smoked. She has never been exposed to tobacco smoke. She has never used smokeless tobacco. She reports that she does not drink alcohol and does not use drugs.  Allergies:  Allergies  Allergen Reactions   Other Diarrhea and Hives   Augmentin [Amoxicillin-Pot Clavulanate]    Bactrim [Sulfamethoxazole-Trimethoprim] Hives   Doxycycline    Ibuprofen     Other reaction(s): Unknown   Iodinated Contrast Media Diarrhea and Other (See Comments)   Macrobid [Nitrofurantoin]    Prednisone Nausea Only    Other reaction(s): Dizziness   Sulfa Antibiotics Other (See Comments)    Medications reviewed.    ROS Full ROS performed and is otherwise negative other than what is stated in HPI   BP (!) 150/75   Pulse 64   Temp 98.1 F (36.7 C) (Oral)   Ht '4\' 11"'$  (1.499 m)   Wt 164 lb 6.4 oz (74.6 kg)   SpO2 99%   BMI 33.20 kg/m   Physical Exam Vitals and nursing note reviewed. Exam conducted with a chaperone present.  Constitutional:      Appearance: Normal appearance.  Abdominal:     General: Abdomen is flat. There is no distension. Incision completely healed, no evidence if abscess, infection or open wound, no recurrence      Palpations: Abdomen is soft. There is no mass.     Tenderness: There is no abdominal tenderness. There is no guarding or rebound.     Hernia: No hernia is present.     General: Normal range of motion.     Cervical back: Normal range of motion and neck supple. No rigidity.  Skin:    General: Skin is warm.     Capillary Refill: Capillary refill takes less than 2 seconds.  Neurological:     General: No focal deficit present.     Mental Status: She is alert and oriented to person, place, and time.  Psychiatric:        Mood and Affect: Mood normal.        Behavior: Behavior normal.        Thought Content: Thought content normal.        Judgment: Judgment normal    Assessment/Plan: Doing very well after abdominal wall reconstruction.  No evidence of recurrence or  infection at this time.  Will follow-up on a as needed basis I Spent 20 minutes in this encounter including  counseling the patient, placing orders and performing appropriate documentation.     Caroleen Hamman, MD [facility] General Surgeon

## 2022-11-19 ENCOUNTER — Telehealth: Payer: Self-pay | Admitting: Nurse Practitioner

## 2022-11-19 NOTE — Telephone Encounter (Signed)
Left vm to confirm 11/26/2022 appointment-Toni

## 2022-11-20 DIAGNOSIS — J3081 Allergic rhinitis due to animal (cat) (dog) hair and dander: Secondary | ICD-10-CM | POA: Diagnosis not present

## 2022-11-20 DIAGNOSIS — J3089 Other allergic rhinitis: Secondary | ICD-10-CM | POA: Diagnosis not present

## 2022-11-20 DIAGNOSIS — J301 Allergic rhinitis due to pollen: Secondary | ICD-10-CM | POA: Diagnosis not present

## 2022-11-24 ENCOUNTER — Ambulatory Visit: Payer: 59 | Admitting: Nurse Practitioner

## 2022-11-26 ENCOUNTER — Ambulatory Visit (INDEPENDENT_AMBULATORY_CARE_PROVIDER_SITE_OTHER): Payer: 59

## 2022-11-26 DIAGNOSIS — G4733 Obstructive sleep apnea (adult) (pediatric): Secondary | ICD-10-CM | POA: Diagnosis not present

## 2022-11-26 NOTE — Progress Notes (Signed)
95 percentile pressure 9.8   95th percentile leak 0.3   apnea index 0.9 /hr  apnea-hypopnea index  1.2 /hr   total days used  >4 hr 90 days  total days used <4 hr 0 days  Total compliance 100 percent  She is doing great. She does have place on face she thinks from mask, she is going to ask next week at her appointment.    Pt was seen by Claiborne Billings  RRT/RCP  from [facility]

## 2022-11-27 DIAGNOSIS — J3081 Allergic rhinitis due to animal (cat) (dog) hair and dander: Secondary | ICD-10-CM | POA: Diagnosis not present

## 2022-11-27 DIAGNOSIS — J301 Allergic rhinitis due to pollen: Secondary | ICD-10-CM | POA: Diagnosis not present

## 2022-11-27 DIAGNOSIS — J3089 Other allergic rhinitis: Secondary | ICD-10-CM | POA: Diagnosis not present

## 2022-12-03 ENCOUNTER — Encounter: Payer: Self-pay | Admitting: Nurse Practitioner

## 2022-12-03 ENCOUNTER — Ambulatory Visit (INDEPENDENT_AMBULATORY_CARE_PROVIDER_SITE_OTHER): Payer: 59 | Admitting: Nurse Practitioner

## 2022-12-03 VITALS — BP 149/77 | HR 71 | Temp 97.6°F | Resp 16 | Ht 59.0 in | Wt 167.2 lb

## 2022-12-03 DIAGNOSIS — E559 Vitamin D deficiency, unspecified: Secondary | ICD-10-CM | POA: Diagnosis not present

## 2022-12-03 DIAGNOSIS — R7303 Prediabetes: Secondary | ICD-10-CM | POA: Diagnosis not present

## 2022-12-03 DIAGNOSIS — Z0001 Encounter for general adult medical examination with abnormal findings: Secondary | ICD-10-CM

## 2022-12-03 DIAGNOSIS — E782 Mixed hyperlipidemia: Secondary | ICD-10-CM

## 2022-12-03 DIAGNOSIS — I10 Essential (primary) hypertension: Secondary | ICD-10-CM | POA: Diagnosis not present

## 2022-12-03 DIAGNOSIS — Z23 Encounter for immunization: Secondary | ICD-10-CM | POA: Diagnosis not present

## 2022-12-03 DIAGNOSIS — E039 Hypothyroidism, unspecified: Secondary | ICD-10-CM

## 2022-12-03 DIAGNOSIS — R300 Dysuria: Secondary | ICD-10-CM | POA: Diagnosis not present

## 2022-12-03 DIAGNOSIS — M170 Bilateral primary osteoarthritis of knee: Secondary | ICD-10-CM | POA: Diagnosis not present

## 2022-12-03 MED ORDER — NITROGLYCERIN 0.4 MG SL SUBL: 0.4000 mg | SUBLINGUAL_TABLET | SUBLINGUAL | 3 refills | Status: DC | PRN

## 2022-12-03 MED ORDER — TETANUS-DIPHTH-ACELL PERTUSSIS 5-2.5-18.5 LF-MCG/0.5 IM SUSP: 0.5000 mL | Freq: Once | INTRAMUSCULAR | 0 refills | Status: AC

## 2022-12-03 MED ORDER — ZOSTER VAC RECOMB ADJUVANTED 50 MCG/0.5ML IM SUSR: 0.5000 mL | Freq: Once | INTRAMUSCULAR | 0 refills | Status: AC

## 2022-12-03 NOTE — Progress Notes (Signed)
[facility] [place], [place] [address]  Internal MEDICINE  Office Visit Note  Patient Name: Claudia Martin  [id number]  [id number]  Date of Service: 12/03/2022  Chief Complaint  Patient presents with   Medicare Wellness   Gastroesophageal Reflux   Hypertension    HPI Claudia Martin presents for an annual well visit and physical exam.  Well-appearing 77 y.o. female with high cholesterol, prediabetes, hypothyroidism, allergic rhinitis, OSA, vitamin D deficiency, and hx of STEMI. Routine CRC screening: due in 2027 Routine mammogram:done in October at [facility] in 2023 DEXA scan: done in October 2023, was normal  Labs: due for routine labs  New or worsening pain: none Other concerns: wants to talk to orthopedic about getting knee surgery now that her abdomen is repaired and feeling better.  Recently had RSV vaccine On rosuvastatin, atorvastatin was discontinued.        12/03/2022   11:11 AM 12/02/2021   11:23 AM 11/30/2020   11:45 AM  MMSE - Mini Mental State Exam  Orientation to time '5 5 5  '$ Orientation to Place '5 5 5  '$ Registration '3 3 3  '$ Attention/ Calculation '5 5 5  '$ Recall '3 3 3  '$ Language- name 2 objects '2 2 2  '$ Language- repeat '1 1 1  '$ Language- follow 3 step command '3 3 3  '$ Language- read & follow direction '1 1 1  '$ Write a sentence 0 1 1  Copy design '1 1 1  '$ Total score '29 30 30    '$ Functional Status Survey: Is the patient deaf or have difficulty hearing?: Yes Does the patient have difficulty seeing, even when wearing glasses/contacts?: Yes Does the patient have difficulty concentrating, remembering, or making decisions?: Yes Does the patient have difficulty walking or climbing stairs?: Yes Does the patient have difficulty dressing or bathing?: No Does the patient have difficulty doing errands alone such as visiting a doctor's office or shopping?: No     07/02/2022    9:57 AM 07/02/2022    9:59 AM 09/24/2022   10:27 AM 10/13/2022    1:44 PM  12/03/2022   11:10 AM  [place] in the past year? 0 0 0 0 0  Was there an injury with Fall?     0  Fall Risk Category Calculator     0  (RETIRED) Patient Fall Risk Level    Moderate fall risk   Patient at Risk for Falls Due to     No Fall Risks  Fall risk Follow up     Falls evaluation completed       12/03/2022   11:10 AM  Depression screen PHQ 2/9  Decreased Interest 0  Down, Depressed, Hopeless 0  PHQ - 2 Score 0        Current Medication: Outpatient Encounter Medications as of 12/03/2022  Medication Sig Note   acyclovir ointment (ZOVIRAX) 5 % Apply topically.    aspirin 81 MG chewable tablet Chew 1 tablet (81 mg total) by mouth daily.    cetirizine (ZYRTEC) 10 MG tablet Take 10 mg by mouth daily.    chlorhexidine (PERIDEX) 0.12 % solution USE 5 ML AS DIRECTED IN THE MOUTH OR THROAT TWICE DAILY    clindamycin (CLEOCIN) 300 MG capsule Take 1 capsule (300 mg total) by mouth 3 (three) times daily.    EPINEPHrine 0.3 mg/0.3 mL IJ SOAJ injection as needed.    ketoconazole (NIZORAL) 2 % cream Apply topically.    levothyroxine (SYNTHROID) 50 MCG tablet  TAKE 1 TABLET(50 MCG) BY MOUTH DAILY BEFORE AND BREAKFAST    losartan (COZAAR) 25 MG tablet Take 1 tablet by mouth daily.    magic mouthwash (nystatin, lidocaine, diphenhydrAMINE) suspension Take 5 mLs by mouth 4 (four) times daily as needed for mouth pain. Swiss and spit    rosuvastatin (CRESTOR) 5 MG tablet Take 5 mg by mouth daily.    Tdap (BOOSTRIX) 5-2.5-18.5 LF-MCG/0.5 injection Inject 0.5 mLs into the muscle once for 1 dose.    valACYclovir (VALTREX) 1000 MG tablet TAKE 1 TABLET(1000 MG) BY MOUTH DAILY. CAN TAKE 1 TABLET TWICE DAILY IF FLARE UP FOR 7 DAYS    Zoster Vaccine Adjuvanted [facility]) injection Inject 0.5 mLs into the muscle once for 1 dose.    [DISCONTINUED] atorvastatin (LIPITOR) 80 MG tablet Take 80 mg by mouth daily.    diphenhydrAMINE (BENADRYL) 50 MG tablet Take 1 tablet (50 mg total) by mouth once for  1 dose. Take one tablet within one hour of contrast injection    metoprolol tartrate (LOPRESSOR) 25 MG tablet Take 1 tablet (25 mg total) by mouth 2 (two) times daily.    nitroGLYCERIN (NITROSTAT) 0.4 MG SL tablet Place 1 tablet (0.4 mg total) under the tongue every 5 (five) minutes x 3 doses as needed for chest pain.    [DISCONTINUED] nitroGLYCERIN (NITROSTAT) 0.4 MG SL tablet Place 1 tablet (0.4 mg total) under the tongue every 5 (five) minutes x 3 doses as needed for chest pain. 12/04/2021: Never used    No facility-administered encounter medications on file as of 12/03/2022.    Surgical History: Past Surgical History:  Procedure Laterality Date   ABDOMINAL WALL DEFECT REPAIR N/A 05/27/2022   Procedure: REPAIR ABDOMINAL WALL, reconstruction;  Surgeon: Jules Husbands, MD;  Location: [facility] ORS;  Service: General;  Laterality: N/A;   BREAST BIOPSY     CATARACT EXTRACTION W/PHACO Right 06/05/2021   Procedure: CATARACT EXTRACTION PHACO AND INTRAOCULAR LENS PLACEMENT ([place]) RIGHT;  Surgeon: Leandrew Koyanagi, MD;  Location: [place];  Service: Ophthalmology;  Laterality: Right;  3.02 00:39.5   CATARACT EXTRACTION W/PHACO Left 12/04/2021   Procedure: CATARACT EXTRACTION PHACO AND INTRAOCULAR LENS PLACEMENT ([place]) LEFT;  Surgeon: Leandrew Koyanagi, MD;  Location: [place];  Service: Ophthalmology;  Laterality: Left;  6.23 00:48.0   COLON SURGERY     COLONOSCOPY WITH PROPOFOL N/A 03/15/2018   Procedure: COLONOSCOPY WITH PROPOFOL;  Surgeon: Lin Landsman, MD;  Location: [facility];  Service: Gastroenterology;  Laterality: N/A;   COLONOSCOPY WITH PROPOFOL N/A 01/09/2021   Procedure: COLONOSCOPY WITH PROPOFOL;  Surgeon: Lin Landsman, MD;  Location: [facility] ENDOSCOPY;  Service: Gastroenterology;  Laterality: N/A;   CORONARY/GRAFT ACUTE MI REVASCULARIZATION N/A 06/06/2021   Procedure: Coronary/Graft Acute MI Revascularization;  Surgeon: Yolonda Kida, MD;   Location: [place] CV LAB;  Service: Cardiovascular;  Laterality: N/A;   ECTOPIC PREGNANCY SURGERY     fx thumb     HERNIA REPAIR     INSERTION OF MESH N/A 05/27/2022   Procedure: INSERTION OF MESH;  Surgeon: Jules Husbands, MD;  Location: [facility];  Service: General;  Laterality: N/A;   LEFT HEART CATH AND CORONARY ANGIOGRAPHY N/A 06/06/2021   Procedure: LEFT HEART CATH AND CORONARY ANGIOGRAPHY;  Surgeon: Yolonda Kida, MD;  Location: [place] [facility];  Service: Cardiovascular;  Laterality: N/A;   VENTRAL HERNIA REPAIR N/A 05/27/2022   Procedure: HERNIA REPAIR VENTRAL ADULT, recurrent;  Surgeon: Jules Husbands, MD;  Location:  [facility] OR[facility];  Service: General;  Laterality: N/A;    Medical History: Past Medical History:  Diagnosis Date   Allergic rhinitis    Aortic atherosclerosis ([place])    Arthritis    CAD (coronary artery disease) 06/06/2021   a.) STEMI --> LHC: EF 55-65%, 100% RI --> PCI performed placing a 2.5 x 18 mm Onxy Frontier DES   Cellulitis and abscess    Diastolic dysfunction [id number]   a.) TTE 06/07/2021: EF 55-60%, no RWMAs, triv MR, mild AoV sclerosis with no stenosis, G1DD.   Diverticulosis    Enlarged heart    Eyelid inflammation    GERD (gastroesophageal reflux disease)    Hematuria syndrome    Hemorrhoids    Herpes genitalis    Herpes simplex    HTN (hypertension)    Hydronephrosis    Hypothyroidism    Incomplete bladder emptying    Internal hordeolum    Long term current use of antithrombotics/antiplatelets    a.) on daily DAPT therapy (ASA + ticagrelor)   OSA on CPAP    Primary ovarian failure    Sciatica    Shortness of breath    Sinus bradycardia    STEMI (ST elevation myocardial infarction) ([place]) 06/06/2021   a.) LHC/PCI --> 100% RI (2.5 x 18 m Onyx Frontier DES)   Urethral diverticulum    Vaginal atrophy     Family History: Family History  Problem Relation Age of Onset   Tuberculosis Mother    Nephrolithiasis Brother    Vision  loss Daughter    Hypertension Daughter     Social History   Socioeconomic History   Marital status: Divorced    Spouse name: Not on file   Number of children: Not on file   Years of education: Not on file   Highest education level: Not on file  Occupational History   Not on file  Tobacco Use   Smoking status: Never    Passive exposure: Never   Smokeless tobacco: Never  Vaping Use   Vaping Use: Never used  Substance and Sexual Activity   Alcohol use: No   Drug use: Never   Sexual activity: Not on file  Other Topics Concern   Not on file  Social History Narrative   Not on file   Social Determinants of Health   Financial Resource Strain: Not on file  Food Insecurity: Not on file  Transportation Needs: Not on file  Physical Activity: Not on file  Stress: Not on file  Social Connections: Not on file  Intimate Partner Violence: Not on file      Review of Systems  Constitutional:  Negative for activity change, appetite change, chills, fatigue, fever and unexpected weight change.  HENT: Negative.  Negative for congestion, ear pain, rhinorrhea, sore throat and trouble swallowing.   Eyes: Negative.   Respiratory: Negative.  Negative for cough, chest tightness, shortness of breath and wheezing.   Cardiovascular: Negative.  Negative for chest pain.  Gastrointestinal: Negative.  Negative for abdominal pain, blood in stool, constipation, diarrhea, nausea and vomiting.  Endocrine: Negative.   Genitourinary: Negative.  Negative for difficulty urinating, dysuria, frequency, hematuria and urgency.  Musculoskeletal: Negative.  Negative for arthralgias, back pain, joint swelling, myalgias and neck pain.  Skin: Negative.  Negative for rash and wound.  Allergic/Immunologic: Negative.  Negative for immunocompromised state.  Neurological: Negative.  Negative for dizziness, seizures, numbness and headaches.  Hematological: Negative.   Psychiatric/Behavioral: Negative.  Negative for  behavioral problems, self-injury and  suicidal ideas. The patient is not nervous/anxious.     Vital Signs: BP (!) 149/77   Pulse 71   Temp 97.6 F (36.4 C)   Resp 16   Ht '4\' 11"'$  (1.499 m)   Wt 167 lb 3.2 oz (75.8 kg)   SpO2 98%   BMI 33.77 kg/m    Physical Exam Vitals reviewed.  Constitutional:      General: She is not in acute distress.    Appearance: Normal appearance. She is well-developed. She is obese. She is not ill-appearing or diaphoretic.  HENT:     Head: Normocephalic and atraumatic.     Right Ear: Tympanic membrane, ear canal and external ear normal.     Left Ear: Tympanic membrane, ear canal and external ear normal.     Nose: Nose normal. No congestion or rhinorrhea.     Mouth/Throat:     Mouth: Mucous membranes are moist.     Pharynx: Oropharynx is clear. No oropharyngeal exudate or posterior oropharyngeal erythema.  Eyes:     General: No scleral icterus.       Right eye: No discharge.        Left eye: No discharge.     Extraocular Movements: Extraocular movements intact.     Conjunctiva/sclera: Conjunctivae normal.     Pupils: Pupils are equal, round, and reactive to light.  Neck:     Thyroid: No thyromegaly.     Vascular: No JVD.     Trachea: No tracheal deviation.  Cardiovascular:     Rate and Rhythm: Normal rate and regular rhythm.     Pulses: Normal pulses.     Heart sounds: Normal heart sounds. No murmur heard.    No friction rub. No gallop.  Pulmonary:     Effort: Pulmonary effort is normal. No respiratory distress.     Breath sounds: Normal breath sounds. No stridor. No wheezing or rales.  Chest:     Chest wall: No tenderness.  Abdominal:     General: Bowel sounds are normal. There is no distension.     Palpations: Abdomen is soft. There is no mass.     Tenderness: There is no abdominal tenderness. There is no guarding or rebound.  Musculoskeletal:        General: No tenderness or deformity. Normal range of motion.     Cervical back: Normal  range of motion and neck supple.  Lymphadenopathy:     Cervical: No cervical adenopathy.  Skin:    General: Skin is warm and dry.     Capillary Refill: Capillary refill takes less than 2 seconds.     Coloration: Skin is not pale.     Findings: No erythema or rash.  Neurological:     Mental Status: She is alert and oriented to person, place, and time.     Cranial Nerves: No cranial nerve deficit.     Motor: No abnormal muscle tone.     Coordination: Coordination normal.     Gait: Gait normal.     Deep Tendon Reflexes: Reflexes are normal and symmetric.  Psychiatric:        Mood and Affect: Mood normal.        Behavior: Behavior normal.        Thought Content: Thought content normal.        Judgment: Judgment normal.        Assessment/Plan: 1. Encounter for routine adult health examination with abnormal findings Age-appropriate preventive screenings and vaccinations discussed, annual physical exam  completed. Routine labs for health maintenance ordered, see below. PHM updated.  - nitroGLYCERIN (NITROSTAT) 0.4 MG SL tablet; Place 1 tablet (0.4 mg total) under the tongue every 5 (five) minutes x 3 doses as needed for chest pain.  Dispense: 30 tablet; Refill: 3  2. Prediabetes Routine labs ordered - CBC with Differential/Platelet - CMP14+EGFR - Lipid Profile - TSH + free T4 - Vitamin D (25 hydroxy) - Hgb A1C w/o eAG  3. Essential hypertension Routine labs ordered - CBC with Differential/Platelet - CMP14+EGFR - Lipid Profile - TSH + free T4 - Vitamin D (25 hydroxy) - Hgb A1C w/o eAG  4. Acquired hypothyroidism Routine labs ordered - Lipid Profile - TSH + free T4  5. Mixed hyperlipidemia Continue rosuvastatin as prescribed. Routine labs ordered - CBC with Differential/Platelet - CMP14+EGFR - Lipid Profile - TSH + free T4 - Vitamin D (25 hydroxy) - Hgb A1C w/o eAG - rosuvastatin (CRESTOR) 5 MG tablet; Take 5 mg by mouth daily.  6. Vitamin D deficiency Routine  lab ordered - Vitamin D (25 hydroxy)  7. Need for vaccination - Tdap ([place]) 5-2.5-18.5 LF-MCG/0.5 injection; Inject 0.5 mLs into the muscle once for 1 dose.  Dispense: 0.5 mL; Refill: 0 - Zoster Vaccine Adjuvanted [facility]) injection; Inject 0.5 mLs into the muscle once for 1 dose.  Dispense: 0.5 mL; Refill: 0     General Counseling: Ayasha verbalizes understanding of the findings of todays visit and agrees with plan of treatment. I have discussed any further diagnostic evaluation that may be needed or ordered today. We also reviewed her medications today. she has been encouraged to call the office with any questions or concerns that should arise related to todays visit.    Orders Placed This Encounter  Procedures   CBC with Differential/Platelet   CMP14+EGFR   Lipid Profile   TSH + free T4   Vitamin D (25 hydroxy)   Hgb A1C w/o eAG    Meds ordered this encounter  Medications   Tdap (BOOSTRIX) 5-2.5-18.5 LF-MCG/0.5 injection    Sig: Inject 0.5 mLs into the muscle once for 1 dose.    Dispense:  0.5 mL    Refill:  0   Zoster Vaccine Adjuvanted [facility]) injection    Sig: Inject 0.5 mLs into the muscle once for 1 dose.    Dispense:  0.5 mL    Refill:  0   nitroGLYCERIN (NITROSTAT) 0.4 MG SL tablet    Sig: Place 1 tablet (0.4 mg total) under the tongue every 5 (five) minutes x 3 doses as needed for chest pain.    Dispense:  30 tablet    Refill:  3    Return in about 3 months (around 03/03/2023) for F/U, Claudia Martin PCP.   Total time spent:30 Minutes Time spent includes review of chart, medications, test results, and follow up plan with the patient.    Controlled Substance Database was reviewed by me.  This patient was seen by Jonetta Osgood, FNP-C in collaboration with Dr. Clayborn Bigness as a part of collaborative care agreement.  Claudia Bene R. Valetta Fuller, MSN, FNP-C Internal medicine

## 2022-12-03 NOTE — Addendum Note (Signed)
Addended by: Tyron Russell on: 12/03/2022 04:00 PM   Modules accepted: Orders

## 2022-12-04 DIAGNOSIS — J3089 Other allergic rhinitis: Secondary | ICD-10-CM | POA: Diagnosis not present

## 2022-12-04 DIAGNOSIS — J301 Allergic rhinitis due to pollen: Secondary | ICD-10-CM | POA: Diagnosis not present

## 2022-12-04 DIAGNOSIS — J3081 Allergic rhinitis due to animal (cat) (dog) hair and dander: Secondary | ICD-10-CM | POA: Diagnosis not present

## 2022-12-05 ENCOUNTER — Telehealth: Payer: Self-pay | Admitting: Nurse Practitioner

## 2022-12-05 NOTE — Telephone Encounter (Signed)
Orthopedic referral sent via [facility] to [facility]

## 2022-12-09 ENCOUNTER — Telehealth: Payer: Self-pay | Admitting: Nurse Practitioner

## 2022-12-09 LAB — UA/M W/RFLX CULTURE, ROUTINE
Bilirubin, UA: NEGATIVE
Glucose, UA: NEGATIVE
Ketones, UA: NEGATIVE
Nitrite, UA: NEGATIVE
Protein,UA: NEGATIVE
RBC, UA: NEGATIVE
Specific Gravity, UA: 1.018 (ref 1.005–1.030)
Urobilinogen, Ur: 0.2 mg/dL (ref 0.2–1.0)
pH, UA: 5.5 (ref 5.0–7.5)

## 2022-12-09 LAB — URINE CULTURE, REFLEX

## 2022-12-09 LAB — MICROSCOPIC EXAMINATION
Casts: NONE SEEN /lpf
WBC, UA: >30 /hpf — AB (ref 0–5)

## 2022-12-09 NOTE — Telephone Encounter (Signed)
Orthopedic appointment>> 01/14/23 with [facility]

## 2022-12-11 ENCOUNTER — Ambulatory Visit: Payer: 59 | Admitting: Nurse Practitioner

## 2022-12-12 ENCOUNTER — Ambulatory Visit (INDEPENDENT_AMBULATORY_CARE_PROVIDER_SITE_OTHER): Payer: 59 | Admitting: Physician Assistant

## 2022-12-12 ENCOUNTER — Encounter: Payer: Self-pay | Admitting: Physician Assistant

## 2022-12-12 VITALS — BP 143/69 | HR 71 | Temp 98.3°F | Resp 16 | Ht 59.0 in | Wt 169.0 lb

## 2022-12-12 DIAGNOSIS — G4733 Obstructive sleep apnea (adult) (pediatric): Secondary | ICD-10-CM

## 2022-12-12 DIAGNOSIS — Z7189 Other specified counseling: Secondary | ICD-10-CM

## 2022-12-12 DIAGNOSIS — E669 Obesity, unspecified: Secondary | ICD-10-CM

## 2022-12-12 DIAGNOSIS — J301 Allergic rhinitis due to pollen: Secondary | ICD-10-CM | POA: Diagnosis not present

## 2022-12-12 DIAGNOSIS — I10 Essential (primary) hypertension: Secondary | ICD-10-CM

## 2022-12-12 DIAGNOSIS — J3089 Other allergic rhinitis: Secondary | ICD-10-CM | POA: Diagnosis not present

## 2022-12-12 DIAGNOSIS — J3081 Allergic rhinitis due to animal (cat) (dog) hair and dander: Secondary | ICD-10-CM | POA: Diagnosis not present

## 2022-12-12 NOTE — Progress Notes (Signed)
[facility] [place], [place] [address]  Pulmonary Sleep Medicine   Office Visit Note  Patient Name: Claudia Martin DOB: 15-Feb-1946 MRN [id number]  Date of Service: 12/12/2022  Complaints/HPI: pt is here for routine pulmonary follow up. Doing well with cpap. She wears it nightly. She feels well rested and is benefiting from cpap use. She cleans and changes her supplies regularly. She reports she thinks her BF has apnea as well and is going to try to convince him to undergo evaluation/testing. She denies any SOB, headaches, or dryness. Her compliance is excellent and Ahi well controlled. She is going to keep an eye on her BP and states her only complaint right now is her knees and is seeing ortho next month.  Compliance Download:  95 percentile pressure 9.8    95th percentile leak 0.3    apnea index 0.9 /hr  apnea-hypopnea index  1.2 /hr    total days used  >4 hr 90 days  total days used <4 hr 0 days   Total compliance 100 percent      ROS  General: (-) fever, (-) chills, (-) night sweats, (-) weakness Skin: (-) rashes, (-) itching,. Eyes: (-) visual changes, (-) redness, (-) itching. Nose and Sinuses: (-) nasal stuffiness or itchiness, (-) postnasal drip, (-) nosebleeds, (-) sinus trouble. Mouth and Throat: (-) sore throat, (-) hoarseness. Neck: (-) swollen glands, (-) enlarged thyroid, (-) neck pain. Respiratory: - cough, (-) bloody sputum, - shortness of breath, - wheezing. Cardiovascular: - ankle swelling, (-) chest pain. Lymphatic: (-) lymph node enlargement. Neurologic: (-) numbness, (-) tingling. Psychiatric: (-) anxiety, (-) depression   Current Medication: Outpatient Encounter Medications as of 12/12/2022  Medication Sig   acyclovir ointment (ZOVIRAX) 5 % Apply topically.   aspirin 81 MG chewable tablet Chew 1 tablet (81 mg total) by mouth daily.   cetirizine (ZYRTEC) 10 MG tablet Take 10 mg by mouth daily.   chlorhexidine  (PERIDEX) 0.12 % solution USE 5 ML AS DIRECTED IN THE MOUTH OR THROAT TWICE DAILY   clindamycin (CLEOCIN) 300 MG capsule Take 1 capsule (300 mg total) by mouth 3 (three) times daily.   EPINEPHrine 0.3 mg/0.3 mL IJ SOAJ injection as needed.   ketoconazole (NIZORAL) 2 % cream Apply topically.   levothyroxine (SYNTHROID) 50 MCG tablet TAKE 1 TABLET(50 MCG) BY MOUTH DAILY BEFORE AND BREAKFAST   losartan (COZAAR) 25 MG tablet Take 1 tablet by mouth daily.   magic mouthwash (nystatin, lidocaine, diphenhydrAMINE) suspension Take 5 mLs by mouth 4 (four) times daily as needed for mouth pain. Swiss and spit   nitroGLYCERIN (NITROSTAT) 0.4 MG SL tablet Place 1 tablet (0.4 mg total) under the tongue every 5 (five) minutes x 3 doses as needed for chest pain.   rosuvastatin (CRESTOR) 5 MG tablet Take 5 mg by mouth daily.   valACYclovir (VALTREX) 1000 MG tablet TAKE 1 TABLET(1000 MG) BY MOUTH DAILY. CAN TAKE 1 TABLET TWICE DAILY IF FLARE UP FOR 7 DAYS   diphenhydrAMINE (BENADRYL) 50 MG tablet Take 1 tablet (50 mg total) by mouth once for 1 dose. Take one tablet within one hour of contrast injection   metoprolol tartrate (LOPRESSOR) 25 MG tablet Take 1 tablet (25 mg total) by mouth 2 (two) times daily.   No facility-administered encounter medications on file as of 12/12/2022.    Surgical History: Past Surgical History:  Procedure Laterality Date   ABDOMINAL WALL DEFECT REPAIR N/A 05/27/2022   Procedure: REPAIR ABDOMINAL WALL,  reconstruction;  Surgeon: Jules Husbands, MD;  Location: [facility];  Service: General;  Laterality: N/A;   BREAST BIOPSY     CATARACT EXTRACTION W/PHACO Right 06/05/2021   Procedure: CATARACT EXTRACTION PHACO AND INTRAOCULAR LENS PLACEMENT ([place]) RIGHT;  Surgeon: Leandrew Koyanagi, MD;  Location: [place];  Service: Ophthalmology;  Laterality: Right;  3.02 00:39.5   CATARACT EXTRACTION W/PHACO Left 12/04/2021   Procedure: CATARACT EXTRACTION PHACO AND INTRAOCULAR LENS  PLACEMENT ([place]) LEFT;  Surgeon: Leandrew Koyanagi, MD;  Location: [place];  Service: Ophthalmology;  Laterality: Left;  6.23 00:48.0   COLON SURGERY     COLONOSCOPY WITH PROPOFOL N/A 03/15/2018   Procedure: COLONOSCOPY WITH PROPOFOL;  Surgeon: Lin Landsman, MD;  Location: [facility];  Service: Gastroenterology;  Laterality: N/A;   COLONOSCOPY WITH PROPOFOL N/A 01/09/2021   Procedure: COLONOSCOPY WITH PROPOFOL;  Surgeon: Lin Landsman, MD;  Location: [facility];  Service: Gastroenterology;  Laterality: N/A;   CORONARY/GRAFT ACUTE MI REVASCULARIZATION N/A 06/06/2021   Procedure: Coronary/Graft Acute MI Revascularization;  Surgeon: Yolonda Kida, MD;  Location: [place] [facility];  Service: Cardiovascular;  Laterality: N/A;   ECTOPIC PREGNANCY SURGERY     fx thumb     HERNIA REPAIR     INSERTION OF MESH N/A 05/27/2022   Procedure: INSERTION OF MESH;  Surgeon: Jules Husbands, MD;  Location: [facility];  Service: General;  Laterality: N/A;   LEFT HEART CATH AND CORONARY ANGIOGRAPHY N/A 06/06/2021   Procedure: LEFT HEART CATH AND CORONARY ANGIOGRAPHY;  Surgeon: Yolonda Kida, MD;  Location: [place] [facility];  Service: Cardiovascular;  Laterality: N/A;   VENTRAL HERNIA REPAIR N/A 05/27/2022   Procedure: HERNIA REPAIR VENTRAL ADULT, recurrent;  Surgeon: Jules Husbands, MD;  Location: [facility];  Service: General;  Laterality: N/A;    Medical History: Past Medical History:  Diagnosis Date   Allergic rhinitis    Aortic atherosclerosis ([place])    Arthritis    CAD (coronary artery disease) 06/06/2021   a.) STEMI --> LHC: EF 55-65%, 100% RI --> PCI performed placing a 2.5 x 18 mm Onxy Frontier DES   Cellulitis and abscess    Diastolic dysfunction [id number]   a.) TTE 06/07/2021: EF 55-60%, no RWMAs, triv MR, mild AoV sclerosis with no stenosis, G1DD.   Diverticulosis    Enlarged heart    Eyelid inflammation    GERD (gastroesophageal reflux disease)     Hematuria syndrome    Hemorrhoids    Herpes genitalis    Herpes simplex    HTN (hypertension)    Hydronephrosis    Hypothyroidism    Incomplete bladder emptying    Internal hordeolum    Long term current use of antithrombotics/antiplatelets    a.) on daily DAPT therapy (ASA + ticagrelor)   OSA on CPAP    Primary ovarian failure    Sciatica    Shortness of breath    Sinus bradycardia    STEMI (ST elevation myocardial infarction) ([place]) 06/06/2021   a.) LHC/PCI --> 100% RI (2.5 x 18 m Onyx Frontier DES)   Urethral diverticulum    Vaginal atrophy     Family History: Family History  Problem Relation Age of Onset   Tuberculosis Mother    Nephrolithiasis Brother    Vision loss Daughter    Hypertension Daughter     Social History: Social History   Socioeconomic History   Marital status: Divorced    Spouse name: Not on file  Number of children: Not on file   Years of education: Not on file   Highest education level: Not on file  Occupational History   Not on file  Tobacco Use   Smoking status: Never    Passive exposure: Never   Smokeless tobacco: Never  Vaping Use   Vaping Use: Never used  Substance and Sexual Activity   Alcohol use: No   Drug use: Never   Sexual activity: Not on file  Other Topics Concern   Not on file  Social History Narrative   Not on file   Social Determinants of Health   Financial Resource Strain: Not on file  Food Insecurity: Not on file  Transportation Needs: Not on file  Physical Activity: Not on file  Stress: Not on file  Social Connections: Not on file  Intimate Partner Violence: Not on file    Vital Signs: Blood pressure (!) 143/69, pulse 71, temperature 98.3 F (36.8 C), resp. rate 16, height '4\' 11"'$  (1.499 m), weight 169 lb (76.7 kg), SpO2 97 %.  Examination: General Appearance: The patient is well-developed, well-nourished, and in no distress. Skin: Gross inspection of skin unremarkable. Head: normocephalic, no gross  deformities. Eyes: no gross deformities noted. ENT: ears appear grossly normal no exudates. Neck: Supple. No thyromegaly. No LAD. Respiratory: Lungs clear to auscultation bilaterally. Cardiovascular: Normal S1 and S2 without murmur or rub. Extremities: No cyanosis. pulses are equal. Neurologic: Alert and oriented. No involuntary movements.  LABS: Recent Results (from the past 2160 hour(s))  UA/M w/rflx Culture, Routine     Status: Abnormal   Collection Time: 12/03/22  4:00 PM   Specimen: Urine   Urine  Result Value Ref Range   Specific Gravity, UA 1.018 1.005 - 1.030   pH, UA 5.5 5.0 - 7.5   Color, UA Yellow Yellow   Appearance Ur Turbid (A) Clear   Leukocytes,UA 1+ (A) Negative   Protein,UA Negative Negative/Trace   Glucose, UA Negative Negative   Ketones, UA Negative Negative   RBC, UA Negative Negative   Bilirubin, UA Negative Negative   Urobilinogen, Ur 0.2 0.2 - 1.0 mg/dL   Nitrite, UA Negative Negative   Microscopic Examination See below:     Comment: Microscopic was indicated and was performed.   Urinalysis Reflex Comment     Comment: This specimen has reflexed to a Urine Culture.  Microscopic Examination     Status: Abnormal   Collection Time: 12/03/22  4:00 PM   Urine  Result Value Ref Range   WBC, UA >30 (A) 0 - 5 /hpf   RBC, Urine 0-2 0 - 2 /hpf   Epithelial Cells (non renal) 0-10 0 - 10 /hpf   Casts None seen None seen /lpf   Bacteria, UA Many (A) None seen/Few  Urine Culture, Reflex     Status: Abnormal   Collection Time: 12/03/22  4:00 PM   Urine  Result Value Ref Range   Urine Culture, Routine Final report (A)    Organism ID, Bacteria Comment (A)     Comment: Escherichia coli, identified by an automated biochemical system. Cefazolin with an MIC <=16 predicts susceptibility to the oral agents cefaclor, cefdinir, cefpodoxime, cefprozil, cefuroxime, cephalexin, and loracarbef when used for therapy of uncomplicated urinary tract infections due to E. coli,  Klebsiella pneumoniae, and Proteus mirabilis. Greater than 100,000 colony forming units per mL    Antimicrobial Susceptibility Comment     Comment:       ** S = Susceptible; I =  Intermediate; R = Resistant **                    P = Positive; N = Negative             MICS are expressed in micrograms per mL    Antibiotic                 RSLT#1    RSLT#2    RSLT#3    RSLT#4 Amoxicillin/Clavulanic Acid    I Ampicillin                     R Cefazolin                      S Cefepime                       S Ceftriaxone                    S Cefuroxime                     S Ciprofloxacin                  R Ertapenem                      S Gentamicin                     S Imipenem                       S Levofloxacin                   R Meropenem                      S Nitrofurantoin                 S Piperacillin/Tazobactam        R Tetracycline                   S Tobramycin                     S Trimethoprim/Sulfa             S     Radiology: No results found.  No results found.  No results found.    Assessment and Plan: Patient Active Problem List   Diagnosis Date Noted   Allergic rhinitis 07/02/2022   Wound dehiscence    S/P hernia repair 05/27/2022   Chronic allergic conjunctivitis 12/02/2021   Allergic rhinitis due to animal (cat) (dog) hair and dander 12/02/2021   Allergic rhinitis due to pollen 12/02/2021   STEMI (ST elevation myocardial infarction) ([place]) 06/06/2021   History of colonic polyps    Candidiasis of skin 01/04/2020   Ventral hernia without obstruction or gangrene 11/28/2019   Herpes simplex infection 10/27/2019   Stomatitis and mucositis 10/27/2019   Vitamin B12 deficiency 06/30/2018   Unspecified menopausal and perimenopausal disorder 06/30/2018   Vitamin D deficiency 06/30/2018   Acquired hypothyroidism 02/05/2018   Essential hypertension 02/05/2018   Obstructive sleep apnea (adult) (pediatric) 01/06/2018    1. Obstructive sleep apnea  [G47.33] Continue excellent compliance  2. CPAP use counseling CPAP couseling-Discussed importance of adequate CPAP use as well as proper care and cleaning  techniques of machine and all supplies.  3. Essential hypertension Continue current medication and f/u with PCP.  4. Obesity (BMI 30-39.9) Obesity Counseling: Had a lengthy discussion regarding patients BMI and weight issues. Patient was instructed on portion control as well as increased activity. Also discussed caloric restrictions with trying to maintain intake less than 2000 Kcal. Discussions were made in accordance with the 5As of weight management. Simple actions such as not eating late and if able to, taking a walk is suggested.    General Counseling: I have discussed the findings of the evaluation and examination with Verdis Frederickson.  I have also discussed any further diagnostic evaluation thatmay be needed or ordered today. Aeva verbalizes understanding of the findings of todays visit. We also reviewed her medications today and discussed drug interactions and side effects including but not limited excessive drowsiness and altered mental states. We also discussed that there is always a risk not just to her but also people around her. she has been encouraged to call the office with any questions or concerns that should arise related to todays visit.  No orders of the defined types were placed in this encounter.    Time spent: 30  I have personally obtained a history, examined the patient, evaluated laboratory and imaging results, formulated the assessment and plan and placed orders. This patient was seen by Drema Dallas, PA-C in collaboration with Dr. Devona Konig as a part of collaborative care agreement.     Allyne Gee, MD [facility] Pulmonary and Critical Care Sleep medicine

## 2022-12-15 DIAGNOSIS — E039 Hypothyroidism, unspecified: Secondary | ICD-10-CM | POA: Diagnosis not present

## 2022-12-15 DIAGNOSIS — E782 Mixed hyperlipidemia: Secondary | ICD-10-CM | POA: Diagnosis not present

## 2022-12-15 DIAGNOSIS — I10 Essential (primary) hypertension: Secondary | ICD-10-CM | POA: Diagnosis not present

## 2022-12-15 DIAGNOSIS — E559 Vitamin D deficiency, unspecified: Secondary | ICD-10-CM | POA: Diagnosis not present

## 2022-12-15 DIAGNOSIS — R7303 Prediabetes: Secondary | ICD-10-CM | POA: Diagnosis not present

## 2022-12-16 LAB — CBC WITH DIFFERENTIAL/PLATELET
Basophils Absolute: 0.1 10*3/uL (ref 0.0–0.2)
Basos: 1 %
EOS (ABSOLUTE): 0.2 10*3/uL (ref 0.0–0.4)
Eos: 4 %
Hematocrit: 41.5 % (ref 34.0–46.6)
Hemoglobin: 13.4 g/dL (ref 11.1–15.9)
Immature Grans (Abs): 0 10*3/uL (ref 0.0–0.1)
Immature Granulocytes: 0 %
Lymphocytes Absolute: 1.5 10*3/uL (ref 0.7–3.1)
Lymphs: 26 %
MCH: 29.1 pg (ref 26.6–33.0)
MCHC: 32.3 g/dL (ref 31.5–35.7)
MCV: 90 fL (ref 79–97)
Monocytes Absolute: 0.4 10*3/uL (ref 0.1–0.9)
Monocytes: 7 %
Neutrophils Absolute: 3.4 10*3/uL (ref 1.4–7.0)
Neutrophils: 62 %
Platelets: 187 10*3/uL (ref 150–450)
RBC: 4.61 x10E6/uL (ref 3.77–5.28)
RDW: 13.8 % (ref 11.7–15.4)
WBC: 5.5 10*3/uL (ref 3.4–10.8)

## 2022-12-16 LAB — CMP14+EGFR
ALT: 20 IU/L (ref 0–32)
AST: 24 IU/L (ref 0–40)
Albumin/Globulin Ratio: 1.3 (ref 1.2–2.2)
Albumin: 4.3 g/dL (ref 3.8–4.8)
Alkaline Phosphatase: 138 IU/L — ABNORMAL HIGH (ref 44–121)
BUN/Creatinine Ratio: 29 — ABNORMAL HIGH (ref 12–28)
BUN: 25 mg/dL (ref 8–27)
Bilirubin Total: 0.5 mg/dL (ref 0.0–1.2)
CO2: 25 mmol/L (ref 20–29)
Calcium: 9.4 mg/dL (ref 8.7–10.3)
Chloride: 104 mmol/L (ref 96–106)
Creatinine, Ser: 0.86 mg/dL (ref 0.57–1.00)
Globulin, Total: 3.4 g/dL (ref 1.5–4.5)
Glucose: 97 mg/dL (ref 70–99)
Potassium: 4.7 mmol/L (ref 3.5–5.2)
Sodium: 141 mmol/L (ref 134–144)
Total Protein: 7.7 g/dL (ref 6.0–8.5)
eGFR: 70 mL/min/{1.73_m2} (ref >59)

## 2022-12-16 LAB — LIPID PANEL
Chol/HDL Ratio: 3.3 ratio (ref 0.0–4.4)
Cholesterol, Total: 124 mg/dL (ref 100–199)
HDL: 38 mg/dL — ABNORMAL LOW (ref >39)
LDL Chol Calc (NIH): 64 mg/dL (ref 0–99)
Triglycerides: 119 mg/dL (ref 0–149)
VLDL Cholesterol Cal: 22 mg/dL (ref 5–40)

## 2022-12-16 LAB — TSH+FREE T4
Free T4: 1.02 ng/dL (ref 0.82–1.77)
TSH: 1.83 u[IU]/mL (ref 0.450–4.500)

## 2022-12-16 LAB — HGB A1C W/O EAG: Hgb A1c MFr Bld: 5.9 % — ABNORMAL HIGH (ref 4.8–5.6)

## 2022-12-16 LAB — VITAMIN D 25 HYDROXY (VIT D DEFICIENCY, FRACTURES): Vit D, 25-Hydroxy: 34.6 ng/mL (ref 30.0–100.0)

## 2022-12-18 DIAGNOSIS — J3081 Allergic rhinitis due to animal (cat) (dog) hair and dander: Secondary | ICD-10-CM | POA: Diagnosis not present

## 2022-12-18 DIAGNOSIS — J3089 Other allergic rhinitis: Secondary | ICD-10-CM | POA: Diagnosis not present

## 2022-12-18 DIAGNOSIS — J301 Allergic rhinitis due to pollen: Secondary | ICD-10-CM | POA: Diagnosis not present

## 2022-12-26 DIAGNOSIS — J301 Allergic rhinitis due to pollen: Secondary | ICD-10-CM | POA: Diagnosis not present

## 2022-12-26 DIAGNOSIS — J3089 Other allergic rhinitis: Secondary | ICD-10-CM | POA: Diagnosis not present

## 2022-12-26 DIAGNOSIS — J3081 Allergic rhinitis due to animal (cat) (dog) hair and dander: Secondary | ICD-10-CM | POA: Diagnosis not present

## 2022-12-29 NOTE — Progress Notes (Signed)
Labs results are normal except for low HDL on cholesterol panel.

## 2023-01-01 DIAGNOSIS — J3089 Other allergic rhinitis: Secondary | ICD-10-CM | POA: Diagnosis not present

## 2023-01-01 DIAGNOSIS — J301 Allergic rhinitis due to pollen: Secondary | ICD-10-CM | POA: Diagnosis not present

## 2023-01-01 DIAGNOSIS — J3081 Allergic rhinitis due to animal (cat) (dog) hair and dander: Secondary | ICD-10-CM | POA: Diagnosis not present

## 2023-01-04 DIAGNOSIS — G4733 Obstructive sleep apnea (adult) (pediatric): Secondary | ICD-10-CM | POA: Diagnosis not present

## 2023-01-07 DIAGNOSIS — I2121 ST elevation (STEMI) myocardial infarction involving left circumflex coronary artery: Secondary | ICD-10-CM | POA: Diagnosis not present

## 2023-01-07 DIAGNOSIS — G4733 Obstructive sleep apnea (adult) (pediatric): Secondary | ICD-10-CM | POA: Diagnosis not present

## 2023-01-07 DIAGNOSIS — E782 Mixed hyperlipidemia: Secondary | ICD-10-CM | POA: Diagnosis not present

## 2023-01-07 DIAGNOSIS — Z955 Presence of coronary angioplasty implant and graft: Secondary | ICD-10-CM | POA: Diagnosis not present

## 2023-01-07 DIAGNOSIS — I2510 Atherosclerotic heart disease of native coronary artery without angina pectoris: Secondary | ICD-10-CM | POA: Diagnosis not present

## 2023-01-07 DIAGNOSIS — I10 Essential (primary) hypertension: Secondary | ICD-10-CM | POA: Diagnosis not present

## 2023-01-09 DIAGNOSIS — J3089 Other allergic rhinitis: Secondary | ICD-10-CM | POA: Diagnosis not present

## 2023-01-09 DIAGNOSIS — J301 Allergic rhinitis due to pollen: Secondary | ICD-10-CM | POA: Diagnosis not present

## 2023-01-09 DIAGNOSIS — J3081 Allergic rhinitis due to animal (cat) (dog) hair and dander: Secondary | ICD-10-CM | POA: Diagnosis not present

## 2023-01-14 DIAGNOSIS — M25561 Pain in right knee: Secondary | ICD-10-CM | POA: Diagnosis not present

## 2023-01-14 DIAGNOSIS — M25562 Pain in left knee: Secondary | ICD-10-CM | POA: Diagnosis not present

## 2023-01-14 DIAGNOSIS — G8929 Other chronic pain: Secondary | ICD-10-CM | POA: Diagnosis not present

## 2023-01-14 DIAGNOSIS — M174 Other bilateral secondary osteoarthritis of knee: Secondary | ICD-10-CM | POA: Diagnosis not present

## 2023-01-16 ENCOUNTER — Other Ambulatory Visit: Payer: Self-pay | Admitting: Nurse Practitioner

## 2023-01-16 ENCOUNTER — Other Ambulatory Visit: Payer: Self-pay | Admitting: Orthopedic Surgery

## 2023-01-16 DIAGNOSIS — J3089 Other allergic rhinitis: Secondary | ICD-10-CM | POA: Diagnosis not present

## 2023-01-16 DIAGNOSIS — J3081 Allergic rhinitis due to animal (cat) (dog) hair and dander: Secondary | ICD-10-CM | POA: Diagnosis not present

## 2023-01-16 DIAGNOSIS — J301 Allergic rhinitis due to pollen: Secondary | ICD-10-CM | POA: Diagnosis not present

## 2023-01-20 ENCOUNTER — Encounter
Admission: RE | Admit: 2023-01-20 | Discharge: 2023-01-20 | Disposition: A | Discharge status: Home / Self Care | Payer: 59 | Source: Ambulatory Visit | Attending: Orthopedic Surgery | Admitting: Orthopedic Surgery

## 2023-01-20 ENCOUNTER — Ambulatory Visit (INDEPENDENT_AMBULATORY_CARE_PROVIDER_SITE_OTHER): Payer: 59 | Admitting: Internal Medicine

## 2023-01-20 ENCOUNTER — Encounter: Payer: Self-pay | Admitting: Nurse Practitioner

## 2023-01-20 ENCOUNTER — Other Ambulatory Visit: Payer: Self-pay

## 2023-01-20 VITALS — BP 145/75 | HR 64 | Resp 14 | Ht <= 58 in | Wt 168.0 lb

## 2023-01-20 VITALS — BP 148/77 | HR 95 | Temp 98.5°F | Resp 16 | Ht 59.0 in | Wt 168.6 lb

## 2023-01-20 DIAGNOSIS — Z01818 Encounter for other preprocedural examination: Secondary | ICD-10-CM

## 2023-01-20 DIAGNOSIS — M1712 Unilateral primary osteoarthritis, left knee: Secondary | ICD-10-CM | POA: Diagnosis not present

## 2023-01-20 DIAGNOSIS — I10 Essential (primary) hypertension: Secondary | ICD-10-CM

## 2023-01-20 LAB — URINALYSIS, ROUTINE W REFLEX MICROSCOPIC
Bilirubin Urine: NEGATIVE
Glucose, UA: NEGATIVE mg/dL
Hgb urine dipstick: NEGATIVE
Ketones, ur: NEGATIVE mg/dL
Nitrite: NEGATIVE
Protein, ur: NEGATIVE mg/dL
Specific Gravity, Urine: 1.024 (ref 1.005–1.030)
pH: 5 (ref 5.0–8.0)

## 2023-01-20 LAB — COMPREHENSIVE METABOLIC PANEL
ALT: 24 U/L (ref 0–44)
AST: 33 U/L (ref 15–41)
Albumin: 3.8 g/dL (ref 3.5–5.0)
Alkaline Phosphatase: 107 U/L (ref 38–126)
Anion gap: 12 (ref 5–15)
BUN: 23 mg/dL (ref 8–23)
CO2: 20 mmol/L — ABNORMAL LOW (ref 22–32)
Calcium: 9.1 mg/dL (ref 8.9–10.3)
Chloride: 109 mmol/L (ref 98–111)
Creatinine, Ser: 0.85 mg/dL (ref 0.44–1.00)
GFR, Estimated: >60 mL/min (ref >60)
Glucose, Bld: 96 mg/dL (ref 70–99)
Potassium: 3.7 mmol/L (ref 3.5–5.1)
Sodium: 141 mmol/L (ref 135–145)
Total Bilirubin: 0.7 mg/dL (ref 0.3–1.2)
Total Protein: 8 g/dL (ref 6.5–8.1)

## 2023-01-20 LAB — CBC WITH DIFFERENTIAL/PLATELET
Abs Immature Granulocytes: 0.01 10*3/uL (ref 0.00–0.07)
Basophils Absolute: 0.1 10*3/uL (ref 0.0–0.1)
Basophils Relative: 1 %
Eosinophils Absolute: 0.2 10*3/uL (ref 0.0–0.5)
Eosinophils Relative: 5 %
HCT: 38.7 % (ref 36.0–46.0)
Hemoglobin: 13 g/dL (ref 12.0–15.0)
Immature Granulocytes: 0 %
Lymphocytes Relative: 31 %
Lymphs Abs: 1.6 10*3/uL (ref 0.7–4.0)
MCH: 30 pg (ref 26.0–34.0)
MCHC: 33.6 g/dL (ref 30.0–36.0)
MCV: 89.2 fL (ref 80.0–100.0)
Monocytes Absolute: 0.4 10*3/uL (ref 0.1–1.0)
Monocytes Relative: 8 %
Neutro Abs: 2.9 10*3/uL (ref 1.7–7.7)
Neutrophils Relative %: 55 %
Platelets: 152 10*3/uL (ref 150–400)
RBC: 4.34 MIL/uL (ref 3.87–5.11)
RDW: 13.7 % (ref 11.5–15.5)
WBC: 5.2 10*3/uL (ref 4.0–10.5)
nRBC: 0 % (ref 0.0–0.2)

## 2023-01-20 LAB — SURGICAL PCR SCREEN
MRSA, PCR: NEGATIVE
Staphylococcus aureus: NEGATIVE

## 2023-01-20 LAB — TYPE AND SCREEN
ABO/RH(D): O POS
Antibody Screen: NEGATIVE

## 2023-01-20 NOTE — Patient Instructions (Signed)
Your procedure is scheduled on: Monday 01/26/23 To find out your arrival time, please call [phone] between 1PM - 3PM on:   Friday 01/23/23 Report to the Registration Desk on the 1st floor of the [facility]. Valet parking is available.  If your arrival time is 6:00 am, do not arrive before that time as the [facility] entrance doors do not open until 6:00 am.  REMEMBER: Instructions that are not followed completely may result in serious medical risk, up to and including death; or upon the discretion of your surgeon and anesthesiologist your surgery may need to be rescheduled.  Do not eat food after midnight the night before surgery.  No gum chewing or hard candies.  You may however, drink CLEAR liquids up to 2 hours before you are scheduled to arrive for your surgery. Do not drink anything within 2 hours of your scheduled arrival time.  Clear liquids include: - water  - apple juice without pulp - gatorade (not RED colors) - black coffee or tea (Do NOT add milk or creamers to the coffee or tea) Do NOT drink anything that is not on this list.  Type 1 and Type 2 diabetics should only drink water.  In addition, your doctor has ordered for you to drink the provided:  Ensure Pre-Surgery Clear Carbohydrate Drink  Drinking this carbohydrate drink up to two hours before surgery helps to reduce insulin resistance and improve patient outcomes. Please complete drinking 2 hours before scheduled arrival time.  One week prior to surgery: Stop Anti-inflammatories (NSAIDS) such as Advil, Aleve, Ibuprofen, Motrin, Naproxen, Naprosyn and Aspirin based products such as Excedrin, Goody's Powder, BC Powder. You may however, continue to take Tylenol if needed for pain up until the day of surgery.  Stop ANY OVER THE COUNTER supplements or vitamins until after surgery.  Continue taking all prescribed medications.   Follow recommendations from Cardiologist or PCP regarding stopping blood  thinners.Stop your aspirin today.  TAKE ONLY THESE MEDICATIONS THE MORNING OF SURGERY WITH A SIP OF WATER:  cetirizine (ZYRTEC) 10 MG tablet  levothyroxine (SYNTHROID) 50 MCG tablet  metoprolol tartrate (LOPRESSOR) 25 MG tablet  valACYclovir (VALTREX) 1000 MG tablet   No Alcohol for 24 hours before or after surgery.  No Smoking including e-cigarettes for 24 hours before surgery.  No chewable tobacco products for at least 6 hours before surgery.  No nicotine patches on the day of surgery.  Do not use any "recreational" drugs for at least a week (preferably 2 weeks) before your surgery.  Please be advised that the combination of cocaine and anesthesia may have negative outcomes, up to and including death. If you test positive for cocaine, your surgery will be cancelled.  On the morning of surgery brush your teeth with toothpaste and water, you may rinse your mouth with mouthwash if you wish. Do not swallow any toothpaste or mouthwash.  Use CHG Soap or wipes as directed on instruction sheet. Use for 5 days including the day of surgery starting on Thursday 01/22/23  Do not wear lotions, powders, or perfumes on the day of surgery.  Do not shave body hair from the neck down 48 hours before surgery.  Wear comfortable clothing (specific to your surgery type) to the hospital.  Do not wear jewelry, make-up, hairpins, clips or nail polish.  Contact lenses, hearing aids and dentures may not be worn into surgery.  Bring your C-PAP to the hospital in case you may have to spend the night.  Do not bring valuables to the hospital. [facility] is not responsible for any missing/lost belongings or valuables.   Notify your doctor if there is any change in your medical condition (cold, fever, infection).  If you are being discharged the day of surgery, you will not be allowed to drive home. You will need a responsible individual to drive you home and stay with you for 24 hours after surgery.    If you are taking public transportation, you will need to have a responsible individual with you.  If you are being admitted to the hospital overnight, leave your suitcase in the car. After surgery it may be brought to your room.  In case of increased patient census, it may be necessary for you, the patient, to continue your postoperative care in the Same Day Surgery department.  After surgery, you can help prevent lung complications by doing breathing exercises.  Take deep breaths and cough every 1-2 hours. Your doctor may order a device called an Incentive Spirometer to help you take deep breaths. When coughing or sneezing, hold a pillow firmly against your incision with both hands. This is called "splinting." Doing this helps protect your incision. It also decreases belly discomfort.  Surgery Visitation Policy:  Patients undergoing a surgery or procedure may have two family members or support persons with them as long as the person is not COVID-19 positive or experiencing its symptoms.   Inpatient Visitation:    Visiting hours are 7 a.m. to 8 p.m. Up to four visitors are allowed at one time in a patient room. The visitors may rotate out with other people during the day. One designated support person (adult) may remain overnight.  Please call the Pre-admissions Testing Dept. at [phone] if you have any questions about these instructions.    How to Use an Incentive Spirometer  An incentive spirometer is a tool that measures how well you are filling your lungs with each breath. Learning to take long, deep breaths using this tool can help you keep your lungs clear and active. This may help to reverse or lessen your chance of developing breathing (pulmonary) problems, especially infection. You may be asked to use a spirometer: After a surgery. If you have a lung problem or a history of smoking. After a long period of time when you have been unable to move or be active. If the  spirometer includes an indicator to show the highest number that you have reached, your health care provider or respiratory therapist will help you set a goal. Keep a log of your progress as told by your health care provider. What are the risks? Breathing too quickly may cause dizziness or cause you to pass out. Take your time so you do not get dizzy or light-headed. If you are in pain, you may need to take pain medicine before doing incentive spirometry. It is harder to take a deep breath if you are having pain. How to use your incentive spirometer  Sit up on the edge of your bed or on a chair. Hold the incentive spirometer so that it is in an upright position. Before you use the spirometer, breathe out normally. Place the mouthpiece in your mouth. Make sure your lips are closed tightly around it. Breathe in slowly and as deeply as you can through your mouth, causing the piston or the ball to rise toward the top of the chamber. Hold your breath for 3-5 seconds, or for as long as possible. If the  spirometer includes a coach indicator, use this to guide you in breathing. Slow down your breathing if the indicator goes above the marked areas. Remove the mouthpiece from your mouth and breathe out normally. The piston or ball will return to the bottom of the chamber. Rest for a few seconds, then repeat the steps 10 or more times. Take your time and take a few normal breaths between deep breaths so that you do not get dizzy or light-headed. Do this every 1-2 hours when you are awake. If the spirometer includes a goal marker to show the highest number you have reached (best effort), use this as a goal to work toward during each repetition. After each set of 10 deep breaths, cough a few times. This will help to make sure that your lungs are clear. If you have an incision on your chest or abdomen from surgery, place a pillow or a rolled-up towel firmly against the incision when you cough. This can help to  reduce pain while taking deep breaths and coughing. General tips When you are able to get out of bed: Walk around often. Continue to take deep breaths and cough in order to clear your lungs. Keep using the incentive spirometer until your health care provider says it is okay to stop using it. If you have been in the hospital, you may be told to keep using the spirometer at home. Contact a health care provider if: You are having difficulty using the spirometer. You have trouble using the spirometer as often as instructed. Your pain medicine is not giving enough relief for you to use the spirometer as told. You have a fever. Get help right away if: You develop shortness of breath. You develop a cough with bloody mucus from the lungs. You have fluid or blood coming from an incision site after you cough. Summary An incentive spirometer is a tool that can help you learn to take long, deep breaths to keep your lungs clear and active. You may be asked to use a spirometer after a surgery, if you have a lung problem or a history of smoking, or if you have been inactive for a long period of time. Use your incentive spirometer as instructed every 1-2 hours while you are awake. If you have an incision on your chest or abdomen, place a pillow or a rolled-up towel firmly against your incision when you cough. This will help to reduce pain. Get help right away if you have shortness of breath, you cough up bloody mucus, or blood comes from your incision when you cough. This information is not intended to replace advice given to you by your health care provider. Make sure you discuss any questions you have with your health care provider. Document Revised: 12/12/2019 Document Reviewed: 12/12/2019 Elsevier Patient Education  2023 [employer] Inc[employer]    Preoperative Educational Videos for Total Hip, Knee and Shoulder Replacements  To better prepare for surgery, please view our videos that explain the physical  activity and discharge planning required to have the best surgical recovery at [facility].  [email or web address]  Questions? Call [phone] or email [email or web address]

## 2023-01-20 NOTE — Progress Notes (Signed)
[facility] [address] [place], [place] [address]  Internal MEDICINE  Office Visit Note  Patient Name: Claudia Martin  [id number]  [id number]  Date of Service: 01/20/2023  Chief Complaint  Patient presents with   Follow-up    Clearance form from [facility].     HPI The patient is seen for medical clearance for left total knee arthroplasty She has been having problem walking with ADLs pain and imbalance Blood pressure is slightly elevated today denies any chest pain shortness of breath fever or chills No history of active cardiac and pulmonary disease Patient is also on Crestor Patient is on metoprolol and losartan both low-dose Pt is suppose to have an EKG     Current Medication: Outpatient Encounter Medications as of 01/20/2023  Medication Sig   acetaminophen (TYLENOL) 325 MG tablet Take 650 mg by mouth every 6 (six) hours as needed.   acyclovir ointment (ZOVIRAX) 5 % Apply topically.   aspirin 81 MG chewable tablet Chew 1 tablet (81 mg total) by mouth daily.   cetirizine (ZYRTEC) 10 MG tablet Take 10 mg by mouth daily.   chlorhexidine (PERIDEX) 0.12 % solution USE 5 ML AS DIRECTED IN THE MOUTH OR THROAT TWICE DAILY (Patient taking differently: Use as directed 5 mLs in the mouth or throat 2 (two) times daily as needed. USE 5 ML AS DIRECTED IN THE MOUTH OR THROAT TWICE DAILY)   EPINEPHrine 0.3 mg/0.3 mL IJ SOAJ injection as needed.   levothyroxine (SYNTHROID) 50 MCG tablet TAKE 1 TABLET(50 MCG) BY MOUTH DAILY BEFORE AND BREAKFAST   losartan (COZAAR) 25 MG tablet Take 1 tablet by mouth daily.   metoprolol tartrate (LOPRESSOR) 25 MG tablet Take 1 tablet (25 mg total) by mouth 2 (two) times daily.   nitroGLYCERIN (NITROSTAT) 0.4 MG SL tablet Place 1 tablet (0.4 mg total) under the tongue every 5 (five) minutes x 3 doses as needed for chest pain.   rosuvastatin (CRESTOR) 5 MG tablet Take 5 mg by mouth at bedtime.   valACYclovir (VALTREX) 1000 MG tablet TAKE 1 TABLET  BY MOUTH DAILY. CAN TAKE 1 TABLET BY MOUTH TWICE DAILY IF FLARE UP FOR 7 DAYS   No facility-administered encounter medications on file as of 01/20/2023.    Surgical History: Past Surgical History:  Procedure Laterality Date   ABDOMINAL WALL DEFECT REPAIR N/A 05/27/2022   Procedure: REPAIR ABDOMINAL WALL, reconstruction;  Surgeon: Leafy Ro, MD;  Location: [facility] ORS;  Service: General;  Laterality: N/A;   BREAST BIOPSY Bilateral    benign   CATARACT EXTRACTION W/PHACO Right 06/05/2021   Procedure: CATARACT EXTRACTION PHACO AND INTRAOCULAR LENS PLACEMENT (IOC) RIGHT;  Surgeon: Lockie Mola, MD;  Location: [facility];  Service: Ophthalmology;  Laterality: Right;  3.02 00:39.5   CATARACT EXTRACTION W/PHACO Left 12/04/2021   Procedure: CATARACT EXTRACTION PHACO AND INTRAOCULAR LENS PLACEMENT (IOC) LEFT;  Surgeon: Lockie Mola, MD;  Location: [facility];  Service: Ophthalmology;  Laterality: Left;  6.23 00:48.0   COLON SURGERY     COLONOSCOPY WITH PROPOFOL N/A 03/15/2018   Procedure: COLONOSCOPY WITH PROPOFOL;  Surgeon: Toney Reil, MD;  Location: [facility] ENDOSCOPY;  Service: Gastroenterology;  Laterality: N/A;   COLONOSCOPY WITH PROPOFOL N/A 01/09/2021   Procedure: COLONOSCOPY WITH PROPOFOL;  Surgeon: Toney Reil, MD;  Location: [facility] ENDOSCOPY;  Service: Gastroenterology;  Laterality: N/A;   CORONARY/GRAFT ACUTE MI REVASCULARIZATION N/A 06/06/2021   Procedure: Coronary/Graft Acute MI Revascularization;  Surgeon: Alwyn Pea, MD;  Location:  [facility] INVASIVE CV LAB;  Service: Cardiovascular;  Laterality: N/A;   ECTOPIC PREGNANCY SURGERY     fx thumb     HERNIA REPAIR     INSERTION OF MESH N/A 05/27/2022   Procedure: INSERTION OF MESH;  Surgeon: Leafy Ro, MD;  Location: [facility] ORS;  Service: General;  Laterality: N/A;   LEFT HEART CATH AND CORONARY ANGIOGRAPHY N/A 06/06/2021   Procedure: LEFT HEART CATH AND CORONARY ANGIOGRAPHY;   Surgeon: Alwyn Pea, MD;  Location: [facility] INVASIVE CV LAB;  Service: Cardiovascular;  Laterality: N/A;   VENTRAL HERNIA REPAIR N/A 05/27/2022   Procedure: HERNIA REPAIR VENTRAL ADULT, recurrent;  Surgeon: Leafy Ro, MD;  Location: [facility] ORS;  Service: General;  Laterality: N/A;    Medical History: Past Medical History:  Diagnosis Date   Allergic rhinitis    Aortic atherosclerosis    Arthritis    CAD (coronary artery disease) 06/06/2021   a.) STEMI --> LHC: EF 55-65%, 100% RI --> PCI performed placing a 2.5 x 18 mm Onxy Frontier DES   Cellulitis and abscess    Diastolic dysfunction 06/07/2021   a.) TTE 06/07/2021: EF 55-60%, no RWMAs, triv MR, mild AoV sclerosis with no stenosis, G1DD.   Diverticulosis    Enlarged heart    Eyelid inflammation    GERD (gastroesophageal reflux disease)    Hematuria syndrome    Hemorrhoids    Herpes genitalis    Herpes simplex    HTN (hypertension)    Hydronephrosis    Hypothyroidism    Incomplete bladder emptying    Internal hordeolum    Long term current use of antithrombotics/antiplatelets    a.) on daily DAPT therapy (ASA + ticagrelor)   OSA on CPAP    Primary ovarian failure    Sciatica    Shortness of breath    Sinus bradycardia    STEMI (ST elevation myocardial infarction) 06/06/2021   a.) LHC/PCI --> 100% RI (2.5 x 18 m Onyx Frontier DES)   Urethral diverticulum    Vaginal atrophy     Family History: Family History  Problem Relation Age of Onset   Tuberculosis Mother    Nephrolithiasis Brother    Vision loss Daughter    Hypertension Daughter     Social History   Socioeconomic History   Marital status: Divorced    Spouse name: Not on file   Number of children: Not on file   Years of education: Not on file   Highest education level: Not on file  Occupational History   Not on file  Tobacco Use   Smoking status: Never    Passive exposure: Never   Smokeless tobacco: Never  Vaping Use   Vaping Use: Never used   Substance and Sexual Activity   Alcohol use: No   Drug use: Never   Sexual activity: Not on file  Other Topics Concern   Not on file  Social History Narrative   Not on file   Social Determinants of Health   Financial Resource Strain: Not on file  Food Insecurity: Not on file  Transportation Needs: Not on file  Physical Activity: Not on file  Stress: Not on file  Social Connections: Not on file  Intimate Partner Violence: Not on file      Review of Systems  Constitutional:  Negative for chills, fatigue and unexpected weight change.  HENT:  Positive for postnasal drip. Negative for congestion, rhinorrhea, sneezing and sore throat.   Eyes:  Negative for redness.  Respiratory:  Negative for cough, chest tightness and shortness of breath.   Cardiovascular:  Negative for chest pain and palpitations.  Gastrointestinal:  Negative for abdominal pain, constipation, diarrhea, nausea and vomiting.  Genitourinary:  Negative for dysuria and frequency.  Musculoskeletal:  Positive for gait problem. Negative for arthralgias, back pain, joint swelling and neck pain.  Skin:  Negative for rash.  Neurological:  Negative for tremors and numbness.  Hematological:  Negative for adenopathy. Does not bruise/bleed easily.  Psychiatric/Behavioral:  Negative for behavioral problems (Depression), sleep disturbance and suicidal ideas. The patient is not nervous/anxious.     Vital Signs: BP (!) 148/77   Pulse 95   Temp 98.5 F (36.9 C)   Resp 16   Ht  (1.499 m)   Wt 168 lb 9.6 oz (76.5 kg)   SpO2 97%   BMI 34.05 kg/m    Physical Exam Constitutional:      Appearance: Normal appearance.  HENT:     Head: Normocephalic and atraumatic.     Nose: Nose normal.     Mouth/Throat:     Mouth: Mucous membranes are moist.     Pharynx: No posterior oropharyngeal erythema.  Eyes:     Extraocular Movements: Extraocular movements intact.     Pupils: Pupils are equal, round, and reactive to light.   Cardiovascular:     Pulses: Normal pulses.     Heart sounds: Normal heart sounds.  Pulmonary:     Effort: Pulmonary effort is normal.     Breath sounds: Normal breath sounds.  Neurological:     General: No focal deficit present.     Mental Status: She is alert.  Psychiatric:        Mood and Affect: Mood normal.        Behavior: Behavior normal.       Assessment/Plan: 1. Benign hypertension BP is slightly elevated, will increase Losartan to 50 mg since her home readings are high as well, continue Metoprolol as before   2. Primary osteoarthritis of left knee Pt will get better mobility with total knee arthoplasty   3. Preoperative evaluation of a medical condition to rule out surgical contraindications  Low risk for any CV events    General Counseling: Lynnell Grain understanding of the findings of todays visit and agrees with plan of treatment. I have discussed any further diagnostic evaluation that may be needed or ordered today. We also reviewed her medications today. she has been encouraged to call the office with any questions or concerns that should arise related to todays visit.    Total time spent:40 Minutes Time spent includes review of chart, medications, test results, and follow up plan with the patient.   [place] Controlled Substance Database was reviewed by me.   Dr Lyndon Code Internal medicine

## 2023-01-21 ENCOUNTER — Encounter: Payer: Self-pay | Admitting: Orthopedic Surgery

## 2023-01-21 NOTE — Progress Notes (Signed)
Perioperative Services  Pre-Admission/Anesthesia Testing Clinical Review  Date: 01/23/23  Patient Demographics:  Name: Claudia Martin DOB:   Jul 27, 1946 MRN:   [id number]  Planned Surgical Procedure(s):    Case: [id number] Date/Time: 01/26/23 0715   Procedure: TOTAL KNEE ARTHROPLASTY (Left: Knee)   Anesthesia type: Choice   Pre-op diagnosis:      Other bilateral secondary osteoarthritis of knee M17.4     Chronic pain of both knees M25.561, M25.562, G89.29   Location: [facility] OR ROOM 02 / [facility]   Surgeons: Reinaldo Berber, MD   NOTE: Available PAT nursing documentation and vital signs have been reviewed. Clinical nursing staff has updated patient's PMH/PSHx, current medication list, and drug allergies/intolerances to ensure comprehensive history available to assist in medical decision making as it pertains to the aforementioned surgical procedure and anticipated anesthetic course. Extensive review of available clinical information performed. [place] PMH and PSHx updated with any diagnoses/procedures that  may have been inadvertently omitted during her intake with the pre-admission testing department's nursing staff.  Clinical Discussion:  Claudia Martin is a 77 y.o. female who is submitted for pre-surgical anesthesia review and clearance prior to her undergoing the above procedure. Patient has never been a smoker. Pertinent PMH includes: CAD, STEMI, diastolic dysfunction, cardiomegaly, aortic atherosclerosis, HTN, HLD, hypothyroidism, dyspnea, OSAH (requires nocturnal PAP therapy), GERD (no daily Tx), ventral hernia, OA.  Patient is followed by cardiology Juliann Pares, MD). She was last seen in the cardiology clinic on 01/07/2023; notes reviewed. At the time of her clinic visit, patient doing well overall from a cardiovascular perspective. Patient reports occasional "twinges" of chest discomfort that were reported to be minor and self limiting. Patient  denied any chest pain, shortness of breath, PND, orthopnea, palpitations, significant peripheral edema, weakness, fatigue, vertiginous symptoms, or presyncope/syncope. Patient with a past medical history significant for cardiovascular diagnoses. Documented physical exam was grossly benign, providing no evidence of acute exacerbation and/or decompensation of the patient's known cardiovascular conditions.  Patient suffered a STEMI on 06/06/2021.  Diagnostic left heart catheterization at that time revealed a normal left ventricular systolic function with an EF of 55 to 65%.  There was 100% occlusion of the ramus intermedius.  PCI subsequently performed placing a 2.5 x 18 mm Onyx Frontier DES yielding excellent angiographic result and TIMI-3 flow.  TTE performed on 06/07/2021 revealed a normal left ventricular systolic function with EF 55 to 60%.  There were no regional wall motion abnormalities. Diastolic Doppler parameters consistent with abnormal relaxation (G1DD).  There was trivial mitral valve regurgitation noted.  Mild aortic valve sclerosis present with no evidence of stenosis.  Following STEMI with stent placement, patient was treated with daily DAPT therapy (ASA + ticagrelor) x 1 year. Ticagrelor has since been discontinued and patient remains on a daily low dose ASA. Blood pressure well controlled at 128/80 mmHg on currently prescribed ARB (losartan) and beta-blocker (metoprolol tartrate) therapies.  Patient is on rosuvastatin for her HLD diagnosis and further ASCVD prevention.  She has a supply of short acting nitrates (NTG) to use on a as needed basis; denied recent use.  Patient is not diabetic. She does have an OSAH diagnosis and is reported to be compliant with prescribed nocturnal PAP therapy. Functional capacity, as defined by DASI, is documented as being >/= 4 METS.  No changes were made to his medication regimen.  Patient to follow-up with outpatient cardiology and 6 months or sooner if  needed.  Claudia Martin is  scheduled for an elective TOTAL KNEE ARTHROPLASTY (Left: Knee) on 01/26/2023 with Dr. Reinaldo Berber, MD. Given patient's past medical history significant for cardiovascular diagnoses, presurgical cardiac clearance was sought by the performing surgeon's office and PAT team. Per cardiology, "this patient is optimized for surgery and may proceed with the planned procedural course with a LOW risk of significant perioperative cardiovascular complications".  In review of her medical reconciliation, it is noted that patient is on daily antithrombotic therapy. She has been instructed on recommendations from her cardiologist for holding her daily low doses ASA for 7 days prior to procedure with plans to restart as soon as postoperative bleeding is felt to be minimized by her primary attending surgeon. The patient is aware that her last dose of ASA should be on 01/18/2023.   Patient denies previous perioperative complications with anesthesia in the past. In review of the available records, it is noted that patient underwent a general anesthetic course here at [facility] (ASA III) in 05/2022 without documented complications.      01/20/2023    3:18 PM 01/20/2023    9:04 AM 12/12/2022   11:23 AM  Vitals with BMI  Height 4\' 11"  4\' 9"  4\' 11"   Weight 168 lbs 10 oz 168 lbs 169 lbs  BMI 34.03 36.34 34.12  Systolic 148 145 914  Diastolic 77 75 69  Pulse 95 64 71    Providers/Specialists:   NOTE: Primary physician provider listed below. Patient may have been seen by APP or partner within same practice.   PROVIDER ROLE / SPECIALTY LAST Wilber Bihari, MD Orthopedics (Surgeon) 01/14/2023  Sallyanne Kuster, NP Primary Care Provider 01/20/2023  Rudean Hitt, MD Cardiology 01/07/2023  Freda Munro, MD Pulmonary/Sleep Medicine 12/12/2022   Allergies:  Other, Augmentin [amoxicillin-pot clavulanate], Bactrim  [sulfamethoxazole-trimethoprim], Doxycycline, Ibuprofen, Iodinated contrast media, Macrobid [nitrofurantoin], Prednisone, and Sulfa antibiotics  Current Home Medications:   No current facility-administered medications for this encounter.    acetaminophen (TYLENOL) 325 MG tablet   acyclovir ointment (ZOVIRAX) 5 %   aspirin 81 MG chewable tablet   cetirizine (ZYRTEC) 10 MG tablet   chlorhexidine (PERIDEX) 0.12 % solution   EPINEPHrine 0.3 mg/0.3 mL IJ SOAJ injection   levothyroxine (SYNTHROID) 50 MCG tablet   losartan (COZAAR) 25 MG tablet   metoprolol tartrate (LOPRESSOR) 25 MG tablet   nitroGLYCERIN (NITROSTAT) 0.4 MG SL tablet   rosuvastatin (CRESTOR) 5 MG tablet   valACYclovir (VALTREX) 1000 MG tablet   History:   Past Medical History:  Diagnosis Date   Allergic rhinitis    Aortic atherosclerosis    Arthritis    CAD (coronary artery disease) 06/06/2021   a.) STEMI --> LHC: EF 55-65%, 100% RI --> PCI performed placing a 2.5 x 18 mm Onyx Frontier DES   Cellulitis and abscess    Diastolic dysfunction 06/07/2021   a.) TTE 06/07/2021: EF 55-60%, no RWMAs, triv MR, mild AoV sclerosis with no stenosis, G1DD; b.) TTE 07/17/2021: EF >55%, mild LVH, no RWMAs; triv TR, mild MR, G1DD   Diverticulosis    Enlarged heart    Eyelid inflammation    GERD (gastroesophageal reflux disease)    Hematuria syndrome    Hemorrhoids    Herpes genitalis    Herpes simplex    HTN (hypertension)    Hydronephrosis    Hypothyroidism    Incomplete bladder emptying    Internal hordeolum    Long term current use of antithrombotics/antiplatelets    a.) on  daily DAPT therapy (ASA + ticagrelor) x 1 year s/p STEMI; ticagrelor has been discontinued   OSA on CPAP    Primary ovarian failure    Sciatica    Shortness of breath    Sinus bradycardia    STEMI (ST elevation myocardial infarction) 06/06/2021   a.) LHC/PCI 06/06/2021 --> 100% RI (2.5 x 18 mm Onyx Frontier DES)   Urethral diverticulum    Vaginal  atrophy    Past Surgical History:  Procedure Laterality Date   ABDOMINAL WALL DEFECT REPAIR N/A 05/27/2022   Procedure: REPAIR ABDOMINAL WALL, reconstruction;  Surgeon: Leafy Ro, MD;  Location: [facility] ORS;  Service: General;  Laterality: N/A;   BREAST BIOPSY Bilateral    benign   CATARACT EXTRACTION W/PHACO Right 06/05/2021   Procedure: CATARACT EXTRACTION PHACO AND INTRAOCULAR LENS PLACEMENT (IOC) RIGHT;  Surgeon: Lockie Mola, MD;  Location: [facility];  Service: Ophthalmology;  Laterality: Right;  3.02 00:39.5   CATARACT EXTRACTION W/PHACO Left 12/04/2021   Procedure: CATARACT EXTRACTION PHACO AND INTRAOCULAR LENS PLACEMENT (IOC) LEFT;  Surgeon: Lockie Mola, MD;  Location: [facility];  Service: Ophthalmology;  Laterality: Left;  6.23 00:48.0   COLON SURGERY     COLONOSCOPY WITH PROPOFOL N/A 03/15/2018   Procedure: COLONOSCOPY WITH PROPOFOL;  Surgeon: Toney Reil, MD;  Location: [facility] ENDOSCOPY;  Service: Gastroenterology;  Laterality: N/A;   COLONOSCOPY WITH PROPOFOL N/A 01/09/2021   Procedure: COLONOSCOPY WITH PROPOFOL;  Surgeon: Toney Reil, MD;  Location: [facility] ENDOSCOPY;  Service: Gastroenterology;  Laterality: N/A;   CORONARY/GRAFT ACUTE MI REVASCULARIZATION N/A 06/06/2021   Procedure: Coronary/Graft Acute MI Revascularization;  Surgeon: Alwyn Pea, MD;  Location: [facility] INVASIVE CV LAB;  Service: Cardiovascular;  Laterality: N/A;   ECTOPIC PREGNANCY SURGERY     fx thumb     HERNIA REPAIR     INSERTION OF MESH N/A 05/27/2022   Procedure: INSERTION OF MESH;  Surgeon: Leafy Ro, MD;  Location: [facility] ORS;  Service: General;  Laterality: N/A;   LEFT HEART CATH AND CORONARY ANGIOGRAPHY N/A 06/06/2021   Procedure: LEFT HEART CATH AND CORONARY ANGIOGRAPHY;  Surgeon: Alwyn Pea, MD;  Location: [facility] INVASIVE CV LAB;  Service: Cardiovascular;  Laterality: N/A;   VENTRAL HERNIA REPAIR N/A 05/27/2022   Procedure: HERNIA  REPAIR VENTRAL ADULT, recurrent;  Surgeon: Leafy Ro, MD;  Location: [facility] ORS;  Service: General;  Laterality: N/A;   Family History  Problem Relation Age of Onset   Tuberculosis Mother    Nephrolithiasis Brother    Vision loss Daughter    Hypertension Daughter    Social History   Tobacco Use   Smoking status: Never    Passive exposure: Never   Smokeless tobacco: Never  Vaping Use   Vaping Use: Never used  Substance Use Topics   Alcohol use: No   Drug use: Never    Pertinent Clinical Results:  LABS: Labs reviewed: Acceptable for surgery.  No visits with results within 3 Day(s) from this visit.  Latest known visit with results is:  Hospital Outpatient Visit on 01/20/2023  Component Date Value Ref Range Status   WBC 01/20/2023 5.2  4.0 - 10.5 K/uL Final   RBC 01/20/2023 4.34  3.87 - 5.11 MIL/uL Final   Hemoglobin 01/20/2023 13.0  12.0 - 15.0 g/dL Final   HCT 16/07/9603 38.7  36.0 - 46.0 % Final   MCV 01/20/2023 89.2  80.0 - 100.0 fL Final   MCH 01/20/2023 30.0  26.0 -  34.0 pg Final   MCHC 01/20/2023 33.6  30.0 - 36.0 g/dL Final   RDW 16/07/9603 13.7  11.5 - 15.5 % Final   Platelets 01/20/2023 152  150 - 400 K/uL Final   nRBC 01/20/2023 0.0  0.0 - 0.2 % Final   Neutrophils Relative % 01/20/2023 55  % Final   Neutro Abs 01/20/2023 2.9  1.7 - 7.7 K/uL Final   Lymphocytes Relative 01/20/2023 31  % Final   Lymphs Abs 01/20/2023 1.6  0.7 - 4.0 K/uL Final   Monocytes Relative 01/20/2023 8  % Final   Monocytes Absolute 01/20/2023 0.4  0.1 - 1.0 K/uL Final   Eosinophils Relative 01/20/2023 5  % Final   Eosinophils Absolute 01/20/2023 0.2  0.0 - 0.5 K/uL Final   Basophils Relative 01/20/2023 1  % Final   Basophils Absolute 01/20/2023 0.1  0.0 - 0.1 K/uL Final   Immature Granulocytes 01/20/2023 0  % Final   Abs Immature Granulocytes 01/20/2023 0.01  0.00 - 0.07 K/uL Final   Performed at [facility], [address] [place], [place] [address]   Sodium 01/20/2023  141  135 - 145 mmol/L Final   Potassium 01/20/2023 3.7  3.5 - 5.1 mmol/L Final   Chloride 01/20/2023 109  98 - 111 mmol/L Final   CO2 01/20/2023 20 (L)  22 - 32 mmol/L Final   Glucose, Bld 01/20/2023 96  70 - 99 mg/dL Final   Glucose reference range applies only to samples taken after fasting for at least 8 hours.   BUN 01/20/2023 23  8 - 23 mg/dL Final   Creatinine, Ser 01/20/2023 0.85  0.44 - 1.00 mg/dL Final   Calcium 11/91/4782 9.1  8.9 - 10.3 mg/dL Final   Total Protein 95/62/1308 8.0  6.5 - 8.1 g/dL Final   Albumin 65/78/4696 3.8  3.5 - 5.0 g/dL Final   AST 29/52/8413 33  15 - 41 U/L Final   ALT 01/20/2023 24  0 - 44 U/L Final   Alkaline Phosphatase 01/20/2023 107  38 - 126 U/L Final   Total Bilirubin 01/20/2023 0.7  0.3 - 1.2 mg/dL Final   GFR, Estimated 01/20/2023 >60  >60 mL/min Final   Comment: (NOTE) Calculated using the CKD-EPI Creatinine Equation (2021)    Anion gap 01/20/2023 12  5 - 15 Final   Performed at [facility], [address] [place], [place] [address]   Color, Urine 01/20/2023 YELLOW (A)  YELLOW Final   APPearance 01/20/2023 HAZY (A)  CLEAR Final   Specific Gravity, Urine 01/20/2023 1.024  1.005 - 1.030 Final   pH 01/20/2023 5.0  5.0 - 8.0 Final   Glucose, UA 01/20/2023 NEGATIVE  NEGATIVE mg/dL Final   Hgb urine dipstick 01/20/2023 NEGATIVE  NEGATIVE Final   Bilirubin Urine 01/20/2023 NEGATIVE  NEGATIVE Final   Ketones, ur 01/20/2023 NEGATIVE  NEGATIVE mg/dL Final   Protein, ur 02/72/5366 NEGATIVE  NEGATIVE mg/dL Final   Nitrite 44/12/4740 NEGATIVE  NEGATIVE Final   Leukocytes,Ua 01/20/2023 TRACE (A)  NEGATIVE Final   RBC / HPF 01/20/2023 0-5  0 - 5 RBC/hpf Final   WBC, UA 01/20/2023 6-10  0 - 5 WBC/hpf Final   Bacteria, UA 01/20/2023 RARE (A)  NONE SEEN Final   Squamous Epithelial / HPF 01/20/2023 0-5  0 - 5 /HPF Final   Mucus 01/20/2023 PRESENT   Final   Hyaline Casts, UA 01/20/2023 PRESENT   Final   Performed at [facility], [address] [place], [place] [address]   ABO/RH(D) 01/20/2023 O POS   Final   Antibody Screen 01/20/2023 NEG   Final   Sample Expiration 01/20/2023 02/03/2023,2359   Final   Extend sample reason 01/20/2023    Final                   Value:NO TRANSFUSIONS OR PREGNANCY IN THE PAST 3 MONTHS Performed at [facility], [address] [place], [place] [address]    MRSA, PCR 01/20/2023 NEGATIVE  NEGATIVE Final   Staphylococcus aureus 01/20/2023 NEGATIVE  NEGATIVE Final   Comment: (NOTE) The Xpert SA Assay (FDA approved for NASAL specimens in patients 15 years of age and older), is one component of a comprehensive surveillance program. It is not intended to diagnose infection nor to guide or monitor treatment. Performed at [facility], [address] [place], [place] [address]     ECG: Date: 01/20/2023 Time ECG obtained: 1021 AM Rate: 55 bpm Rhythm: normal sinus; bifascicular block (RBBB + LAFB) Axis (leads I and aVF): Normal Intervals: PR 160 ms. QRS 122 ms. QTc 466 ms. ST segment and T wave changes: No evidence of acute ST segment elevation or depression.  Evidence of an age undetermined lateral infarct present. Comparison: Similar to previous tracing obtained on 05/19/2022   IMAGING / PROCEDURES: CT ABDOMEN PELVIS W CONTRAST performed on 04/28/2022 Broad-based midline upper abdominal ventral hernia containing a segment of transverse colon, with decreased inflammatory changes along the right lateral margin of the hernia sac. No fluid collection or abscess is identified. No incarceration or obstruction. 2 other midline ventral hernias containing portions of small bowel as above, unchanged since prior exam. No incarceration or obstruction. Aortic atherosclerosis Indeterminate partially calcified hypodense lesion within the pancreatic tail, compatible with benign etiology given stability since 2011.  TRANSTHORACIC ECHOCARDIOGRAM performed on 06/07/2021 Left  ventricular ejection fraction, by estimation, is 55 to 60%. The left ventricle has normal function. The left ventricle has no regional wall motion abnormalities. Left ventricular diastolic parameters are consistent with Grade I diastolic dysfunction (impaired relaxation).  Right ventricular systolic function is normal. The right ventricular size is normal.  The mitral valve is degenerative. Trivial mitral valve regurgitation. No evidence of mitral stenosis. The aortic valve is normal in structure. Aortic valve regurgitation is not visualized. Mild aortic valve sclerosis is present, with no evidence of aortic valve stenosis.  The inferior vena cava is normal in size with greater than 50% respiratory variability, suggesting right atrial pressure of 3 mmHg.  LEFT HEART CATHETERIZATION AND CORONARY ANGIOGRAPHY performed on 06/06/2021 Normal left ventricular systolic function with EF of 55 to 65% Mildly elevated LVEDP 1% lesion of the ramus intermedius Successful PCI placing a 2.5 x 18 mm Onyx Frontier DES yielding excellent angiographic result and TIMI-3 flow   Impression and Plan:  Claudia Martin has been referred for pre-anesthesia review and clearance prior to her undergoing the planned anesthetic and procedural courses. Available labs, pertinent testing, and imaging results were personally reviewed by me in preparation for upcoming operative/procedural course. [facility] medical record has been updated following extensive record review and patient interview with PAT staff.   This patient has been appropriately cleared by cardiology with an overall LOW risk of significant perioperative cardiovascular complications. Based on clinical review performed today (01/23/23), barring any significant acute changes in the patient's overall condition, it is anticipated that she will be able to proceed with the planned surgical intervention. Any acute changes in clinical  condition may necessitate her  procedure being postponed and/or cancelled. Patient will meet with anesthesia team (MD and/or CRNA) on the day of her procedure for preoperative evaluation/assessment. Questions regarding anesthetic course will be fielded at that time.   Pre-surgical instructions were reviewed with the patient during her PAT appointment, and questions were fielded to satisfaction by PAT clinical staff. She has been instructed on which medications that she will need to hold prior to surgery, as well as the ones that have been deemed safe/appropriate to take of the day of her procedure. As part of the general education provided by PAT, patient made aware both verbally and in writing, that @HE @ would need to abstain from the use of any illegal substances during her perioperative course. She was advised that failure to follow the provided instructions could necessitate case cancellation or result serious perioperative complications up to and including death. Patient encouraged to contact PAT and/or her surgeon's office to discuss any questions or concerns that may arise prior to surgery; verbalized understanding.   Quentin Mulling, MSN, APRN, FNP-C, CEN [facility]  Peri-operative Services Nurse Practitioner Phone: [phone] Fax: [phone] 01/23/23 10:13 AM  NOTE: This note has been prepared using Dragon dictation software. Despite my best ability to proofread, there is always the potential that unintentional transcriptional errors may still occur from this process.

## 2023-01-22 DIAGNOSIS — J301 Allergic rhinitis due to pollen: Secondary | ICD-10-CM | POA: Diagnosis not present

## 2023-01-22 DIAGNOSIS — J3089 Other allergic rhinitis: Secondary | ICD-10-CM | POA: Diagnosis not present

## 2023-01-22 DIAGNOSIS — J3081 Allergic rhinitis due to animal (cat) (dog) hair and dander: Secondary | ICD-10-CM | POA: Diagnosis not present

## 2023-01-26 ENCOUNTER — Observation Stay
Admission: RE | Admit: 2023-01-26 | Discharge: 2023-01-27 | Disposition: A | Discharge status: Home Health | Payer: 59 | Source: Ambulatory Visit | Attending: Orthopedic Surgery | Admitting: Orthopedic Surgery

## 2023-01-26 ENCOUNTER — Encounter: Payer: Self-pay | Admitting: Orthopedic Surgery

## 2023-01-26 ENCOUNTER — Encounter
Admission: RE | Disposition: A | Discharge status: Home Health | Payer: Self-pay | Source: Ambulatory Visit | Attending: Orthopedic Surgery

## 2023-01-26 ENCOUNTER — Other Ambulatory Visit: Payer: Self-pay

## 2023-01-26 ENCOUNTER — Ambulatory Visit: Payer: 59 | Admitting: Urgent Care

## 2023-01-26 ENCOUNTER — Ambulatory Visit: Payer: 59

## 2023-01-26 DIAGNOSIS — I2510 Atherosclerotic heart disease of native coronary artery without angina pectoris: Secondary | ICD-10-CM | POA: Diagnosis not present

## 2023-01-26 DIAGNOSIS — E039 Hypothyroidism, unspecified: Secondary | ICD-10-CM | POA: Diagnosis not present

## 2023-01-26 DIAGNOSIS — Z96652 Presence of left artificial knee joint: Secondary | ICD-10-CM | POA: Diagnosis not present

## 2023-01-26 DIAGNOSIS — M170 Bilateral primary osteoarthritis of knee: Principal | ICD-10-CM | POA: Insufficient documentation

## 2023-01-26 DIAGNOSIS — M1712 Unilateral primary osteoarthritis, left knee: Secondary | ICD-10-CM | POA: Diagnosis not present

## 2023-01-26 DIAGNOSIS — Z79899 Other long term (current) drug therapy: Secondary | ICD-10-CM | POA: Diagnosis not present

## 2023-01-26 DIAGNOSIS — I10 Essential (primary) hypertension: Secondary | ICD-10-CM | POA: Diagnosis not present

## 2023-01-26 DIAGNOSIS — Z7982 Long term (current) use of aspirin: Secondary | ICD-10-CM | POA: Diagnosis not present

## 2023-01-26 DIAGNOSIS — M174 Other bilateral secondary osteoarthritis of knee: Secondary | ICD-10-CM | POA: Diagnosis not present

## 2023-01-26 DIAGNOSIS — Z471 Aftercare following joint replacement surgery: Secondary | ICD-10-CM | POA: Diagnosis not present

## 2023-01-26 HISTORY — PX: TOTAL KNEE ARTHROPLASTY: SHX125

## 2023-01-26 LAB — ABO/RH: ABO/RH(D): O POS

## 2023-01-26 SURGERY — ARTHROPLASTY, KNEE, TOTAL
Anesthesia: Spinal | Site: Knee | Laterality: Left

## 2023-01-26 MED ORDER — OXYCODONE HCL 5 MG/5ML PO SOLN: 5.0000 mg | Freq: Once | ORAL | Status: DC | PRN

## 2023-01-26 MED ORDER — OXYCODONE HCL 5 MG PO TABS: 5.0000 mg | ORAL_TABLET | Freq: Once | ORAL | Status: DC | PRN

## 2023-01-26 MED ORDER — TRANEXAMIC ACID-NACL 1000-0.7 MG/100ML-% IV SOLN: 1000.0000 mg | INTRAVENOUS | Status: DC

## 2023-01-26 MED ORDER — EPINEPHRINE PF 1 MG/ML IJ SOLN
INTRAMUSCULAR | Status: AC
  Filled 2023-01-26: qty 1

## 2023-01-26 MED ORDER — SODIUM CHLORIDE (PF) 0.9 % IJ SOLN
INTRAMUSCULAR | Status: DC | PRN
  Administered 2023-01-26: 20 mL

## 2023-01-26 MED ORDER — KETOROLAC TROMETHAMINE 30 MG/ML IJ SOLN
INTRAMUSCULAR | Status: DC | PRN
  Administered 2023-01-26: 30 mg via INTRA_ARTICULAR

## 2023-01-26 MED ORDER — BUPIVACAINE LIPOSOME 1.3 % IJ SUSP
INTRAMUSCULAR | Status: AC
  Filled 2023-01-26: qty 20

## 2023-01-26 MED ORDER — SODIUM CHLORIDE 0.9 % IV SOLN: INTRAVENOUS | Status: DC

## 2023-01-26 MED ORDER — CEFAZOLIN SODIUM-DEXTROSE 2-4 GM/100ML-% IV SOLN
INTRAVENOUS | Status: AC
  Filled 2023-01-26: qty 100

## 2023-01-26 MED ORDER — ORAL CARE MOUTH RINSE: 15.0000 mL | Freq: Once | OROMUCOSAL | Status: AC

## 2023-01-26 MED ORDER — PHENYLEPHRINE 80 MCG/ML (10ML) SYRINGE FOR IV PUSH (FOR BLOOD PRESSURE SUPPORT)
PREFILLED_SYRINGE | INTRAVENOUS | Status: DC | PRN
  Administered 2023-01-26: 80 ug via INTRAVENOUS

## 2023-01-26 MED ORDER — FENTANYL CITRATE (PF) 100 MCG/2ML IJ SOLN
INTRAMUSCULAR | Status: AC
  Filled 2023-01-26: qty 2

## 2023-01-26 MED ORDER — TRAMADOL HCL 50 MG PO TABS
50.0000 mg | ORAL_TABLET | Freq: Four times a day (QID) | ORAL | Status: DC | PRN
  Administered 2023-01-27: 50 mg via ORAL

## 2023-01-26 MED ORDER — LOSARTAN POTASSIUM 50 MG PO TABS
25.0000 mg | ORAL_TABLET | Freq: Every day | ORAL | Status: DC
  Administered 2023-01-26: 25 mg via ORAL

## 2023-01-26 MED ORDER — PROPOFOL 500 MG/50ML IV EMUL
INTRAVENOUS | Status: DC | PRN
  Administered 2023-01-26: 50 ug/kg/min via INTRAVENOUS

## 2023-01-26 MED ORDER — ASPIRIN 81 MG PO CHEW
81.0000 mg | CHEWABLE_TABLET | Freq: Every day | ORAL | Status: DC
  Administered 2023-01-26 – 2023-01-27 (×2): 81 mg via ORAL

## 2023-01-26 MED ORDER — GLYCOPYRROLATE 0.2 MG/ML IJ SOLN
INTRAMUSCULAR | Status: DC | PRN
  Administered 2023-01-26: .2 mg via INTRAVENOUS

## 2023-01-26 MED ORDER — PROPOFOL 1000 MG/100ML IV EMUL
INTRAVENOUS | Status: AC
  Filled 2023-01-26: qty 100

## 2023-01-26 MED ORDER — ASPIRIN 81 MG PO CHEW
CHEWABLE_TABLET | ORAL | Status: AC
  Filled 2023-01-26: qty 1

## 2023-01-26 MED ORDER — 0.9 % SODIUM CHLORIDE (POUR BTL) OPTIME
TOPICAL | Status: DC | PRN
  Administered 2023-01-26: 1000 mL

## 2023-01-26 MED ORDER — SURGIPHOR WOUND IRRIGATION SYSTEM - OPTIME
TOPICAL | Status: DC | PRN
  Administered 2023-01-26: 450 mL

## 2023-01-26 MED ORDER — ROSUVASTATIN CALCIUM 5 MG PO TABS
5.0000 mg | ORAL_TABLET | Freq: Every day | ORAL | Status: DC
  Administered 2023-01-26: 5 mg via ORAL
  Filled 2023-01-26: qty 1

## 2023-01-26 MED ORDER — PHENYLEPHRINE HCL-NACL 20-0.9 MG/250ML-% IV SOLN
INTRAVENOUS | Status: AC
  Filled 2023-01-26: qty 250

## 2023-01-26 MED ORDER — ONDANSETRON HCL 4 MG/2ML IJ SOLN
INTRAMUSCULAR | Status: DC | PRN
  Administered 2023-01-26 (×2): 4 mg via INTRAVENOUS

## 2023-01-26 MED ORDER — FENTANYL CITRATE (PF) 100 MCG/2ML IJ SOLN
INTRAMUSCULAR | Status: DC | PRN
  Administered 2023-01-26 (×2): 50 ug via INTRAVENOUS

## 2023-01-26 MED ORDER — PANTOPRAZOLE SODIUM 40 MG PO TBEC
DELAYED_RELEASE_TABLET | ORAL | Status: AC
  Filled 2023-01-26: qty 1

## 2023-01-26 MED ORDER — DOCUSATE SODIUM 100 MG PO CAPS
100.0000 mg | ORAL_CAPSULE | Freq: Two times a day (BID) | ORAL | Status: DC
  Administered 2023-01-26 – 2023-01-27 (×3): 100 mg via ORAL

## 2023-01-26 MED ORDER — ORAL CARE MOUTH RINSE: 15.0000 mL | OROMUCOSAL | Status: DC | PRN

## 2023-01-26 MED ORDER — BUPIVACAINE HCL (PF) 0.25 % IJ SOLN
INTRAMUSCULAR | Status: AC
  Filled 2023-01-26: qty 30

## 2023-01-26 MED ORDER — BUPIVACAINE HCL (PF) 0.5 % IJ SOLN
INTRAMUSCULAR | Status: DC | PRN
  Administered 2023-01-26: 3 mL via INTRATHECAL

## 2023-01-26 MED ORDER — DOCUSATE SODIUM 100 MG PO CAPS
ORAL_CAPSULE | ORAL | Status: AC
  Filled 2023-01-26: qty 1

## 2023-01-26 MED ORDER — HYDROCODONE-ACETAMINOPHEN 5-325 MG PO TABS
1.0000 | ORAL_TABLET | ORAL | Status: DC | PRN
  Administered 2023-01-26 (×2): 1 via ORAL

## 2023-01-26 MED ORDER — PROPOFOL 10 MG/ML IV BOLUS
INTRAVENOUS | Status: AC
  Filled 2023-01-26: qty 20

## 2023-01-26 MED ORDER — BUPIVACAINE HCL (PF) 0.25 % IJ SOLN
INTRAMUSCULAR | Status: DC | PRN
  Administered 2023-01-26: 30 mL

## 2023-01-26 MED ORDER — MENTHOL 3 MG MT LOZG: 1.0000 | LOZENGE | OROMUCOSAL | Status: DC | PRN

## 2023-01-26 MED ORDER — EPINEPHRINE (ANAPHYLAXIS) 30 MG/30ML IJ SOLN
INTRAMUSCULAR | Status: DC | PRN
  Administered 2023-01-26: .15 mg via SUBCUTANEOUS

## 2023-01-26 MED ORDER — HYDROCODONE-ACETAMINOPHEN 5-325 MG PO TABS
ORAL_TABLET | ORAL | Status: AC
  Filled 2023-01-26: qty 1

## 2023-01-26 MED ORDER — ACETAMINOPHEN 500 MG PO TABS
ORAL_TABLET | ORAL | Status: AC
  Filled 2023-01-26: qty 2

## 2023-01-26 MED ORDER — ACETAMINOPHEN 500 MG PO TABS
1000.0000 mg | ORAL_TABLET | Freq: Three times a day (TID) | ORAL | Status: DC
  Administered 2023-01-26 – 2023-01-27 (×3): 1000 mg via ORAL

## 2023-01-26 MED ORDER — TRANEXAMIC ACID 1000 MG/10ML IV SOLN
INTRAVENOUS | Status: AC
  Filled 2023-01-26: qty 10

## 2023-01-26 MED ORDER — BUPIVACAINE LIPOSOME 1.3 % IJ SUSP
INTRAMUSCULAR | Status: DC | PRN
  Administered 2023-01-26: 20 mL

## 2023-01-26 MED ORDER — METOCLOPRAMIDE HCL 5 MG PO TABS: 5.0000 mg | ORAL_TABLET | Freq: Three times a day (TID) | ORAL | Status: DC | PRN

## 2023-01-26 MED ORDER — PHENYLEPHRINE HCL-NACL 20-0.9 MG/250ML-% IV SOLN
INTRAVENOUS | Status: DC | PRN
  Administered 2023-01-26: 15 ug/min via INTRAVENOUS

## 2023-01-26 MED ORDER — DEXAMETHASONE SODIUM PHOSPHATE 10 MG/ML IJ SOLN
8.0000 mg | Freq: Once | INTRAMUSCULAR | Status: AC
  Administered 2023-01-26: 10 mg via INTRAVENOUS

## 2023-01-26 MED ORDER — KETOROLAC TROMETHAMINE 15 MG/ML IJ SOLN
7.5000 mg | Freq: Four times a day (QID) | INTRAMUSCULAR | Status: AC
  Administered 2023-01-26 – 2023-01-27 (×4): 7.5 mg via INTRAVENOUS

## 2023-01-26 MED ORDER — MIDAZOLAM HCL 2 MG/2ML IJ SOLN
INTRAMUSCULAR | Status: AC
  Filled 2023-01-26: qty 2

## 2023-01-26 MED ORDER — VALACYCLOVIR HCL 500 MG PO TABS
1000.0000 mg | ORAL_TABLET | Freq: Every day | ORAL | Status: DC
  Administered 2023-01-27: 1000 mg via ORAL
  Filled 2023-01-26: qty 2

## 2023-01-26 MED ORDER — TRANEXAMIC ACID-NACL 1000-0.7 MG/100ML-% IV SOLN
INTRAVENOUS | Status: DC | PRN
  Administered 2023-01-26 (×2): 1000 mg via INTRAVENOUS

## 2023-01-26 MED ORDER — CHLORHEXIDINE GLUCONATE 0.12 % MT SOLN
15.0000 mL | Freq: Once | OROMUCOSAL | Status: AC
  Administered 2023-01-26: 15 mL via OROMUCOSAL

## 2023-01-26 MED ORDER — EPHEDRINE SULFATE (PRESSORS) 50 MG/ML IJ SOLN
INTRAMUSCULAR | Status: DC | PRN
  Administered 2023-01-26: 5 mg via INTRAVENOUS

## 2023-01-26 MED ORDER — METOPROLOL TARTRATE 25 MG PO TABS
ORAL_TABLET | ORAL | Status: AC
  Filled 2023-01-26: qty 1

## 2023-01-26 MED ORDER — KETOROLAC TROMETHAMINE 15 MG/ML IJ SOLN
INTRAMUSCULAR | Status: AC
  Filled 2023-01-26: qty 1

## 2023-01-26 MED ORDER — OXYCODONE HCL 5 MG PO TABS
ORAL_TABLET | ORAL | Status: AC
  Filled 2023-01-26: qty 1

## 2023-01-26 MED ORDER — FENTANYL CITRATE (PF) 100 MCG/2ML IJ SOLN: 25.0000 ug | INTRAMUSCULAR | Status: DC | PRN

## 2023-01-26 MED ORDER — CEFAZOLIN SODIUM-DEXTROSE 2-4 GM/100ML-% IV SOLN
2.0000 g | INTRAVENOUS | Status: AC
  Administered 2023-01-26: 2 g via INTRAVENOUS

## 2023-01-26 MED ORDER — FAMOTIDINE 20 MG PO TABS
ORAL_TABLET | ORAL | Status: AC
  Filled 2023-01-26: qty 1

## 2023-01-26 MED ORDER — PHENOL 1.4 % MT LIQD: 1.0000 | OROMUCOSAL | Status: DC | PRN

## 2023-01-26 MED ORDER — LOSARTAN POTASSIUM 50 MG PO TABS
ORAL_TABLET | ORAL | Status: AC
  Filled 2023-01-26: qty 1

## 2023-01-26 MED ORDER — FAMOTIDINE 20 MG PO TABS
20.0000 mg | ORAL_TABLET | Freq: Once | ORAL | Status: AC
  Administered 2023-01-26: 20 mg via ORAL

## 2023-01-26 MED ORDER — ONDANSETRON HCL 4 MG/2ML IJ SOLN: 4.0000 mg | Freq: Four times a day (QID) | INTRAMUSCULAR | Status: DC | PRN

## 2023-01-26 MED ORDER — PANTOPRAZOLE SODIUM 40 MG PO TBEC
40.0000 mg | DELAYED_RELEASE_TABLET | Freq: Every day | ORAL | Status: DC
  Administered 2023-01-26 – 2023-01-27 (×2): 40 mg via ORAL

## 2023-01-26 MED ORDER — SODIUM CHLORIDE FLUSH 0.9 % IV SOLN
INTRAVENOUS | Status: AC
  Filled 2023-01-26: qty 20

## 2023-01-26 MED ORDER — CEFAZOLIN SODIUM-DEXTROSE 2-4 GM/100ML-% IV SOLN
2.0000 g | Freq: Four times a day (QID) | INTRAVENOUS | Status: AC
  Administered 2023-01-26 (×2): 2 g via INTRAVENOUS

## 2023-01-26 MED ORDER — PROPOFOL 10 MG/ML IV BOLUS
INTRAVENOUS | Status: AC
  Filled 2023-01-26: qty 40

## 2023-01-26 MED ORDER — ACETAMINOPHEN 10 MG/ML IV SOLN
INTRAVENOUS | Status: DC | PRN
  Administered 2023-01-26: 1000 mg via INTRAVENOUS

## 2023-01-26 MED ORDER — METOCLOPRAMIDE HCL 5 MG/ML IJ SOLN: 5.0000 mg | Freq: Three times a day (TID) | INTRAMUSCULAR | Status: DC | PRN

## 2023-01-26 MED ORDER — ONDANSETRON HCL 4 MG PO TABS: 4.0000 mg | ORAL_TABLET | Freq: Four times a day (QID) | ORAL | Status: DC | PRN

## 2023-01-26 MED ORDER — MORPHINE SULFATE (PF) 4 MG/ML IV SOLN: 0.5000 mg | INTRAVENOUS | Status: DC | PRN

## 2023-01-26 MED ORDER — ACETAMINOPHEN 10 MG/ML IV SOLN
INTRAVENOUS | Status: AC
  Filled 2023-01-26: qty 100

## 2023-01-26 MED ORDER — LEVOTHYROXINE SODIUM 50 MCG PO TABS
50.0000 ug | ORAL_TABLET | Freq: Every day | ORAL | Status: DC
  Administered 2023-01-27: 50 ug via ORAL
  Filled 2023-01-26: qty 1

## 2023-01-26 MED ORDER — MIDAZOLAM HCL 5 MG/5ML IJ SOLN
INTRAMUSCULAR | Status: DC | PRN
  Administered 2023-01-26 (×2): 1 mg via INTRAVENOUS

## 2023-01-26 MED ORDER — METOPROLOL TARTRATE 25 MG PO TABS
25.0000 mg | ORAL_TABLET | Freq: Two times a day (BID) | ORAL | Status: DC
  Administered 2023-01-26: 25 mg via ORAL

## 2023-01-26 MED ORDER — ENOXAPARIN SODIUM 30 MG/0.3ML IJ SOSY
30.0000 mg | PREFILLED_SYRINGE | Freq: Two times a day (BID) | INTRAMUSCULAR | Status: DC
  Administered 2023-01-27: 30 mg via SUBCUTANEOUS

## 2023-01-26 MED ORDER — CHLORHEXIDINE GLUCONATE 0.12 % MT SOLN
OROMUCOSAL | Status: AC
  Filled 2023-01-26: qty 15

## 2023-01-26 MED ORDER — DEXAMETHASONE SODIUM PHOSPHATE 10 MG/ML IJ SOLN
INTRAMUSCULAR | Status: AC
  Filled 2023-01-26: qty 1

## 2023-01-26 MED ORDER — LACTATED RINGERS IV SOLN: INTRAVENOUS | Status: DC

## 2023-01-26 MED ORDER — TRANEXAMIC ACID-NACL 1000-0.7 MG/100ML-% IV SOLN
INTRAVENOUS | Status: AC
  Filled 2023-01-26: qty 100

## 2023-01-26 MED ORDER — SODIUM CHLORIDE 0.9 % IR SOLN
Status: DC | PRN
  Administered 2023-01-26: 3000 mL

## 2023-01-26 SURGICAL SUPPLY — 83 items
ADH SKN CLS APL DERMABOND .7 (GAUZE/BANDAGES/DRESSINGS) ×1
APL PRP STRL LF DISP 70% ISPRP (MISCELLANEOUS) ×2
BLADE PATELLA REAM PILOT HOLE (MISCELLANEOUS) IMPLANT
BLADE SAW 90X13X1.19 OSCILLAT (BLADE) IMPLANT
BLADE SAW SAG 25X90X1.19 (BLADE) ×1 IMPLANT
BLADE SAW SAG 29X58X.64 (BLADE) ×1 IMPLANT
BNDG CMPR 5X62 HK CLSR LF (GAUZE/BANDAGES/DRESSINGS) ×1
BNDG ELASTIC 6INX 5YD STR LF (GAUZE/BANDAGES/DRESSINGS) ×1 IMPLANT
BOWL CEMENT MIX W/ADAPTER (MISCELLANEOUS) ×1 IMPLANT
BSPLAT TIB 5D D CMNT STM LT (Knees) ×1 IMPLANT
CEMENT BONE R 1X40 (Cement) ×2 IMPLANT
CHLORAPREP W/TINT 26 (MISCELLANEOUS) ×2 IMPLANT
COMP FEM CMT PS STD 5 LT (Joint) ×1 IMPLANT
COMPONENT FEM CMT PS STD 5 LT (Joint) IMPLANT
COOLER POLAR GLACIER W/PUMP (MISCELLANEOUS) ×1 IMPLANT
CUFF TOURN SGL QUICK 24 (TOURNIQUET CUFF)
CUFF TOURN SGL QUICK 34 (TOURNIQUET CUFF)
CUFF TRNQT CYL 24X4X16.5-23 (TOURNIQUET CUFF) IMPLANT
CUFF TRNQT CYL 34X4.125X (TOURNIQUET CUFF) IMPLANT
DERMABOND ADVANCED .7 DNX12 (GAUZE/BANDAGES/DRESSINGS) ×1 IMPLANT
DRAPE 3/4 80X56 (DRAPES) ×1 IMPLANT
DRAPE INCISE IOBAN 66X60 STRL (DRAPES) ×1 IMPLANT
DRSG MEPILEX SACRM 8.7X9.8 (GAUZE/BANDAGES/DRESSINGS) ×1 IMPLANT
DRSG OPSITE POSTOP 4X10 (GAUZE/BANDAGES/DRESSINGS) IMPLANT
DRSG OPSITE POSTOP 4X8 (GAUZE/BANDAGES/DRESSINGS) IMPLANT
ELECT CAUTERY BLADE 6.4 (BLADE) ×1 IMPLANT
ELECT REM PT RETURN 9FT ADLT (ELECTROSURGICAL) ×1
ELECTRODE REM PT RTRN 9FT ADLT (ELECTROSURGICAL) ×1 IMPLANT
GLOVE BIO SURGEON STRL SZ8 (GLOVE) ×1 IMPLANT
GLOVE BIOGEL PI IND STRL 8 (GLOVE) ×1 IMPLANT
GLOVE PI ORTHO PRO STRL 7.5 (GLOVE) ×2 IMPLANT
GLOVE PI ORTHO PRO STRL SZ8 (GLOVE) ×2 IMPLANT
GOWN SRG XL LVL 3 NONREINFORCE (GOWNS) ×1 IMPLANT
GOWN STRL NON-REIN TWL XL LVL3 (GOWNS) ×1
GOWN STRL REUS W/ TWL LRG LVL3 (GOWN DISPOSABLE) ×1 IMPLANT
GOWN STRL REUS W/ TWL XL LVL3 (GOWN DISPOSABLE) ×1 IMPLANT
GOWN STRL REUS W/TWL LRG LVL3 (GOWN DISPOSABLE) ×1
GOWN STRL REUS W/TWL XL LVL3 (GOWN DISPOSABLE) ×1
HANDLE YANKAUER SUCT OPEN TIP (MISCELLANEOUS) ×1 IMPLANT
HDLS TROCR DRIL PIN KNEE 75 (PIN) ×1
HOLDER FOLEY CATH W/STRAP (MISCELLANEOUS) ×1 IMPLANT
HOOD PEEL AWAY T7 (MISCELLANEOUS) ×3 IMPLANT
INSERT ASF PERS 11 CD/4-5 LT (Insert) IMPLANT
IV NS IRRIG 3000ML ARTHROMATIC (IV SOLUTION) ×1 IMPLANT
KIT TURNOVER KIT A (KITS) ×1 IMPLANT
MANIFOLD NEPTUNE II (INSTRUMENTS) ×1 IMPLANT
MARKER SKIN DUAL TIP RULER LAB (MISCELLANEOUS) ×1 IMPLANT
MAT ABSORB  FLUID 56X50 GRAY (MISCELLANEOUS) ×1
MAT ABSORB FLUID 56X50 GRAY (MISCELLANEOUS) ×1 IMPLANT
NDL HYPO 21X1.5 SAFETY (NEEDLE) ×1 IMPLANT
NDL SAFETY ECLIP 18X1.5 (MISCELLANEOUS) ×1 IMPLANT
NEEDLE HYPO 21X1.5 SAFETY (NEEDLE) ×1 IMPLANT
NS IRRIG 500ML POUR BTL (IV SOLUTION) ×1 IMPLANT
PACK TOTAL KNEE (MISCELLANEOUS) ×1 IMPLANT
PAD WRAPON POLAR KNEE (MISCELLANEOUS) ×1 IMPLANT
PENCIL SMOKE EVACUATOR (MISCELLANEOUS) ×1 IMPLANT
PIN DRILL HDLS TROCAR 75 4PK (PIN) IMPLANT
PULSAVAC PLUS IRRIG FAN TIP (DISPOSABLE) ×1
SCREW FEMALE HEX FIX 25X2.5 (ORTHOPEDIC DISPOSABLE SUPPLIES) IMPLANT
SCREW HEX HEADED 3.5X27 DISP (ORTHOPEDIC DISPOSABLE SUPPLIES) IMPLANT
SLEEVE SCD COMPRESS KNEE MED (STOCKING) ×1 IMPLANT
SOLUTION IRRIG SURGIPHOR (IV SOLUTION) ×1 IMPLANT
STEM POLY PAT PLY 29M KNEE (Knees) IMPLANT
STEM TIB ST PERS 14+30 (Stem) IMPLANT
STEM TIBIA 5 DEG SZ D L KNEE (Knees) IMPLANT
SUT DVC 2 QUILL PDO  T11 36X36 (SUTURE) ×1
SUT DVC 2 QUILL PDO T11 36X36 (SUTURE) ×1 IMPLANT
SUT QUILL MONODERM 3-0 PS-2 (SUTURE) ×1 IMPLANT
SUT VIC AB 0 CT1 36 (SUTURE) ×1 IMPLANT
SUT VIC AB 2-0 CT2 27 (SUTURE) ×2 IMPLANT
SUT VICRYL 1-0 27IN ABS (SUTURE) ×1
SUTURE VICRYL 1-0 27IN ABS (SUTURE) ×1 IMPLANT
SYR 10ML LL (SYRINGE) ×1 IMPLANT
SYR 30ML LL (SYRINGE) ×2 IMPLANT
SYR 3ML LL SCALE MARK (SYRINGE) ×1 IMPLANT
TAPE CLOTH 3X10 WHT NS LF (GAUZE/BANDAGES/DRESSINGS) ×1 IMPLANT
TIBIA STEM 5 DEG SZ D L KNEE (Knees) ×1 IMPLANT
TIP FAN IRRIG PULSAVAC PLUS (DISPOSABLE) ×1 IMPLANT
TOWEL OR 17X26 4PK STRL BLUE (TOWEL DISPOSABLE) IMPLANT
TRAP FLUID SMOKE EVACUATOR (MISCELLANEOUS) ×1 IMPLANT
TRAY FOLEY SLVR 16FR LF STAT (SET/KITS/TRAYS/PACK) ×1 IMPLANT
WATER STERILE IRR 1000ML POUR (IV SOLUTION) ×1 IMPLANT
WRAPON POLAR PAD KNEE (MISCELLANEOUS) ×1

## 2023-01-26 NOTE — Anesthesia Procedure Notes (Signed)
Procedure Name: General with mask airway Date/Time: 01/26/2023 7:54 AM  Performed by: Mohammed Kindle, CRNAPre-anesthesia Checklist: Patient identified, Emergency Drugs available, Suction available and Patient being monitored Patient Re-evaluated:Patient Re-evaluated prior to induction Oxygen Delivery Method: Simple face mask Induction Type: IV induction Placement Confirmation: positive ETCO2, CO2 detector and breath sounds checked- equal and bilateral Dental Injury: Teeth and Oropharynx as per pre-operative assessment

## 2023-01-26 NOTE — Op Note (Signed)
Patient Name: Claudia Martin  GNF:[id number]  Pre-Operative Diagnosis: Left knee Osteoarthritis  Post-Operative Diagnosis: (same)  Procedure: Left Total Knee Arthroplasty  Components/Implants: Femur: Persona Size 5 CR   Tibia: Persona Size D w/ 14x65mm stem extension  Poly: 11mm MC  Patella: 29x20mm symmetric   Femoral Valgus Cut Angle: 5 degrees  Distal Femoral Re-cut: none  Patella Resurfacing: yes   Date of Surgery: 01/26/2023  Surgeon: Reinaldo Berber MD  Assistant: Amador Cunas PA (present and scrubbed throughout the case, critical for assistance with exposure, retraction, instrumentation, and closure)   Anesthesiologist: Fletcher-Harrison  Anesthesia: Spinal   Tourniquet Time: 64 min  EBL: 50cc  IVF: 500cc  Complications: None   Brief history: The patient is a 77 year old female with a history of osteoarthritis of the left knee with pain limiting their range of motion and activities of daily living, which has failed multiple attempts at conservative therapy.  The risks and benefits of total knee arthroplasty as definitive surgical treatment were discussed with the patient, who opted to proceed with the operation.  After outpatient medical clearance and optimization was completed the patient was admitted to [facility] for the procedure.  All preoperative films were reviewed and an appropriate surgical plan was made prior to surgery. Preoperative range of motion was 0 to 115. The patient was identified as having a Valgus alignment.   Description of procedure: The patient was brought to the operating room where laterality was confirmed by all those present to be the left side.   Spinal anesthesia was administered and the patient received an intravenous dose of antibiotics for surgical prophylaxis and a dose of tranexamic acid.  Patient is positioned supine on the operating room table with all bony prominences well-padded.  A well-padded tourniquet was  applied to the left thigh.  The knee was then prepped and draped in usual sterile fashion with multiple layers of adhesive and nonadhesive drapes.  All of those present in the operating room participated in a surgical timeout laterality and patient were confirmed.   An Esmarch was wrapped around the extremity and the leg was elevated and the knee flexed.  The tourniquet was inflated to a pressure of 275 mmHg. The Esmarch was removed and the leg was brought down to full extension.  The patella and tibial tubercle identified and outlined using a marking pen and a midline skin incision was made with a knife carried through the subcutaneous tissue down to the extensor retinaculum.  After exposure of the extensor mechanism the medial parapatellar arthrotomy was performed with a scalpel and electrocautery extending down medial and distal to the tibial tubercle taking care to avoid incising the patellar tendon.   A standard medial release was performed over the proximal tibia.  The knee was brought into extension in order to excise the fat pad taking care not to damage the patella tendon.  The superior soft tissue was removed from the anterior surface of the distal femur to visualize for the procedure.  The knee was then brought into flexion with the patella subluxed laterally and subluxing the tibia anteriorly.  The ACL was transected and removed with electrocautery and additional soft tissue was removed from the proximal surface of the tibia to fully expose. The PCL was found to be intact and was preserved.  An extramedullary tibial cutting guide was then applied to the leg with a spring-loaded ankle clamp placed around the distal tibia just above the malleoli the angulation of the guide was adjusted  to give some posterior slope in the tibial resection with an appropriate varus/valgus alignment.  The resection guide was then pinned to the proximal tibia and the proximal tibial surface was resected with an  oscillating saw.  Careful attention was paid to ensure the blade did not disrupt any of the soft tissues including any lateral or medial ligament.  Attention was then turned to the femur, with the knee slightly flexed a opening drill was used to enter the medullary canal of the femur.  After removing the drill marrow was suctioned out to decompress the distal femur.  An intramedullary femoral guide was then inserted into the drill hole and the alignment guide was seated firmly against the distal end of the medial femoral condyle.  The distal femoral cutting guide was then attached and pinned securely to the anterior surface of the femur and the intramedullary rod and alignment guide was removed.  Distal femur resection was then performed with an oscillating saw with retractors protecting medial and laterally.   The distal cutting block was then removed and the extension gap was checked with a spacer.  Extension gap was found to be appropriately sized to accommodate the spacer block.   The femoral sizing guide was then placed securely into the posterior condyles of the femur and the femoral size was measured and determined to be 5.  The size 5; 4-in-1 cutting guide was placed in position and secured with 2 pins.  The anterior posterior and chamfer resections were then performed with an oscillating saw.  Bony fragments and osteophytes were then removed.  Using a lamina spreader the posterior medial and lateral condyles were checked for additional osteophytes and posterior soft tissue remnants.  Any remaining meniscus was removed at this time.  Periarticular injection was performed in the meniscal rims and posterior capsule with aspiration performed to ensure no intravascular injection.   The tibia was then exposed and the tibial trial was pinned onto the plateau after confirming appropriate orientation and rotation.  Using the drill bushing the tibia was prepared to the appropriate drill depth.  Tibial broach  impactor was then driven through the punch guide using a mallet.  The femoral trial component was then inserted onto the femur.  A trial tibial polyethylene bearing was then placed and the knee was reduced.  The knee achieved full extension with no hyperextension and was found to be balanced in flexion and extension with the trials in place.  The knee was then brought into full extension the patella was everted and held with 2 Kocher clamps.  The articular surface of the patella was then resected with an patella reamer and saw after careful measurement with a caliper.  The patella was then prepared with the drill guide and a trial patella was placed.  The knee was then taken through range of motion and it was found that the patella articulated appropriately with the trochlea and good patellofemoral motion without subluxation.    The correct final components for implantation were confirmed and opened by the circulator nurse.  The prepared surfaces of the patella femur and tibia were cleaned with pulsatile lavage to remove all blood fat and other material and then the surfaces were dried.  2 bags of cement were mixed under vacuum and the components were cemented into place.  Excess cement was removed with curettes and forceps. A trial polyethylene tibial component was placed and the knee was brought into extension to allow the cement to set.  At this  time the periarticular injection cocktail was placed in the soft tissues surrounding the knee.  After full curing of the cement the balance of the knee was checked again and the final polyethylene size was confirmed. The tibial component was irrigated and locking mechanism checked to ensure it was clear of debris. The real polyethylene tibial component was implanted and the knee was brought through a range of motion.   The knee was then irrigated with copious amount of normal saline via pulsatile lavage to remove all loose bodies and other debris.  The knee was then  irrigated with surgiphor betadine based wash and reirrigated with saline.  The tourniquet was then dropped and all bleeding vessels were identified and coagulated.  The arthrotomy was approximated with #1 Vicryl and closed with #2 Quill suture.  The knee was brought into slight flexion and the subcutaneous tissues were closed with 0 Vicryl, 2-0 Vicryl and a running subcuticular 3-0 monoderm quil suture.  Skin was then glued with Dermabond.  A sterile adhesive dressing was then placed along with a sequential compression device to the calf, a Ted stocking, and a cryotherapy cuff.   Sponge, needle, and Lap counts were all correct at the end of the case.   The patient was transferred off of the operating room table to a hospital bed, good pulses were found distally on the operative side.  The patient was transferred to the recovery room in stable condition.

## 2023-01-26 NOTE — Evaluation (Signed)
Physical Therapy Evaluation Patient Details Name: Telisha Zawadzki MRN: [id number] DOB: 1945-10-17 Today's Date: 01/26/2023  History of Present Illness  Pt is a 77 yo female s/p L TKA. PMH of HTN, sleep apnea, cardiac stents with past MI, hypothyroidism, renal disease, abdominal wall repair.   Clinical Impression  Patient alert, agreeable to PT, denied pain. Able to move LLE against gravity. Pt reported at baseline she lives with her husband, modI with a SPC.   She was able to perform several supine exercises with tactile cues. Supine to sit with CGA, fair sitting balance. Sit <> Stand with RW and CGA, somewhat effortful but no assistance needed. Pt noted for L knee instability during step pivot to recliner, intermittent minA to ensure safety, true ambulation deferred.  Overall the patient demonstrated deficits (see "PT Problem List") that impede the patient's functional abilities, safety, and mobility and would benefit from skilled PT intervention. Recommendation is to continue skilled PT interventions to return to PLOF.        Recommendations for follow up therapy are one component of a multi-disciplinary discharge planning process, led by the attending physician.  Recommendations may be updated based on patient status, additional functional criteria and insurance authorization.  Follow Up Recommendations       Assistance Recommended at Discharge Intermittent Supervision/Assistance  Patient can return home with the following  Assistance with cooking/housework;Assist for transportation;Help with stairs or ramp for entrance;A little help with bathing/dressing/bathroom    Equipment Recommendations None recommended by PT  Recommendations for Other Services       Functional Status Assessment Patient has had a recent decline in their functional status and demonstrates the ability to make significant improvements in function in a reasonable and predictable amount of time.      Precautions / Restrictions Precautions Precautions: Fall Restrictions Weight Bearing Restrictions: Yes LLE Weight Bearing: Weight bearing as tolerated      Mobility  Bed Mobility Overal bed mobility: Needs Assistance Bed Mobility: Supine to Sit     Supine to sit: Min guard          Transfers Overall transfer level: Needs assistance Equipment used: Rolling walker (2 wheels) Transfers: Sit to/from Stand, Bed to chair/wheelchair/BSC Sit to Stand: Min guard   Step pivot transfers: Min assist, Min guard            Ambulation/Gait               General Gait Details: deferred due to some instability  Stairs            Wheelchair Mobility    Modified Rankin (Stroke Patients Only)       Balance Overall balance assessment: Needs assistance Sitting-balance support: Feet supported Sitting balance-Leahy Scale: Good     Standing balance support: Bilateral upper extremity supported Standing balance-Leahy Scale: Fair                               Pertinent Vitals/Pain Pain Assessment Pain Assessment: No/denies pain    Home Living Family/patient expects to be discharged to:: Private residence Living Arrangements: Spouse/significant other Available Help at Discharge: Family;Available 24 hours/day Type of Home: House Home Access: Stairs to enter   [employer] of Steps: 1+1   Home Layout: One level;Laundry or work area in [employer] Equipment: Agricultural consultant (2 wheels);Gilmer Mor - single point      Prior Function  Mobility Comments: Pt reports she uses a cane for mobility       Hand Dominance   Dominant Hand: Right    Extremity/Trunk Assessment   Upper Extremity Assessment Upper Extremity Assessment: Overall WFL for tasks assessed    Lower Extremity Assessment Lower Extremity Assessment: Overall WFL for tasks assessed    Cervical / Trunk Assessment Cervical / Trunk Assessment: Normal   Communication   Communication: No difficulties  Cognition Arousal/Alertness: Awake/alert Behavior During Therapy: WFL for tasks assessed/performed Overall Cognitive Status: Within Functional Limits for tasks assessed                                          General Comments      Exercises Total Joint Exercises Ankle Circles/Pumps: AROM, Both, 10 reps Quad Sets: AROM, Left, 10 reps Heel Slides: AAROM, Left, 10 reps   Assessment/Plan    PT Assessment Patient needs continued PT services  PT Problem List Decreased mobility;Decreased strength;Decreased range of motion;Decreased activity tolerance;Decreased balance;Pain;Decreased knowledge of use of DME       PT Treatment Interventions DME instruction;Therapeutic activities;Gait training;Therapeutic exercise;Patient/family education;Stair training;Balance training;Functional mobility training;Neuromuscular re-education    PT Goals (Current goals can be found in the Care Plan section)  Acute Rehab PT Goals Patient Stated Goal: to go home PT Goal Formulation: With patient Time For Goal Achievement: 02/09/23 Potential to Achieve Goals: Good    Frequency BID     Co-evaluation               AM-PAC PT "6 Clicks" Mobility  Outcome Measure Help needed turning from your back to your side while in a flat bed without using bedrails?: None Help needed moving from lying on your back to sitting on the side of a flat bed without using bedrails?: None Help needed moving to and from a bed to a chair (including a wheelchair)?: A Little Help needed standing up from a chair using your arms (e.g., wheelchair or bedside chair)?: A Little Help needed to walk in hospital room?: A Little Help needed climbing 3-5 steps with a railing? : A Little 6 Click Score: 20    End of Session Equipment Utilized During Treatment: Gait belt Activity Tolerance: Patient tolerated treatment well Patient left: in chair;with family/visitor  present;with call bell/phone within reach Nurse Communication: Mobility status PT Visit Diagnosis: Other abnormalities of gait and mobility (R26.89)    Time: 7829-5621 PT Time Calculation (min) (ACUTE ONLY): 19 min   Charges:   PT Evaluation $PT Eval Low Complexity: 1 Low PT Treatments $Therapeutic Activity: 8-22 mins        Olga Coaster PT, DPT 3:38 PM,01/26/23

## 2023-01-26 NOTE — Anesthesia Procedure Notes (Addendum)
Spinal  Patient location during procedure: OR Start time: 01/26/2023 7:35 AM End time: 01/26/2023 7:36 AM Reason for block: surgical anesthesia Staffing Performed: anesthesiologist  Anesthesiologist: Agam Tuohy, Cleda Mccreedy, MD Performed by: Rosaria Ferries, MD Authorized by: Rosaria Ferries, MD   Preanesthetic Checklist Completed: patient identified, IV checked, site marked, risks and benefits discussed, surgical consent, monitors and equipment checked, pre-op evaluation and timeout performed Spinal Block Patient position: sitting Prep: ChloraPrep Patient monitoring: heart rate, continuous pulse ox, blood pressure and cardiac monitor Approach: midline Location: L3-4 Injection technique: single-shot Needle Needle type: Whitacre and Introducer  Needle gauge: 24 G Needle length: 9 cm Assessment Sensory level: T10 Events: CSF return Additional Notes Sterile aseptic technique used throughout the procedure.  Negative paresthesia. Negative blood return. Positive free-flowing CSF. Expiration date of kit checked and confirmed. Patient tolerated procedure well, without complications.

## 2023-01-26 NOTE — Anesthesia Preprocedure Evaluation (Signed)
Anesthesia Evaluation  Patient identified by MRN, date of birth, ID band Patient awake    Reviewed: Allergy & Precautions, NPO status , Patient's Chart, lab work & pertinent test results  History of Anesthesia Complications Negative for: history of anesthetic complications  Airway Mallampati: III  TM Distance: <3 FB Neck ROM: full    Dental  (+) Chipped, Poor Dentition, Missing   Pulmonary neg shortness of breath, sleep apnea and Continuous Positive Airway Pressure Ventilation    Pulmonary exam normal        Cardiovascular Exercise Tolerance: Good hypertension, + CAD, + Past MI and + Cardiac Stents  Normal cardiovascular exam     Neuro/Psych  Neuromuscular disease  negative psych ROS   GI/Hepatic Neg liver ROS,GERD  Controlled,,  Endo/Other  Hypothyroidism    Renal/GU Renal disease     Musculoskeletal   Abdominal   Peds  Hematology negative hematology ROS (+)   Anesthesia Other Findings Past Medical History: No date: Allergic rhinitis No date: Aortic atherosclerosis No date: Arthritis 06/06/2021: CAD (coronary artery disease)     Comment:  a.) STEMI --> LHC: EF 55-65%, 100% RI --> PCI performed               placing a 2.5 x 18 mm Onyx Frontier DES No date: Cellulitis and abscess 06/07/2021: Diastolic dysfunction     Comment:  a.) TTE 06/07/2021: EF 55-60%, no RWMAs, triv MR, mild               AoV sclerosis with no stenosis, G1DD; b.) TTE 07/17/2021:              EF >55%, mild LVH, no RWMAs; triv TR, mild MR, G1DD No date: Diverticulosis No date: Enlarged heart No date: Eyelid inflammation No date: GERD (gastroesophageal reflux disease) No date: Hematuria syndrome No date: Hemorrhoids No date: Herpes genitalis No date: Herpes simplex No date: HTN (hypertension) No date: Hydronephrosis No date: Hypothyroidism No date: Incomplete bladder emptying No date: Internal hordeolum No date: Long term current  use of antithrombotics/antiplatelets     Comment:  a.) on daily DAPT therapy (ASA + ticagrelor) x 1 year               s/p STEMI; ticagrelor has been discontinued No date: OSA on CPAP No date: Primary ovarian failure No date: Sciatica No date: Shortness of breath No date: Sinus bradycardia 06/06/2021: STEMI (ST elevation myocardial infarction)     Comment:  a.) LHC/PCI 06/06/2021 --> 100% RI (2.5 x 18 mm Onyx               Frontier DES) No date: Urethral diverticulum No date: Vaginal atrophy  Past Surgical History: 05/27/2022: ABDOMINAL WALL DEFECT REPAIR; N/A     Comment:  Procedure: REPAIR ABDOMINAL WALL, reconstruction;                Surgeon: Leafy Ro, MD;  Location: [facility];                Service: General;  Laterality: N/A; No date: BREAST BIOPSY; Bilateral     Comment:  benign 06/05/2021: CATARACT EXTRACTION W/PHACO; Right     Comment:  Procedure: CATARACT EXTRACTION PHACO AND INTRAOCULAR               LENS PLACEMENT (IOC) RIGHT;  Surgeon: Lockie Mola, MD;  Location: [facility];  Service:  Ophthalmology;  Laterality: Right;  3.02 00:39.5 12/04/2021: CATARACT EXTRACTION W/PHACO; Left     Comment:  Procedure: CATARACT EXTRACTION PHACO AND INTRAOCULAR               LENS PLACEMENT (IOC) LEFT;  Surgeon: Lockie Mola, MD;  Location: [facility];  Service:               Ophthalmology;  Laterality: Left;  6.23 00:48.0 No date: COLON SURGERY 03/15/2018: COLONOSCOPY WITH PROPOFOL; N/A     Comment:  Procedure: COLONOSCOPY WITH PROPOFOL;  Surgeon: Toney Reil, MD;  Location: [facility];  Service:               Gastroenterology;  Laterality: N/A; 01/09/2021: COLONOSCOPY WITH PROPOFOL; N/A     Comment:  Procedure: COLONOSCOPY WITH PROPOFOL;  Surgeon: Toney Reil, MD;  Location: [facility];  Service:               Gastroenterology;  Laterality:  N/A; 06/06/2021: CORONARY/GRAFT ACUTE MI REVASCULARIZATION; N/A     Comment:  Procedure: Coronary/Graft Acute MI Revascularization;                Surgeon: Alwyn Pea, MD;  Location: [facility]              [facility];  Service: Cardiovascular;  Laterality: N/A; No date: ECTOPIC PREGNANCY SURGERY No date: fx thumb No date: HERNIA REPAIR 05/27/2022: INSERTION OF MESH; N/A     Comment:  Procedure: INSERTION OF MESH;  Surgeon: Leafy Ro,               MD;  Location: [facility];  Service: General;  Laterality:               N/A; 06/06/2021: LEFT HEART CATH AND CORONARY ANGIOGRAPHY; N/A     Comment:  Procedure: LEFT HEART CATH AND CORONARY ANGIOGRAPHY;                Surgeon: Alwyn Pea, MD;  Location: [facility]              CV LAB;  Service: Cardiovascular;  Laterality: N/A; 05/27/2022: VENTRAL HERNIA REPAIR; N/A     Comment:  Procedure: HERNIA REPAIR VENTRAL ADULT, recurrent;                Surgeon: Leafy Ro, MD;  Location: [facility];                Service: General;  Laterality: N/A;  BMI    Body Mass Index: 34.05 kg/m      Reproductive/Obstetrics negative OB ROS                             Anesthesia Physical Anesthesia Plan  ASA: 3  Anesthesia Plan: Spinal   Post-op Pain Management:    Induction:   PONV Risk Score and Plan:   Airway Management Planned: Natural Airway and Nasal Cannula  Additional Equipment:   Intra-op Plan:   Post-operative Plan:   Informed Consent: I have reviewed the patients History and Physical, chart, labs and discussed the procedure including the risks, benefits and alternatives for the proposed anesthesia with the patient or  authorized representative who has indicated his/her understanding and acceptance.     Dental Advisory Given  Plan Discussed with: Anesthesiologist, CRNA and Surgeon  Anesthesia Plan Comments: (Patient reports no bleeding problems and no anticoagulant use.  Plan  for spinal with backup GA  Patient consented for risks of anesthesia including but not limited to:  - adverse reactions to medications - damage to eyes, teeth, lips or other oral mucosa - nerve damage due to positioning  - risk of bleeding, infection and or nerve damage from spinal that could lead to paralysis - risk of headache or failed spinal - damage to teeth, lips or other oral mucosa - sore throat or hoarseness - damage to heart, brain, nerves, lungs, other parts of body or loss of life  Patient voiced understanding.)       Anesthesia Quick Evaluation

## 2023-01-26 NOTE — Transfer of Care (Signed)
Immediate Anesthesia Transfer of Care Note  Patient: Claudia Martin  Procedure(s) Performed: TOTAL KNEE ARTHROPLASTY (Left: Knee)  Patient Location: PACU  Anesthesia Type:Spinal  Level of Consciousness: awake, drowsy, and patient cooperative  Airway & Oxygen Therapy: Patient Spontanous Breathing  Post-op Assessment: Report given to RN and Post -op Vital signs reviewed and stable  Post vital signs: Reviewed and stable  Last Vitals:  Vitals Value Taken Time  BP    Temp    Pulse 86 01/26/23 0953  Resp 18 01/26/23 0953  SpO2 99 % 01/26/23 0953  Vitals shown include unvalidated device data.  Last Pain:  Vitals:   01/26/23 0624  TempSrc: Temporal  PainSc: 4          Complications: No notable events documented.

## 2023-01-26 NOTE — H&P (Signed)
History of Present Illness: The patient is an 77 y.o. female seen in clinic today for evaluation of billateral knees. Patient reports she has had pain in both of her knees for almost 10 years which is lately been significantly worse in her left knee than her right with instability and swelling. She reports pain along the lateral aspect of both knees without radiation described as a constant ache getting up to a 6 or 7 out of 10 at worst with episodes of near giving out of both knees especially on the left side. She reports she is undergone multiple treatments with cortisone and gel injections for several years but that she has avoided any shots for the last few years. She reports it is becoming a functional limitation for her affecting her ability to walk due to her knees feeling grossly unstable despite using knee braces and a cane for ambulation. She is been unable to participate in knee strengthening exercises due to instability and pain in her knees. She reports she is at the point where it is no longer functional for her to live day-to-day with her knees. The patient denies fevers, chills, numbness, tingling, shortness of breath, chest pain, recent illness, or any trauma.  She does have a history of MI and underwent PCI with a drug-eluting stent currently only on baby aspirin with no residual symptoms and is followed by cardiology. The patient is a non-smoker, nondiabetic, BMI of 36.6 with a last hemoglobin A1c of 5.9  Past Medical History: Past Medical History:  Diagnosis Date  Arthritis  Cataracts, bilateral  Environmental allergies  Hemorrhoids  Hernia  Hypertension  Umbilical hernia   Past Surgical History: Past Surgical History:  Procedure Laterality Date  Carpal Tunnel Release Bilateral  LAPAROSCOPY FOR ECTOPIC PREGNANCY  Surgery for Diverticulitis  Tubal Iigation  UMBILICAL HERNIA REPAIR   Past Family History: Family History  Problem Relation Age of Onset  Myocardial  Infarction (Heart attack) Father  High blood pressure (Hypertension) Father  High blood pressure (Hypertension) Mother  Stroke Mother  Diabetes Mother  Kidney failure Mother  Diabetes Sister   Medications: Current Outpatient Medications Ordered in Epic  Medication Sig Dispense Refill  acetaminophen (TYLENOL) 325 MG tablet Take 325 mg by mouth every 8 (eight) hours as needed for Pain.  aspirin 81 MG chewable tablet Take 1 tablet (81 mg total) by mouth once daily 90 tablet 3  cetirizine (ZYRTEC) 10 MG tablet Take by mouth.  chlorhexidine (PERIDEX) 0.12 % solution SWISH AND SPIT with 5 milliliters by mouth twice a day if needed 0  EPIPEN 2-PAK 0.3 mg/0.3 mL (1:1,000) pen injector as needed. 0  famciclovir (FAMVIR) 500 MG tablet  ILEVRO 0.3 % DrpS INSTILL 1 DROP IN THE RIGHT EYE DAILY STARTING 2 DAYS PRIOR TO SURGERY AND CONTINUE USE TILL FINISH  ketoconazole (NIZORAL) 2 % cream Apply topically 3 (three) times daily.  losartan (COZAAR) 25 MG tablet TAKE 1 TABLET(25 MG) BY MOUTH EVERY DAY 90 tablet 3  metoprolol tartrate (LOPRESSOR) 25 MG tablet TAKE 1 TABLET(25 MG) BY MOUTH TWICE DAILY 90 tablet 3  nitroGLYcerin (NITROSTAT) 0.4 MG SL tablet Place 0.4 mg under the tongue every 5 (five) minutes as needed DO NOT EXCEED A TOTAL OF 3 DOSES IN 15 MINUTES  rosuvastatin (CRESTOR) 5 MG tablet Take 5 mg by mouth once daily  SYNTHROID 50 mcg tablet Take 1 tablet by mouth once daily. 0  valACYclovir (VALTREX) 1000 MG tablet Take 1 tablet by mouth once daily. 0  VITAMIN D2 50,000 unit capsule Take 50,000 Units by mouth once a week 0  conjugated estrogens (PREMARIN) 0.625 mg/gram vaginal cream Place vaginally twice a week. (Patient not taking: Reported on 01/14/2023)   No current Epic-ordered facility-administered medications on file.   Allergies: Allergies  Allergen Reactions  Ioxaglate Sodium Diarrhea and Hives  Amoxicillin-Pot Clavulanate Other (See Comments)  Doxycycline Unknown  Dye Hives and  Diarrhea  Ibuprofen Unknown  Iodine Hives  Nitrofurantoin Other (See Comments)  Other Diarrhea and Hives  Prednisone Nausea and Dizziness  Sulfamethoxazole-Trimethoprim Hives    Visit Vitals: Vitals:  01/14/23 1033  BP: (!) 146/88    Review of Systems:  A comprehensive 14 point ROS was performed, reviewed, and the pertinent orthopaedic findings are documented in the HPI.  Physical Exam: General/Constitutional: No apparent distress: well-nourished and well developed. Eyes: Pupils equal, round with synchronous movement. Pulmonary exam: Lungs clear to auscultation bilaterally no wheezing rales or rhonchi Cardiac exam: Regular rate and rhythm no obvious murmurs rubs or gallops. Integumentary: No impressive skin lesions present, except as noted in detailed exam. Neuro/Psych: Normal mood and affect, oriented to person, place and time.  Comprehensive Knee Exam: Gait Non-antalgic and fluid  Alignment Valgus thrust bilaterally   Inspection Right Left  Skin Normal appearance with no obvious deformity. No ecchymosis or erythema. Normal appearance with no obvious deformity. No ecchymosis or erythema.  Soft Tissue No focal soft tissue swelling No focal soft tissue swelling  Quad Atrophy Moderate Moderate   Palpation  Right Left  Tenderness Lateral joint line tenderness to palpation and parapatellar tenderness Joint line tenderness palpation and parapatellar tenderness  Crepitus + patellofemoral and tibiofemoral crepitus + patellofemoral and tibiofemoral crepitus  Effusion None None   Range of Motion            Right     Left  Flexion 0-115 0-115  Extension Full knee extension without hyperextension Full knee extension without hyperextension   Ligamentous Exam Right Left  Lachman 2+ with endpoint 2+ with endpoint  Valgus 0 Normal Firm endpoint  Valgus 30 Normal Normal  Varus 0 Partially correctable valgus deformity Partially correctable valgus deformity  Varus 30 Normal Normal   Anterior Drawer 2+ with endpoint 2+ with endpoint  Posterior Drawer Normal Normal   Meniscal Exam Right Left  Hyperflexion Test Positive Positive  Hyperextension Test Positive Positive  McMurray's Positive Positive   Neurovascular Right Left  Quadriceps Strength 5/5 5/5  Hamstring Strength 5/5 5/5  Hip Abductor Strength 4/5 4/5  Distal Motor Normal Normal  Distal Sensory Normal light touch sensation Normal light touch sensation  Distal Pulses Normal Normal    Imaging Studies: I have reviewed AP, lateral,sunrise, and flexed PA weight bearing knee X-rays (5 views) of the bilateral knees ordered and taken today in the office show severe degenerative changes with lateral and patellofemoral joint space narrowing and osteophyte formation, subchondral cysts and sclerosis. Kellgren-Lawrence grade 4 in both knees. Valgus angulation of the right knee measuring 12 degrees, valgus angulation of the left knee measuring 15 degrees.   Assessment:  ICD-10-CM  1. Chronic pain of both knees M25.561  M25.562  G89.29  Bilateral knee osteoarthritis  Plan: Andreina is a 77 year old female who presents with bilateral knee bone on bone arthritis with more significant daily symptoms at this time in her left knee. We did discuss the possibility of doing more physical therapy or injections but given her severe dysfunction at this time she does not feel that those interventions would  significantly help her which I agree with based on her clinical and radiographic exam. Based upon the patient's continued symptoms and failure to respond to conservative treatment, I have recommended a left total knee replacement for this patient. A long discussion took place with the patient describing what a total joint replacement is and what the procedure would entail. A knee model, similar to the implants that will be used during the operation, was utilized to demonstrate the implants. Choices of implant manufactures were  discussed and reviewed. The ability to secure the implant utilizing cement or cementless (press fit) fixation was discussed. The approach and exposure was discussed.   The hospitalization and post-operative care and rehabilitation were also discussed. The use of perioperative antibiotics and DVT prophylaxis were discussed. The risk, benefits and alternatives to a surgical intervention were discussed at length with the patient. The patient was also advised of risks related to the medical comorbidities and elevated body mass index (BMI). A lengthy discussion took place to review the most common complications including but not limited to: stiffness, loss of function, complex regional pain syndrome, deep vein thrombosis, pulmonary embolus, heart attack, stroke, infection, wound breakdown, numbness, intraoperative fracture, damage to nerves, tendon,muscles, arteries or other blood vessels, death and other possible complications from anesthesia. The patient was told that we will take steps to minimize these risks by using sterile technique, antibiotics and DVT prophylaxis when appropriate and follow the patient postoperatively in the office setting to monitor progress. The possibility of recurrent pain, no improvement in pain and actual worsening of pain were also discussed with the patient. We specifically discussed her valgus angulation of her knee and her increased risk of peroneal nerve injury after surgery.  The discharge plan of care focused on the patient going home following surgery. The patient was encouraged to make the necessary arrangements to have someone stay with them when they are discharged home.   The benefits of surgery were discussed with the patient including the potential for improving the patient's current clinical condition through operative intervention. Alternatives to surgical intervention including continued conservative management were also discussed in detail. All questions were  answered to the satisfaction of the patient. The patient participated and agreed to the plan of care as well as the use of the recommended implants for their total knee replacement surgery. An information packet was given to the patient to review prior to surgery.   The patient received clearance for surgery. All questions answered the patient agrees with above plan to proceed with preparations for a left total knee replacement.  Portions of this record have been created using Scientist, clinical (histocompatibility and immunogenetics). Dictation errors have been sought, but may not have been identified and corrected.  Reinaldo Berber MD

## 2023-01-26 NOTE — Interval H&P Note (Signed)
Patient history and physical updated. Consent reviewed including risks, benefits, and alternatives to surgery. Patient agrees with above plan to proceed with left total knee arthroplasty.  

## 2023-01-27 DIAGNOSIS — M170 Bilateral primary osteoarthritis of knee: Secondary | ICD-10-CM | POA: Diagnosis not present

## 2023-01-27 DIAGNOSIS — I2510 Atherosclerotic heart disease of native coronary artery without angina pectoris: Secondary | ICD-10-CM | POA: Diagnosis not present

## 2023-01-27 DIAGNOSIS — Z79899 Other long term (current) drug therapy: Secondary | ICD-10-CM | POA: Diagnosis not present

## 2023-01-27 DIAGNOSIS — E039 Hypothyroidism, unspecified: Secondary | ICD-10-CM | POA: Diagnosis not present

## 2023-01-27 DIAGNOSIS — I10 Essential (primary) hypertension: Secondary | ICD-10-CM | POA: Diagnosis not present

## 2023-01-27 DIAGNOSIS — Z7982 Long term (current) use of aspirin: Secondary | ICD-10-CM | POA: Diagnosis not present

## 2023-01-27 LAB — CBC
HCT: 34.2 % — ABNORMAL LOW (ref 36.0–46.0)
Hemoglobin: 11.4 g/dL — ABNORMAL LOW (ref 12.0–15.0)
MCH: 29.9 pg (ref 26.0–34.0)
MCHC: 33.3 g/dL (ref 30.0–36.0)
MCV: 89.8 fL (ref 80.0–100.0)
Platelets: 189 10*3/uL (ref 150–400)
RBC: 3.81 MIL/uL — ABNORMAL LOW (ref 3.87–5.11)
RDW: 13.4 % (ref 11.5–15.5)
WBC: 12.4 10*3/uL — ABNORMAL HIGH (ref 4.0–10.5)
nRBC: 0 % (ref 0.0–0.2)

## 2023-01-27 LAB — BASIC METABOLIC PANEL
Anion gap: 6 (ref 5–15)
BUN: 21 mg/dL (ref 8–23)
CO2: 26 mmol/L (ref 22–32)
Calcium: 8.7 mg/dL — ABNORMAL LOW (ref 8.9–10.3)
Chloride: 106 mmol/L (ref 98–111)
Creatinine, Ser: 0.93 mg/dL (ref 0.44–1.00)
GFR, Estimated: >60 mL/min (ref >60)
Glucose, Bld: 120 mg/dL — ABNORMAL HIGH (ref 70–99)
Potassium: 4.3 mmol/L (ref 3.5–5.1)
Sodium: 138 mmol/L (ref 135–145)

## 2023-01-27 MED ORDER — ASPIRIN 81 MG PO CHEW
CHEWABLE_TABLET | ORAL | Status: AC
  Filled 2023-01-27: qty 1

## 2023-01-27 MED ORDER — LOSARTAN POTASSIUM 50 MG PO TABS
ORAL_TABLET | ORAL | Status: AC
  Filled 2023-01-27: qty 1

## 2023-01-27 MED ORDER — TRAMADOL HCL 50 MG PO TABS: 50.0000 mg | ORAL_TABLET | Freq: Four times a day (QID) | ORAL | 0 refills | Status: DC | PRN

## 2023-01-27 MED ORDER — DOCUSATE SODIUM 100 MG PO CAPS: 100.0000 mg | ORAL_CAPSULE | Freq: Two times a day (BID) | ORAL | 0 refills | Status: DC

## 2023-01-27 MED ORDER — METOPROLOL TARTRATE 25 MG PO TABS
ORAL_TABLET | ORAL | Status: AC
  Filled 2023-01-27: qty 1

## 2023-01-27 MED ORDER — ENOXAPARIN SODIUM 30 MG/0.3ML IJ SOSY
PREFILLED_SYRINGE | INTRAMUSCULAR | Status: AC
  Filled 2023-01-27: qty 0.3

## 2023-01-27 MED ORDER — KETOROLAC TROMETHAMINE 15 MG/ML IJ SOLN
INTRAMUSCULAR | Status: AC
  Filled 2023-01-27: qty 1

## 2023-01-27 MED ORDER — ACETAMINOPHEN 500 MG PO TABS: 1000.0000 mg | ORAL_TABLET | Freq: Three times a day (TID) | ORAL | 0 refills | Status: DC

## 2023-01-27 MED ORDER — ACETAMINOPHEN 500 MG PO TABS
ORAL_TABLET | ORAL | Status: AC
  Filled 2023-01-27: qty 2

## 2023-01-27 MED ORDER — ENOXAPARIN SODIUM 40 MG/0.4ML IJ SOSY: 40.0000 mg | PREFILLED_SYRINGE | INTRAMUSCULAR | 0 refills | Status: DC

## 2023-01-27 MED ORDER — TRAMADOL HCL 50 MG PO TABS
ORAL_TABLET | ORAL | Status: AC
  Filled 2023-01-27: qty 1

## 2023-01-27 MED ORDER — PANTOPRAZOLE SODIUM 40 MG PO TBEC
DELAYED_RELEASE_TABLET | ORAL | Status: AC
  Filled 2023-01-27: qty 1

## 2023-01-27 MED ORDER — DOCUSATE SODIUM 100 MG PO CAPS
ORAL_CAPSULE | ORAL | Status: AC
  Filled 2023-01-27: qty 1

## 2023-01-27 NOTE — Discharge Summary (Signed)
Physician Discharge Summary  Patient ID: Stanislawa Gaffin MRN: [id number] DOB/AGE: [birth year] 77 y.o.  Admit date: 01/26/2023 Discharge date: 01/27/2023  Admission Diagnoses:  S/P TKR (total knee replacement) using cement, left [Z96.652]   Discharge Diagnoses: Patient Active Problem List   Diagnosis Date Noted   S/P TKR (total knee replacement) using cement, left 01/26/2023   Allergic rhinitis 07/02/2022   Wound dehiscence    S/P hernia repair 05/27/2022   Chronic allergic conjunctivitis 12/02/2021   Allergic rhinitis due to animal (cat) (dog) hair and dander 12/02/2021   Allergic rhinitis due to pollen 12/02/2021   STEMI (ST elevation myocardial infarction) 06/06/2021   History of colonic polyps    Candidiasis of skin 01/04/2020   Ventral hernia without obstruction or gangrene 11/28/2019   Herpes simplex infection 10/27/2019   Stomatitis and mucositis 10/27/2019   Vitamin B12 deficiency 06/30/2018   Unspecified menopausal and perimenopausal disorder 06/30/2018   Vitamin D deficiency 06/30/2018   Acquired hypothyroidism 02/05/2018   Essential hypertension 02/05/2018   Obstructive sleep apnea (adult) (pediatric) 01/06/2018    Past Medical History:  Diagnosis Date   Allergic rhinitis    Aortic atherosclerosis    Arthritis    CAD (coronary artery disease) 06/06/2021   a.) STEMI --> LHC: EF 55-65%, 100% RI --> PCI performed placing a 2.5 x 18 mm Onyx Frontier DES   Cellulitis and abscess    Diastolic dysfunction 06/07/2021   a.) TTE 06/07/2021: EF 55-60%, no RWMAs, triv MR, mild AoV sclerosis with no stenosis, G1DD; b.) TTE 07/17/2021: EF >55%, mild LVH, no RWMAs; triv TR, mild MR, G1DD   Diverticulosis    Enlarged heart    Eyelid inflammation    GERD (gastroesophageal reflux disease)    Hematuria syndrome    Hemorrhoids    Herpes genitalis    Herpes simplex    HTN (hypertension)    Hydronephrosis    Hypothyroidism    Incomplete bladder emptying    Internal  hordeolum    Long term current use of antithrombotics/antiplatelets    a.) on daily DAPT therapy (ASA + ticagrelor) x 1 year s/p STEMI; ticagrelor has been discontinued   OSA on CPAP    Primary ovarian failure    Sciatica    Shortness of breath    Sinus bradycardia    STEMI (ST elevation myocardial infarction) 06/06/2021   a.) LHC/PCI 06/06/2021 --> 100% RI (2.5 x 18 mm Onyx Frontier DES)   Urethral diverticulum    Vaginal atrophy      Transfusion: none   Consultants (if any):   Discharged Condition: Improved  Hospital Course: Renata Gambino is an 77 y.o. female who was admitted 01/26/2023 with a diagnosis of S/P TKR (total knee replacement) using cement, left and went to the operating room on 01/26/2023 and underwent the above named procedures.    Surgeries: Procedure(s): TOTAL KNEE ARTHROPLASTY on 01/26/2023 Patient tolerated the surgery well. Taken to PACU where she was stabilized and then transferred to the orthopedic floor.  Started on Lovenox 30 mg q 12 hrs. TEDs and SCDs applied bilaterally. Heels elevated on bed. No evidence of DVT. Negative Homan. Physical therapy started on day #1 for gait training and transfer. OT started day #1 for ADL and assisted devices.  Patient's IV was d/c on day #1. Patient was able to safely and independently complete all PT goals. PT recommending discharge to home.    On post op day #1 patient was stable and ready for discharge to  home with HHPT.  Implants: Femur: Persona Size 5 CR   Tibia: Persona Size D w/ 14x19mm stem extension  Poly: 11mm MC  Patella: 29x22mm symmetric   She was given perioperative antibiotics:  Anti-infectives (From admission, onward)    Start     Dose/Rate Route Frequency Ordered Stop   01/27/23 1000  valACYclovir (VALTREX) tablet 1,000 mg       Note to Pharmacy: TAKE 1 TABLET BY MOUTH DAILY. CAN TAKE 1 TABLET BY MOUTH TWICE DAILY IF FLARE UP FOR 7 DAYS     1,000 mg Oral Daily 01/26/23 1153     01/26/23 1330   ceFAZolin (ANCEF) IVPB 2g/100 mL premix        2 g 200 mL/hr over 30 Minutes Intravenous Every 6 hours 01/26/23 1152 01/26/23 1937   01/26/23 0615  ceFAZolin (ANCEF) IVPB 2g/100 mL premix        2 g 200 mL/hr over 30 Minutes Intravenous On call to O.R. 01/26/23 0981 01/26/23 0743     .  She was given sequential compression devices, early ambulation, and Lovenox TEDs for DVT prophylaxis.  She benefited maximally from the hospital stay and there were no complications.    Recent vital signs:  Vitals:   01/27/23 0033 01/27/23 0546  BP: 125/70 (!) 145/71  Pulse: 66 (!) 53  Resp: 16 18  Temp: 98 F (36.7 C) (!) 96.8 F (36 C)  SpO2: 96% 97%    Recent laboratory studies:  Lab Results  Component Value Date   HGB 11.4 (L) 01/27/2023   HGB 13.0 01/20/2023   HGB 13.4 12/15/2022   Lab Results  Component Value Date   WBC 12.4 (H) 01/27/2023   PLT 189 01/27/2023   Lab Results  Component Value Date   INR 1.0 03/12/2022   Lab Results  Component Value Date   NA 138 01/27/2023   K 4.3 01/27/2023   CL 106 01/27/2023   CO2 26 01/27/2023   BUN 21 01/27/2023   CREATININE 0.93 01/27/2023   GLUCOSE 120 (H) 01/27/2023    Discharge Medications:   Allergies as of 01/27/2023       Reactions   Other Diarrhea, Hives   Augmentin [amoxicillin-pot Clavulanate]    Bactrim [sulfamethoxazole-trimethoprim] Hives   Doxycycline    Ibuprofen    Other reaction(s): Unknown   Iodinated Contrast Media Diarrhea, Other (See Comments)   Macrobid [nitrofurantoin]    Prednisone Nausea Only   Other reaction(s): Dizziness   Sulfa Antibiotics Other (See Comments)        Medication List     TAKE these medications    acetaminophen 500 MG tablet Commonly known as: TYLENOL Take 2 tablets (1,000 mg total) by mouth every 8 (eight) hours. What changed:  medication strength how much to take when to take this reasons to take this   acyclovir ointment 5 % Commonly known as: ZOVIRAX Apply  topically.   aspirin 81 MG chewable tablet Chew 1 tablet (81 mg total) by mouth daily.   cetirizine 10 MG tablet Commonly known as: ZYRTEC Take 10 mg by mouth daily.   chlorhexidine 0.12 % solution Commonly known as: PERIDEX USE 5 ML AS DIRECTED IN THE MOUTH OR THROAT TWICE DAILY What changed:  how much to take how to take this when to take this reasons to take this   docusate sodium 100 MG capsule Commonly known as: COLACE Take 1 capsule (100 mg total) by mouth 2 (two) times daily.   enoxaparin 40  MG/0.4ML injection Commonly known as: LOVENOX Inject 0.4 mLs (40 mg total) into the skin daily for 14 days.   EPINEPHrine 0.3 mg/0.3 mL Soaj injection Commonly known as: EPI-PEN as needed.   levothyroxine 50 MCG tablet Commonly known as: SYNTHROID TAKE 1 TABLET(50 MCG) BY MOUTH DAILY BEFORE AND BREAKFAST   losartan 25 MG tablet Commonly known as: COZAAR Take 1 tablet by mouth daily.   metoprolol tartrate 25 MG tablet Commonly known as: LOPRESSOR Take 1 tablet (25 mg total) by mouth 2 (two) times daily.   nitroGLYCERIN 0.4 MG SL tablet Commonly known as: NITROSTAT Place 1 tablet (0.4 mg total) under the tongue every 5 (five) minutes x 3 doses as needed for chest pain.   rosuvastatin 5 MG tablet Commonly known as: CRESTOR Take 5 mg by mouth at bedtime.   traMADol 50 MG tablet Commonly known as: ULTRAM Take 1 tablet (50 mg total) by mouth every 6 (six) hours as needed for moderate pain.   valACYclovir 1000 MG tablet Commonly known as: VALTREX TAKE 1 TABLET BY MOUTH DAILY. CAN TAKE 1 TABLET BY MOUTH TWICE DAILY IF FLARE UP FOR 7 DAYS        Diagnostic Studies: DG Knee Left Port  Result Date: 01/26/2023 CLINICAL DATA:  Status post total left knee arthroplasty. EXAM: PORTABLE LEFT KNEE - 1-2 VIEW COMPARISON:  Left knee radiograph 03/28/2010 FINDINGS: Left total knee prosthesis with cemented femoral and tibial components is in appropriate position without evidence  of complication. Small suprapatellar joint effusion. A small amount air is seen within the knee joint, consistent with recent postoperative status. IMPRESSION: Expected postoperative changes of left total knee arthroplasty. Electronically Signed   By: Sherron Ales M.D.   On: 01/26/2023 10:25    Disposition:      Follow-up Information     Evon Slack, PA-C Follow up in 2 week(s).   Specialties: Orthopedic Surgery, Emergency Medicine Contact information: [address] [place] [address] [phone]                  Signed: Patience Musca 01/27/2023, 8:20 AM

## 2023-01-27 NOTE — Progress Notes (Signed)
Pts BP 112/57 HR 55 PA [employer] notified, Per Thayer Ohm ok to hold morning dose of Losartan and Metoprolol. Pt made aware

## 2023-01-27 NOTE — Evaluation (Signed)
Occupational Therapy Evaluation Patient Details Name: Claudia Martin MRN: [id number] DOB: [birth year] Today's Date: 01/27/2023   History of Present Illness Pt is a 77 yo female s/p L TKA. PMH of HTN, sleep apnea, cardiac stents with past MI, hypothyroidism, renal disease, abdominal wall repair.   Clinical Impression   Pt seen for OT evaluation this date, POD#1 from above surgery. Pt was independent in all ADL prior to surgery and is eager to return to PLOF with less pain and improved safety and independence. Pt currently requires PRN minimal assist for LB dressing and bathing while in seated position due to pain and limited AROM of L knee. Pt instructed in polar care mgt, falls prevention strategies, home/routines modifications, DME/AE for LB bathing and dressing tasks, and compression stocking mgt. Pt verbalized understanding. Handout provided. Do not currently anticipate any additional OT needs following this hospitalization.  Will sign off.    Recommendations for follow up therapy are one component of a multi-disciplinary discharge planning process, led by the attending physician.  Recommendations may be updated based on patient status, additional functional criteria and insurance authorization.   Assistance Recommended at Discharge PRN  Patient can return home with the following A little help with bathing/dressing/bathroom;Assistance with cooking/housework;Assist for transportation;Help with stairs or ramp for entrance    Functional Status Assessment  Patient has had a recent decline in their functional status and demonstrates the ability to make significant improvements in function in a reasonable and predictable amount of time.  Equipment Recommendations  BSC/3in1    Recommendations for Other Services       Precautions / Restrictions Precautions Precautions: Fall;Knee Precaution Booklet Issued: Yes (comment) Restrictions Weight Bearing Restrictions: Yes LLE Weight  Bearing: Weight bearing as tolerated      Mobility Bed Mobility Overal bed mobility: Needs Assistance Bed Mobility: Supine to Sit     Supine to sit: Supervision          Transfers Overall transfer level: Needs assistance Equipment used: Rolling walker (2 wheels) Transfers: Sit to/from Stand Sit to Stand: Min guard, Supervision                  Balance Overall balance assessment: Needs assistance Sitting-balance support: Feet supported Sitting balance-Leahy Scale: Good     Standing balance support: Bilateral upper extremity supported Standing balance-Leahy Scale: Good                             ADL either performed or assessed with clinical judgement   ADL                                         General ADL Comments: Pt requires PRN MIN A for LB ADL tasks, family able to provide     Vision         Perception     Praxis      Pertinent Vitals/Pain Pain Assessment Pain Assessment: No/denies pain     Hand Dominance Right   Extremity/Trunk Assessment Upper Extremity Assessment Upper Extremity Assessment: Overall WFL for tasks assessed   Lower Extremity Assessment Lower Extremity Assessment: Overall WFL for tasks assessed   Cervical / Trunk Assessment Cervical / Trunk Assessment: Normal   Communication Communication Communication: No difficulties   Cognition Arousal/Alertness: Awake/alert Behavior During Therapy: WFL for tasks assessed/performed Overall Cognitive Status: Within Functional Limits for  tasks assessed                                       General Comments       Exercises Other Exercises Other Exercises: Pt educated in home/routines modifications, falls prevention, AE/DME, compression stocking mgt, and polar care mgt   Shoulder Instructions      Home Living Family/patient expects to be discharged to:: Private residence Living Arrangements: Spouse/significant other Available  Help at Discharge: Family;Available 24 hours/day Type of Home: House Home Access: Stairs to enter [employer] of Steps: 1+1   Home Layout: One level;Laundry or work area in basement     [employer] Shower/Tub: IT trainer: Pharmacist, community: Yes   Home Equipment: Agricultural consultant (2 wheels);Cane - single point          Prior Functioning/Environment Prior Level of Function : Independent/Modified Independent             Mobility Comments: Pt reports she uses a cane for mobility          OT Problem List: Decreased strength;Decreased range of motion      OT Treatment/Interventions:      OT Goals(Current goals can be found in the care plan section) Acute Rehab OT Goals Patient Stated Goal: go home OT Goal Formulation: All assessment and education complete, DC therapy  OT Frequency:      Co-evaluation              AM-PAC OT "6 Clicks" Daily Activity     Outcome Measure Help from another person eating meals?: None Help from another person taking care of personal grooming?: None Help from another person toileting, which includes using toliet, bedpan, or urinal?: None Help from another person bathing (including washing, rinsing, drying)?: A Little Help from another person to put on and taking off regular upper body clothing?: None Help from another person to put on and taking off regular lower body clothing?: A Little 6 Click Score: 22   End of Session Nurse Communication: Mobility status  Activity Tolerance: Patient tolerated treatment well Patient left: Other (comment) (in bathroom, wiht PT)  OT Visit Diagnosis: Other abnormalities of gait and mobility (R26.89)                Time: 4259-5638 OT Time Calculation (min): 15 min Charges:  OT General Charges $OT Visit: 1 Visit OT Evaluation $OT Eval Low Complexity: 1 Low OT Treatments $Self Care/Home Management : 8-22 mins  Arman Filter., MPH, MS, OTR/L ascom  [phone] 01/27/23, 10:31 AM

## 2023-01-27 NOTE — Plan of Care (Signed)
  Problem: Activity: Goal: Ability to avoid complications of mobility impairment will improve Outcome: Progressing   Problem: Pain Management: Goal: Pain level will decrease with appropriate interventions Outcome: Progressing   Problem: Skin Integrity: Goal: Will show signs of wound healing Outcome: Progressing   

## 2023-01-27 NOTE — Progress Notes (Signed)
Physical Therapy Treatment Patient Details Name: Claudia Martin MRN: [id number] DOB: 1946-05-18 Today's Date: 01/27/2023   History of Present Illness Pt is a 77 yo female s/p L TKA. PMH of HTN, sleep apnea, cardiac stents with past MI, hypothyroidism, renal disease, abdominal wall repair.    PT Comments    Patient alert, agreeable to PT, up in bathroom with OT. Pericare in standing with supervision, and able to wash hands at sink with good balance. She ambulated ~216ft with RW and CGA-supervision. Pt needed verbal cues and facilitation of RW to progress to step through pattern. She also performed stair navigation with CGA, able to teach back safe technique. The patient would benefit from further skilled PT intervention to continue to progress towards goals.     Recommendations for follow up therapy are one component of a multi-disciplinary discharge planning process, led by the attending physician.  Recommendations may be updated based on patient status, additional functional criteria and insurance authorization.  Follow Up Recommendations       Assistance Recommended at Discharge Intermittent Supervision/Assistance  Patient can return home with the following Assistance with cooking/housework;Assist for transportation;Help with stairs or ramp for entrance;A little help with bathing/dressing/bathroom   Equipment Recommendations  None recommended by PT    Recommendations for Other Services       Precautions / Restrictions Precautions Precautions: Fall;Knee Precaution Booklet Issued: Yes (comment) Restrictions Weight Bearing Restrictions: Yes LLE Weight Bearing: Weight bearing as tolerated     Mobility  Bed Mobility               General bed mobility comments: pt up in bathroom upon PT entrance    Transfers Overall transfer level: Needs assistance Equipment used: Rolling walker (2 wheels) Transfers: Sit to/from Stand Sit to Stand: Min guard, Supervision            General transfer comment: cued for hand placement    Ambulation/Gait Ambulation/Gait assistance: Min guard, Supervision Gait Distance (Feet): 200 Feet Assistive device: Rolling walker (2 wheels)         General Gait Details: progressed to supervision and cued verbally and with RW facilitation for step through pattern   Stairs Stairs: Yes Stairs assistance: Min guard Stair Management: Two rails, Step to pattern Number of Stairs: 4 General stair comments: able to teach back safe technique   Wheelchair Mobility    Modified Rankin (Stroke Patients Only)       Balance Overall balance assessment: Needs assistance Sitting-balance support: Feet supported Sitting balance-Leahy Scale: Good       Standing balance-Leahy Scale: Good Standing balance comment: pericare in standing with unilateral support                            Cognition Arousal/Alertness: Awake/alert Behavior During Therapy: WFL for tasks assessed/performed Overall Cognitive Status: Within Functional Limits for tasks assessed                                          Exercises Total Joint Exercises Heel Slides: AROM, Seated, 15 reps, Strengthening Long Arc Quad: AROM, Strengthening, Left, 15 reps Goniometric ROM: 0-95    General Comments        Pertinent Vitals/Pain Pain Assessment Pain Assessment: 0-10 Pain Score: 1  Pain Location: L knee with flexion Pain Descriptors / Indicators: Sore Pain Intervention(s): Limited activity within  patient's tolerance, Monitored during session, Ice applied    Home Living                          Prior Function            PT Goals (current goals can now be found in the care plan section) Progress towards PT goals: Progressing toward goals    Frequency    BID      PT Plan Current plan remains appropriate    Co-evaluation              AM-PAC PT "6 Clicks" Mobility   Outcome Measure  Help  needed turning from your back to your side while in a flat bed without using bedrails?: None Help needed moving from lying on your back to sitting on the side of a flat bed without using bedrails?: None Help needed moving to and from a bed to a chair (including a wheelchair)?: A Little Help needed standing up from a chair using your arms (e.g., wheelchair or bedside chair)?: A Little Help needed to walk in hospital room?: A Little Help needed climbing 3-5 steps with a railing? : A Little 6 Click Score: 20    End of Session Equipment Utilized During Treatment: Gait belt Activity Tolerance: Patient tolerated treatment well Patient left: in chair;with call bell/phone within reach Nurse Communication: Mobility status PT Visit Diagnosis: Other abnormalities of gait and mobility (R26.89);Pain Pain - Right/Left: Left Pain - part of body: Knee     Time: 4132-4401 PT Time Calculation (min) (ACUTE ONLY): 16 min  Charges:  $Gait Training: 8-22 mins                    Olga Coaster PT, DPT 9:33 AM,01/27/23

## 2023-01-27 NOTE — Progress Notes (Signed)
DISCHARGE NOTE:  Pt given discharge instructions and verbalized understanding. TED hose on both legs. Pt wheeled to car by staff. Daughter providing transportation.

## 2023-01-27 NOTE — Discharge Instructions (Signed)
 Instructions after Total Knee Replacement   Zachary Aberman M.D.     Dept. of Orthopaedics & Sports Medicine  [facility]  [address]  [place], [place]  [address]  Phone: [phone]   Fax: [phone]    DIET: Drink plenty of non-alcoholic fluids. Resume your normal diet. Include foods high in fiber.  ACTIVITY:  You may use crutches or a walker with weight-bearing as tolerated, unless instructed otherwise. You may be weaned off of the walker or crutches by your Physical Therapist.  Do NOT place pillows under the knee. Anything placed under the knee could limit your ability to straighten the knee.   Continue doing gentle exercises. Exercising will reduce the pain and swelling, increase motion, and prevent muscle weakness.   Please continue to use the TED compression stockings for 2 weeks. You may remove the stockings at night, but should reapply them in the morning. Do not drive or operate any equipment until instructed.  WOUND CARE:  Continue to use the PolarCare or ice packs periodically to reduce pain and swelling. You may begin showering 3 days after surgery with honeycomb dressing. Remove honeycomb dressing 7 days after surgery and continue showering. Allow dermabond to fall off on its own.  MEDICATIONS: You may resume your regular medications. Please take the pain medication as prescribed on the medication. Do not take pain medication on an empty stomach. You have been given a prescription for a blood thinner (Lovenox or Coumadin). Please take the medication as instructed. (NOTE: After completing a 2 week course of Lovenox, take one 81 mg Enteric-coated aspirin twice a day for 3 additional weeks. This along with elevation will help reduce the possibility of phlebitis in your operated leg.) Do not drive or drink alcoholic beverages when taking pain medications.  POSTOPERATIVE CONSTIPATION PROTOCOL Constipation - defined medically as fewer than three stools per  week and severe constipation as less than one stool per week.  One of the most common issues patients have following surgery is constipation.  Even if you have a regular bowel pattern at home, your normal regimen is likely to be disrupted due to multiple reasons following surgery.  Combination of anesthesia, postoperative narcotics, change in appetite and fluid intake all can affect your bowels.  In order to avoid complications following surgery, here are some recommendations in order to help you during your recovery period.  Colace (docusate) - Pick up an over-the-counter form of Colace or another stool softener and take twice a day as long as you are requiring postoperative pain medications.  Take with a full glass of water daily.  If you experience loose stools or diarrhea, hold the colace until you stool forms back up.  If your symptoms do not get better within 1 week or if they get worse, check with your doctor.  Dulcolax (bisacodyl) - Pick up over-the-counter and take as directed by the product packaging as needed to assist with the movement of your bowels.  Take with a full glass of water.  Use this product as needed if not relieved by Colace only.   MiraLax (polyethylene glycol) - Pick up over-the-counter to have on hand.  MiraLax is a solution that will increase the amount of water in your bowels to assist with bowel movements.  Take as directed and can mix with a glass of water, juice, soda, coffee, or tea.  Take if you go more than two days without a movement. Do not use MiraLax more than once per day.   Call your doctor if you are still constipated or irregular after using this medication for 7 days in a row.  If you continue to have problems with postoperative constipation, please contact the office for further assistance and recommendations.  If you experience "the worst abdominal pain ever" or develop nausea or vomiting, please contact the office immediatly for further recommendations for  treatment.   CALL THE OFFICE FOR: Temperature above 101 degrees Excessive bleeding or drainage on the dressing. Excessive swelling, coldness, or paleness of the toes. Persistent nausea and vomiting.  FOLLOW-UP:  You should have an appointment to return to the office in 14 days after surgery. Arrangements have been made for continuation of Physical Therapy (either home therapy or outpatient therapy).  

## 2023-01-27 NOTE — Progress Notes (Addendum)
Subjective: 1 Day Post-Op Procedure(s) (LRB): TOTAL KNEE ARTHROPLASTY (Left) Patient reports pain as mild.   Patient is well, and has had no acute complaints or problems Denies any CP, SOB, ABD pain. We will continue therapy today.  Plan is to go Home after hospital stay.  Objective: Vital signs in last 24 hours: Temp:  [96.8 F (36 C)-98 F (36.7 C)] 96.8 F (36 C) (04/23 0546) Pulse Rate:  [53-91] 53 (04/23 0546) Resp:  [10-20] 18 (04/23 0546) BP: (117-147)/(66-89) 145/71 (04/23 0546) SpO2:  [96 %-100 %] 97 % (04/23 0546)  Intake/Output from previous day: 04/22 0701 - 04/23 0700 In: 2071.7 [I.V.:1774.9; IV Piggyback:296.8] Out: 1200 [Urine:1150; Blood:50] Intake/Output this shift: No intake/output data recorded.  Recent Labs    01/27/23 0556  HGB 11.4*   Recent Labs    01/27/23 0556  WBC 12.4*  RBC 3.81*  HCT 34.2*  PLT 189   Recent Labs    01/27/23 0556  NA 138  K 4.3  CL 106  CO2 26  BUN 21  CREATININE 0.93  GLUCOSE 120*  CALCIUM 8.7*   No results for input(s): "LABPT", "INR" in the last 72 hours.  EXAM General - Patient is Alert, Appropriate, and Oriented Extremity - Neurovascular intact Sensation intact distally Intact pulses distally Dorsiflexion/Plantar flexion intact No cellulitis present Compartment soft Dressing - dressing C/D/I and no drainage Motor Function - intact, moving foot and toes well on exam.   Past Medical History:  Diagnosis Date   Allergic rhinitis    Aortic atherosclerosis    Arthritis    CAD (coronary artery disease) 06/06/2021   a.) STEMI --> LHC: EF 55-65%, 100% RI --> PCI performed placing a 2.5 x 18 mm Onyx Frontier DES   Cellulitis and abscess    Diastolic dysfunction 06/07/2021   a.) TTE 06/07/2021: EF 55-60%, no RWMAs, triv MR, mild AoV sclerosis with no stenosis, G1DD; b.) TTE 07/17/2021: EF >55%, mild LVH, no RWMAs; triv TR, mild MR, G1DD   Diverticulosis    Enlarged heart    Eyelid inflammation     GERD (gastroesophageal reflux disease)    Hematuria syndrome    Hemorrhoids    Herpes genitalis    Herpes simplex    HTN (hypertension)    Hydronephrosis    Hypothyroidism    Incomplete bladder emptying    Internal hordeolum    Long term current use of antithrombotics/antiplatelets    a.) on daily DAPT therapy (ASA + ticagrelor) x 1 year s/p STEMI; ticagrelor has been discontinued   OSA on CPAP    Primary ovarian failure    Sciatica    Shortness of breath    Sinus bradycardia    STEMI (ST elevation myocardial infarction) 06/06/2021   a.) LHC/PCI 06/06/2021 --> 100% RI (2.5 x 18 mm Onyx Frontier DES)   Urethral diverticulum    Vaginal atrophy     Assessment/Plan:   1 Day Post-Op Procedure(s) (LRB): TOTAL KNEE ARTHROPLASTY (Left) Principal Problem:   S/P TKR (total knee replacement) using cement, left  Estimated body mass index is 34.05 kg/m as calculated from the following:   Height as of this encounter:  (1.499 m).   Weight as of this encounter: 76.5 kg. Advance diet Up with therapy Pain well controlled Labs and vital signs stable CM To assist with discharge to home with HHPT today pending safe completion of PT goals  DVT Prophylaxis - Lovenox, TED hose, and SCDS Weight-Bearing as tolerated to left leg  Lollie Marrow, PA-C [facility] 01/27/2023, 8:13 AM   Patient seen and examined, agree with above plan.  The patient is doing well status post left total knee arthroplasty, no concerns at this time.  Pain is controlled.  Discussed DVT prophylaxis, pain medication use, and safe transition to home.  All questions answered the patient agrees with above plan will go home after clears PT.   Reinaldo Berber MD

## 2023-01-27 NOTE — Progress Notes (Signed)
Patient is not able to walk the distance required to go the bathroom, or she is unable to safely negotiate stairs required to access the bathroom.  A 3in1 BSC will alleviate this problem.       T. Chris Jayme Mednick, PA-C [facility] 

## 2023-01-27 NOTE — Anesthesia Postprocedure Evaluation (Signed)
Anesthesia Post Note  Patient: Anda Sobotta Desmith  Procedure(s) Performed: TOTAL KNEE ARTHROPLASTY (Left: Knee)  Patient location during evaluation: Nursing Unit Anesthesia Type: Spinal Level of consciousness: oriented and awake and alert Pain management: pain level controlled Vital Signs Assessment: post-procedure vital signs reviewed and stable Respiratory status: spontaneous breathing and respiratory function stable Cardiovascular status: blood pressure returned to baseline and stable Postop Assessment: no headache, no backache, no apparent nausea or vomiting and patient able to bend at knees Anesthetic complications: no   No notable events documented.   Last Vitals:  Vitals:   01/27/23 0033 01/27/23 0546  BP: 125/70 (!) 145/71  Pulse: 66 (!) 53  Resp: 16 18  Temp: 36.7 C (!) 36 C  SpO2: 96% 97%    Last Pain:  Vitals:   01/27/23 0546  TempSrc: Temporal  PainSc: 0-No pain                 Lynden Oxford

## 2023-01-28 DIAGNOSIS — K219 Gastro-esophageal reflux disease without esophagitis: Secondary | ICD-10-CM | POA: Diagnosis not present

## 2023-01-28 DIAGNOSIS — K5790 Diverticulosis of intestine, part unspecified, without perforation or abscess without bleeding: Secondary | ICD-10-CM | POA: Diagnosis not present

## 2023-01-28 DIAGNOSIS — T8131XD Disruption of external operation (surgical) wound, not elsewhere classified, subsequent encounter: Secondary | ICD-10-CM | POA: Diagnosis not present

## 2023-01-28 DIAGNOSIS — I10 Essential (primary) hypertension: Secondary | ICD-10-CM | POA: Diagnosis not present

## 2023-01-28 DIAGNOSIS — H25813 Combined forms of age-related cataract, bilateral: Secondary | ICD-10-CM | POA: Diagnosis not present

## 2023-01-28 DIAGNOSIS — I252 Old myocardial infarction: Secondary | ICD-10-CM | POA: Diagnosis not present

## 2023-01-28 DIAGNOSIS — Z86010 Personal history of colonic polyps: Secondary | ICD-10-CM | POA: Diagnosis not present

## 2023-01-28 DIAGNOSIS — G4733 Obstructive sleep apnea (adult) (pediatric): Secondary | ICD-10-CM | POA: Diagnosis not present

## 2023-01-28 DIAGNOSIS — H1045 Other chronic allergic conjunctivitis: Secondary | ICD-10-CM | POA: Diagnosis not present

## 2023-01-28 DIAGNOSIS — M5430 Sciatica, unspecified side: Secondary | ICD-10-CM | POA: Diagnosis not present

## 2023-01-28 DIAGNOSIS — Z955 Presence of coronary angioplasty implant and graft: Secondary | ICD-10-CM | POA: Diagnosis not present

## 2023-01-28 DIAGNOSIS — E538 Deficiency of other specified B group vitamins: Secondary | ICD-10-CM | POA: Diagnosis not present

## 2023-01-28 DIAGNOSIS — E559 Vitamin D deficiency, unspecified: Secondary | ICD-10-CM | POA: Diagnosis not present

## 2023-01-28 DIAGNOSIS — N1330 Unspecified hydronephrosis: Secondary | ICD-10-CM | POA: Diagnosis not present

## 2023-01-28 DIAGNOSIS — M1711 Unilateral primary osteoarthritis, right knee: Secondary | ICD-10-CM | POA: Diagnosis not present

## 2023-01-28 DIAGNOSIS — J309 Allergic rhinitis, unspecified: Secondary | ICD-10-CM | POA: Diagnosis not present

## 2023-01-28 DIAGNOSIS — Z7901 Long term (current) use of anticoagulants: Secondary | ICD-10-CM | POA: Diagnosis not present

## 2023-01-28 DIAGNOSIS — G8929 Other chronic pain: Secondary | ICD-10-CM | POA: Diagnosis not present

## 2023-01-28 DIAGNOSIS — Z96652 Presence of left artificial knee joint: Secondary | ICD-10-CM | POA: Diagnosis not present

## 2023-01-28 DIAGNOSIS — I2510 Atherosclerotic heart disease of native coronary artery without angina pectoris: Secondary | ICD-10-CM | POA: Diagnosis not present

## 2023-01-28 DIAGNOSIS — E039 Hypothyroidism, unspecified: Secondary | ICD-10-CM | POA: Diagnosis not present

## 2023-01-28 DIAGNOSIS — Z7982 Long term (current) use of aspirin: Secondary | ICD-10-CM | POA: Diagnosis not present

## 2023-01-28 DIAGNOSIS — E785 Hyperlipidemia, unspecified: Secondary | ICD-10-CM | POA: Diagnosis not present

## 2023-01-30 DIAGNOSIS — M1711 Unilateral primary osteoarthritis, right knee: Secondary | ICD-10-CM | POA: Diagnosis not present

## 2023-01-30 DIAGNOSIS — Z7901 Long term (current) use of anticoagulants: Secondary | ICD-10-CM | POA: Diagnosis not present

## 2023-01-30 DIAGNOSIS — I2510 Atherosclerotic heart disease of native coronary artery without angina pectoris: Secondary | ICD-10-CM | POA: Diagnosis not present

## 2023-01-30 DIAGNOSIS — E559 Vitamin D deficiency, unspecified: Secondary | ICD-10-CM | POA: Diagnosis not present

## 2023-01-30 DIAGNOSIS — Z7982 Long term (current) use of aspirin: Secondary | ICD-10-CM | POA: Diagnosis not present

## 2023-01-30 DIAGNOSIS — J309 Allergic rhinitis, unspecified: Secondary | ICD-10-CM | POA: Diagnosis not present

## 2023-01-30 DIAGNOSIS — E538 Deficiency of other specified B group vitamins: Secondary | ICD-10-CM | POA: Diagnosis not present

## 2023-01-30 DIAGNOSIS — Z955 Presence of coronary angioplasty implant and graft: Secondary | ICD-10-CM | POA: Diagnosis not present

## 2023-01-30 DIAGNOSIS — M5430 Sciatica, unspecified side: Secondary | ICD-10-CM | POA: Diagnosis not present

## 2023-01-30 DIAGNOSIS — E785 Hyperlipidemia, unspecified: Secondary | ICD-10-CM | POA: Diagnosis not present

## 2023-01-30 DIAGNOSIS — T8131XD Disruption of external operation (surgical) wound, not elsewhere classified, subsequent encounter: Secondary | ICD-10-CM | POA: Diagnosis not present

## 2023-01-30 DIAGNOSIS — G8929 Other chronic pain: Secondary | ICD-10-CM | POA: Diagnosis not present

## 2023-01-30 DIAGNOSIS — I10 Essential (primary) hypertension: Secondary | ICD-10-CM | POA: Diagnosis not present

## 2023-01-30 DIAGNOSIS — H25813 Combined forms of age-related cataract, bilateral: Secondary | ICD-10-CM | POA: Diagnosis not present

## 2023-01-30 DIAGNOSIS — H1045 Other chronic allergic conjunctivitis: Secondary | ICD-10-CM | POA: Diagnosis not present

## 2023-01-30 DIAGNOSIS — K219 Gastro-esophageal reflux disease without esophagitis: Secondary | ICD-10-CM | POA: Diagnosis not present

## 2023-01-30 DIAGNOSIS — I252 Old myocardial infarction: Secondary | ICD-10-CM | POA: Diagnosis not present

## 2023-01-30 DIAGNOSIS — E039 Hypothyroidism, unspecified: Secondary | ICD-10-CM | POA: Diagnosis not present

## 2023-01-30 DIAGNOSIS — Z96652 Presence of left artificial knee joint: Secondary | ICD-10-CM | POA: Diagnosis not present

## 2023-01-30 DIAGNOSIS — K5790 Diverticulosis of intestine, part unspecified, without perforation or abscess without bleeding: Secondary | ICD-10-CM | POA: Diagnosis not present

## 2023-01-30 DIAGNOSIS — Z86010 Personal history of colonic polyps: Secondary | ICD-10-CM | POA: Diagnosis not present

## 2023-01-30 DIAGNOSIS — G4733 Obstructive sleep apnea (adult) (pediatric): Secondary | ICD-10-CM | POA: Diagnosis not present

## 2023-01-30 DIAGNOSIS — N1330 Unspecified hydronephrosis: Secondary | ICD-10-CM | POA: Diagnosis not present

## 2023-01-31 DIAGNOSIS — G8929 Other chronic pain: Secondary | ICD-10-CM | POA: Diagnosis not present

## 2023-01-31 DIAGNOSIS — M5430 Sciatica, unspecified side: Secondary | ICD-10-CM | POA: Diagnosis not present

## 2023-01-31 DIAGNOSIS — N1330 Unspecified hydronephrosis: Secondary | ICD-10-CM | POA: Diagnosis not present

## 2023-01-31 DIAGNOSIS — I2510 Atherosclerotic heart disease of native coronary artery without angina pectoris: Secondary | ICD-10-CM | POA: Diagnosis not present

## 2023-01-31 DIAGNOSIS — E785 Hyperlipidemia, unspecified: Secondary | ICD-10-CM | POA: Diagnosis not present

## 2023-01-31 DIAGNOSIS — I252 Old myocardial infarction: Secondary | ICD-10-CM | POA: Diagnosis not present

## 2023-01-31 DIAGNOSIS — M1711 Unilateral primary osteoarthritis, right knee: Secondary | ICD-10-CM | POA: Diagnosis not present

## 2023-01-31 DIAGNOSIS — J309 Allergic rhinitis, unspecified: Secondary | ICD-10-CM | POA: Diagnosis not present

## 2023-01-31 DIAGNOSIS — I10 Essential (primary) hypertension: Secondary | ICD-10-CM | POA: Diagnosis not present

## 2023-01-31 DIAGNOSIS — K5790 Diverticulosis of intestine, part unspecified, without perforation or abscess without bleeding: Secondary | ICD-10-CM | POA: Diagnosis not present

## 2023-01-31 DIAGNOSIS — G4733 Obstructive sleep apnea (adult) (pediatric): Secondary | ICD-10-CM | POA: Diagnosis not present

## 2023-01-31 DIAGNOSIS — Z7901 Long term (current) use of anticoagulants: Secondary | ICD-10-CM | POA: Diagnosis not present

## 2023-01-31 DIAGNOSIS — H1045 Other chronic allergic conjunctivitis: Secondary | ICD-10-CM | POA: Diagnosis not present

## 2023-01-31 DIAGNOSIS — H25813 Combined forms of age-related cataract, bilateral: Secondary | ICD-10-CM | POA: Diagnosis not present

## 2023-01-31 DIAGNOSIS — Z96652 Presence of left artificial knee joint: Secondary | ICD-10-CM | POA: Diagnosis not present

## 2023-01-31 DIAGNOSIS — K219 Gastro-esophageal reflux disease without esophagitis: Secondary | ICD-10-CM | POA: Diagnosis not present

## 2023-01-31 DIAGNOSIS — E039 Hypothyroidism, unspecified: Secondary | ICD-10-CM | POA: Diagnosis not present

## 2023-01-31 DIAGNOSIS — T8131XD Disruption of external operation (surgical) wound, not elsewhere classified, subsequent encounter: Secondary | ICD-10-CM | POA: Diagnosis not present

## 2023-01-31 DIAGNOSIS — E538 Deficiency of other specified B group vitamins: Secondary | ICD-10-CM | POA: Diagnosis not present

## 2023-01-31 DIAGNOSIS — E559 Vitamin D deficiency, unspecified: Secondary | ICD-10-CM | POA: Diagnosis not present

## 2023-01-31 DIAGNOSIS — Z955 Presence of coronary angioplasty implant and graft: Secondary | ICD-10-CM | POA: Diagnosis not present

## 2023-01-31 DIAGNOSIS — Z86010 Personal history of colonic polyps: Secondary | ICD-10-CM | POA: Diagnosis not present

## 2023-01-31 DIAGNOSIS — Z7982 Long term (current) use of aspirin: Secondary | ICD-10-CM | POA: Diagnosis not present

## 2023-02-02 DIAGNOSIS — I252 Old myocardial infarction: Secondary | ICD-10-CM | POA: Diagnosis not present

## 2023-02-02 DIAGNOSIS — J309 Allergic rhinitis, unspecified: Secondary | ICD-10-CM | POA: Diagnosis not present

## 2023-02-02 DIAGNOSIS — M5430 Sciatica, unspecified side: Secondary | ICD-10-CM | POA: Diagnosis not present

## 2023-02-02 DIAGNOSIS — M1711 Unilateral primary osteoarthritis, right knee: Secondary | ICD-10-CM | POA: Diagnosis not present

## 2023-02-02 DIAGNOSIS — I10 Essential (primary) hypertension: Secondary | ICD-10-CM | POA: Diagnosis not present

## 2023-02-02 DIAGNOSIS — H1045 Other chronic allergic conjunctivitis: Secondary | ICD-10-CM | POA: Diagnosis not present

## 2023-02-02 DIAGNOSIS — Z96652 Presence of left artificial knee joint: Secondary | ICD-10-CM | POA: Diagnosis not present

## 2023-02-02 DIAGNOSIS — H25813 Combined forms of age-related cataract, bilateral: Secondary | ICD-10-CM | POA: Diagnosis not present

## 2023-02-02 DIAGNOSIS — K5790 Diverticulosis of intestine, part unspecified, without perforation or abscess without bleeding: Secondary | ICD-10-CM | POA: Diagnosis not present

## 2023-02-02 DIAGNOSIS — E559 Vitamin D deficiency, unspecified: Secondary | ICD-10-CM | POA: Diagnosis not present

## 2023-02-02 DIAGNOSIS — N1330 Unspecified hydronephrosis: Secondary | ICD-10-CM | POA: Diagnosis not present

## 2023-02-02 DIAGNOSIS — K219 Gastro-esophageal reflux disease without esophagitis: Secondary | ICD-10-CM | POA: Diagnosis not present

## 2023-02-02 DIAGNOSIS — Z955 Presence of coronary angioplasty implant and graft: Secondary | ICD-10-CM | POA: Diagnosis not present

## 2023-02-02 DIAGNOSIS — G4733 Obstructive sleep apnea (adult) (pediatric): Secondary | ICD-10-CM | POA: Diagnosis not present

## 2023-02-02 DIAGNOSIS — E785 Hyperlipidemia, unspecified: Secondary | ICD-10-CM | POA: Diagnosis not present

## 2023-02-02 DIAGNOSIS — G8929 Other chronic pain: Secondary | ICD-10-CM | POA: Diagnosis not present

## 2023-02-02 DIAGNOSIS — Z7982 Long term (current) use of aspirin: Secondary | ICD-10-CM | POA: Diagnosis not present

## 2023-02-02 DIAGNOSIS — E039 Hypothyroidism, unspecified: Secondary | ICD-10-CM | POA: Diagnosis not present

## 2023-02-02 DIAGNOSIS — T8131XD Disruption of external operation (surgical) wound, not elsewhere classified, subsequent encounter: Secondary | ICD-10-CM | POA: Diagnosis not present

## 2023-02-02 DIAGNOSIS — E538 Deficiency of other specified B group vitamins: Secondary | ICD-10-CM | POA: Diagnosis not present

## 2023-02-02 DIAGNOSIS — Z7901 Long term (current) use of anticoagulants: Secondary | ICD-10-CM | POA: Diagnosis not present

## 2023-02-02 DIAGNOSIS — I2510 Atherosclerotic heart disease of native coronary artery without angina pectoris: Secondary | ICD-10-CM | POA: Diagnosis not present

## 2023-02-02 DIAGNOSIS — Z86010 Personal history of colonic polyps: Secondary | ICD-10-CM | POA: Diagnosis not present

## 2023-02-04 DIAGNOSIS — E039 Hypothyroidism, unspecified: Secondary | ICD-10-CM | POA: Diagnosis not present

## 2023-02-04 DIAGNOSIS — Z955 Presence of coronary angioplasty implant and graft: Secondary | ICD-10-CM | POA: Diagnosis not present

## 2023-02-04 DIAGNOSIS — N1330 Unspecified hydronephrosis: Secondary | ICD-10-CM | POA: Diagnosis not present

## 2023-02-04 DIAGNOSIS — H1045 Other chronic allergic conjunctivitis: Secondary | ICD-10-CM | POA: Diagnosis not present

## 2023-02-04 DIAGNOSIS — E559 Vitamin D deficiency, unspecified: Secondary | ICD-10-CM | POA: Diagnosis not present

## 2023-02-04 DIAGNOSIS — I2510 Atherosclerotic heart disease of native coronary artery without angina pectoris: Secondary | ICD-10-CM | POA: Diagnosis not present

## 2023-02-04 DIAGNOSIS — K5790 Diverticulosis of intestine, part unspecified, without perforation or abscess without bleeding: Secondary | ICD-10-CM | POA: Diagnosis not present

## 2023-02-04 DIAGNOSIS — E538 Deficiency of other specified B group vitamins: Secondary | ICD-10-CM | POA: Diagnosis not present

## 2023-02-04 DIAGNOSIS — Z96652 Presence of left artificial knee joint: Secondary | ICD-10-CM | POA: Diagnosis not present

## 2023-02-04 DIAGNOSIS — G8929 Other chronic pain: Secondary | ICD-10-CM | POA: Diagnosis not present

## 2023-02-04 DIAGNOSIS — M1711 Unilateral primary osteoarthritis, right knee: Secondary | ICD-10-CM | POA: Diagnosis not present

## 2023-02-04 DIAGNOSIS — K219 Gastro-esophageal reflux disease without esophagitis: Secondary | ICD-10-CM | POA: Diagnosis not present

## 2023-02-04 DIAGNOSIS — I252 Old myocardial infarction: Secondary | ICD-10-CM | POA: Diagnosis not present

## 2023-02-04 DIAGNOSIS — Z7901 Long term (current) use of anticoagulants: Secondary | ICD-10-CM | POA: Diagnosis not present

## 2023-02-04 DIAGNOSIS — J309 Allergic rhinitis, unspecified: Secondary | ICD-10-CM | POA: Diagnosis not present

## 2023-02-04 DIAGNOSIS — M5430 Sciatica, unspecified side: Secondary | ICD-10-CM | POA: Diagnosis not present

## 2023-02-04 DIAGNOSIS — G4733 Obstructive sleep apnea (adult) (pediatric): Secondary | ICD-10-CM | POA: Diagnosis not present

## 2023-02-04 DIAGNOSIS — Z86010 Personal history of colonic polyps: Secondary | ICD-10-CM | POA: Diagnosis not present

## 2023-02-04 DIAGNOSIS — H25813 Combined forms of age-related cataract, bilateral: Secondary | ICD-10-CM | POA: Diagnosis not present

## 2023-02-04 DIAGNOSIS — E785 Hyperlipidemia, unspecified: Secondary | ICD-10-CM | POA: Diagnosis not present

## 2023-02-04 DIAGNOSIS — T8131XD Disruption of external operation (surgical) wound, not elsewhere classified, subsequent encounter: Secondary | ICD-10-CM | POA: Diagnosis not present

## 2023-02-04 DIAGNOSIS — I10 Essential (primary) hypertension: Secondary | ICD-10-CM | POA: Diagnosis not present

## 2023-02-04 DIAGNOSIS — Z7982 Long term (current) use of aspirin: Secondary | ICD-10-CM | POA: Diagnosis not present

## 2023-02-06 DIAGNOSIS — M5430 Sciatica, unspecified side: Secondary | ICD-10-CM | POA: Diagnosis not present

## 2023-02-06 DIAGNOSIS — H25813 Combined forms of age-related cataract, bilateral: Secondary | ICD-10-CM | POA: Diagnosis not present

## 2023-02-06 DIAGNOSIS — K219 Gastro-esophageal reflux disease without esophagitis: Secondary | ICD-10-CM | POA: Diagnosis not present

## 2023-02-06 DIAGNOSIS — Z96652 Presence of left artificial knee joint: Secondary | ICD-10-CM | POA: Diagnosis not present

## 2023-02-06 DIAGNOSIS — Z86010 Personal history of colonic polyps: Secondary | ICD-10-CM | POA: Diagnosis not present

## 2023-02-06 DIAGNOSIS — E785 Hyperlipidemia, unspecified: Secondary | ICD-10-CM | POA: Diagnosis not present

## 2023-02-06 DIAGNOSIS — E559 Vitamin D deficiency, unspecified: Secondary | ICD-10-CM | POA: Diagnosis not present

## 2023-02-06 DIAGNOSIS — H1045 Other chronic allergic conjunctivitis: Secondary | ICD-10-CM | POA: Diagnosis not present

## 2023-02-06 DIAGNOSIS — Z7901 Long term (current) use of anticoagulants: Secondary | ICD-10-CM | POA: Diagnosis not present

## 2023-02-06 DIAGNOSIS — G8929 Other chronic pain: Secondary | ICD-10-CM | POA: Diagnosis not present

## 2023-02-06 DIAGNOSIS — E039 Hypothyroidism, unspecified: Secondary | ICD-10-CM | POA: Diagnosis not present

## 2023-02-06 DIAGNOSIS — Z7982 Long term (current) use of aspirin: Secondary | ICD-10-CM | POA: Diagnosis not present

## 2023-02-06 DIAGNOSIS — T8131XD Disruption of external operation (surgical) wound, not elsewhere classified, subsequent encounter: Secondary | ICD-10-CM | POA: Diagnosis not present

## 2023-02-06 DIAGNOSIS — Z955 Presence of coronary angioplasty implant and graft: Secondary | ICD-10-CM | POA: Diagnosis not present

## 2023-02-06 DIAGNOSIS — K5790 Diverticulosis of intestine, part unspecified, without perforation or abscess without bleeding: Secondary | ICD-10-CM | POA: Diagnosis not present

## 2023-02-06 DIAGNOSIS — I252 Old myocardial infarction: Secondary | ICD-10-CM | POA: Diagnosis not present

## 2023-02-06 DIAGNOSIS — I2510 Atherosclerotic heart disease of native coronary artery without angina pectoris: Secondary | ICD-10-CM | POA: Diagnosis not present

## 2023-02-06 DIAGNOSIS — I10 Essential (primary) hypertension: Secondary | ICD-10-CM | POA: Diagnosis not present

## 2023-02-06 DIAGNOSIS — M1711 Unilateral primary osteoarthritis, right knee: Secondary | ICD-10-CM | POA: Diagnosis not present

## 2023-02-06 DIAGNOSIS — E538 Deficiency of other specified B group vitamins: Secondary | ICD-10-CM | POA: Diagnosis not present

## 2023-02-06 DIAGNOSIS — N1330 Unspecified hydronephrosis: Secondary | ICD-10-CM | POA: Diagnosis not present

## 2023-02-06 DIAGNOSIS — G4733 Obstructive sleep apnea (adult) (pediatric): Secondary | ICD-10-CM | POA: Diagnosis not present

## 2023-02-06 DIAGNOSIS — J309 Allergic rhinitis, unspecified: Secondary | ICD-10-CM | POA: Diagnosis not present

## 2023-02-09 DIAGNOSIS — G4733 Obstructive sleep apnea (adult) (pediatric): Secondary | ICD-10-CM | POA: Diagnosis not present

## 2023-02-09 DIAGNOSIS — Z86010 Personal history of colonic polyps: Secondary | ICD-10-CM | POA: Diagnosis not present

## 2023-02-09 DIAGNOSIS — I2510 Atherosclerotic heart disease of native coronary artery without angina pectoris: Secondary | ICD-10-CM | POA: Diagnosis not present

## 2023-02-09 DIAGNOSIS — M1711 Unilateral primary osteoarthritis, right knee: Secondary | ICD-10-CM | POA: Diagnosis not present

## 2023-02-09 DIAGNOSIS — N1330 Unspecified hydronephrosis: Secondary | ICD-10-CM | POA: Diagnosis not present

## 2023-02-09 DIAGNOSIS — Z7982 Long term (current) use of aspirin: Secondary | ICD-10-CM | POA: Diagnosis not present

## 2023-02-09 DIAGNOSIS — I252 Old myocardial infarction: Secondary | ICD-10-CM | POA: Diagnosis not present

## 2023-02-09 DIAGNOSIS — I10 Essential (primary) hypertension: Secondary | ICD-10-CM | POA: Diagnosis not present

## 2023-02-09 DIAGNOSIS — K5790 Diverticulosis of intestine, part unspecified, without perforation or abscess without bleeding: Secondary | ICD-10-CM | POA: Diagnosis not present

## 2023-02-09 DIAGNOSIS — E559 Vitamin D deficiency, unspecified: Secondary | ICD-10-CM | POA: Diagnosis not present

## 2023-02-09 DIAGNOSIS — K219 Gastro-esophageal reflux disease without esophagitis: Secondary | ICD-10-CM | POA: Diagnosis not present

## 2023-02-09 DIAGNOSIS — H25813 Combined forms of age-related cataract, bilateral: Secondary | ICD-10-CM | POA: Diagnosis not present

## 2023-02-09 DIAGNOSIS — Z955 Presence of coronary angioplasty implant and graft: Secondary | ICD-10-CM | POA: Diagnosis not present

## 2023-02-09 DIAGNOSIS — M5430 Sciatica, unspecified side: Secondary | ICD-10-CM | POA: Diagnosis not present

## 2023-02-09 DIAGNOSIS — H1045 Other chronic allergic conjunctivitis: Secondary | ICD-10-CM | POA: Diagnosis not present

## 2023-02-09 DIAGNOSIS — Z7901 Long term (current) use of anticoagulants: Secondary | ICD-10-CM | POA: Diagnosis not present

## 2023-02-09 DIAGNOSIS — Z96652 Presence of left artificial knee joint: Secondary | ICD-10-CM | POA: Diagnosis not present

## 2023-02-09 DIAGNOSIS — E538 Deficiency of other specified B group vitamins: Secondary | ICD-10-CM | POA: Diagnosis not present

## 2023-02-09 DIAGNOSIS — E039 Hypothyroidism, unspecified: Secondary | ICD-10-CM | POA: Diagnosis not present

## 2023-02-09 DIAGNOSIS — G8929 Other chronic pain: Secondary | ICD-10-CM | POA: Diagnosis not present

## 2023-02-09 DIAGNOSIS — T8131XD Disruption of external operation (surgical) wound, not elsewhere classified, subsequent encounter: Secondary | ICD-10-CM | POA: Diagnosis not present

## 2023-02-09 DIAGNOSIS — J309 Allergic rhinitis, unspecified: Secondary | ICD-10-CM | POA: Diagnosis not present

## 2023-02-09 DIAGNOSIS — E785 Hyperlipidemia, unspecified: Secondary | ICD-10-CM | POA: Diagnosis not present

## 2023-02-10 DIAGNOSIS — Z96652 Presence of left artificial knee joint: Secondary | ICD-10-CM | POA: Diagnosis not present

## 2023-02-10 DIAGNOSIS — M25562 Pain in left knee: Secondary | ICD-10-CM | POA: Diagnosis not present

## 2023-02-11 DIAGNOSIS — M5430 Sciatica, unspecified side: Secondary | ICD-10-CM | POA: Diagnosis not present

## 2023-02-11 DIAGNOSIS — I2510 Atherosclerotic heart disease of native coronary artery without angina pectoris: Secondary | ICD-10-CM | POA: Diagnosis not present

## 2023-02-11 DIAGNOSIS — T8131XD Disruption of external operation (surgical) wound, not elsewhere classified, subsequent encounter: Secondary | ICD-10-CM | POA: Diagnosis not present

## 2023-02-11 DIAGNOSIS — I10 Essential (primary) hypertension: Secondary | ICD-10-CM | POA: Diagnosis not present

## 2023-02-11 DIAGNOSIS — K5790 Diverticulosis of intestine, part unspecified, without perforation or abscess without bleeding: Secondary | ICD-10-CM | POA: Diagnosis not present

## 2023-02-11 DIAGNOSIS — M1711 Unilateral primary osteoarthritis, right knee: Secondary | ICD-10-CM | POA: Diagnosis not present

## 2023-02-11 DIAGNOSIS — G8929 Other chronic pain: Secondary | ICD-10-CM | POA: Diagnosis not present

## 2023-02-12 DIAGNOSIS — Z96652 Presence of left artificial knee joint: Secondary | ICD-10-CM | POA: Diagnosis not present

## 2023-02-12 DIAGNOSIS — M25562 Pain in left knee: Secondary | ICD-10-CM | POA: Diagnosis not present

## 2023-02-17 DIAGNOSIS — Z96652 Presence of left artificial knee joint: Secondary | ICD-10-CM | POA: Diagnosis not present

## 2023-02-19 DIAGNOSIS — J3089 Other allergic rhinitis: Secondary | ICD-10-CM | POA: Diagnosis not present

## 2023-02-19 DIAGNOSIS — J3081 Allergic rhinitis due to animal (cat) (dog) hair and dander: Secondary | ICD-10-CM | POA: Diagnosis not present

## 2023-02-19 DIAGNOSIS — J301 Allergic rhinitis due to pollen: Secondary | ICD-10-CM | POA: Diagnosis not present

## 2023-02-20 DIAGNOSIS — M25562 Pain in left knee: Secondary | ICD-10-CM | POA: Diagnosis not present

## 2023-02-20 DIAGNOSIS — Z96652 Presence of left artificial knee joint: Secondary | ICD-10-CM | POA: Diagnosis not present

## 2023-02-23 ENCOUNTER — Other Ambulatory Visit: Payer: Self-pay | Admitting: Nurse Practitioner

## 2023-02-23 DIAGNOSIS — E782 Mixed hyperlipidemia: Secondary | ICD-10-CM

## 2023-02-26 DIAGNOSIS — J3089 Other allergic rhinitis: Secondary | ICD-10-CM | POA: Diagnosis not present

## 2023-02-26 DIAGNOSIS — J3081 Allergic rhinitis due to animal (cat) (dog) hair and dander: Secondary | ICD-10-CM | POA: Diagnosis not present

## 2023-02-26 DIAGNOSIS — J301 Allergic rhinitis due to pollen: Secondary | ICD-10-CM | POA: Diagnosis not present

## 2023-03-05 ENCOUNTER — Ambulatory Visit (INDEPENDENT_AMBULATORY_CARE_PROVIDER_SITE_OTHER): Payer: 59 | Admitting: Nurse Practitioner

## 2023-03-05 ENCOUNTER — Encounter: Payer: Self-pay | Admitting: Nurse Practitioner

## 2023-03-05 VITALS — BP 130/80 | HR 66 | Temp 98.2°F | Resp 16 | Ht 59.0 in | Wt 166.4 lb

## 2023-03-05 DIAGNOSIS — E039 Hypothyroidism, unspecified: Secondary | ICD-10-CM | POA: Diagnosis not present

## 2023-03-05 DIAGNOSIS — E782 Mixed hyperlipidemia: Secondary | ICD-10-CM | POA: Diagnosis not present

## 2023-03-05 DIAGNOSIS — K1230 Oral mucositis (ulcerative), unspecified: Secondary | ICD-10-CM | POA: Diagnosis not present

## 2023-03-05 DIAGNOSIS — K121 Other forms of stomatitis: Secondary | ICD-10-CM | POA: Diagnosis not present

## 2023-03-05 DIAGNOSIS — I10 Essential (primary) hypertension: Secondary | ICD-10-CM | POA: Diagnosis not present

## 2023-03-05 MED ORDER — METOPROLOL TARTRATE 25 MG PO TABS
25.0000 mg | ORAL_TABLET | Freq: Two times a day (BID) | ORAL | 1 refills | Status: DC
Start: 2023-03-05 — End: 2023-08-05

## 2023-03-05 MED ORDER — LOSARTAN POTASSIUM 25 MG PO TABS
25.0000 mg | ORAL_TABLET | Freq: Two times a day (BID) | ORAL | 1 refills | Status: DC
Start: 2023-03-05 — End: 2023-08-05

## 2023-03-05 MED ORDER — LEVOTHYROXINE SODIUM 50 MCG PO TABS
ORAL_TABLET | ORAL | 1 refills | Status: DC
Start: 2023-03-05 — End: 2023-08-05

## 2023-03-05 MED ORDER — CHLORHEXIDINE GLUCONATE 0.12 % MT SOLN
OROMUCOSAL | 1 refills | Status: DC
Start: 2023-03-05 — End: 2023-08-05

## 2023-03-05 NOTE — Progress Notes (Signed)
[facility] [address] [place], [place] [address]  Internal MEDICINE  Office Visit Note  Patient Name: Claudia Martin  [id number]  [id number]  Date of Service: 03/05/2023  Chief Complaint  Patient presents with   Follow-up   Hypertension   Hypothyroidism    HPI Claudia Martin presents for a follow-up visit for high cholesterol, hypertension,  Hyperlipidemia -- LDL 64, has history of MI, taking rosuvastatin 5 mg daily. Most recent lipid panel was done in march this year.  BP better controlled with increased losartan-- is now taking 50 mg twice daily. Also still taking metoprolol.  Hypothyroidism -- taking levothyroxine.     Current Medication: Outpatient Encounter Medications as of 03/05/2023  Medication Sig   acetaminophen (TYLENOL) 500 MG tablet Take 2 tablets (1,000 mg total) by mouth every 8 (eight) hours.   acyclovir ointment (ZOVIRAX) 5 % Apply topically.   aspirin 81 MG chewable tablet Chew 1 tablet (81 mg total) by mouth daily.   cetirizine (ZYRTEC) 10 MG tablet Take 10 mg by mouth daily.   chlorhexidine (PERIDEX) 0.12 % solution USE 5 ML AS DIRECTED IN THE MOUTH OR THROAT TWICE DAILY   EPINEPHrine 0.3 mg/0.3 mL IJ SOAJ injection as needed.   levothyroxine (SYNTHROID) 50 MCG tablet TAKE 1 TABLET(50 MCG) BY MOUTH DAILY BEFORE BREAKFAST   losartan (COZAAR) 25 MG tablet Take 1 tablet (25 mg total) by mouth 2 (two) times daily.   metoprolol tartrate (LOPRESSOR) 25 MG tablet Take 1 tablet (25 mg total) by mouth 2 (two) times daily.   nitroGLYCERIN (NITROSTAT) 0.4 MG SL tablet Place 1 tablet (0.4 mg total) under the tongue every 5 (five) minutes x 3 doses as needed for chest pain.   rosuvastatin (CRESTOR) 5 MG tablet TAKE 1 TABLET(5 MG) BY MOUTH DAILY   traMADol (ULTRAM) 50 MG tablet Take 1 tablet (50 mg total) by mouth every 6 (six) hours as needed for moderate pain.   valACYclovir (VALTREX) 1000 MG tablet TAKE 1 TABLET BY MOUTH DAILY. CAN TAKE 1 TABLET BY MOUTH  TWICE DAILY IF FLARE UP FOR 7 DAYS   [DISCONTINUED] chlorhexidine (PERIDEX) 0.12 % solution USE 5 ML AS DIRECTED IN THE MOUTH OR THROAT TWICE DAILY (Patient taking differently: Use as directed 5 mLs in the mouth or throat 2 (two) times daily as needed. USE 5 ML AS DIRECTED IN THE MOUTH OR THROAT TWICE DAILY)   [DISCONTINUED] docusate sodium (COLACE) 100 MG capsule Take 1 capsule (100 mg total) by mouth 2 (two) times daily.   [DISCONTINUED] enoxaparin (LOVENOX) 40 MG/0.4ML injection Inject 0.4 mLs (40 mg total) into the skin daily for 14 days.   [DISCONTINUED] levothyroxine (SYNTHROID) 50 MCG tablet TAKE 1 TABLET(50 MCG) BY MOUTH DAILY BEFORE AND BREAKFAST   [DISCONTINUED] losartan (COZAAR) 25 MG tablet Take 1 tablet by mouth daily.   [DISCONTINUED] metoprolol tartrate (LOPRESSOR) 25 MG tablet Take 1 tablet (25 mg total) by mouth 2 (two) times daily.   No facility-administered encounter medications on file as of 03/05/2023.    Surgical History: Past Surgical History:  Procedure Laterality Date   ABDOMINAL WALL DEFECT REPAIR N/A 05/27/2022   Procedure: REPAIR ABDOMINAL WALL, reconstruction;  Surgeon: Leafy Ro, MD;  Location: [facility];  Service: General;  Laterality: N/A;   BREAST BIOPSY Bilateral    benign   CATARACT EXTRACTION W/PHACO Right 06/05/2021   Procedure: CATARACT EXTRACTION PHACO AND INTRAOCULAR LENS PLACEMENT (IOC) RIGHT;  Surgeon: Lockie Mola, MD;  Location: [facility];  Service: Ophthalmology;  Laterality: Right;  3.02 00:39.5   CATARACT EXTRACTION W/PHACO Left 12/04/2021   Procedure: CATARACT EXTRACTION PHACO AND INTRAOCULAR LENS PLACEMENT (IOC) LEFT;  Surgeon: Lockie Mola, MD;  Location: [facility];  Service: Ophthalmology;  Laterality: Left;  6.23 00:48.0   COLON SURGERY     COLONOSCOPY WITH PROPOFOL N/A 03/15/2018   Procedure: COLONOSCOPY WITH PROPOFOL;  Surgeon: Toney Reil, MD;  Location: [facility];  Service:  Gastroenterology;  Laterality: N/A;   COLONOSCOPY WITH PROPOFOL N/A 01/09/2021   Procedure: COLONOSCOPY WITH PROPOFOL;  Surgeon: Toney Reil, MD;  Location: [facility];  Service: Gastroenterology;  Laterality: N/A;   CORONARY/GRAFT ACUTE MI REVASCULARIZATION N/A 06/06/2021   Procedure: Coronary/Graft Acute MI Revascularization;  Surgeon: Alwyn Pea, MD;  Location: [facility];  Service: Cardiovascular;  Laterality: N/A;   ECTOPIC PREGNANCY SURGERY     fx thumb     HERNIA REPAIR     INSERTION OF MESH N/A 05/27/2022   Procedure: INSERTION OF MESH;  Surgeon: Leafy Ro, MD;  Location: [facility];  Service: General;  Laterality: N/A;   LEFT HEART CATH AND CORONARY ANGIOGRAPHY N/A 06/06/2021   Procedure: LEFT HEART CATH AND CORONARY ANGIOGRAPHY;  Surgeon: Alwyn Pea, MD;  Location: [facility];  Service: Cardiovascular;  Laterality: N/A;   TOTAL KNEE ARTHROPLASTY Left 01/26/2023   Procedure: TOTAL KNEE ARTHROPLASTY;  Surgeon: Reinaldo Berber, MD;  Location: [facility];  Service: Orthopedics;  Laterality: Left;   VENTRAL HERNIA REPAIR N/A 05/27/2022   Procedure: HERNIA REPAIR VENTRAL ADULT, recurrent;  Surgeon: Leafy Ro, MD;  Location: [facility];  Service: General;  Laterality: N/A;    Medical History: Past Medical History:  Diagnosis Date   Allergic rhinitis    Aortic atherosclerosis (HCC)    Arthritis    CAD (coronary artery disease) 06/06/2021   a.) STEMI --> LHC: EF 55-65%, 100% RI --> PCI performed placing a 2.5 x 18 mm Onyx Frontier DES   Cellulitis and abscess    Diastolic dysfunction 06/07/2021   a.) TTE 06/07/2021: EF 55-60%, no RWMAs, triv MR, mild AoV sclerosis with no stenosis, G1DD; b.) TTE 07/17/2021: EF >55%, mild LVH, no RWMAs; triv TR, mild MR, G1DD   Diverticulosis    Enlarged heart    Eyelid inflammation    GERD (gastroesophageal reflux disease)    Hematuria syndrome    Hemorrhoids    Herpes genitalis    Herpes  simplex    HTN (hypertension)    Hydronephrosis    Hypothyroidism    Incomplete bladder emptying    Internal hordeolum    Long term current use of antithrombotics/antiplatelets    a.) on daily DAPT therapy (ASA + ticagrelor) x 1 year s/p STEMI; ticagrelor has been discontinued   OSA on CPAP    Primary ovarian failure    Sciatica    Shortness of breath    Sinus bradycardia    STEMI (ST elevation myocardial infarction) (HCC) 06/06/2021   a.) LHC/PCI 06/06/2021 --> 100% RI (2.5 x 18 mm Onyx Frontier DES)   Urethral diverticulum    Vaginal atrophy     Family History: Family History  Problem Relation Age of Onset   Tuberculosis Mother    Nephrolithiasis Brother    Vision loss Daughter    Hypertension Daughter     Social History   Socioeconomic History   Marital status: Divorced    Spouse name: Not on file   Number of  children: Not on file   Years of education: Not on file   Highest education level: Not on file  Occupational History   Not on file  Tobacco Use   Smoking status: Never    Passive exposure: Never   Smokeless tobacco: Never  Vaping Use   Vaping Use: Never used  Substance and Sexual Activity   Alcohol use: No   Drug use: Never   Sexual activity: Not on file  Other Topics Concern   Not on file  Social History Narrative   Not on file   Social Determinants of Health   Financial Resource Strain: Not on file  Food Insecurity: No Food Insecurity (01/26/2023)   Hunger Vital Sign    Worried About Running Out of Food in the Last Year: Never true    Ran Out of Food in the Last Year: Never true  Transportation Needs: No Transportation Needs (01/26/2023)   PRAPARE - Administrator, Civil Service (Medical): No    Lack of Transportation (Non-Medical): No  Physical Activity: Not on file  Stress: Not on file  Social Connections: Not on file  Intimate Partner Violence: Not At Risk (01/26/2023)   Humiliation, Afraid, Rape, and Kick questionnaire    Fear  of Current or Ex-Partner: No    Emotionally Abused: No    Physically Abused: No    Sexually Abused: No      Review of Systems  Constitutional:  Negative for chills, fatigue and unexpected weight change.  HENT:  Negative for congestion, rhinorrhea, sneezing and sore throat.   Eyes:  Negative for redness.  Respiratory: Negative.  Negative for cough, chest tightness, shortness of breath and wheezing.   Cardiovascular: Negative.  Negative for chest pain and palpitations.  Gastrointestinal:  Negative for abdominal pain, constipation, diarrhea, nausea and vomiting.  Genitourinary:  Negative for dysuria and frequency.  Musculoskeletal:  Negative for arthralgias, back pain, joint swelling and neck pain.  Skin:  Negative for rash.  Neurological: Negative.  Negative for tremors and numbness.  Hematological:  Negative for adenopathy. Does not bruise/bleed easily.  Psychiatric/Behavioral:  Negative for behavioral problems (Depression), sleep disturbance and suicidal ideas. The patient is not nervous/anxious.     Vital Signs: BP 130/80   Pulse 66   Temp 98.2 F (36.8 C)   Resp 16   Ht 4\' 11"  (1.499 m)   Wt 166 lb 6.4 oz (75.5 kg)   SpO2 98%   BMI 33.61 kg/m    Physical Exam Vitals reviewed.  Constitutional:      General: She is not in acute distress.    Appearance: Normal appearance. She is obese. She is not ill-appearing.  HENT:     Head: Normocephalic and atraumatic.  Eyes:     Pupils: Pupils are equal, round, and reactive to light.  Cardiovascular:     Rate and Rhythm: Normal rate and regular rhythm.  Pulmonary:     Effort: Pulmonary effort is normal. No respiratory distress.  Neurological:     Mental Status: She is alert and oriented to person, place, and time.  Psychiatric:        Mood and Affect: Mood normal.        Behavior: Behavior normal.        Assessment/Plan: 1. Essential hypertension Continue losartan and metoprolol as prescribed.  - losartan (COZAAR)  25 MG tablet; Take 1 tablet (25 mg total) by mouth 2 (two) times daily.  Dispense: 180 tablet; Refill: 1 - metoprolol tartrate (  LOPRESSOR) 25 MG tablet; Take 1 tablet (25 mg total) by mouth 2 (two) times daily.  Dispense: 180 tablet; Refill: 1  2. Mixed hyperlipidemia Repeat lipid panel prior to next office visit in October. Will assess if rosuvastatin dose needs to be increased to 10 mg then.  - Lipid Profile  3. Acquired hypothyroidism Continue levothyroxine as prescribed.  - levothyroxine (SYNTHROID) 50 MCG tablet; TAKE 1 TABLET(50 MCG) BY MOUTH DAILY BEFORE BREAKFAST  Dispense: 90 tablet; Refill: 1  4. Stomatitis and mucositis Continue chlorhexidine mouthwash as prescribed. - chlorhexidine (PERIDEX) 0.12 % solution; USE 5 ML AS DIRECTED IN THE MOUTH OR THROAT TWICE DAILY  Dispense: 473 mL; Refill: 1   General Counseling: Claudia Martin verbalizes understanding of the findings of todays visit and agrees with plan of treatment. I have discussed any further diagnostic evaluation that may be needed or ordered today. We also reviewed her medications today. she has been encouraged to call the office with any questions or concerns that should arise related to todays visit.    Orders Placed This Encounter  Procedures   Lipid Profile    Meds ordered this encounter  Medications   losartan (COZAAR) 25 MG tablet    Sig: Take 1 tablet (25 mg total) by mouth 2 (two) times daily.    Dispense:  180 tablet    Refill:  1   metoprolol tartrate (LOPRESSOR) 25 MG tablet    Sig: Take 1 tablet (25 mg total) by mouth 2 (two) times daily.    Dispense:  180 tablet    Refill:  1   levothyroxine (SYNTHROID) 50 MCG tablet    Sig: TAKE 1 TABLET(50 MCG) BY MOUTH DAILY BEFORE BREAKFAST    Dispense:  90 tablet    Refill:  1   chlorhexidine (PERIDEX) 0.12 % solution    Sig: USE 5 ML AS DIRECTED IN THE MOUTH OR THROAT TWICE DAILY    Dispense:  473 mL    Refill:  1    For future refills    Return in about 5  months (around 08/05/2023) for F/U, Claudia Martin PCP cholesterol.   Total time spent:30 Minutes Time spent includes review of chart, medications, test results, and follow up plan with the patient.    Controlled Substance Database was reviewed by me.  This patient was seen by Sallyanne Kuster, FNP-C in collaboration with Dr. Beverely Risen as a part of collaborative care agreement.   Claudia Lienhard R. Tedd Sias, MSN, FNP-C Internal medicine

## 2023-03-06 DIAGNOSIS — J3081 Allergic rhinitis due to animal (cat) (dog) hair and dander: Secondary | ICD-10-CM | POA: Diagnosis not present

## 2023-03-06 DIAGNOSIS — J3089 Other allergic rhinitis: Secondary | ICD-10-CM | POA: Diagnosis not present

## 2023-03-06 DIAGNOSIS — J301 Allergic rhinitis due to pollen: Secondary | ICD-10-CM | POA: Diagnosis not present

## 2023-03-10 DIAGNOSIS — Z96652 Presence of left artificial knee joint: Secondary | ICD-10-CM | POA: Diagnosis not present

## 2023-03-12 DIAGNOSIS — J3089 Other allergic rhinitis: Secondary | ICD-10-CM | POA: Diagnosis not present

## 2023-03-12 DIAGNOSIS — J3081 Allergic rhinitis due to animal (cat) (dog) hair and dander: Secondary | ICD-10-CM | POA: Diagnosis not present

## 2023-03-12 DIAGNOSIS — J301 Allergic rhinitis due to pollen: Secondary | ICD-10-CM | POA: Diagnosis not present

## 2023-03-18 ENCOUNTER — Telehealth: Payer: Self-pay | Admitting: Nurse Practitioner

## 2023-03-18 NOTE — Telephone Encounter (Addendum)
Lvm to move 05/27/23 appt to morning or to another afternoon-Toni

## 2023-03-19 DIAGNOSIS — J3081 Allergic rhinitis due to animal (cat) (dog) hair and dander: Secondary | ICD-10-CM | POA: Diagnosis not present

## 2023-03-19 DIAGNOSIS — J3089 Other allergic rhinitis: Secondary | ICD-10-CM | POA: Diagnosis not present

## 2023-03-19 DIAGNOSIS — J301 Allergic rhinitis due to pollen: Secondary | ICD-10-CM | POA: Diagnosis not present

## 2023-03-27 DIAGNOSIS — G4733 Obstructive sleep apnea (adult) (pediatric): Secondary | ICD-10-CM | POA: Diagnosis not present

## 2023-03-27 DIAGNOSIS — J3089 Other allergic rhinitis: Secondary | ICD-10-CM | POA: Diagnosis not present

## 2023-03-27 DIAGNOSIS — J301 Allergic rhinitis due to pollen: Secondary | ICD-10-CM | POA: Diagnosis not present

## 2023-03-27 DIAGNOSIS — J3081 Allergic rhinitis due to animal (cat) (dog) hair and dander: Secondary | ICD-10-CM | POA: Diagnosis not present

## 2023-04-03 DIAGNOSIS — J3089 Other allergic rhinitis: Secondary | ICD-10-CM | POA: Diagnosis not present

## 2023-04-03 DIAGNOSIS — J3081 Allergic rhinitis due to animal (cat) (dog) hair and dander: Secondary | ICD-10-CM | POA: Diagnosis not present

## 2023-04-03 DIAGNOSIS — J301 Allergic rhinitis due to pollen: Secondary | ICD-10-CM | POA: Diagnosis not present

## 2023-04-14 DIAGNOSIS — J301 Allergic rhinitis due to pollen: Secondary | ICD-10-CM | POA: Diagnosis not present

## 2023-04-14 DIAGNOSIS — J3081 Allergic rhinitis due to animal (cat) (dog) hair and dander: Secondary | ICD-10-CM | POA: Diagnosis not present

## 2023-04-14 DIAGNOSIS — H1045 Other chronic allergic conjunctivitis: Secondary | ICD-10-CM | POA: Diagnosis not present

## 2023-04-14 DIAGNOSIS — J3089 Other allergic rhinitis: Secondary | ICD-10-CM | POA: Diagnosis not present

## 2023-04-23 DIAGNOSIS — J301 Allergic rhinitis due to pollen: Secondary | ICD-10-CM | POA: Diagnosis not present

## 2023-04-23 DIAGNOSIS — J3081 Allergic rhinitis due to animal (cat) (dog) hair and dander: Secondary | ICD-10-CM | POA: Diagnosis not present

## 2023-04-23 DIAGNOSIS — J3089 Other allergic rhinitis: Secondary | ICD-10-CM | POA: Diagnosis not present

## 2023-05-01 DIAGNOSIS — J301 Allergic rhinitis due to pollen: Secondary | ICD-10-CM | POA: Diagnosis not present

## 2023-05-01 DIAGNOSIS — J3089 Other allergic rhinitis: Secondary | ICD-10-CM | POA: Diagnosis not present

## 2023-05-01 DIAGNOSIS — J3081 Allergic rhinitis due to animal (cat) (dog) hair and dander: Secondary | ICD-10-CM | POA: Diagnosis not present

## 2023-05-06 DIAGNOSIS — J3081 Allergic rhinitis due to animal (cat) (dog) hair and dander: Secondary | ICD-10-CM | POA: Diagnosis not present

## 2023-05-06 DIAGNOSIS — J3089 Other allergic rhinitis: Secondary | ICD-10-CM | POA: Diagnosis not present

## 2023-05-06 DIAGNOSIS — J301 Allergic rhinitis due to pollen: Secondary | ICD-10-CM | POA: Diagnosis not present

## 2023-05-07 DIAGNOSIS — J3089 Other allergic rhinitis: Secondary | ICD-10-CM | POA: Diagnosis not present

## 2023-05-07 DIAGNOSIS — J3081 Allergic rhinitis due to animal (cat) (dog) hair and dander: Secondary | ICD-10-CM | POA: Diagnosis not present

## 2023-05-07 DIAGNOSIS — J301 Allergic rhinitis due to pollen: Secondary | ICD-10-CM | POA: Diagnosis not present

## 2023-05-12 DIAGNOSIS — J3081 Allergic rhinitis due to animal (cat) (dog) hair and dander: Secondary | ICD-10-CM | POA: Diagnosis not present

## 2023-05-12 DIAGNOSIS — J3089 Other allergic rhinitis: Secondary | ICD-10-CM | POA: Diagnosis not present

## 2023-05-12 DIAGNOSIS — Z96652 Presence of left artificial knee joint: Secondary | ICD-10-CM | POA: Diagnosis not present

## 2023-05-12 DIAGNOSIS — J301 Allergic rhinitis due to pollen: Secondary | ICD-10-CM | POA: Diagnosis not present

## 2023-05-13 ENCOUNTER — Ambulatory Visit (INDEPENDENT_AMBULATORY_CARE_PROVIDER_SITE_OTHER): Payer: 59

## 2023-05-13 DIAGNOSIS — G4733 Obstructive sleep apnea (adult) (pediatric): Secondary | ICD-10-CM | POA: Diagnosis not present

## 2023-05-13 NOTE — Progress Notes (Signed)
95 percentile pressure 9.9   95th percentile leak 0.5   apnea index 0.5 /hr  apnea-hypopnea index  0.7 /hr   total days used  >4 hr 90 days  total days used <4 hr 0 days  Total compliance 100 percent  She is doing great, no questions or problems at this time.  Pt was seen by Tresa Endo  RRT/RCP  from [facility]

## 2023-05-21 DIAGNOSIS — J3081 Allergic rhinitis due to animal (cat) (dog) hair and dander: Secondary | ICD-10-CM | POA: Diagnosis not present

## 2023-05-21 DIAGNOSIS — J3089 Other allergic rhinitis: Secondary | ICD-10-CM | POA: Diagnosis not present

## 2023-05-21 DIAGNOSIS — J301 Allergic rhinitis due to pollen: Secondary | ICD-10-CM | POA: Diagnosis not present

## 2023-05-22 DIAGNOSIS — G4733 Obstructive sleep apnea (adult) (pediatric): Secondary | ICD-10-CM | POA: Diagnosis not present

## 2023-05-24 ENCOUNTER — Other Ambulatory Visit: Payer: Self-pay | Admitting: Nurse Practitioner

## 2023-05-24 DIAGNOSIS — E782 Mixed hyperlipidemia: Secondary | ICD-10-CM

## 2023-05-27 ENCOUNTER — Ambulatory Visit: Payer: 59

## 2023-05-28 DIAGNOSIS — J301 Allergic rhinitis due to pollen: Secondary | ICD-10-CM | POA: Diagnosis not present

## 2023-05-28 DIAGNOSIS — J3089 Other allergic rhinitis: Secondary | ICD-10-CM | POA: Diagnosis not present

## 2023-05-28 DIAGNOSIS — J3081 Allergic rhinitis due to animal (cat) (dog) hair and dander: Secondary | ICD-10-CM | POA: Diagnosis not present

## 2023-06-05 DIAGNOSIS — J3081 Allergic rhinitis due to animal (cat) (dog) hair and dander: Secondary | ICD-10-CM | POA: Diagnosis not present

## 2023-06-05 DIAGNOSIS — J3089 Other allergic rhinitis: Secondary | ICD-10-CM | POA: Diagnosis not present

## 2023-06-05 DIAGNOSIS — J301 Allergic rhinitis due to pollen: Secondary | ICD-10-CM | POA: Diagnosis not present

## 2023-06-10 ENCOUNTER — Telehealth: Payer: Self-pay | Admitting: Nurse Practitioner

## 2023-06-10 NOTE — Telephone Encounter (Signed)
Called pt regarding schedule appt with kelly

## 2023-06-12 DIAGNOSIS — J3081 Allergic rhinitis due to animal (cat) (dog) hair and dander: Secondary | ICD-10-CM | POA: Diagnosis not present

## 2023-06-12 DIAGNOSIS — J301 Allergic rhinitis due to pollen: Secondary | ICD-10-CM | POA: Diagnosis not present

## 2023-06-12 DIAGNOSIS — J3089 Other allergic rhinitis: Secondary | ICD-10-CM | POA: Diagnosis not present

## 2023-06-15 ENCOUNTER — Ambulatory Visit (INDEPENDENT_AMBULATORY_CARE_PROVIDER_SITE_OTHER): Payer: 59 | Admitting: Physician Assistant

## 2023-06-15 ENCOUNTER — Encounter: Payer: Self-pay | Admitting: Physician Assistant

## 2023-06-15 VITALS — BP 140/70 | HR 70 | Temp 98.2°F | Resp 16 | Ht 59.0 in | Wt 170.2 lb

## 2023-06-15 DIAGNOSIS — E669 Obesity, unspecified: Secondary | ICD-10-CM

## 2023-06-15 DIAGNOSIS — I10 Essential (primary) hypertension: Secondary | ICD-10-CM | POA: Diagnosis not present

## 2023-06-15 DIAGNOSIS — Z7189 Other specified counseling: Secondary | ICD-10-CM | POA: Diagnosis not present

## 2023-06-15 DIAGNOSIS — G4733 Obstructive sleep apnea (adult) (pediatric): Secondary | ICD-10-CM | POA: Diagnosis not present

## 2023-06-15 NOTE — Progress Notes (Signed)
[facility] [address] [place], [place] [address]  Pulmonary Sleep Medicine   Office Visit Note  Patient Name: Claudia Martin DOB: 21-Sep-1946 MRN [id number]  Date of Service: 06/15/2023  Complaints/HPI: Pt is here for routine pulmonary follow up. Wearing CPAP nightly. Her compliance is excellent and AHI well controlled. She is benefiting from PAP use. Denies dryness, headaches, or SOB. Changing supplies regularly and keeping it clean.   Compliance Download 05/13/23: 95 percentile pressure 9.9  95th percentile leak 0.5  apnea index 0.5 /hr  apnea-hypopnea index  0.7 /hr  total days used  >4 hr 90 days  total days used <4 hr 0 days Total compliance 100 percent  ROS  General: (-) fever, (-) chills, (-) night sweats, (-) weakness Skin: (-) rashes, (-) itching,. Eyes: (-) visual changes, (-) redness, (-) itching. Nose and Sinuses: (-) nasal stuffiness or itchiness, (-) postnasal drip, (-) nosebleeds, (-) sinus trouble. Mouth and Throat: (-) sore throat, (-) hoarseness. Neck: (-) swollen glands, (-) enlarged thyroid, (-) neck pain. Respiratory: - cough, (-) bloody sputum, - shortness of breath, - wheezing. Cardiovascular: - ankle swelling, (-) chest pain. Lymphatic: (-) lymph node enlargement. Neurologic: (-) numbness, (-) tingling. Psychiatric: (-) anxiety, (-) depression   Current Medication: Outpatient Encounter Medications as of 06/15/2023  Medication Sig   acetaminophen (TYLENOL) 500 MG tablet Take 2 tablets (1,000 mg total) by mouth every 8 (eight) hours.   acyclovir ointment (ZOVIRAX) 5 % Apply topically.   aspirin 81 MG chewable tablet Chew 1 tablet (81 mg total) by mouth daily.   cetirizine (ZYRTEC) 10 MG tablet Take 10 mg by mouth daily.   chlorhexidine (PERIDEX) 0.12 % solution USE 5 ML AS DIRECTED IN THE MOUTH OR THROAT TWICE DAILY   EPINEPHrine 0.3 mg/0.3 mL IJ SOAJ injection as needed.   levothyroxine (SYNTHROID) 50 MCG tablet TAKE 1  TABLET(50 MCG) BY MOUTH DAILY BEFORE BREAKFAST   losartan (COZAAR) 25 MG tablet Take 1 tablet (25 mg total) by mouth 2 (two) times daily.   rosuvastatin (CRESTOR) 5 MG tablet TAKE 1 TABLET(5 MG) BY MOUTH DAILY   traMADol (ULTRAM) 50 MG tablet Take 1 tablet (50 mg total) by mouth every 6 (six) hours as needed for moderate pain.   valACYclovir (VALTREX) 1000 MG tablet TAKE 1 TABLET BY MOUTH DAILY. CAN TAKE 1 TABLET BY MOUTH TWICE DAILY IF FLARE UP FOR 7 DAYS   metoprolol tartrate (LOPRESSOR) 25 MG tablet Take 1 tablet (25 mg total) by mouth 2 (two) times daily.   nitroGLYCERIN (NITROSTAT) 0.4 MG SL tablet Place 1 tablet (0.4 mg total) under the tongue every 5 (five) minutes x 3 doses as needed for chest pain.   No facility-administered encounter medications on file as of 06/15/2023.    Surgical History: Past Surgical History:  Procedure Laterality Date   ABDOMINAL WALL DEFECT REPAIR N/A 05/27/2022   Procedure: REPAIR ABDOMINAL WALL, reconstruction;  Surgeon: Leafy Ro, MD;  Location: [facility] ORS;  Service: General;  Laterality: N/A;   BREAST BIOPSY Bilateral    benign   CATARACT EXTRACTION W/PHACO Right 06/05/2021   Procedure: CATARACT EXTRACTION PHACO AND INTRAOCULAR LENS PLACEMENT (IOC) RIGHT;  Surgeon: Lockie Mola, MD;  Location: [facility];  Service: Ophthalmology;  Laterality: Right;  3.02 00:39.5   CATARACT EXTRACTION W/PHACO Left 12/04/2021   Procedure: CATARACT EXTRACTION PHACO AND INTRAOCULAR LENS PLACEMENT (IOC) LEFT;  Surgeon: Lockie Mola, MD;  Location: [facility];  Service: Ophthalmology;  Laterality: Left;  6.23 00:48.0   COLON SURGERY     COLONOSCOPY WITH PROPOFOL N/A 03/15/2018   Procedure: COLONOSCOPY WITH PROPOFOL;  Surgeon: Toney Reil, MD;  Location: [facility];  Service: Gastroenterology;  Laterality: N/A;   COLONOSCOPY WITH PROPOFOL N/A 01/09/2021   Procedure: COLONOSCOPY WITH PROPOFOL;  Surgeon: Toney Reil, MD;   Location: [facility] ENDOSCOPY;  Service: Gastroenterology;  Laterality: N/A;   CORONARY/GRAFT ACUTE MI REVASCULARIZATION N/A 06/06/2021   Procedure: Coronary/Graft Acute MI Revascularization;  Surgeon: Alwyn Pea, MD;  Location: [facility] INVASIVE CV LAB;  Service: Cardiovascular;  Laterality: N/A;   ECTOPIC PREGNANCY SURGERY     fx thumb     HERNIA REPAIR     INSERTION OF MESH N/A 05/27/2022   Procedure: INSERTION OF MESH;  Surgeon: Leafy Ro, MD;  Location: [facility] OR[facility];  Service: General;  Laterality: N/A;   LEFT HEART CATH AND CORONARY ANGIOGRAPHY N/A 06/06/2021   Procedure: LEFT HEART CATH AND CORONARY ANGIOGRAPHY;  Surgeon: Alwyn Pea, MD;  Location: [facility] INV[facility] CV [facility];  Service: Cardiovascular;  Laterality: N/A;   TOTAL KNEE ARTHROPLASTY Left 01/26/2023   Procedure: TOTAL KNEE ARTHROPLASTY;  Surgeon: Reinaldo Berber, MD;  Location: [facility];  Service: Orthopedics;  Laterality: Left;   VENTRAL HERNIA REPAIR N/A 05/27/2022   Procedure: HERNIA REPAIR VENTRAL ADULT, recurrent;  Surgeon: Leafy Ro, MD;  Location: [facility] OR[facility];  Service: General;  Laterality: N/A;    Medical History: Past Medical History:  Diagnosis Date   Allergic rhinitis    Aortic atherosclerosis (HCC)    Arthritis    CAD (coronary artery disease) 06/06/2021   a.) STEMI --> LHC: EF 55-65%, 100% RI --> PCI performed placing a 2.5 x 18 mm Onyx Frontier DES   Cellulitis and abscess    Diastolic dysfunction 06/07/2021   a.) TTE 06/07/2021: EF 55-60%, no RWMAs, triv MR, mild AoV sclerosis with no stenosis, G1DD; b.) TTE 07/17/2021: EF >55%, mild LVH, no RWMAs; triv TR, mild MR, G1DD   Diverticulosis    Enlarged heart    Eyelid inflammation    GERD (gastroesophageal reflux disease)    Hematuria syndrome    Hemorrhoids    Herpes genitalis    Herpes simplex    HTN (hypertension)    Hydronephrosis    Hypothyroidism    Incomplete bladder emptying    Internal hordeolum    Long term current use of  antithrombotics/antiplatelets    a.) on daily DAPT therapy (ASA + ticagrelor) x 1 year s/p STEMI; ticagrelor has been discontinued   OSA on CPAP    Primary ovarian failure    Sciatica    Shortness of breath    Sinus bradycardia    STEMI (ST elevation myocardial infarction) (HCC) 06/06/2021   a.) LHC/PCI 06/06/2021 --> 100% RI (2.5 x 18 mm Onyx Frontier DES)   Urethral diverticulum    Vaginal atrophy     Family History: Family History  Problem Relation Age of Onset   Tuberculosis Mother    Nephrolithiasis Brother    Vision loss Daughter    Hypertension Daughter     Social History: Social History   Socioeconomic History   Marital status: Divorced    Spouse name: Not on file   Number of children: Not on file   Years of education: Not on file   Highest education level: Not on file  Occupational History   Not on file  Tobacco Use   Smoking status: Never    Passive exposure: Never  Smokeless tobacco: Never  Vaping Use   Vaping status: Never Used  Substance and Sexual Activity   Alcohol use: No   Drug use: Never   Sexual activity: Not on file  Other Topics Concern   Not on file  Social History Narrative   Not on file   Social Determinants of Health   Financial Resource Strain: Not on file  Food Insecurity: No Food Insecurity (01/26/2023)   Hunger Vital Sign    Worried About Running Out of Food in the Last Year: Never true    Ran Out of Food in the Last Year: Never true  Transportation Needs: No Transportation Needs (01/26/2023)   PRAPARE - Administrator, Civil Service (Medical): No    Lack of Transportation (Non-Medical): No  Physical Activity: Not on file  Stress: Not on file  Social Connections: Not on file  Intimate Partner Violence: Not At Risk (01/26/2023)   Humiliation, Afraid, Rape, and Kick questionnaire    Fear of Current or Ex-Partner: No    Emotionally Abused: No    Physically Abused: No    Sexually Abused: No    Vital Signs: Blood  pressure (!) 140/70, pulse 70, temperature 98.2 F (36.8 C), resp. rate 16, height 4\' 11"  (1.499 m), weight 170 lb 3.2 oz (77.2 kg), SpO2 98%.  Examination: General Appearance: The patient is well-developed, well-nourished, and in no distress. Skin: Gross inspection of skin unremarkable. Head: normocephalic, no gross deformities. Eyes: no gross deformities noted. ENT: ears appear grossly normal no exudates. Neck: Supple. No thyromegaly. No LAD. Respiratory: Lungs clear to auscultation. Cardiovascular: Normal S1 and S2 without murmur or rub. Extremities: No cyanosis. pulses are equal. Neurologic: Alert and oriented. No involuntary movements.  LABS: No results found for this or any previous visit (from the past 2160 hour(s)).  Radiology: DG Knee Left Port  Result Date: 01/26/2023 CLINICAL DATA:  Status post total left knee arthroplasty. EXAM: PORTABLE LEFT KNEE - 1-2 VIEW COMPARISON:  Left knee radiograph 03/28/2010 FINDINGS: Left total knee prosthesis with cemented femoral and tibial components is in appropriate position without evidence of complication. Small suprapatellar joint effusion. A small amount air is seen within the knee joint, consistent with recent postoperative status. IMPRESSION: Expected postoperative changes of left total knee arthroplasty. Electronically Signed   By: Sherron Ales M.D.   On: 01/26/2023 10:25    No results found.  No results found.    Assessment and Plan: Patient Active Problem List   Diagnosis Date Noted   S/P TKR (total knee replacement) using cement, left 01/26/2023   Allergic rhinitis 07/02/2022   Wound dehiscence    S/P hernia repair 05/27/2022   Chronic allergic conjunctivitis 12/02/2021   Allergic rhinitis due to animal (cat) (dog) hair and dander 12/02/2021   Allergic rhinitis due to pollen 12/02/2021   STEMI (ST elevation myocardial infarction) (HCC) 06/06/2021   History of colonic polyps    Candidiasis of skin 01/04/2020   Ventral  hernia without obstruction or gangrene 11/28/2019   Herpes simplex infection 10/27/2019   Stomatitis and mucositis 10/27/2019   Vitamin B12 deficiency 06/30/2018   Unspecified menopausal and perimenopausal disorder 06/30/2018   Vitamin D deficiency 06/30/2018   Acquired hypothyroidism 02/05/2018   Essential hypertension 02/05/2018   Obstructive sleep apnea (adult) (pediatric) 01/06/2018    1. Obstructive sleep apnea [G47.33] Continue excellent compliance  2. CPAP use counseling CPAP couseling-Discussed importance of adequate CPAP use as well as proper care and cleaning techniques of machine  and all supplies.  3. Essential hypertension Continue current medication and f/u with PCP.  4. Obesity (BMI 30-39.9) Obesity Counseling: Had a lengthy discussion regarding patients BMI and weight issues. Patient was instructed on portion control as well as increased activity. Also discussed caloric restrictions with trying to maintain intake less than 2000 Kcal. Discussions were made in accordance with the 5As of weight management. Simple actions such as not eating late and if able to, taking a walk is suggested.    General Counseling: I have discussed the findings of the evaluation and examination with Byrd Hesselbach.  I have also discussed any further diagnostic evaluation thatmay be needed or ordered today. Taylen verbalizes understanding of the findings of todays visit. We also reviewed her medications today and discussed drug interactions and side effects including but not limited excessive drowsiness and altered mental states. We also discussed that there is always a risk not just to her but also people around her. she has been encouraged to call the office with any questions or concerns that should arise related to todays visit.  No orders of the defined types were placed in this encounter.    Time spent: 30  I have personally obtained a history, examined the patient, evaluated laboratory and imaging  results, formulated the assessment and plan and placed orders. This patient was seen by Lynn Ito, PA-C in collaboration with Dr. Freda Munro as a part of collaborative care agreement.     Yevonne Pax, MD [facility] Pulmonary and Critical Care Sleep medicine

## 2023-06-19 DIAGNOSIS — J301 Allergic rhinitis due to pollen: Secondary | ICD-10-CM | POA: Diagnosis not present

## 2023-06-19 DIAGNOSIS — J3081 Allergic rhinitis due to animal (cat) (dog) hair and dander: Secondary | ICD-10-CM | POA: Diagnosis not present

## 2023-06-26 DIAGNOSIS — J3089 Other allergic rhinitis: Secondary | ICD-10-CM | POA: Diagnosis not present

## 2023-06-26 DIAGNOSIS — J3081 Allergic rhinitis due to animal (cat) (dog) hair and dander: Secondary | ICD-10-CM | POA: Diagnosis not present

## 2023-06-26 DIAGNOSIS — J301 Allergic rhinitis due to pollen: Secondary | ICD-10-CM | POA: Diagnosis not present

## 2023-06-29 DIAGNOSIS — G4733 Obstructive sleep apnea (adult) (pediatric): Secondary | ICD-10-CM | POA: Diagnosis not present

## 2023-07-01 DIAGNOSIS — E782 Mixed hyperlipidemia: Secondary | ICD-10-CM | POA: Diagnosis not present

## 2023-07-02 ENCOUNTER — Telehealth: Payer: Self-pay

## 2023-07-02 LAB — LIPID PANEL
Chol/HDL Ratio: 3 ratio (ref 0.0–4.4)
Cholesterol, Total: 119 mg/dL (ref 100–199)
HDL: 40 mg/dL (ref >39)
LDL Chol Calc (NIH): 57 mg/dL (ref 0–99)
Triglycerides: 122 mg/dL (ref 0–149)
VLDL Cholesterol Cal: 22 mg/dL (ref 5–40)

## 2023-07-02 NOTE — Progress Notes (Signed)
Cholesterol has improved, all in normal range now.

## 2023-07-02 NOTE — Telephone Encounter (Signed)
Patient notified

## 2023-07-02 NOTE — Telephone Encounter (Signed)
-----   Message from [facility] sent at 07/02/2023  9:42 AM EDT ----- Cholesterol has improved, all in normal range now.

## 2023-07-03 DIAGNOSIS — J301 Allergic rhinitis due to pollen: Secondary | ICD-10-CM | POA: Diagnosis not present

## 2023-07-03 DIAGNOSIS — J3089 Other allergic rhinitis: Secondary | ICD-10-CM | POA: Diagnosis not present

## 2023-07-03 DIAGNOSIS — J3081 Allergic rhinitis due to animal (cat) (dog) hair and dander: Secondary | ICD-10-CM | POA: Diagnosis not present

## 2023-07-10 DIAGNOSIS — J301 Allergic rhinitis due to pollen: Secondary | ICD-10-CM | POA: Diagnosis not present

## 2023-07-10 DIAGNOSIS — J3081 Allergic rhinitis due to animal (cat) (dog) hair and dander: Secondary | ICD-10-CM | POA: Diagnosis not present

## 2023-07-10 DIAGNOSIS — J3089 Other allergic rhinitis: Secondary | ICD-10-CM | POA: Diagnosis not present

## 2023-07-17 DIAGNOSIS — J301 Allergic rhinitis due to pollen: Secondary | ICD-10-CM | POA: Diagnosis not present

## 2023-07-17 DIAGNOSIS — J3089 Other allergic rhinitis: Secondary | ICD-10-CM | POA: Diagnosis not present

## 2023-07-17 DIAGNOSIS — J3081 Allergic rhinitis due to animal (cat) (dog) hair and dander: Secondary | ICD-10-CM | POA: Diagnosis not present

## 2023-07-20 DIAGNOSIS — I2510 Atherosclerotic heart disease of native coronary artery without angina pectoris: Secondary | ICD-10-CM | POA: Diagnosis not present

## 2023-07-20 DIAGNOSIS — G4733 Obstructive sleep apnea (adult) (pediatric): Secondary | ICD-10-CM | POA: Diagnosis not present

## 2023-07-20 DIAGNOSIS — I10 Essential (primary) hypertension: Secondary | ICD-10-CM | POA: Diagnosis not present

## 2023-07-20 DIAGNOSIS — Z955 Presence of coronary angioplasty implant and graft: Secondary | ICD-10-CM | POA: Diagnosis not present

## 2023-07-20 DIAGNOSIS — R040 Epistaxis: Secondary | ICD-10-CM | POA: Diagnosis not present

## 2023-07-20 DIAGNOSIS — E782 Mixed hyperlipidemia: Secondary | ICD-10-CM | POA: Diagnosis not present

## 2023-07-20 DIAGNOSIS — I2121 ST elevation (STEMI) myocardial infarction involving left circumflex coronary artery: Secondary | ICD-10-CM | POA: Diagnosis not present

## 2023-07-24 DIAGNOSIS — J3089 Other allergic rhinitis: Secondary | ICD-10-CM | POA: Diagnosis not present

## 2023-07-24 DIAGNOSIS — J3081 Allergic rhinitis due to animal (cat) (dog) hair and dander: Secondary | ICD-10-CM | POA: Diagnosis not present

## 2023-07-24 DIAGNOSIS — J301 Allergic rhinitis due to pollen: Secondary | ICD-10-CM | POA: Diagnosis not present

## 2023-08-05 ENCOUNTER — Ambulatory Visit (INDEPENDENT_AMBULATORY_CARE_PROVIDER_SITE_OTHER): Payer: 59 | Admitting: Nurse Practitioner

## 2023-08-05 ENCOUNTER — Encounter: Payer: Self-pay | Admitting: Nurse Practitioner

## 2023-08-05 VITALS — BP 132/84 | HR 80 | Temp 97.8°F | Resp 16 | Ht 59.0 in | Wt 170.8 lb

## 2023-08-05 DIAGNOSIS — K047 Periapical abscess without sinus: Secondary | ICD-10-CM | POA: Diagnosis not present

## 2023-08-05 DIAGNOSIS — K121 Other forms of stomatitis: Secondary | ICD-10-CM | POA: Diagnosis not present

## 2023-08-05 DIAGNOSIS — E039 Hypothyroidism, unspecified: Secondary | ICD-10-CM

## 2023-08-05 DIAGNOSIS — K1230 Oral mucositis (ulcerative), unspecified: Secondary | ICD-10-CM | POA: Diagnosis not present

## 2023-08-05 DIAGNOSIS — I10 Essential (primary) hypertension: Secondary | ICD-10-CM | POA: Diagnosis not present

## 2023-08-05 DIAGNOSIS — Z1231 Encounter for screening mammogram for malignant neoplasm of breast: Secondary | ICD-10-CM

## 2023-08-05 DIAGNOSIS — E782 Mixed hyperlipidemia: Secondary | ICD-10-CM

## 2023-08-05 DIAGNOSIS — Z23 Encounter for immunization: Secondary | ICD-10-CM | POA: Diagnosis not present

## 2023-08-05 MED ORDER — CHLORHEXIDINE GLUCONATE 0.12 % MT SOLN: OROMUCOSAL | 1 refills | Status: DC

## 2023-08-05 MED ORDER — LEVOTHYROXINE SODIUM 50 MCG PO TABS
ORAL_TABLET | ORAL | 1 refills | Status: DC
Start: 2023-08-05 — End: 2023-12-08

## 2023-08-05 MED ORDER — PENICILLIN V POTASSIUM 500 MG PO TABS
500.0000 mg | ORAL_TABLET | Freq: Two times a day (BID) | ORAL | 0 refills | Status: AC
Start: 2023-08-05 — End: 2023-08-12

## 2023-08-05 MED ORDER — ROSUVASTATIN CALCIUM 5 MG PO TABS: 5.0000 mg | ORAL_TABLET | Freq: Every day | ORAL | 1 refills | Status: DC

## 2023-08-05 MED ORDER — LOSARTAN POTASSIUM 25 MG PO TABS
25.0000 mg | ORAL_TABLET | Freq: Two times a day (BID) | ORAL | 1 refills | Status: DC
Start: 2023-08-05 — End: 2023-12-08

## 2023-08-05 MED ORDER — TETANUS-DIPHTH-ACELL PERTUSSIS 5-2.5-18.5 LF-MCG/0.5 IM SUSP
0.5000 mL | Freq: Once | INTRAMUSCULAR | 0 refills | Status: AC
Start: 2023-08-05 — End: 2023-08-05

## 2023-08-05 MED ORDER — METOPROLOL TARTRATE 25 MG PO TABS
25.0000 mg | ORAL_TABLET | Freq: Two times a day (BID) | ORAL | 1 refills | Status: DC
Start: 2023-08-05 — End: 2023-12-08

## 2023-08-05 MED ORDER — PNEUMOCOCCAL 20-VAL CONJ VACC 0.5 ML IM SUSY
0.5000 mL | PREFILLED_SYRINGE | INTRAMUSCULAR | 0 refills | Status: AC
Start: 2023-08-05 — End: 2023-08-05

## 2023-08-05 NOTE — Progress Notes (Cosign Needed)
[facility] [address] [place], [place] [address]  Internal MEDICINE  Office Visit Note  Patient Name: Claudia Martin  [id number]  [id number]  Date of Service: 08/05/2023  Chief Complaint  Patient presents with   Gastroesophageal Reflux   Hypertension   Follow-up    HPI Claudia Martin presents for a follow-up visit for hypertension, hyperlipidemia, screenings, vaccinations and refills. Due for routine mammogram Dental infection -- needs an antibiotics Due for tdap and pneumonia vaccines  Hypothyroidism -- takes levothyroxine Hypertension -- controlled with current medications  Hyperlipidemia -- on statin therapy     Current Medication: Outpatient Encounter Medications as of 08/05/2023  Medication Sig   acetaminophen (TYLENOL) 500 MG tablet Take 2 tablets (1,000 mg total) by mouth every 8 (eight) hours.   acyclovir ointment (ZOVIRAX) 5 % Apply topically.   aspirin 81 MG chewable tablet Chew 1 tablet (81 mg total) by mouth daily.   cetirizine (ZYRTEC) 10 MG tablet Take 10 mg by mouth daily.   EPINEPHrine 0.3 mg/0.3 mL IJ SOAJ injection as needed.   [EXPIRED] penicillin v potassium (VEETID) 500 MG tablet Take 1 tablet (500 mg total) by mouth 2 (two) times daily for 7 days. Take with food   traMADol (ULTRAM) 50 MG tablet Take 1 tablet (50 mg total) by mouth every 6 (six) hours as needed for moderate pain.   valACYclovir (VALTREX) 1000 MG tablet TAKE 1 TABLET BY MOUTH DAILY. CAN TAKE 1 TABLET BY MOUTH TWICE DAILY IF FLARE UP FOR 7 DAYS   [DISCONTINUED] chlorhexidine (PERIDEX) 0.12 % solution USE 5 ML AS DIRECTED IN THE MOUTH OR THROAT TWICE DAILY   [DISCONTINUED] levothyroxine (SYNTHROID) 50 MCG tablet TAKE 1 TABLET(50 MCG) BY MOUTH DAILY BEFORE BREAKFAST   [DISCONTINUED] losartan (COZAAR) 25 MG tablet Take 1 tablet (25 mg total) by mouth 2 (two) times daily.   [DISCONTINUED] pneumococcal 20-valent conjugate vaccine (PREVNAR 20) 0.5 ML injection Inject 0.5 mLs into  the muscle tomorrow at 10 am.   [DISCONTINUED] rosuvastatin (CRESTOR) 5 MG tablet TAKE 1 TABLET(5 MG) BY MOUTH DAILY   [DISCONTINUED] Tdap (BOOSTRIX) 5-2.5-18.5 LF-MCG/0.5 injection Inject 0.5 mLs into the muscle once.   chlorhexidine (PERIDEX) 0.12 % solution USE 5 ML AS DIRECTED IN THE MOUTH OR THROAT TWICE DAILY   levothyroxine (SYNTHROID) 50 MCG tablet TAKE 1 TABLET(50 MCG) BY MOUTH DAILY BEFORE BREAKFAST   losartan (COZAAR) 25 MG tablet Take 1 tablet (25 mg total) by mouth 2 (two) times daily.   metoprolol tartrate (LOPRESSOR) 25 MG tablet Take 1 tablet (25 mg total) by mouth 2 (two) times daily.   nitroGLYCERIN (NITROSTAT) 0.4 MG SL tablet Place 1 tablet (0.4 mg total) under the tongue every 5 (five) minutes x 3 doses as needed for chest pain.   [EXPIRED] pneumococcal 20-valent conjugate vaccine (PREVNAR 20) 0.5 ML injection Inject 0.5 mLs into the muscle tomorrow at 10 am for 1 dose.   rosuvastatin (CRESTOR) 5 MG tablet Take 1 tablet (5 mg total) by mouth daily.   [EXPIRED] Tdap (BOOSTRIX) 5-2.5-18.5 LF-MCG/0.5 injection Inject 0.5 mLs into the muscle once for 1 dose.   [DISCONTINUED] metoprolol tartrate (LOPRESSOR) 25 MG tablet Take 1 tablet (25 mg total) by mouth 2 (two) times daily.   No facility-administered encounter medications on file as of 08/05/2023.    Surgical History: Past Surgical History:  Procedure Laterality Date   ABDOMINAL WALL DEFECT REPAIR N/A 05/27/2022   Procedure: REPAIR ABDOMINAL WALL, reconstruction;  Surgeon: Leafy Ro, MD;  Location:  [facility];  Service: General;  Laterality: N/A;   BREAST BIOPSY Bilateral    benign   CATARACT EXTRACTION W/PHACO Right 06/05/2021   Procedure: CATARACT EXTRACTION PHACO AND INTRAOCULAR LENS PLACEMENT (IOC) RIGHT;  Surgeon: Lockie Mola, MD;  Location: [facility];  Service: Ophthalmology;  Laterality: Right;  3.02 00:39.5   CATARACT EXTRACTION W/PHACO Left 12/04/2021   Procedure: CATARACT EXTRACTION PHACO  AND INTRAOCULAR LENS PLACEMENT (IOC) LEFT;  Surgeon: Lockie Mola, MD;  Location: [facility];  Service: Ophthalmology;  Laterality: Left;  6.23 00:48.0   COLON SURGERY     COLONOSCOPY WITH PROPOFOL N/A 03/15/2018   Procedure: COLONOSCOPY WITH PROPOFOL;  Surgeon: Toney Reil, MD;  Location: [facility];  Service: Gastroenterology;  Laterality: N/A;   COLONOSCOPY WITH PROPOFOL N/A 01/09/2021   Procedure: COLONOSCOPY WITH PROPOFOL;  Surgeon: Toney Reil, MD;  Location: [facility];  Service: Gastroenterology;  Laterality: N/A;   CORONARY/GRAFT ACUTE MI REVASCULARIZATION N/A 06/06/2021   Procedure: Coronary/Graft Acute MI Revascularization;  Surgeon: Alwyn Pea, MD;  Location: [facility];  Service: Cardiovascular;  Laterality: N/A;   ECTOPIC PREGNANCY SURGERY     fx thumb     HERNIA REPAIR     INSERTION OF MESH N/A 05/27/2022   Procedure: INSERTION OF MESH;  Surgeon: Leafy Ro, MD;  Location: [facility];  Service: General;  Laterality: N/A;   LEFT HEART CATH AND CORONARY ANGIOGRAPHY N/A 06/06/2021   Procedure: LEFT HEART CATH AND CORONARY ANGIOGRAPHY;  Surgeon: Alwyn Pea, MD;  Location: [facility];  Service: Cardiovascular;  Laterality: N/A;   TOTAL KNEE ARTHROPLASTY Left 01/26/2023   Procedure: TOTAL KNEE ARTHROPLASTY;  Surgeon: Reinaldo Berber, MD;  Location: [facility];  Service: Orthopedics;  Laterality: Left;   VENTRAL HERNIA REPAIR N/A 05/27/2022   Procedure: HERNIA REPAIR VENTRAL ADULT, recurrent;  Surgeon: Leafy Ro, MD;  Location: [facility];  Service: General;  Laterality: N/A;    Medical History: Past Medical History:  Diagnosis Date   Allergic rhinitis    Aortic atherosclerosis (HCC)    Arthritis    CAD (coronary artery disease) 06/06/2021   a.) STEMI --> LHC: EF 55-65%, 100% RI --> PCI performed placing a 2.5 x 18 mm Onyx Frontier DES   Cellulitis and abscess    Diastolic dysfunction 06/07/2021    a.) TTE 06/07/2021: EF 55-60%, no RWMAs, triv MR, mild AoV sclerosis with no stenosis, G1DD; b.) TTE 07/17/2021: EF >55%, mild LVH, no RWMAs; triv TR, mild MR, G1DD   Diverticulosis    Enlarged heart    Eyelid inflammation    GERD (gastroesophageal reflux disease)    Hematuria syndrome    Hemorrhoids    Herpes genitalis    Herpes simplex    HTN (hypertension)    Hydronephrosis    Hypothyroidism    Incomplete bladder emptying    Internal hordeolum    Long term current use of antithrombotics/antiplatelets    a.) on daily DAPT therapy (ASA + ticagrelor) x 1 year s/p STEMI; ticagrelor has been discontinued   OSA on CPAP    Primary ovarian failure    Sciatica    Shortness of breath    Sinus bradycardia    STEMI (ST elevation myocardial infarction) (HCC) 06/06/2021   a.) LHC/PCI 06/06/2021 --> 100% RI (2.5 x 18 mm Onyx Frontier DES)   Urethral diverticulum    Vaginal atrophy     Family History: Family History  Problem Relation Age of Onset  Tuberculosis Mother    Nephrolithiasis Brother    Vision loss Daughter    Hypertension Daughter     Social History   Socioeconomic History   Marital status: Divorced    Spouse name: Not on file   Number of children: Not on file   Years of education: Not on file   Highest education level: Not on file  Occupational History   Not on file  Tobacco Use   Smoking status: Never    Passive exposure: Never   Smokeless tobacco: Never  Vaping Use   Vaping status: Never Used  Substance and Sexual Activity   Alcohol use: No   Drug use: Never   Sexual activity: Not on file  Other Topics Concern   Not on file  Social History Narrative   Not on file   Social Drivers of Health   Financial Resource Strain: Not on file  Food Insecurity: No Food Insecurity (01/26/2023)   Hunger Vital Sign    Worried About Running Out of Food in the Last Year: Never true    Ran Out of Food in the Last Year: Never true  Transportation Needs: No  Transportation Needs (01/26/2023)   PRAPARE - Transportation    Lack of Transportation (Medical): No    Lack of Transportation (Non-Medical): No  Physical Activity: Not on file  Stress: Not on file  Social Connections: Not on file  Intimate Partner Violence: Not At Risk (01/26/2023)   Humiliation, Afraid, Rape, and Kick questionnaire    Fear of Current or Ex-Partner: No    Emotionally Abused: No    Physically Abused: No    Sexually Abused: No      Review of Systems  Constitutional:  Negative for chills, fatigue and unexpected weight change.  HENT:  Negative for congestion, rhinorrhea, sneezing and sore throat.   Eyes:  Negative for redness.  Respiratory: Negative.  Negative for cough, chest tightness, shortness of breath and wheezing.   Cardiovascular: Negative.  Negative for chest pain and palpitations.  Gastrointestinal:  Negative for abdominal pain, constipation, diarrhea, nausea and vomiting.  Genitourinary:  Negative for dysuria and frequency.  Musculoskeletal:  Negative for arthralgias, back pain, joint swelling and neck pain.  Skin:  Negative for rash.  Neurological: Negative.  Negative for tremors and numbness.  Hematological:  Negative for adenopathy. Does not bruise/bleed easily.  Psychiatric/Behavioral:  Negative for behavioral problems (Depression), sleep disturbance and suicidal ideas. The patient is not nervous/anxious.     Vital Signs: BP 132/84   Pulse 80   Temp 97.8 F (36.6 C)   Resp 16   Ht 4\' 11"  (1.499 m)   Wt 170 lb 12.8 oz (77.5 kg)   SpO2 99%   BMI 34.50 kg/m    Physical Exam Vitals reviewed.  Constitutional:      General: She is not in acute distress.    Appearance: Normal appearance. She is obese. She is not ill-appearing.  HENT:     Head: Normocephalic and atraumatic.  Eyes:     Pupils: Pupils are equal, round, and reactive to light.  Cardiovascular:     Rate and Rhythm: Normal rate and regular rhythm.  Pulmonary:     Effort: Pulmonary  effort is normal. No respiratory distress.  Neurological:     Mental Status: She is alert and oriented to person, place, and time.  Psychiatric:        Mood and Affect: Mood normal.        Behavior: Behavior normal.  Assessment/Plan: 1. Dental infection (Primary) 7 day course of penicillin prescribed, take until gone. - penicillin v potassium (VEETID) 500 MG tablet; Take 1 tablet (500 mg total) by mouth 2 (two) times daily for 7 days. Take with food  Dispense: 14 tablet; Refill: 0  2. Essential hypertension Stable, continue losartan and metoprolol as prescribed - losartan (COZAAR) 25 MG tablet; Take 1 tablet (25 mg total) by mouth 2 (two) times daily.  Dispense: 180 tablet; Refill: 1 - metoprolol tartrate (LOPRESSOR) 25 MG tablet; Take 1 tablet (25 mg total) by mouth 2 (two) times daily.  Dispense: 180 tablet; Refill: 1  3. Mixed hyperlipidemia Continue rosuvastatin as prescribed  - rosuvastatin (CRESTOR) 5 MG tablet; Take 1 tablet (5 mg total) by mouth daily.  Dispense: 90 tablet; Refill: 1  4. Acquired hypothyroidism Continue levothyroxine as prescribed  - levothyroxine (SYNTHROID) 50 MCG tablet; TAKE 1 TABLET(50 MCG) BY MOUTH DAILY BEFORE BREAKFAST  Dispense: 90 tablet; Refill: 1  5. Stomatitis and mucositis Continue chlorhexidine mouthwash as prescribed.  - chlorhexidine (PERIDEX) 0.12 % solution; USE 5 ML AS DIRECTED IN THE MOUTH OR THROAT TWICE DAILY  Dispense: 473 mL; Refill: 1  6. Need for vaccination - Tdap (BOOSTRIX) 5-2.5-18.5 LF-MCG/0.5 injection; Inject 0.5 mLs into the muscle once for 1 dose.  Dispense: 0.5 mL; Refill: 0 - pneumococcal 20-valent conjugate vaccine (PREVNAR 20) 0.5 ML injection; Inject 0.5 mLs into the muscle tomorrow at 10 am for 1 dose.  Dispense: 0.5 mL; Refill: 0  7. Encounter for screening mammogram for malignant neoplasm of breast Routine mammogram ordered  - MM 3D SCREENING MAMMOGRAM BILATERAL BREAST; Future   General Counseling:  Lynnell Grain understanding of the findings of todays visit and agrees with plan of treatment. I have discussed any further diagnostic evaluation that may be needed or ordered today. We also reviewed her medications today. she has been encouraged to call the office with any questions or concerns that should arise related to todays visit.    Orders Placed This Encounter  Procedures   MM 3D SCREENING MAMMOGRAM BILATERAL BREAST    Meds ordered this encounter  Medications   Tdap (BOOSTRIX) 5-2.5-18.5 LF-MCG/0.5 injection    Sig: Inject 0.5 mLs into the muscle once for 1 dose.    Dispense:  0.5 mL    Refill:  0   pneumococcal 20-valent conjugate vaccine (PREVNAR 20) 0.5 ML injection    Sig: Inject 0.5 mLs into the muscle tomorrow at 10 am for 1 dose.    Dispense:  0.5 mL    Refill:  0   rosuvastatin (CRESTOR) 5 MG tablet    Sig: Take 1 tablet (5 mg total) by mouth daily.    Dispense:  90 tablet    Refill:  1   penicillin v potassium (VEETID) 500 MG tablet    Sig: Take 1 tablet (500 mg total) by mouth 2 (two) times daily for 7 days. Take with food    Dispense:  14 tablet    Refill:  0   losartan (COZAAR) 25 MG tablet    Sig: Take 1 tablet (25 mg total) by mouth 2 (two) times daily.    Dispense:  180 tablet    Refill:  1   levothyroxine (SYNTHROID) 50 MCG tablet    Sig: TAKE 1 TABLET(50 MCG) BY MOUTH DAILY BEFORE BREAKFAST    Dispense:  90 tablet    Refill:  1   chlorhexidine (PERIDEX) 0.12 % solution    Sig: USE 5  ML AS DIRECTED IN THE MOUTH OR THROAT TWICE DAILY    Dispense:  473 mL    Refill:  1    For future refills   metoprolol tartrate (LOPRESSOR) 25 MG tablet    Sig: Take 1 tablet (25 mg total) by mouth 2 (two) times daily.    Dispense:  180 tablet    Refill:  1    Return in about 4 months (around 12/04/2023) for F/U, Kaz Auld PCP.   Total time spent:30 Minutes Time spent includes review of chart, medications, test results, and follow up plan with the patient.   [place]  Controlled Substance Database was reviewed by me.  This patient was seen by Sallyanne Kuster, FNP-C in collaboration with Dr. Beverely Risen as a part of collaborative care agreement.   Sulma Ruffino R. Tedd Sias, MSN, FNP-C Internal medicine

## 2023-08-07 DIAGNOSIS — J301 Allergic rhinitis due to pollen: Secondary | ICD-10-CM | POA: Diagnosis not present

## 2023-08-07 DIAGNOSIS — J3089 Other allergic rhinitis: Secondary | ICD-10-CM | POA: Diagnosis not present

## 2023-08-07 DIAGNOSIS — J3081 Allergic rhinitis due to animal (cat) (dog) hair and dander: Secondary | ICD-10-CM | POA: Diagnosis not present

## 2023-08-12 DIAGNOSIS — Z96652 Presence of left artificial knee joint: Secondary | ICD-10-CM | POA: Diagnosis not present

## 2023-08-14 DIAGNOSIS — J3089 Other allergic rhinitis: Secondary | ICD-10-CM | POA: Diagnosis not present

## 2023-08-14 DIAGNOSIS — J301 Allergic rhinitis due to pollen: Secondary | ICD-10-CM | POA: Diagnosis not present

## 2023-08-14 DIAGNOSIS — J3081 Allergic rhinitis due to animal (cat) (dog) hair and dander: Secondary | ICD-10-CM | POA: Diagnosis not present

## 2023-08-21 DIAGNOSIS — J301 Allergic rhinitis due to pollen: Secondary | ICD-10-CM | POA: Diagnosis not present

## 2023-08-21 DIAGNOSIS — J3081 Allergic rhinitis due to animal (cat) (dog) hair and dander: Secondary | ICD-10-CM | POA: Diagnosis not present

## 2023-08-21 DIAGNOSIS — J3089 Other allergic rhinitis: Secondary | ICD-10-CM | POA: Diagnosis not present

## 2023-08-25 ENCOUNTER — Encounter: Payer: Self-pay | Admitting: Nurse Practitioner

## 2023-08-25 ENCOUNTER — Ambulatory Visit (INDEPENDENT_AMBULATORY_CARE_PROVIDER_SITE_OTHER): Payer: 59 | Admitting: Nurse Practitioner

## 2023-08-25 VITALS — BP 144/74 | HR 74 | Temp 98.3°F | Resp 16 | Ht 59.0 in | Wt 171.0 lb

## 2023-08-25 DIAGNOSIS — N3001 Acute cystitis with hematuria: Secondary | ICD-10-CM

## 2023-08-25 DIAGNOSIS — N390 Urinary tract infection, site not specified: Secondary | ICD-10-CM | POA: Diagnosis not present

## 2023-08-25 MED ORDER — CIPROFLOXACIN HCL 500 MG PO TABS
500.0000 mg | ORAL_TABLET | Freq: Two times a day (BID) | ORAL | 0 refills | Status: AC
Start: 2023-08-25 — End: 2023-09-01

## 2023-08-25 NOTE — Progress Notes (Signed)
[facility] [address] [place], [place] [address]  Internal MEDICINE  Office Visit Note  Patient Name: Claudia Martin  [id number]  [id number]  Date of Service: 08/25/2023  Chief Complaint  Patient presents with   Acute Visit    uti     HPI Tahniya presents for an acute sick visit for symptoms of UTI --onset of symptoms was last week.  --reports urgency, frequency, burning with urination, urinary discomfort.  -- urine specimen sent for culture -- was on AZO.      Current Medication:  Outpatient Encounter Medications as of 08/25/2023  Medication Sig   acetaminophen (TYLENOL) 500 MG tablet Take 2 tablets (1,000 mg total) by mouth every 8 (eight) hours.   acyclovir ointment (ZOVIRAX) 5 % Apply topically.   aspirin 81 MG chewable tablet Chew 1 tablet (81 mg total) by mouth daily.   cetirizine (ZYRTEC) 10 MG tablet Take 10 mg by mouth daily.   chlorhexidine (PERIDEX) 0.12 % solution USE 5 ML AS DIRECTED IN THE MOUTH OR THROAT TWICE DAILY   ciprofloxacin (CIPRO) 500 MG tablet Take 1 tablet (500 mg total) by mouth 2 (two) times daily for 7 days. Take with food.   EPINEPHrine 0.3 mg/0.3 mL IJ SOAJ injection as needed.   levothyroxine (SYNTHROID) 50 MCG tablet TAKE 1 TABLET(50 MCG) BY MOUTH DAILY BEFORE BREAKFAST   losartan (COZAAR) 25 MG tablet Take 1 tablet (25 mg total) by mouth 2 (two) times daily.   metoprolol tartrate (LOPRESSOR) 25 MG tablet Take 1 tablet (25 mg total) by mouth 2 (two) times daily.   rosuvastatin (CRESTOR) 5 MG tablet Take 1 tablet (5 mg total) by mouth daily.   traMADol (ULTRAM) 50 MG tablet Take 1 tablet (50 mg total) by mouth every 6 (six) hours as needed for moderate pain.   valACYclovir (VALTREX) 1000 MG tablet TAKE 1 TABLET BY MOUTH DAILY. CAN TAKE 1 TABLET BY MOUTH TWICE DAILY IF FLARE UP FOR 7 DAYS   nitroGLYCERIN (NITROSTAT) 0.4 MG SL tablet Place 1 tablet (0.4 mg total) under the tongue every 5 (five) minutes x 3 doses as needed  for chest pain.   No facility-administered encounter medications on file as of 08/25/2023.      Medical History: Past Medical History:  Diagnosis Date   Allergic rhinitis    Aortic atherosclerosis (HCC)    Arthritis    CAD (coronary artery disease) 06/06/2021   a.) STEMI --> LHC: EF 55-65%, 100% RI --> PCI performed placing a 2.5 x 18 mm Onyx Frontier DES   Cellulitis and abscess    Diastolic dysfunction 06/07/2021   a.) TTE 06/07/2021: EF 55-60%, no RWMAs, triv MR, mild AoV sclerosis with no stenosis, G1DD; b.) TTE 07/17/2021: EF >55%, mild LVH, no RWMAs; triv TR, mild MR, G1DD   Diverticulosis    Enlarged heart    Eyelid inflammation    GERD (gastroesophageal reflux disease)    Hematuria syndrome    Hemorrhoids    Herpes genitalis    Herpes simplex    HTN (hypertension)    Hydronephrosis    Hypothyroidism    Incomplete bladder emptying    Internal hordeolum    Long term current use of antithrombotics/antiplatelets    a.) on daily DAPT therapy (ASA + ticagrelor) x 1 year s/p STEMI; ticagrelor has been discontinued   OSA on CPAP    Primary ovarian failure    Sciatica    Shortness of breath    Sinus bradycardia  STEMI (ST elevation myocardial infarction) (HCC) 06/06/2021   a.) LHC/PCI 06/06/2021 --> 100% RI (2.5 x 18 mm Onyx Frontier DES)   Urethral diverticulum    Vaginal atrophy      Vital Signs: BP (!) 144/74   Pulse 74   Temp 98.3 F (36.8 C)   Resp 16   Ht 4\' 11"  (1.499 m)   Wt 171 lb (77.6 kg)   SpO2 94%   BMI 34.54 kg/m    Review of Systems  Constitutional:  Positive for fatigue.  Respiratory: Negative.  Negative for cough, chest tightness, shortness of breath and wheezing.   Cardiovascular: Negative.  Negative for chest pain and palpitations.  Genitourinary:  Positive for dysuria, frequency, hematuria, pelvic pain and urgency.    Physical Exam Vitals reviewed.  Constitutional:      General: She is not in acute distress.    Appearance:  Normal appearance. She is obese. She is ill-appearing.  HENT:     Head: Normocephalic and atraumatic.  Eyes:     Pupils: Pupils are equal, round, and reactive to light.  Cardiovascular:     Rate and Rhythm: Normal rate and regular rhythm.  Abdominal:     Tenderness: There is abdominal tenderness in the suprapubic area.  Neurological:     Mental Status: She is alert and oriented to person, place, and time.  Psychiatric:        Mood and Affect: Mood normal.        Behavior: Behavior normal.       Assessment/Plan: 1. Acute cystitis with hematuria Antibiotic prescribed, take until gone. Urine culture sent. - CULTURE, URINE COMPREHENSIVE - ciprofloxacin (CIPRO) 500 MG tablet; Take 1 tablet (500 mg total) by mouth 2 (two) times daily for 7 days. Take with food.  Dispense: 14 tablet; Refill: 0   General Counseling: Subrena verbalizes understanding of the findings of todays visit and agrees with plan of treatment. I have discussed any further diagnostic evaluation that may be needed or ordered today. We also reviewed her medications today. she has been encouraged to call the office with any questions or concerns that should arise related to todays visit.    Counseling:    Orders Placed This Encounter  Procedures   CULTURE, URINE COMPREHENSIVE    Meds ordered this encounter  Medications   ciprofloxacin (CIPRO) 500 MG tablet    Sig: Take 1 tablet (500 mg total) by mouth 2 (two) times daily for 7 days. Take with food.    Dispense:  14 tablet    Refill:  0    Return if symptoms worsen or fail to improve.  [place] Controlled Substance Database was reviewed by me for overdose risk score (ORS)  Time spent:20 Minutes Time spent with patient included reviewing progress notes, labs, imaging studies, and discussing plan for follow up.   This patient was seen by Sallyanne Kuster, FNP-C in collaboration with Dr. Beverely Risen as a part of collaborative care agreement.  Keneisha Heckart R. Tedd Sias,  MSN, FNP-C Internal Medicine

## 2023-08-28 DIAGNOSIS — Z961 Presence of intraocular lens: Secondary | ICD-10-CM | POA: Diagnosis not present

## 2023-08-28 DIAGNOSIS — H43813 Vitreous degeneration, bilateral: Secondary | ICD-10-CM | POA: Diagnosis not present

## 2023-08-28 DIAGNOSIS — H04123 Dry eye syndrome of bilateral lacrimal glands: Secondary | ICD-10-CM | POA: Diagnosis not present

## 2023-08-28 DIAGNOSIS — J3089 Other allergic rhinitis: Secondary | ICD-10-CM | POA: Diagnosis not present

## 2023-08-28 DIAGNOSIS — J301 Allergic rhinitis due to pollen: Secondary | ICD-10-CM | POA: Diagnosis not present

## 2023-08-28 DIAGNOSIS — J3081 Allergic rhinitis due to animal (cat) (dog) hair and dander: Secondary | ICD-10-CM | POA: Diagnosis not present

## 2023-08-29 LAB — CULTURE, URINE COMPREHENSIVE

## 2023-09-08 DIAGNOSIS — J3089 Other allergic rhinitis: Secondary | ICD-10-CM | POA: Diagnosis not present

## 2023-09-08 DIAGNOSIS — J301 Allergic rhinitis due to pollen: Secondary | ICD-10-CM | POA: Diagnosis not present

## 2023-09-08 DIAGNOSIS — J3081 Allergic rhinitis due to animal (cat) (dog) hair and dander: Secondary | ICD-10-CM | POA: Diagnosis not present

## 2023-09-18 DIAGNOSIS — J3089 Other allergic rhinitis: Secondary | ICD-10-CM | POA: Diagnosis not present

## 2023-09-18 DIAGNOSIS — J3081 Allergic rhinitis due to animal (cat) (dog) hair and dander: Secondary | ICD-10-CM | POA: Diagnosis not present

## 2023-09-18 DIAGNOSIS — J301 Allergic rhinitis due to pollen: Secondary | ICD-10-CM | POA: Diagnosis not present

## 2023-09-21 DIAGNOSIS — G4733 Obstructive sleep apnea (adult) (pediatric): Secondary | ICD-10-CM | POA: Diagnosis not present

## 2023-09-25 DIAGNOSIS — J301 Allergic rhinitis due to pollen: Secondary | ICD-10-CM | POA: Diagnosis not present

## 2023-09-25 DIAGNOSIS — J3089 Other allergic rhinitis: Secondary | ICD-10-CM | POA: Diagnosis not present

## 2023-09-25 DIAGNOSIS — J3081 Allergic rhinitis due to animal (cat) (dog) hair and dander: Secondary | ICD-10-CM | POA: Diagnosis not present

## 2023-10-01 ENCOUNTER — Encounter: Payer: Self-pay | Admitting: Nurse Practitioner

## 2023-10-01 DIAGNOSIS — E782 Mixed hyperlipidemia: Secondary | ICD-10-CM | POA: Insufficient documentation

## 2023-10-02 DIAGNOSIS — J301 Allergic rhinitis due to pollen: Secondary | ICD-10-CM | POA: Diagnosis not present

## 2023-10-02 DIAGNOSIS — J3081 Allergic rhinitis due to animal (cat) (dog) hair and dander: Secondary | ICD-10-CM | POA: Diagnosis not present

## 2023-10-02 DIAGNOSIS — J3089 Other allergic rhinitis: Secondary | ICD-10-CM | POA: Diagnosis not present

## 2023-12-01 ENCOUNTER — Telehealth: Payer: Self-pay | Admitting: Nurse Practitioner

## 2023-12-01 NOTE — Telephone Encounter (Signed)
 Left vm to confirm 12/08/23 appointment-Toni

## 2023-12-08 ENCOUNTER — Telehealth: Payer: Self-pay | Admitting: Nurse Practitioner

## 2023-12-08 ENCOUNTER — Ambulatory Visit: Payer: 59 | Admitting: Nurse Practitioner

## 2023-12-08 ENCOUNTER — Encounter: Payer: Self-pay | Admitting: Nurse Practitioner

## 2023-12-08 VITALS — BP 130/85 | HR 74 | Temp 98.1°F | Resp 16 | Ht 59.0 in | Wt 170.0 lb

## 2023-12-08 DIAGNOSIS — E782 Mixed hyperlipidemia: Secondary | ICD-10-CM

## 2023-12-08 DIAGNOSIS — E039 Hypothyroidism, unspecified: Secondary | ICD-10-CM | POA: Diagnosis not present

## 2023-12-08 DIAGNOSIS — Z0000 Encounter for general adult medical examination without abnormal findings: Secondary | ICD-10-CM

## 2023-12-08 DIAGNOSIS — Z0001 Encounter for general adult medical examination with abnormal findings: Secondary | ICD-10-CM

## 2023-12-08 DIAGNOSIS — I10 Essential (primary) hypertension: Secondary | ICD-10-CM | POA: Diagnosis not present

## 2023-12-08 DIAGNOSIS — Z01818 Encounter for other preprocedural examination: Secondary | ICD-10-CM

## 2023-12-08 DIAGNOSIS — K121 Other forms of stomatitis: Secondary | ICD-10-CM | POA: Diagnosis not present

## 2023-12-08 DIAGNOSIS — K1230 Oral mucositis (ulcerative), unspecified: Secondary | ICD-10-CM

## 2023-12-08 MED ORDER — NITROGLYCERIN 0.4 MG SL SUBL: 0.4000 mg | SUBLINGUAL_TABLET | SUBLINGUAL | 3 refills | Status: AC | PRN

## 2023-12-08 MED ORDER — LEVOTHYROXINE SODIUM 50 MCG PO TABS
ORAL_TABLET | ORAL | 1 refills | Status: DC
Start: 2023-12-08 — End: 2024-06-09

## 2023-12-08 MED ORDER — ROSUVASTATIN CALCIUM 5 MG PO TABS: 5.0000 mg | ORAL_TABLET | Freq: Every day | ORAL | 1 refills | Status: DC

## 2023-12-08 MED ORDER — LOSARTAN POTASSIUM 25 MG PO TABS
25.0000 mg | ORAL_TABLET | Freq: Two times a day (BID) | ORAL | 1 refills | Status: DC
Start: 2023-12-08 — End: 2024-04-25

## 2023-12-08 MED ORDER — CHLORHEXIDINE GLUCONATE 0.12 % MT SOLN
OROMUCOSAL | 1 refills | Status: AC
Start: 2023-12-08 — End: ?

## 2023-12-08 MED ORDER — METOPROLOL TARTRATE 25 MG PO TABS
25.0000 mg | ORAL_TABLET | Freq: Two times a day (BID) | ORAL | 1 refills | Status: DC
Start: 2023-12-08 — End: 2024-06-09

## 2023-12-08 MED ORDER — TRAMADOL HCL 50 MG PO TABS: 50.0000 mg | ORAL_TABLET | Freq: Four times a day (QID) | ORAL | 0 refills | Status: DC | PRN

## 2023-12-08 MED ORDER — VALACYCLOVIR HCL 1 G PO TABS
ORAL_TABLET | ORAL | 3 refills | Status: AC
Start: 2023-12-08 — End: ?

## 2023-12-08 NOTE — Progress Notes (Signed)
 [facility] [address] [place], [place] [address]  Internal MEDICINE  Office Visit Note  Patient Name: Claudia Martin  [id number]  [id number]  Date of Service: 12/08/2023  Chief Complaint  Patient presents with   Gastroesophageal Reflux   Hypertension   Medicare Wellness    HPI Aleen presents for a medicare annual wellness visit.  Well-appearing 78 y.o. female withhigh cholesterol, prediabetes, hypothyroidism, allergic rhinitis, OSA, vitamin D deficiency, and hx of STEMI.  Routine CRC screening: due in 2027 Routine mammogram:mammogram ordered in October, patient to call to schedule this month  Labs: due for routine labs  New or worsening pain: chronic right knee pain, is having total knee replacement done in April  Other concerns: none  Received both doses of shingles vaccine last year      12/08/2023   11:27 AM 12/03/2022   11:11 AM 12/02/2021   11:23 AM  MMSE - Mini Mental State Exam  Orientation to time 5 5 5   Orientation to Place 5 5 5   Registration 3 3 3   Attention/ Calculation 5 5 5   Recall 3 3 3   Language- name 2 objects 2 2 2   Language- repeat 1 1 1   Language- follow 3 step command 3 3 3   Language- read & follow direction 1 1 1   Write a sentence 0 0 1  Copy design 1 1 1   Total score 29 29 30     Functional Status Survey: Is the patient deaf or have difficulty hearing?: Yes Does the patient have difficulty seeing, even when wearing glasses/contacts?: Yes Does the patient have difficulty concentrating, remembering, or making decisions?: Yes Does the patient have difficulty walking or climbing stairs?: Yes Does the patient have difficulty dressing or bathing?: Yes Does the patient have difficulty doing errands alone such as visiting a doctor's office or shopping?: Yes     09/24/2022   10:27 AM 10/13/2022    1:44 PM 12/03/2022   11:10 AM 03/05/2023   11:45 AM 12/08/2023   11:24 AM  Fall Risk  Falls in the past year? 0 0 0 0 0  Was there  an injury with Fall?   0  0  Fall Risk Category Calculator   0  0  (RETIRED) Patient Fall Risk Level  Moderate fall risk     Patient at Risk for Falls Due to   No Fall Risks  No Fall Risks  Fall risk Follow up   Falls evaluation completed Falls evaluation completed Falls evaluation completed       12/08/2023   11:24 AM  Depression screen PHQ 2/9  Decreased Interest 3  Down, Depressed, Hopeless 0  PHQ - 2 Score 3  Altered sleeping 0  Tired, decreased energy 3  Change in appetite 0  Feeling bad or failure about yourself  0  Trouble concentrating 2  Moving slowly or fidgety/restless 0  Suicidal thoughts 0  PHQ-9 Score 8  Difficult doing work/chores Extremely dIfficult      Current Medication: Outpatient Encounter Medications as of 12/08/2023  Medication Sig Note   aspirin 81 MG chewable tablet Chew 1 tablet (81 mg total) by mouth daily.    cetirizine (ZYRTEC) 10 MG tablet Take 10 mg by mouth every morning.    EPINEPHrine 0.3 mg/0.3 mL IJ SOAJ injection Inject 0.3 mg into the muscle as needed for anaphylaxis. 01/14/2024: prn   [DISCONTINUED] acetaminophen (TYLENOL) 500 MG tablet Take 2 tablets (1,000 mg total) by mouth every 8 (eight) hours. (Patient taking  differently: Take 1,000 mg by mouth every 6 (six) hours as needed for moderate pain (pain score 4-6).)    [DISCONTINUED] acyclovir ointment (ZOVIRAX) 5 % Apply topically.    [DISCONTINUED] chlorhexidine (PERIDEX) 0.12 % solution USE 5 ML AS DIRECTED IN THE MOUTH OR THROAT TWICE DAILY    [DISCONTINUED] levothyroxine (SYNTHROID) 50 MCG tablet TAKE 1 TABLET(50 MCG) BY MOUTH DAILY BEFORE BREAKFAST    [DISCONTINUED] losartan (COZAAR) 25 MG tablet Take 1 tablet (25 mg total) by mouth 2 (two) times daily.    [DISCONTINUED] rosuvastatin (CRESTOR) 5 MG tablet Take 1 tablet (5 mg total) by mouth daily.    [DISCONTINUED] traMADol (ULTRAM) 50 MG tablet Take 1 tablet (50 mg total) by mouth every 6 (six) hours as needed for moderate pain.     [DISCONTINUED] valACYclovir (VALTREX) 1000 MG tablet TAKE 1 TABLET BY MOUTH DAILY. CAN TAKE 1 TABLET BY MOUTH TWICE DAILY IF FLARE UP FOR 7 DAYS    chlorhexidine (PERIDEX) 0.12 % solution USE 5 ML AS DIRECTED IN THE MOUTH OR THROAT TWICE DAILY 01/14/2024: prn   levothyroxine (SYNTHROID) 50 MCG tablet TAKE 1 TABLET(50 MCG) BY MOUTH DAILY BEFORE BREAKFAST    losartan (COZAAR) 25 MG tablet Take 1 tablet (25 mg total) by mouth 2 (two) times daily.    metoprolol tartrate (LOPRESSOR) 25 MG tablet Take 1 tablet (25 mg total) by mouth 2 (two) times daily.    nitroGLYCERIN (NITROSTAT) 0.4 MG SL tablet Place 1 tablet (0.4 mg total) under the tongue every 5 (five) minutes x 3 doses as needed for chest pain. 01/14/2024: prn   rosuvastatin (CRESTOR) 5 MG tablet Take 1 tablet (5 mg total) by mouth daily. (Patient taking differently: Take 5 mg by mouth every evening.)    valACYclovir (VALTREX) 1000 MG tablet TAKE 1 TABLET BY MOUTH DAILY. CAN TAKE 1 TABLET BY MOUTH TWICE DAILY IF FLARE UP FOR 7 DAYS    [DISCONTINUED] metoprolol tartrate (LOPRESSOR) 25 MG tablet Take 1 tablet (25 mg total) by mouth 2 (two) times daily.    [DISCONTINUED] nitroGLYCERIN (NITROSTAT) 0.4 MG SL tablet Place 1 tablet (0.4 mg total) under the tongue every 5 (five) minutes x 3 doses as needed for chest pain.    [DISCONTINUED] traMADol (ULTRAM) 50 MG tablet Take 1 tablet (50 mg total) by mouth every 6 (six) hours as needed for moderate pain (pain score 4-6) or severe pain (pain score 7-10). (Patient not taking: Reported on 12/28/2023)    No facility-administered encounter medications on file as of 12/08/2023.    Surgical History: Past Surgical History:  Procedure Laterality Date   ABDOMINAL WALL DEFECT REPAIR N/A 05/27/2022   Procedure: REPAIR ABDOMINAL WALL, reconstruction;  Surgeon: Alben Alma, MD;  Location: [facility] ORS;  Service: General;  Laterality: N/A;   BREAST BIOPSY Bilateral    benign   CATARACT EXTRACTION W/PHACO Right 06/05/2021    Procedure: CATARACT EXTRACTION PHACO AND INTRAOCULAR LENS PLACEMENT (IOC) RIGHT;  Surgeon: Annell Kidney, MD;  Location: [facility];  Service: Ophthalmology;  Laterality: Right;  3.02 00:39.5   CATARACT EXTRACTION W/PHACO Left 12/04/2021   Procedure: CATARACT EXTRACTION PHACO AND INTRAOCULAR LENS PLACEMENT (IOC) LEFT;  Surgeon: Annell Kidney, MD;  Location: [facility];  Service: Ophthalmology;  Laterality: Left;  6.23 00:48.0   COLON SURGERY     Diverticulitis   COLONOSCOPY WITH PROPOFOL N/A 03/15/2018   Procedure: COLONOSCOPY WITH PROPOFOL;  Surgeon: Selena Daily, MD;  Location: [facility];  Service: Gastroenterology;  Laterality:  N/A;   COLONOSCOPY WITH PROPOFOL N/A 01/09/2021   Procedure: COLONOSCOPY WITH PROPOFOL;  Surgeon: Selena Daily, MD;  Location: [facility];  Service: Gastroenterology;  Laterality: N/A;   CORONARY/GRAFT ACUTE MI REVASCULARIZATION N/A 06/06/2021   Procedure: Coronary/Graft Acute MI Revascularization;  Surgeon: Antonette Batters, MD;  Location: [facility] INVASIVE CV LAB;  Service: Cardiovascular;  Laterality: N/A;   ECTOPIC PREGNANCY SURGERY     fx thumb     HERNIA REPAIR     INSERTION OF MESH N/A 05/27/2022   Procedure: INSERTION OF MESH;  Surgeon: Alben Alma, MD;  Location: [facility];  Service: General;  Laterality: N/A;   LEFT HEART CATH AND CORONARY ANGIOGRAPHY N/A 06/06/2021   Procedure: LEFT HEART CATH AND CORONARY ANGIOGRAPHY;  Surgeon: Antonette Batters, MD;  Location: [facility] CV [facility];  Service: Cardiovascular;  Laterality: N/A;   TOTAL KNEE ARTHROPLASTY Left 01/26/2023   Procedure: TOTAL KNEE ARTHROPLASTY;  Surgeon: Venus Ginsberg, MD;  Location: [facility];  Service: Orthopedics;  Laterality: Left;   TOTAL KNEE ARTHROPLASTY Right 01/14/2024   Procedure: ARTHROPLASTY, KNEE, TOTAL;  Surgeon: Venus Ginsberg, MD;  Location: [facility];  Service: Orthopedics;  Laterality: Right;   VENTRAL HERNIA REPAIR  N/A 05/27/2022   Procedure: HERNIA REPAIR VENTRAL ADULT, recurrent;  Surgeon: Alben Alma, MD;  Location: [facility];  Service: General;  Laterality: N/A;    Medical History: Past Medical History:  Diagnosis Date   Allergic rhinitis    Aortic atherosclerosis (HCC)    Arthritis    Bifascicular block (RBBB + LAFB)    CAD (coronary artery disease) 06/06/2021   a.) STEMI --> LHC: EF 55-65%, 100% RI --> PCI performed placing a 2.5 x 18 mm Onyx Frontier DES   Cellulitis and abscess    Diastolic dysfunction 06/07/2021   a.) TTE 06/07/2021: EF 55-60%, no RWMAs, triv MR, mild AoV sclerosis with no stenosis, G1DD; b.) TTE 07/17/2021: EF >55%, mild LVH, no RWMAs; triv TR, mild MR, G1DD   Diverticulosis    Enlarged heart    Eyelid inflammation    Frequent nosebleeds    GERD (gastroesophageal reflux disease)    Hematuria syndrome    Hemorrhoids    Herpes genitalis    Herpes simplex    History of bilateral cataract extraction    History of colonic polyps    History of methicillin resistant staphylococcus aureus (MRSA) 2021   HTN (hypertension)    Hydronephrosis    Hyperlipidemia    Hypothyroidism    Incomplete bladder emptying    Internal hordeolum    Long term current use of antithrombotics/antiplatelets    a.) on daily DAPT therapy (ASA + ticagrelor) x 1 year s/p STEMI; ticagrelor has been discontinued   OSA on CPAP    PONV (postoperative nausea and vomiting)    Primary ovarian failure    Sciatica    Shortness of breath    Sinus bradycardia    STEMI (ST elevation myocardial infarction) (HCC) 06/06/2021   a.) LHC/PCI 06/06/2021 --> 100% RI (2.5 x 18 mm Onyx Frontier DES)   Urethral diverticulum    Vaginal atrophy    Vitamin B12 deficiency    Vitamin D deficiency     Family History: Family History  Problem Relation Age of Onset   Tuberculosis Mother    Nephrolithiasis Brother    Vision loss Daughter    Hypertension Daughter     Social History   Socioeconomic History    Marital status: Divorced  Spouse name: Not on file   Number of children: Not on file   Years of education: Not on file   Highest education level: Not on file  Occupational History   Not on file  Tobacco Use   Smoking status: Never    Passive exposure: Never   Smokeless tobacco: Never  Vaping Use   Vaping status: Never Used  Substance and Sexual Activity   Alcohol use: No   Drug use: Never   Sexual activity: Not on file  Other Topics Concern   Not on file  Social History Narrative   Not on file   Social Drivers of Health   Financial Resource Strain: Not on file  Food Insecurity: No Food Insecurity (01/14/2024)   Hunger Vital Sign    Worried About Running Out of Food in the Last Year: Never true    Ran Out of Food in the Last Year: Never true  Transportation Needs: No Transportation Needs (01/14/2024)   PRAPARE - Administrator, Civil Service (Medical): No    Lack of Transportation (Non-Medical): No  Physical Activity: Not on file  Stress: Not on file  Social Connections: Socially Isolated (01/14/2024)   Social Connection and Isolation Panel [NHANES]    Frequency of Communication with Friends and Family: Once a week    Frequency of Social Gatherings with Friends and Family: Once a week    Attends Religious Services: Never    Database administrator or Organizations: No    Attends Banker Meetings: Never    Marital Status: Divorced  Catering manager Violence: Not At Risk (01/14/2024)   Humiliation, Afraid, Rape, and Kick questionnaire    Fear of Current or Ex-Partner: No    Emotionally Abused: No    Physically Abused: No    Sexually Abused: No      Review of Systems  Constitutional:  Negative for activity change, appetite change, chills, fatigue, fever and unexpected weight change.  HENT: Negative.  Negative for congestion, ear pain, rhinorrhea, sore throat and trouble swallowing.   Eyes: Negative.   Respiratory: Negative.  Negative for  cough, chest tightness, shortness of breath and wheezing.   Cardiovascular: Negative.  Negative for chest pain.  Gastrointestinal: Negative.  Negative for abdominal pain, blood in stool, constipation, diarrhea, nausea and vomiting.  Endocrine: Negative.   Genitourinary: Negative.  Negative for difficulty urinating, dysuria, frequency, hematuria and urgency.  Musculoskeletal: Negative.  Negative for arthralgias, back pain, joint swelling, myalgias and neck pain.  Skin: Negative.  Negative for rash and wound.  Allergic/Immunologic: Negative.  Negative for immunocompromised state.  Neurological: Negative.  Negative for dizziness, seizures, numbness and headaches.  Hematological: Negative.   Psychiatric/Behavioral: Negative.  Negative for behavioral problems, self-injury and suicidal ideas. The patient is not nervous/anxious.     Vital Signs: BP 130/85 Comment: 150/81  Pulse 74   Temp 98.1 F (36.7 C)   Resp 16   Ht 4\' 11"  (1.499 m)   Wt 170 lb (77.1 kg)   SpO2 97%   BMI 34.34 kg/m    Physical Exam Vitals reviewed.  Constitutional:      General: She is not in acute distress.    Appearance: Normal appearance. She is well-developed. She is obese. She is not ill-appearing or diaphoretic.  HENT:     Head: Normocephalic and atraumatic.     Nose: Nose normal. No congestion or rhinorrhea.     Mouth/Throat:     Mouth: Mucous membranes are moist.  Pharynx: Oropharynx is clear. No oropharyngeal exudate or posterior oropharyngeal erythema.  Eyes:     Extraocular Movements: Extraocular movements intact.     Conjunctiva/sclera: Conjunctivae normal.     Pupils: Pupils are equal, round, and reactive to light.  Neck:     Thyroid: No thyromegaly.     Vascular: No JVD.     Trachea: No tracheal deviation.  Cardiovascular:     Rate and Rhythm: Normal rate and regular rhythm.     Pulses: Normal pulses.     Heart sounds: Normal heart sounds. No murmur heard.    No friction rub. No gallop.   Pulmonary:     Effort: Pulmonary effort is normal. No respiratory distress.     Breath sounds: Normal breath sounds. No stridor. No wheezing or rales.  Chest:     Chest wall: No tenderness.  Skin:    General: Skin is warm and dry.     Capillary Refill: Capillary refill takes less than 2 seconds.     Coloration: Skin is not pale.  Neurological:     Mental Status: She is alert and oriented to person, place, and time.     Cranial Nerves: No cranial nerve deficit.     Motor: No abnormal muscle tone.     Coordination: Coordination normal.     Gait: Gait normal.     Deep Tendon Reflexes: Reflexes are normal and symmetric.  Psychiatric:        Mood and Affect: Mood normal.        Behavior: Behavior normal.        Assessment/Plan: 1. Encounter for subsequent annual wellness visit (AWV) in Medicare patient (Primary) Age-appropriate preventive screenings and vaccinations discussed. Routine labs for health maintenance deferred for now since she has preop lab testing coming up. PHM updated.   - nitroGLYCERIN (NITROSTAT) 0.4 MG SL tablet; Place 1 tablet (0.4 mg total) under the tongue every 5 (five) minutes x 3 doses as needed for chest pain.  Dispense: 30 tablet; Refill: 3 - valACYclovir (VALTREX) 1000 MG tablet; TAKE 1 TABLET BY MOUTH DAILY. CAN TAKE 1 TABLET BY MOUTH TWICE DAILY IF FLARE UP FOR 7 DAYS  Dispense: 90 tablet; Refill: 3  2. Essential hypertension Stable, continue losartan and metoprolol as prescribed.  - metoprolol tartrate (LOPRESSOR) 25 MG tablet; Take 1 tablet (25 mg total) by mouth 2 (two) times daily.  Dispense: 180 tablet; Refill: 1 - losartan (COZAAR) 25 MG tablet; Take 1 tablet (25 mg total) by mouth 2 (two) times daily.  Dispense: 180 tablet; Refill: 1  3. Acquired hypothyroidism Continue levothyroxine as prescribed.  - levothyroxine (SYNTHROID) 50 MCG tablet; TAKE 1 TABLET(50 MCG) BY MOUTH DAILY BEFORE BREAKFAST  Dispense: 90 tablet; Refill: 1  4. Mixed  hyperlipidemia Continue rosuvastatin as prescribed.  - rosuvastatin (CRESTOR) 5 MG tablet; Take 1 tablet (5 mg total) by mouth daily. (Patient taking differently: Take 5 mg by mouth every evening.)  Dispense: 90 tablet; Refill: 1  5. Stomatitis and mucositis Continue chlorhexidine mouthwash as prescribed.  - chlorhexidine (PERIDEX) 0.12 % solution; USE 5 ML AS DIRECTED IN THE MOUTH OR THROAT TWICE DAILY  Dispense: 473 mL; Refill: 1  6. Preoperative clearance Cleared medically for surgery, form faxed to the surgeon. No respiratory conditions.      General Counseling: chenae brager understanding of the findings of todays visit and agrees with plan of treatment. I have discussed any further diagnostic evaluation that may be needed or ordered today. We also  reviewed her medications today. she has been encouraged to call the office with any questions or concerns that should arise related to todays visit.    No orders of the defined types were placed in this encounter.   Meds ordered this encounter  Medications   nitroGLYCERIN (NITROSTAT) 0.4 MG SL tablet    Sig: Place 1 tablet (0.4 mg total) under the tongue every 5 (five) minutes x 3 doses as needed for chest pain.    Dispense:  30 tablet    Refill:  3   metoprolol tartrate (LOPRESSOR) 25 MG tablet    Sig: Take 1 tablet (25 mg total) by mouth 2 (two) times daily.    Dispense:  180 tablet    Refill:  1   rosuvastatin (CRESTOR) 5 MG tablet    Sig: Take 1 tablet (5 mg total) by mouth daily.    Dispense:  90 tablet    Refill:  1   DISCONTD: traMADol (ULTRAM) 50 MG tablet    Sig: Take 1 tablet (50 mg total) by mouth every 6 (six) hours as needed for moderate pain (pain score 4-6) or severe pain (pain score 7-10).    Dispense:  20 tablet    Refill:  0    Initial fill, may request a refill   valACYclovir (VALTREX) 1000 MG tablet    Sig: TAKE 1 TABLET BY MOUTH DAILY. CAN TAKE 1 TABLET BY MOUTH TWICE DAILY IF FLARE UP FOR 7 DAYS     Dispense:  90 tablet    Refill:  3   levothyroxine (SYNTHROID) 50 MCG tablet    Sig: TAKE 1 TABLET(50 MCG) BY MOUTH DAILY BEFORE BREAKFAST    Dispense:  90 tablet    Refill:  1   losartan (COZAAR) 25 MG tablet    Sig: Take 1 tablet (25 mg total) by mouth 2 (two) times daily.    Dispense:  180 tablet    Refill:  1   chlorhexidine (PERIDEX) 0.12 % solution    Sig: USE 5 ML AS DIRECTED IN THE MOUTH OR THROAT TWICE DAILY    Dispense:  473 mL    Refill:  1    For future refills    Return in about 6 months (around 06/09/2024) for F/U, Alisah Grandberry PCP.   Total time spent:30 Minutes Time spent includes review of chart, medications, test results, and follow up plan with the patient.   [place] Controlled Substance Database was reviewed by me.  This patient was seen by Laurence Pons, FNP-C in collaboration with Dr. Verneta Gone as a part of collaborative care agreement.  Emmarie Sannes R. Bobbi Burow, MSN, FNP-C Internal medicine

## 2023-12-08 NOTE — Telephone Encounter (Signed)
 Surgical clearance faxed back to [facility]; [phone]. Scanned-Toni

## 2023-12-14 ENCOUNTER — Ambulatory Visit (INDEPENDENT_AMBULATORY_CARE_PROVIDER_SITE_OTHER): Payer: 59 | Admitting: Physician Assistant

## 2023-12-14 ENCOUNTER — Encounter: Payer: Self-pay | Admitting: Physician Assistant

## 2023-12-14 VITALS — BP 138/67 | HR 66 | Temp 98.5°F | Resp 16 | Ht 59.0 in | Wt 170.0 lb

## 2023-12-14 DIAGNOSIS — E669 Obesity, unspecified: Secondary | ICD-10-CM | POA: Diagnosis not present

## 2023-12-14 DIAGNOSIS — Z7189 Other specified counseling: Secondary | ICD-10-CM

## 2023-12-14 DIAGNOSIS — I10 Essential (primary) hypertension: Secondary | ICD-10-CM | POA: Diagnosis not present

## 2023-12-14 DIAGNOSIS — G4733 Obstructive sleep apnea (adult) (pediatric): Secondary | ICD-10-CM | POA: Diagnosis not present

## 2023-12-14 NOTE — Progress Notes (Signed)
 [facility] [address] [place], [place] [address]  Pulmonary Sleep Medicine   Office Visit Note  Patient Name: Claudia Martin DOB: 1946-04-29 MRN [id number]  Date of Service: 12/14/2023  Complaints/HPI: Pt is here for routine pulmonary follow up. Pt is using and benefiting from CPAP use. She is using CPAP nightly and is changing out supplies regularly. She is using AHP. She is also keeping in clean. Does have some dryness, but states this is all day not just with cpap and knows she needs to drink more water. Denies SOB or headaches.  CPAP compliance 11/14/23-12/13/23 Use days 30/30 >4 hours 100% Avg use 6.5 hours Settings APAP 5-10cm H2O AHI 1.3  ROS  General: (-) fever, (-) chills, (-) night sweats, (-) weakness Skin: (-) rashes, (-) itching,. Eyes: (-) visual changes, (-) redness, (-) itching. Nose and Sinuses: (-) nasal stuffiness or itchiness, (-) postnasal drip, (-) nosebleeds, (-) sinus trouble. Mouth and Throat: (-) sore throat, (-) hoarseness. Neck: (-) swollen glands, (-) enlarged thyroid, (-) neck pain. Respiratory: - cough, (-) bloody sputum, - shortness of breath, - wheezing. Cardiovascular: - ankle swelling, (-) chest pain. Lymphatic: (-) lymph node enlargement. Neurologic: (-) numbness, (-) tingling. Psychiatric: (-) anxiety, (-) depression   Current Medication: Outpatient Encounter Medications as of 12/14/2023  Medication Sig   acetaminophen (TYLENOL) 500 MG tablet Take 2 tablets (1,000 mg total) by mouth every 8 (eight) hours.   acyclovir ointment (ZOVIRAX) 5 % Apply topically.   aspirin 81 MG chewable tablet Chew 1 tablet (81 mg total) by mouth daily.   cetirizine (ZYRTEC) 10 MG tablet Take 10 mg by mouth daily.   chlorhexidine (PERIDEX) 0.12 % solution USE 5 ML AS DIRECTED IN THE MOUTH OR THROAT TWICE DAILY   EPINEPHrine 0.3 mg/0.3 mL IJ SOAJ injection as needed.   levothyroxine (SYNTHROID) 50 MCG tablet TAKE 1 TABLET(50 MCG) BY MOUTH  DAILY BEFORE BREAKFAST   losartan (COZAAR) 25 MG tablet Take 1 tablet (25 mg total) by mouth 2 (two) times daily.   metoprolol tartrate (LOPRESSOR) 25 MG tablet Take 1 tablet (25 mg total) by mouth 2 (two) times daily.   nitroGLYCERIN (NITROSTAT) 0.4 MG SL tablet Place 1 tablet (0.4 mg total) under the tongue every 5 (five) minutes x 3 doses as needed for chest pain.   rosuvastatin (CRESTOR) 5 MG tablet Take 1 tablet (5 mg total) by mouth daily.   traMADol (ULTRAM) 50 MG tablet Take 1 tablet (50 mg total) by mouth every 6 (six) hours as needed for moderate pain (pain score 4-6) or severe pain (pain score 7-10).   valACYclovir (VALTREX) 1000 MG tablet TAKE 1 TABLET BY MOUTH DAILY. CAN TAKE 1 TABLET BY MOUTH TWICE DAILY IF FLARE UP FOR 7 DAYS   No facility-administered encounter medications on file as of 12/14/2023.    Surgical History: Past Surgical History:  Procedure Laterality Date   ABDOMINAL WALL DEFECT REPAIR N/A 05/27/2022   Procedure: REPAIR ABDOMINAL WALL, reconstruction;  Surgeon: Leafy Ro, MD;  Location: [facility];  Service: General;  Laterality: N/A;   BREAST BIOPSY Bilateral    benign   CATARACT EXTRACTION W/PHACO Right 06/05/2021   Procedure: CATARACT EXTRACTION PHACO AND INTRAOCULAR LENS PLACEMENT (IOC) RIGHT;  Surgeon: Lockie Mola, MD;  Location: [facility];  Service: Ophthalmology;  Laterality: Right;  3.02 00:39.5   CATARACT EXTRACTION W/PHACO Left 12/04/2021   Procedure: CATARACT EXTRACTION PHACO AND INTRAOCULAR LENS PLACEMENT (IOC) LEFT;  Surgeon: Lockie Mola, MD;  Location: [facility];  Service: Ophthalmology;  Laterality: Left;  6.23 00:48.0   COLON SURGERY     COLONOSCOPY WITH PROPOFOL N/A 03/15/2018   Procedure: COLONOSCOPY WITH PROPOFOL;  Surgeon: Toney Reil, MD;  Location: [facility];  Service: Gastroenterology;  Laterality: N/A;   COLONOSCOPY WITH PROPOFOL N/A 01/09/2021   Procedure: COLONOSCOPY WITH PROPOFOL;   Surgeon: Toney Reil, MD;  Location: [facility] ENDOSCOPY;  Service: Gastroenterology;  Laterality: N/A;   CORONARY/GRAFT ACUTE MI REVASCULARIZATION N/A 06/06/2021   Procedure: Coronary/Graft Acute MI Revascularization;  Surgeon: Alwyn Pea, MD;  Location: [facility];  Service: Cardiovascular;  Laterality: N/A;   ECTOPIC PREGNANCY SURGERY     fx thumb     HERNIA REPAIR     INSERTION OF MESH N/A 05/27/2022   Procedure: INSERTION OF MESH;  Surgeon: Leafy Ro, MD;  Location: [facility];  Service: General;  Laterality: N/A;   LEFT HEART CATH AND CORONARY ANGIOGRAPHY N/A 06/06/2021   Procedure: LEFT HEART CATH AND CORONARY ANGIOGRAPHY;  Surgeon: Alwyn Pea, MD;  Location: [facility];  Service: Cardiovascular;  Laterality: N/A;   TOTAL KNEE ARTHROPLASTY Left 01/26/2023   Procedure: TOTAL KNEE ARTHROPLASTY;  Surgeon: Reinaldo Berber, MD;  Location: [facility];  Service: Orthopedics;  Laterality: Left;   VENTRAL HERNIA REPAIR N/A 05/27/2022   Procedure: HERNIA REPAIR VENTRAL ADULT, recurrent;  Surgeon: Leafy Ro, MD;  Location: [facility];  Service: General;  Laterality: N/A;    Medical History: Past Medical History:  Diagnosis Date   Allergic rhinitis    Aortic atherosclerosis (HCC)    Arthritis    CAD (coronary artery disease) 06/06/2021   a.) STEMI --> LHC: EF 55-65%, 100% RI --> PCI performed placing a 2.5 x 18 mm Onyx Frontier DES   Cellulitis and abscess    Diastolic dysfunction 06/07/2021   a.) TTE 06/07/2021: EF 55-60%, no RWMAs, triv MR, mild AoV sclerosis with no stenosis, G1DD; b.) TTE 07/17/2021: EF >55%, mild LVH, no RWMAs; triv TR, mild MR, G1DD   Diverticulosis    Enlarged heart    Eyelid inflammation    GERD (gastroesophageal reflux disease)    Hematuria syndrome    Hemorrhoids    Herpes genitalis    Herpes simplex    HTN (hypertension)    Hydronephrosis    Hypothyroidism    Incomplete bladder emptying    Internal hordeolum     Long term current use of antithrombotics/antiplatelets    a.) on daily DAPT therapy (ASA + ticagrelor) x 1 year s/p STEMI; ticagrelor has been discontinued   OSA on CPAP    Primary ovarian failure    Sciatica    Shortness of breath    Sinus bradycardia    STEMI (ST elevation myocardial infarction) (HCC) 06/06/2021   a.) LHC/PCI 06/06/2021 --> 100% RI (2.5 x 18 mm Onyx Frontier DES)   Urethral diverticulum    Vaginal atrophy     Family History: Family History  Problem Relation Age of Onset   Tuberculosis Mother    Nephrolithiasis Brother    Vision loss Daughter    Hypertension Daughter     Social History: Social History   Socioeconomic History   Marital status: Divorced    Spouse name: Not on file   Number of children: Not on file   Years of education: Not on file   Highest education level: Not on file  Occupational History   Not on file  Tobacco Use  Smoking status: Never    Passive exposure: Never   Smokeless tobacco: Never  Vaping Use   Vaping status: Never Used  Substance and Sexual Activity   Alcohol use: No   Drug use: Never   Sexual activity: Not on file  Other Topics Concern   Not on file  Social History Narrative   Not on file   Social Drivers of Health   Financial Resource Strain: Not on file  Food Insecurity: No Food Insecurity (01/26/2023)   Hunger Vital Sign    Worried About Running Out of Food in the Last Year: Never true    Ran Out of Food in the Last Year: Never true  Transportation Needs: No Transportation Needs (01/26/2023)   PRAPARE - Administrator, Civil Service (Medical): No    Lack of Transportation (Non-Medical): No  Physical Activity: Not on file  Stress: Not on file  Social Connections: Not on file  Intimate Partner Violence: Not At Risk (01/26/2023)   Humiliation, Afraid, Rape, and Kick questionnaire    Fear of Current or Ex-Partner: No    Emotionally Abused: No    Physically Abused: No    Sexually Abused: No     Vital Signs: Blood pressure 138/67, pulse 66, temperature 98.5 F (36.9 C), resp. rate 16, height 4\' 11"  (1.499 m), weight 170 lb (77.1 kg), SpO2 96%.  Examination: General Appearance: The patient is well-developed, well-nourished, and in no distress. Skin: Gross inspection of skin unremarkable. Head: normocephalic, no gross deformities. Eyes: no gross deformities noted. ENT: ears appear grossly normal no exudates. Neck: Supple. No thyromegaly. No LAD. Respiratory: Lungs clear to auscultation. Cardiovascular: Normal S1 and S2 without murmur or rub. Extremities: No cyanosis. pulses are equal. Neurologic: Alert and oriented. No involuntary movements.  LABS: No results found for this or any previous visit (from the past 2160 hours).  Radiology: DG Knee Left Port Result Date: 01/26/2023 CLINICAL DATA:  Status post total left knee arthroplasty. EXAM: PORTABLE LEFT KNEE - 1-2 VIEW COMPARISON:  Left knee radiograph 03/28/2010 FINDINGS: Left total knee prosthesis with cemented femoral and tibial components is in appropriate position without evidence of complication. Small suprapatellar joint effusion. A small amount air is seen within the knee joint, consistent with recent postoperative status. IMPRESSION: Expected postoperative changes of left total knee arthroplasty. Electronically Signed   By: Sherron Ales M.D.   On: 01/26/2023 10:25    No results found.  No results found.    Assessment and Plan: Patient Active Problem List   Diagnosis Date Noted   Mixed hyperlipidemia 10/01/2023   S/P TKR (total knee replacement) using cement, left 01/26/2023   Allergic rhinitis 07/02/2022   Wound dehiscence    S/P hernia repair 05/27/2022   Chronic allergic conjunctivitis 12/02/2021   Allergic rhinitis due to animal (cat) (dog) hair and dander 12/02/2021   Allergic rhinitis due to pollen 12/02/2021   STEMI (ST elevation myocardial infarction) (HCC) 06/06/2021   History of colonic polyps     Candidiasis of skin 01/04/2020   Ventral hernia without obstruction or gangrene 11/28/2019   Herpes simplex infection 10/27/2019   Stomatitis and mucositis 10/27/2019   Vitamin B12 deficiency 06/30/2018   Unspecified menopausal and perimenopausal disorder 06/30/2018   Vitamin D deficiency 06/30/2018   Acquired hypothyroidism 02/05/2018   Essential hypertension 02/05/2018   Obstructive sleep apnea (adult) (pediatric) 01/06/2018   1. Obstructive sleep apnea [G47.33] (Primary) Continue excellent compliance  2. CPAP use counseling CPAP couseling-Discussed importance of adequate  CPAP use as well as proper care and cleaning techniques of machine and all supplies.  3. Essential hypertension Continue current medication and f/u with PCP.  4. Obesity (BMI 30-39.9) Obesity Counseling: Had a lengthy discussion regarding patients BMI and weight issues. Patient was instructed on portion control as well as increased activity. Also discussed caloric restrictions with trying to maintain intake less than 2000 Kcal. Discussions were made in accordance with the 5As of weight management. Simple actions such as not eating late and if able to, taking a walk is suggested.    General Counseling: I have discussed the findings of the evaluation and examination with Claudia Martin.  I have also discussed any further diagnostic evaluation thatmay be needed or ordered today. Claudia Martin verbalizes understanding of the findings of todays visit. We also reviewed her medications today and discussed drug interactions and side effects including but not limited excessive drowsiness and altered mental states. We also discussed that there is always a risk not just to her but also people around her. she has been encouraged to call the office with any questions or concerns that should arise related to todays visit.  No orders of the defined types were placed in this encounter.    Time spent: 30  I have personally obtained a history,  examined the patient, evaluated laboratory and imaging results, formulated the assessment and plan and placed orders. This patient was seen by Lynn Ito, PA-C in collaboration with Dr. Freda Munro as a part of collaborative care agreement.     Yevonne Pax, MD [facility] Pulmonary and Critical Care Sleep medicine

## 2023-12-15 ENCOUNTER — Other Ambulatory Visit: Payer: Self-pay | Admitting: Orthopedic Surgery

## 2024-01-01 ENCOUNTER — Other Ambulatory Visit: Payer: Self-pay

## 2024-01-01 ENCOUNTER — Encounter: Payer: Self-pay | Admitting: Physician Assistant

## 2024-01-01 ENCOUNTER — Ambulatory Visit (INDEPENDENT_AMBULATORY_CARE_PROVIDER_SITE_OTHER): Admitting: Physician Assistant

## 2024-01-01 ENCOUNTER — Encounter
Admission: RE | Admit: 2024-01-01 | Discharge: 2024-01-01 | Disposition: A | Discharge status: Home / Self Care | Source: Ambulatory Visit | Attending: Orthopedic Surgery | Admitting: Orthopedic Surgery

## 2024-01-01 VITALS — BP 141/64 | HR 75 | Resp 14 | Ht 59.0 in | Wt 169.8 lb

## 2024-01-01 VITALS — BP 168/97 | HR 88 | Temp 98.5°F | Resp 16 | Ht 59.0 in | Wt 165.0 lb

## 2024-01-01 DIAGNOSIS — R21 Rash and other nonspecific skin eruption: Secondary | ICD-10-CM

## 2024-01-01 DIAGNOSIS — Z01818 Encounter for other preprocedural examination: Secondary | ICD-10-CM | POA: Diagnosis present

## 2024-01-01 DIAGNOSIS — I452 Bifascicular block: Secondary | ICD-10-CM | POA: Insufficient documentation

## 2024-01-01 DIAGNOSIS — L299 Pruritus, unspecified: Secondary | ICD-10-CM | POA: Diagnosis not present

## 2024-01-01 DIAGNOSIS — R8271 Bacteriuria: Secondary | ICD-10-CM | POA: Insufficient documentation

## 2024-01-01 DIAGNOSIS — Z01812 Encounter for preprocedural laboratory examination: Secondary | ICD-10-CM

## 2024-01-01 DIAGNOSIS — R8281 Pyuria: Secondary | ICD-10-CM | POA: Diagnosis not present

## 2024-01-01 DIAGNOSIS — R8290 Unspecified abnormal findings in urine: Secondary | ICD-10-CM | POA: Insufficient documentation

## 2024-01-01 HISTORY — DX: Personal history of colon polyps, unspecified: Z86.0100

## 2024-01-01 HISTORY — DX: Vitamin D deficiency, unspecified: E55.9

## 2024-01-01 HISTORY — DX: Epistaxis: R04.0

## 2024-01-01 HISTORY — DX: Other specified postprocedural states: Z98.890

## 2024-01-01 HISTORY — DX: Deficiency of other specified B group vitamins: E53.8

## 2024-01-01 HISTORY — DX: Hyperlipidemia, unspecified: E78.5

## 2024-01-01 HISTORY — DX: Bifascicular block: I45.2

## 2024-01-01 LAB — CBC WITH DIFFERENTIAL/PLATELET
Abs Immature Granulocytes: 0.01 10*3/uL (ref 0.00–0.07)
Basophils Absolute: 0 10*3/uL (ref 0.0–0.1)
Basophils Relative: 0 %
Eosinophils Absolute: 0.1 10*3/uL (ref 0.0–0.5)
Eosinophils Relative: 2 %
HCT: 34.2 % — ABNORMAL LOW (ref 36.0–46.0)
Hemoglobin: 11.7 g/dL — ABNORMAL LOW (ref 12.0–15.0)
Immature Granulocytes: 0 %
Lymphocytes Relative: 21 %
Lymphs Abs: 1.1 10*3/uL (ref 0.7–4.0)
MCH: 31 pg (ref 26.0–34.0)
MCHC: 34.2 g/dL (ref 30.0–36.0)
MCV: 90.7 fL (ref 80.0–100.0)
Monocytes Absolute: 0.4 10*3/uL (ref 0.1–1.0)
Monocytes Relative: 7 %
Neutro Abs: 3.7 10*3/uL (ref 1.7–7.7)
Neutrophils Relative %: 70 %
Platelets: 216 10*3/uL (ref 150–400)
RBC: 3.77 MIL/uL — ABNORMAL LOW (ref 3.87–5.11)
RDW: 13.4 % (ref 11.5–15.5)
WBC: 5.3 10*3/uL (ref 4.0–10.5)
nRBC: 0 % (ref 0.0–0.2)

## 2024-01-01 LAB — URINALYSIS, ROUTINE W REFLEX MICROSCOPIC
Bilirubin Urine: NEGATIVE
Glucose, UA: NEGATIVE mg/dL
Hgb urine dipstick: NEGATIVE
Ketones, ur: NEGATIVE mg/dL
Nitrite: NEGATIVE
Protein, ur: NEGATIVE mg/dL
Specific Gravity, Urine: 1.025 (ref 1.005–1.030)
pH: 5 (ref 5.0–8.0)

## 2024-01-01 LAB — COMPREHENSIVE METABOLIC PANEL WITH GFR
ALT: 11 U/L (ref 0–44)
AST: 17 U/L (ref 15–41)
Albumin: 4 g/dL (ref 3.5–5.0)
Alkaline Phosphatase: 69 U/L (ref 38–126)
Anion gap: 10 (ref 5–15)
BUN: 25 mg/dL — ABNORMAL HIGH (ref 8–23)
CO2: 25 mmol/L (ref 22–32)
Calcium: 9.2 mg/dL (ref 8.9–10.3)
Chloride: 106 mmol/L (ref 98–111)
Creatinine, Ser: 0.69 mg/dL (ref 0.44–1.00)
GFR, Estimated: >60 mL/min (ref >60)
Glucose, Bld: 93 mg/dL (ref 70–99)
Potassium: 3.3 mmol/L — ABNORMAL LOW (ref 3.5–5.1)
Sodium: 141 mmol/L (ref 135–145)
Total Bilirubin: 0.5 mg/dL (ref 0.0–1.2)
Total Protein: 7.8 g/dL (ref 6.5–8.1)

## 2024-01-01 MED ORDER — HYDROXYZINE PAMOATE 25 MG PO CAPS: 25.0000 mg | ORAL_CAPSULE | Freq: Three times a day (TID) | ORAL | 0 refills | Status: AC | PRN

## 2024-01-01 MED ORDER — NYSTATIN-TRIAMCINOLONE 100000-0.1 UNIT/GM-% EX OINT
1.0000 | TOPICAL_OINTMENT | Freq: Two times a day (BID) | CUTANEOUS | 0 refills | Status: DC
Start: 2024-01-01 — End: 2024-06-09

## 2024-01-01 NOTE — Progress Notes (Signed)
 Pt here for anesthesia interview for upcoming orthopedic surgery. Pt states that she has developed a rash on her right groin, Abdominal area and under left arm. Pt states that this itches very bad and has been itching for 1 week after having her mammogram. Pt  skin very dry on abdomen and pt has reddened area to right groin. Pt informed to call her pcp to see if maybe they can see her before her knee surgery. Pt verbalized understanding

## 2024-01-01 NOTE — Patient Instructions (Addendum)
 Your procedure is scheduled on:01-14-24 Thursday Report to the Registration Desk on the 1st floor of the [facility].Then proceed to the 2nd floor Surgery Desk To find out your arrival time, please call [phone] between 1PM - 3PM on:01-13-24 Wednesday If your arrival time is 6:00 am, do not arrive before that time as the [facility] entrance doors do not open until 6:00 am.  REMEMBER: Instructions that are not followed completely may result in serious medical risk, up to and including death; or upon the discretion of your surgeon and anesthesiologist your surgery may need to be rescheduled.  Do not eat food after midnight the night before surgery.  No gum chewing or hard candies.  You may however, drink CLEAR liquids up to 2 hours before you are scheduled to arrive for your surgery. Do not drink anything within 2 hours of your scheduled arrival time.  Clear liquids include: - water  - apple juice without pulp - gatorade (not RED colors) - black coffee or tea (Do NOT add milk or creamers to the coffee or tea) Do NOT drink anything that is not on this list.  In addition, your doctor has ordered for you to drink the provided:  Ensure Pre-Surgery Clear Carbohydrate Drink  Drinking this carbohydrate drink up to two hours before surgery helps to reduce insulin resistance and improve patient outcomes. Please complete drinking 2 hours before scheduled arrival time.  One week prior to surgery:Last dose will be on 01-06-24 Stop Anti-inflammatories (NSAIDS) such as Advil, Aleve, Ibuprofen, Motrin, Naproxen, Naprosyn and Aspirin based products such as Excedrin, Goody's Powder, BC Powder. Stop ANY OVER THE COUNTER supplements until after surgery.  You may however, continue to take Tylenol if needed for pain up until the day of surgery.  Continue taking all of your other prescription medications up until the day of surgery.  ON THE DAY OF SURGERY ONLY TAKE THESE MEDICATIONS WITH SIPS OF  WATER: -cetirizine (ZYRTEC)  -levothyroxine (SYNTHROID)  -metoprolol tartrate (LOPRESSOR)   No Alcohol for 24 hours before or after surgery.  No Smoking including e-cigarettes for 24 hours before surgery.  No chewable tobacco products for at least 6 hours before surgery.  No nicotine patches on the day of surgery.  Do not use any "recreational" drugs for at least a week (preferably 2 weeks) before your surgery.  Please be advised that the combination of cocaine and anesthesia may have negative outcomes, up to and including death. If you test positive for cocaine, your surgery will be cancelled.  On the morning of surgery brush your teeth with toothpaste and water, you may rinse your mouth with mouthwash if you wish. Do not swallow any toothpaste or mouthwash.  Use CHG Soap as directed on instruction sheet.  Bring your C-Pap machine to the hospital  Do not wear jewelry, make-up, hairpins, clips or nail polish.  For welded (permanent) jewelry: bracelets, anklets, waist bands, etc.  Please have this removed prior to surgery.  If it is not removed, there is a chance that hospital personnel will need to cut it off on the day of surgery.  Do not wear lotions, powders, or perfumes.   Do not shave body hair from the neck down 48 hours before surgery.  Contact lenses, hearing aids and dentures may not be worn into surgery.  Do not bring valuables to the hospital. [facility] is not responsible for any missing/lost belongings or valuables.   Notify your doctor if there is any change in your  medical condition (cold, fever, infection).  Wear comfortable clothing (specific to your surgery type) to the hospital.  After surgery, you can help prevent lung complications by doing breathing exercises.  Take deep breaths and cough every 1-2 hours. Your doctor may order a device called an Incentive Spirometer to help you take deep breaths. When coughing or sneezing, hold a pillow firmly against  your incision with both hands. This is called "splinting." Doing this helps protect your incision. It also decreases belly discomfort.  If you are being admitted to the hospital overnight, leave your suitcase in the car. After surgery it may be brought to your room.  In case of increased patient census, it may be necessary for you, the patient, to continue your postoperative care in the Same Day Surgery department.  If you are being discharged the day of surgery, you will not be allowed to drive home. You will need a responsible individual to drive you home and stay with you for 24 hours after surgery.   If you are taking public transportation, you will need to have a responsible individual with you.  Please call the Pre-admissions Testing Dept. at [phone] if you have any questions about these instructions.  Surgery Visitation Policy:  Patients having surgery or a procedure may have two visitors.  Children under the age of 4 must have an adult with them who is not the patient.  Inpatient Visitation:    Visiting hours are 7 a.m. to 8 p.m. Up to four visitors are allowed at one time in a patient room. The visitors may rotate out with other people during the day.  One visitor age 77 or older may stay with the patient overnight and must be in the room by 8 p.m    Pre-operative 5 CHG Bath Instructions   You can play a key role in reducing the risk of infection after surgery. Your skin needs to be as free of germs as possible. You can reduce the number of germs on your skin by washing with CHG (chlorhexidine gluconate) soap before surgery. CHG is an antiseptic soap that kills germs and continues to kill germs even after washing.   DO NOT use if you have an allergy to chlorhexidine/CHG or antibacterial soaps. If your skin becomes reddened or irritated, stop using the CHG and notify one of our RNs at [phone].   Please shower with the CHG soap starting 4 days before surgery using  the following schedule:     Please keep in mind the following:  DO NOT shave, including legs and underarms, starting the day of your first shower.   You may shave your face at any point before/day of surgery.  Place clean sheets on your bed the day you start using CHG soap. Use a clean washcloth (not used since being washed) for each shower. DO NOT sleep with pets once you start using the CHG.   CHG Shower Instructions:  If you choose to wash your hair and private area, wash first with your normal shampoo/soap.  After you use shampoo/soap, rinse your hair and body thoroughly to remove shampoo/soap residue.  Turn the water OFF and apply about 3 tablespoons (45 ml) of CHG soap to a CLEAN washcloth.  Apply CHG soap ONLY FROM YOUR NECK DOWN TO YOUR TOES (washing for 3-5 minutes)  DO NOT use CHG soap on face, private areas, open wounds, or sores.  Pay special attention to the area where your surgery is being performed.  If  you are having back surgery, having someone wash your back for you may be helpful. Wait 2 minutes after CHG soap is applied, then you may rinse off the CHG soap.  Pat dry with a clean towel  Put on clean clothes/pajamas   If you choose to wear lotion, please use ONLY the CHG-compatible lotions on the back of this paper.     Additional instructions for the day of surgery: DO NOT APPLY any lotions, deodorants, cologne, or perfumes.   Put on clean/comfortable clothes.  Brush your teeth.  Ask your nurse before applying any prescription medications to the skin.      CHG Compatible Lotions   Aveeno Moisturizing lotion  Cetaphil Moisturizing Cream  Cetaphil Moisturizing Lotion  Clairol Herbal Essence Moisturizing Lotion, Dry Skin  Clairol Herbal Essence Moisturizing Lotion, Extra Dry Skin  Clairol Herbal Essence Moisturizing Lotion, Normal Skin  Curel Age Defying Therapeutic Moisturizing Lotion with Alpha Hydroxy  Curel Extreme Care Body Lotion  Curel Soothing Hands  Moisturizing Hand Lotion  Curel Therapeutic Moisturizing Cream, Fragrance-Free  Curel Therapeutic Moisturizing Lotion, Fragrance-Free  Curel Therapeutic Moisturizing Lotion, Original Formula  Eucerin Daily Replenishing Lotion  Eucerin Dry Skin Therapy Plus Alpha Hydroxy Crme  Eucerin Dry Skin Therapy Plus Alpha Hydroxy Lotion  Eucerin Original Crme  Eucerin Original Lotion  Eucerin Plus Crme Eucerin Plus Lotion  Eucerin TriLipid Replenishing Lotion  Keri Anti-Bacterial Hand Lotion  Keri Deep Conditioning Original Lotion Dry Skin Formula Softly Scented  Keri Deep Conditioning Original Lotion, Fragrance Free Sensitive Skin Formula  Keri Lotion Fast Absorbing Fragrance Free Sensitive Skin Formula  Keri Lotion Fast Absorbing Softly Scented Dry Skin Formula  Keri Original Lotion  Keri Skin Renewal Lotion Keri Silky Smooth Lotion  Keri Silky Smooth Sensitive Skin Lotion  Nivea Body Creamy Conditioning Oil  Nivea Body Extra Enriched Lotion  Nivea Body Original Lotion  Nivea Body Sheer Moisturizing Lotion Nivea Crme  Nivea Skin Firming Lotion  NutraDerm 30 Skin Lotion  NutraDerm Skin Lotion  NutraDerm Therapeutic Skin Cream  NutraDerm Therapeutic Skin Lotion  ProShield Protective Hand Cream  Provon moisturizing lotion  How to Use an Incentive Spirometer An incentive spirometer is a tool that measures how well you are filling your lungs with each breath. Learning to take long, deep breaths using this tool can help you keep your lungs clear and active. This may help to reverse or lessen your chance of developing breathing (pulmonary) problems, especially infection. You may be asked to use a spirometer: After a surgery. If you have a lung problem or a history of smoking. After a long period of time when you have been unable to move or be active. If the spirometer includes an indicator to show the highest number that you have reached, your health care provider or respiratory therapist  will help you set a goal. Keep a log of your progress as told by your health care provider. What are the risks? Breathing too quickly may cause dizziness or cause you to pass out. Take your time so you do not get dizzy or light-headed. If you are in pain, you may need to take pain medicine before doing incentive spirometry. It is harder to take a deep breath if you are having pain. How to use your incentive spirometer  Sit up on the edge of your bed or on a chair. Hold the incentive spirometer so that it is in an upright position. Before you use the spirometer, breathe out normally. Place the mouthpiece in  your mouth. Make sure your lips are closed tightly around it. Breathe in slowly and as deeply as you can through your mouth, causing the piston or the ball to rise toward the top of the chamber. Hold your breath for 3-5 seconds, or for as long as possible. If the spirometer includes a coach indicator, use this to guide you in breathing. Slow down your breathing if the indicator goes above the marked areas. Remove the mouthpiece from your mouth and breathe out normally. The piston or ball will return to the bottom of the chamber. Rest for a few seconds, then repeat the steps 10 or more times. Take your time and take a few normal breaths between deep breaths so that you do not get dizzy or light-headed. Do this every 1-2 hours when you are awake. If the spirometer includes a goal marker to show the highest number you have reached (best effort), use this as a goal to work toward during each repetition. After each set of 10 deep breaths, cough a few times. This will help to make sure that your lungs are clear. If you have an incision on your chest or abdomen from surgery, place a pillow or a rolled-up towel firmly against the incision when you cough. This can help to reduce pain while taking deep breaths and coughing. General tips When you are able to get out of bed: Walk around often. Continue  to take deep breaths and cough in order to clear your lungs. Keep using the incentive spirometer until your health care provider says it is okay to stop using it. If you have been in the hospital, you may be told to keep using the spirometer at home. Contact a health care provider if: You are having difficulty using the spirometer. You have trouble using the spirometer as often as instructed. Your pain medicine is not giving enough relief for you to use the spirometer as told. You have a fever. Get help right away if: You develop shortness of breath. You develop a cough with bloody mucus from the lungs. You have fluid or blood coming from an incision site after you cough. Summary An incentive spirometer is a tool that can help you learn to take long, deep breaths to keep your lungs clear and active. You may be asked to use a spirometer after a surgery, if you have a lung problem or a history of smoking, or if you have been inactive for a long period of time. Use your incentive spirometer as instructed every 1-2 hours while you are awake. If you have an incision on your chest or abdomen, place a pillow or a rolled-up towel firmly against your incision when you cough. This will help to reduce pain. Get help right away if you have shortness of breath, you cough up bloody mucus, or blood comes from your incision when you cough. This information is not intended to replace advice given to you by your health care provider. Make sure you discuss any questions you have with your health care provider.    Preoperative Educational Videos for Total Hip, Knee and Shoulder Replacements  To better prepare for surgery, please view our videos that explain the physical activity and discharge planning required to have the best surgical recovery at [facility].  [email or web address]  Questions? Call [phone] or email  [email or web address]

## 2024-01-01 NOTE — Progress Notes (Signed)
 [facility] [address] [place], [place] [address]  Internal MEDICINE  Office Visit Note  Patient Name: Claudia Martin  [id number]  [id number]  Date of Service: 01/13/2024  Chief Complaint  Patient presents with   Acute Visit   Rash    Mammogram done a couple of weeks ago, has had rash since then.     HPI Pt is here for a sick visit. -Had a mammogram a few weeks ago and started itching under right breast that day then started on belly, groin and now left arm as well. Under breast and along groin appears consistent with candidiasis. Arm appearance more consistent with excoriation marks at this time without any other rash/skin eruption present -Has tried soap and water, hydrocortisone, aloe vera gel. These help some -she is supposed to have knee replacement soon, so they advised she be seen to address itching/rash prior -pt has side effect to prednisone so will avoid this  Current Medication:  Outpatient Encounter Medications as of 01/01/2024  Medication Sig   acetaminophen (TYLENOL) 500 MG tablet Take 2 tablets (1,000 mg total) by mouth every 8 (eight) hours. (Patient taking differently: Take 1,000 mg by mouth every 6 (six) hours as needed for moderate pain (pain score 4-6).)   aspirin 81 MG chewable tablet Chew 1 tablet (81 mg total) by mouth daily.   cetirizine (ZYRTEC) 10 MG tablet Take 10 mg by mouth every morning.   chlorhexidine (PERIDEX) 0.12 % solution USE 5 ML AS DIRECTED IN THE MOUTH OR THROAT TWICE DAILY   EPINEPHrine 0.3 mg/0.3 mL IJ SOAJ injection Inject 0.3 mg into the muscle as needed for anaphylaxis.   hydrOXYzine (VISTARIL) 25 MG capsule Take 1 capsule (25 mg total) by mouth every 8 (eight) hours as needed.   levothyroxine (SYNTHROID) 50 MCG tablet TAKE 1 TABLET(50 MCG) BY MOUTH DAILY BEFORE BREAKFAST   losartan (COZAAR) 25 MG tablet Take 1 tablet (25 mg total) by mouth 2 (two) times daily.   metoprolol tartrate (LOPRESSOR) 25 MG tablet Take  1 tablet (25 mg total) by mouth 2 (two) times daily.   miconazole (MONISTAT 7) 2 % vaginal cream Place 1 Applicatorful vaginally daily as needed (irritation).   nitroGLYCERIN (NITROSTAT) 0.4 MG SL tablet Place 1 tablet (0.4 mg total) under the tongue every 5 (five) minutes x 3 doses as needed for chest pain.   nystatin-triamcinolone ointment (MYCOLOG) Apply 1 Application topically 2 (two) times daily.   rosuvastatin (CRESTOR) 5 MG tablet Take 1 tablet (5 mg total) by mouth daily. (Patient taking differently: Take 5 mg by mouth every evening.)   valACYclovir (VALTREX) 1000 MG tablet TAKE 1 TABLET BY MOUTH DAILY. CAN TAKE 1 TABLET BY MOUTH TWICE DAILY IF FLARE UP FOR 7 DAYS   No facility-administered encounter medications on file as of 01/01/2024.      Medical History: Past Medical History:  Diagnosis Date   Allergic rhinitis    Aortic atherosclerosis (HCC)    Arthritis    Bifascicular block (RBBB + LAFB)    CAD (coronary artery disease) 06/06/2021   a.) STEMI --> LHC: EF 55-65%, 100% RI --> PCI performed placing a 2.5 x 18 mm Onyx Frontier DES   Cellulitis and abscess    Diastolic dysfunction 06/07/2021   a.) TTE 06/07/2021: EF 55-60%, no RWMAs, triv MR, mild AoV sclerosis with no stenosis, G1DD; b.) TTE 07/17/2021: EF >55%, mild LVH, no RWMAs; triv TR, mild MR, G1DD   Diverticulosis    Enlarged heart  Eyelid inflammation    Frequent nosebleeds    GERD (gastroesophageal reflux disease)    Hematuria syndrome    Hemorrhoids    Herpes genitalis    Herpes simplex    History of bilateral cataract extraction    History of colonic polyps    History of methicillin resistant staphylococcus aureus (MRSA) 2021   HTN (hypertension)    Hydronephrosis    Hyperlipidemia    Hypothyroidism    Incomplete bladder emptying    Internal hordeolum    Long term current use of antithrombotics/antiplatelets    a.) on daily DAPT therapy (ASA + ticagrelor) x 1 year s/p STEMI; ticagrelor has been  discontinued   OSA on CPAP    PONV (postoperative nausea and vomiting)    Primary ovarian failure    Sciatica    Shortness of breath    Sinus bradycardia    STEMI (ST elevation myocardial infarction) (HCC) 06/06/2021   a.) LHC/PCI 06/06/2021 --> 100% RI (2.5 x 18 mm Onyx Frontier DES)   Urethral diverticulum    Vaginal atrophy    Vitamin B12 deficiency    Vitamin D deficiency      Vital Signs: BP (!) 168/97   Pulse 88   Temp 98.5 F (36.9 C)   Resp 16   Ht 4\' 11"  (1.499 m)   Wt 165 lb (74.8 kg)   SpO2 98%   BMI 33.33 kg/m    Review of Systems  Constitutional:  Negative for fatigue and fever.  HENT:  Negative for congestion, mouth sores and postnasal drip.   Respiratory:  Negative for cough.   Cardiovascular:  Negative for chest pain.  Genitourinary:  Negative for flank pain.  Musculoskeletal:  Positive for arthralgias and gait problem.  Skin:  Positive for rash.  Psychiatric/Behavioral: Negative.      Physical Exam Vitals and nursing note reviewed.  Constitutional:      General: She is not in acute distress.    Appearance: Normal appearance. She is obese. She is not ill-appearing.  HENT:     Head: Normocephalic and atraumatic.  Cardiovascular:     Rate and Rhythm: Normal rate and regular rhythm.  Pulmonary:     Effort: Pulmonary effort is normal. No respiratory distress.  Skin:    Findings: Rash present.  Neurological:     Mental Status: She is alert.     Gait: Gait abnormal.  Psychiatric:        Mood and Affect: Mood normal.        Behavior: Behavior normal.       Assessment/Plan: 1. Itching (Primary) May use hydroxyzine as needed, advised on possible S/E including grogginess. Also given cream to help rash - hydrOXYzine (VISTARIL) 25 MG capsule; Take 1 capsule (25 mg total) by mouth every 8 (eight) hours as needed.  Dispense: 30 capsule; Refill: 0  2. Rash and other nonspecific skin eruption Rash under breast and along groin consistent with  candidiasis, will start mycolog topical as needed. Advised to call if not improving. Will also prescribed hydroxyzine prn for itching.    General Counseling: Claudia Martin understanding of the findings of todays visit and agrees with plan of treatment. I have discussed any further diagnostic evaluation that may be needed or ordered today. We also reviewed her medications today. she has been encouraged to call the office with any questions or concerns that should arise related to todays visit.    Counseling:    No orders of the defined types were  placed in this encounter.   Meds ordered this encounter  Medications   nystatin-triamcinolone ointment (MYCOLOG)    Sig: Apply 1 Application topically 2 (two) times daily.    Dispense:  30 g    Refill:  0   hydrOXYzine (VISTARIL) 25 MG capsule    Sig: Take 1 capsule (25 mg total) by mouth every 8 (eight) hours as needed.    Dispense:  30 capsule    Refill:  0    Time spent:25 Minutes

## 2024-01-03 LAB — URINE CULTURE: Culture: >=100000 — AB

## 2024-01-04 ENCOUNTER — Encounter
Admission: RE | Admit: 2024-01-04 | Discharge: 2024-01-04 | Disposition: A | Discharge status: Home / Self Care | Source: Ambulatory Visit | Attending: Orthopedic Surgery | Admitting: Orthopedic Surgery

## 2024-01-04 ENCOUNTER — Telehealth: Payer: Self-pay | Admitting: Urgent Care

## 2024-01-04 DIAGNOSIS — Z01812 Encounter for preprocedural laboratory examination: Secondary | ICD-10-CM | POA: Insufficient documentation

## 2024-01-04 DIAGNOSIS — N390 Urinary tract infection, site not specified: Secondary | ICD-10-CM

## 2024-01-04 DIAGNOSIS — Z01818 Encounter for other preprocedural examination: Secondary | ICD-10-CM

## 2024-01-04 LAB — SURGICAL PCR SCREEN
MRSA, PCR: NEGATIVE
Staphylococcus aureus: NEGATIVE

## 2024-01-04 MED ORDER — CEPHALEXIN 500 MG PO CAPS: 500.0000 mg | ORAL_CAPSULE | Freq: Two times a day (BID) | ORAL | 0 refills | Status: AC

## 2024-01-04 NOTE — Progress Notes (Signed)
 [facility] Perioperative Services: Pre-Admission/Anesthesia Testing  Abnormal Lab Notification and Treatment Plan of Care   Date: 01/04/24  Name: Claudia Martin MRN:   [id number]  Re: Abnormal labs noted during PAT appointment   Notified:  Provider Name Provider Role Notification Mode  Claudia Berber, MD Orthopedics (Surgeon) Routed and/or faxed via [facility]   Abnormal Lab Value(s):   Lab Results  Component Value Date   COLORURINE YELLOW (A) 01/01/2024   APPEARANCEUR HAZY (A) 01/01/2024   LABSPEC 1.025 01/01/2024   PHURINE 5.0 01/01/2024   GLUCOSEU NEGATIVE 01/01/2024   HGBUR NEGATIVE 01/01/2024   BILIRUBINUR NEGATIVE 01/01/2024   KETONESUR NEGATIVE 01/01/2024   PROTEINUR NEGATIVE 01/01/2024   UROBILINOGEN 0.2 02/27/2021   NITRITE NEGATIVE 01/01/2024   LEUKOCYTESUR SMALL (A) 01/01/2024   EPIU 0-5 01/01/2024   WBCU 21-50 01/01/2024   RBCU 0-5 01/01/2024   BACTERIA MANY (A) 01/01/2024   CULT >=100,000 COLONIES/mL ESCHERICHIA COLI (A) 01/01/2024    Clinical Information and Notes:  Patient is scheduled for RIGHT TOTAL KNEE ARTHROPLASTY on 01/14/2024.    UA performed in PAT consistent with/concerning for infection.  No leukocytosis noted on CBC; WBC 5.3 Renal function: Estimated Creatinine Clearance: 51.9 mL/min (by C-G formula based on SCr of 0.69 mg/dL). Urine C&S added to assess for pathogenically significant growth.  Impression and Plan:  Claudia Martin with a UA that was (+) for infection; reflex culture sent.  Culture grew out a pathogenic with significant Escherichia  colony count.  Contacted patient to discuss. Patient reporting that she is experiencing suprapubic pressure and urinary frequency/urgency. No significant dysuria. She denies nausea, vomiting, fevers, chills, back patient, and any signs of frank hematuria. Patient with surgery scheduled soon. In efforts to avoid delaying patient's procedure, or have her experience  any potentially significant perioperative complications related to the aforementioned, I would like to proceed with empiric treatment for urinary tract infection.  Allergies reviewed. Culture report also reviewed to ensure culture appropriate coverage is being provided. Will treat with a 5 day course of CEPHALEXIN. Patient encouraged to complete the entire course of antibiotics even if she begins to feel better. She was advised that if culture demonstrates resistance to the prescribed antibiotic, she will be contacted and advised of the need to change the antibiotic being used to treat her infection.   Meds ordered this encounter  Medications   cephALEXin (KEFLEX) 500 MG capsule    Sig: Take 1 capsule (500 mg total) by mouth 2 (two) times daily for 5 days. Increase WATER intake while taking this medication.    Dispense:  10 capsule    Refill:  0    Please contact the patient as soon as it is available for pickup. Rx is for preoperative UTI treatment and needs to be started ASAP.   Patient encouraged to increase her fluid intake as much as possible. Discussed that water is always best to flush the urinary tract. She was advised to avoid caffeine containing fluids until her infections clears, as caffeine can cause her to experience painful bladder spasms.   May use Tylenol as needed for pain/fever should she experience these symptoms.   Patient instructed to call surgeon's office or PAT with any questions or concerns related to the above outlined course of treatment. Additionally, she was instructed to call if she feels like she is getting worse overall while on treatment. Results and treatment plan of care forwarded to primary attending surgeon to make them aware.   Encounter  Diagnoses  Name Primary?   Pre-operative laboratory examination Yes   E. coli UTI (urinary tract infection)    Claudia Mulling, MSN, APRN, FNP-C, CEN [facility]  Perioperative Services Nurse  Practitioner Phone: [phone] Fax: [phone] 01/04/24 9:37 AM  NOTE: This note has been prepared using Dragon dictation software. Despite my best ability to proofread, there is always the potential that unintentional transcriptional errors may still occur from this process.

## 2024-01-05 ENCOUNTER — Other Ambulatory Visit

## 2024-01-06 ENCOUNTER — Encounter: Payer: Self-pay | Admitting: Orthopedic Surgery

## 2024-01-06 ENCOUNTER — Ambulatory Visit: Payer: 59

## 2024-01-06 NOTE — Progress Notes (Signed)
 Perioperative Services  Pre-Admission/Anesthesia Testing Clinical Review  Date: 01/06/24  Patient Demographics:  Name: Claudia Martin DOB:   1946-01-27 MRN:   [id number]  Planned Surgical Procedure(s):    Case: [id number] Date/Time: 01/14/24 1010   Procedure: ARTHROPLASTY, KNEE, TOTAL (Right: Knee)   Anesthesia type: Choice   Diagnosis:      Primary osteoarthritis of right knee [M17.11]     Right knee pain, unspecified chronicity [M25.561]   Pre-op diagnosis:      Primary osteoarthritis of right knee M17.11     Right knee pain, unspecified chronicity M25.561   Location: [facility] OR ROOM 01 / [facility]   Surgeons: Reinaldo Berber, MD      NOTE: Available PAT nursing documentation and vital signs have been reviewed. Clinical nursing staff has updated patient's PMH/PSHx, current medication list, and drug allergies/intolerances to ensure comprehensive history available to assist in medical decision making as it pertains to the aforementioned surgical procedure and anticipated anesthetic course. Extensive review of available clinical information performed. [employer] PMH and PSHx updated with any diagnoses/procedures that  may have been inadvertently omitted during her intake with the pre-admission testing department's nursing staff.  Clinical Discussion:  Claudia Martin is a 78 y.o. female who is submitted for pre-surgical anesthesia review and clearance prior to her undergoing the above procedure. Patient has never been a smoker. Pertinent PMH includes: CAD, STEMI, diastolic dysfunction, cardiomegaly, aortic atherosclerosis, HTN, HLD, hypothyroidism, dyspnea, OSAH (requires nocturnal PAP therapy), GERD (no daily Tx), ventral hernia, OA.  Patient is followed by cardiology Juliann Pares, MD). She was last seen in the cardiology clinic on 07/20/2023; notes reviewed. At the time of her clinic visit, patient doing well overall from a cardiovascular perspective.  Patient denied any chest pain, shortness of breath, PND, orthopnea, palpitations, significant peripheral edema, weakness, fatigue, vertiginous symptoms, or presyncope/syncope. Patient with a past medical history significant for cardiovascular diagnoses. Documented physical exam was grossly benign, providing no evidence of acute exacerbation and/or decompensation of the patient's known cardiovascular conditions.  Patient suffered a STEMI on 06/06/2021.  Diagnostic left heart catheterization at that time revealed a normal left ventricular systolic function with an EF of 55 to 65%.  There was 100% occlusion of the ramus intermedius.  PCI subsequently performed placing a 2.5 x 18 mm Onyx Frontier DES yielding excellent angiographic result and TIMI-3 flow.  Most recent TTE performed on 07/17/2021  revealed a normal left ventricular systolic function with an EF of 55-60%. There were no regional wall motion abnormalities. Left ventricular diastolic Doppler parameters consistent with abnormal relaxation (G1DD). Right ventricular size and function normal. There was trivial tricuspid and mild mitral valve regurgitation. All transvalvular gradients were noted to be normal providing no evidence suggestive of valvular stenosis. Aorta normal in size with no evidence of ectasia or aneurysmal dilatation. Small pericardial effusion noted.   Following STEMI with stent placement, patient was treated with daily DAPT therapy (ASA + ticagrelor) x 1 year. Ticagrelor has since been discontinued and patient remains on a daily low dose ASA. Blood pressure mildly elevated at 140/82 mmHg on currently prescribed ARB (losartan) and beta-blocker (metoprolol tartrate) therapies.  Patient is on rosuvastatin for her HLD diagnosis and further ASCVD prevention.  She has a supply of short acting nitrates (NTG) to use on a as needed basis; denied recent use. Patient is not diabetic. She does have an OSAH diagnosis and is reported to be compliant  with prescribed nocturnal PAP therapy.  Functional  capacity limited by patient's orthopedic pain.  With that said, patient able to complete all her for her ADL/IADLs without cardiovascular limitation.  Per the DASI, patient felt to be able to achieve at least 4 METS of physical activity without experiencing any significant degree of angina/anginal equivalent symptoms.  No changes were made to his medication regimen.  Patient to follow-up with outpatient cardiology and 6 months or sooner if needed.  Claudia Martin is scheduled for an elective ARTHROPLASTY, KNEE, TOTAL (Right: Knee) on 01/14/2024 with Dr. Reinaldo Berber, MD. Given patient's past medical history significant for cardiovascular diagnoses, presurgical cardiac clearance was sought by the performing surgeon's office and PAT team. Per cardiology, "this patient is optimized for surgery and may proceed with the planned procedural course with a LOW risk of significant perioperative cardiovascular complications".  In review of her medical reconciliation, it is noted that patient is on daily antithrombotic therapy. Given that patient's past medical history is significant for cardiovascular diagnoses, including but not limited to CAD, orthopedics has cleared patient to continue her daily low dose ASA throughout her perioperative course. She will be asked to hold her normal dose on the day of her procedure only. Patient has been updated on these directives from her specialty care providers by the PAT team.  Patient reports previous perioperative complications with anesthesia in the past. Patient has a PMH (+) for PONV. Symptoms and history of PONV will be discussed with patient by anesthesia team on the day of her procedure. Interventions will be ordered as deemed necessary based on patient's individual care needs as determined by anesthesiologist. In review of the available records, it is noted that patient underwent a neuraxial anesthetic course  here at [facility] (ASA III) in 01/2023 without documented complications.      01/01/2024   11:19 AM 01/01/2024    9:29 AM 12/14/2023   10:59 AM  Vitals with BMI  Height 4\' 11"  4\' 11"  4\' 11"   Weight 165 lbs 169 lbs 12 oz 170 lbs  BMI 33.31 34.27 34.32  Systolic 168 141 811  Diastolic 97 64 67  Pulse 88 75 66    Providers/Specialists:   NOTE: Primary physician provider listed below. Patient may have been seen by APP or partner within same practice.   PROVIDER ROLE / SPECIALTY LAST Wilber Bihari, MD Orthopedics (Surgeon) 12/30/2023  Sallyanne Kuster, NP Primary Care Provider 01/01/2024  Rudean Hitt, MD Cardiology 07/20/2023  Freda Munro, MD Pulmonary/Sleep Medicine 12/14/2023   Allergies:  Augmentin [amoxicillin-pot clavulanate], Bactrim [sulfamethoxazole-trimethoprim], Doxycycline, Ibuprofen, Iodinated contrast media, Macrobid [nitrofurantoin], Prednisone, Sulfa antibiotics, and Lavender oil  Current Home Medications:   No current facility-administered medications for this encounter.    acetaminophen (TYLENOL) 500 MG tablet   aspirin 81 MG chewable tablet   cetirizine (ZYRTEC) 10 MG tablet   chlorhexidine (PERIDEX) 0.12 % solution   EPINEPHrine 0.3 mg/0.3 mL IJ SOAJ injection   levothyroxine (SYNTHROID) 50 MCG tablet   losartan (COZAAR) 25 MG tablet   metoprolol tartrate (LOPRESSOR) 25 MG tablet   miconazole (MONISTAT 7) 2 % vaginal cream   nitroGLYCERIN (NITROSTAT) 0.4 MG SL tablet   rosuvastatin (CRESTOR) 5 MG tablet   valACYclovir (VALTREX) 1000 MG tablet   cephALEXin (KEFLEX) 500 MG capsule   hydrOXYzine (VISTARIL) 25 MG capsule   nystatin-triamcinolone ointment (MYCOLOG)   History:   Past Medical History:  Diagnosis Date   Allergic rhinitis    Aortic atherosclerosis (HCC)    Arthritis  Bifascicular block (RBBB + LAFB)    CAD (coronary artery disease) 06/06/2021   a.) STEMI --> LHC: EF 55-65%, 100% RI --> PCI  performed placing a 2.5 x 18 mm Onyx Frontier DES   Cellulitis and abscess    Diastolic dysfunction 06/07/2021   a.) TTE 06/07/2021: EF 55-60%, no RWMAs, triv MR, mild AoV sclerosis with no stenosis, G1DD; b.) TTE 07/17/2021: EF >55%, mild LVH, no RWMAs; triv TR, mild MR, G1DD   Diverticulosis    Enlarged heart    Eyelid inflammation    Frequent nosebleeds    GERD (gastroesophageal reflux disease)    Hematuria syndrome    Hemorrhoids    Herpes genitalis    Herpes simplex    History of colonic polyps    History of methicillin resistant staphylococcus aureus (MRSA) 2021   HTN (hypertension)    Hydronephrosis    Hyperlipidemia    Hypothyroidism    Incomplete bladder emptying    Internal hordeolum    Long term current use of antithrombotics/antiplatelets    a.) on daily DAPT therapy (ASA + ticagrelor) x 1 year s/p STEMI; ticagrelor has been discontinued   OSA on CPAP    PONV (postoperative nausea and vomiting)    nausea only   Primary ovarian failure    Sciatica    Shortness of breath    Sinus bradycardia    STEMI (ST elevation myocardial infarction) (HCC) 06/06/2021   a.) LHC/PCI 06/06/2021 --> 100% RI (2.5 x 18 mm Onyx Frontier DES)   Urethral diverticulum    Vaginal atrophy    Vitamin B12 deficiency    Vitamin D deficiency    Past Surgical History:  Procedure Laterality Date   ABDOMINAL WALL DEFECT REPAIR N/A 05/27/2022   Procedure: REPAIR ABDOMINAL WALL, reconstruction;  Surgeon: Leafy Ro, MD;  Location: [facility] ORS;  Service: General;  Laterality: N/A;   BREAST BIOPSY Bilateral    benign   CATARACT EXTRACTION W/PHACO Right 06/05/2021   Procedure: CATARACT EXTRACTION PHACO AND INTRAOCULAR LENS PLACEMENT (IOC) RIGHT;  Surgeon: Lockie Mola, MD;  Location: [facility];  Service: Ophthalmology;  Laterality: Right;  3.02 00:39.5   CATARACT EXTRACTION W/PHACO Left 12/04/2021   Procedure: CATARACT EXTRACTION PHACO AND INTRAOCULAR LENS PLACEMENT (IOC) LEFT;   Surgeon: Lockie Mola, MD;  Location: [facility];  Service: Ophthalmology;  Laterality: Left;  6.23 00:48.0   COLON SURGERY     Diverticulitis   COLONOSCOPY WITH PROPOFOL N/A 03/15/2018   Procedure: COLONOSCOPY WITH PROPOFOL;  Surgeon: Toney Reil, MD;  Location: [facility] END[facility]COPY;  Service: Gastroenterology;  Laterality: N/A;   COLONOSCOPY WITH PROPOFOL N/A 01/09/2021   Procedure: COLONOSCOPY WITH PROPOFOL;  Surgeon: Toney Reil, MD;  Location: [facility] ENDOSCOPY;  Service: Gastroenterology;  Laterality: N/A;   CORONARY/GRAFT ACUTE MI REVASCULARIZATION N/A 06/06/2021   Procedure: Coronary/Graft Acute MI Revascularization;  Surgeon: Alwyn Pea, MD;  Location: [facility] INVASIVE CV LAB;  Service: Cardiovascular;  Laterality: N/A;   ECTOPIC PREGNANCY SURGERY     fx thumb     HERNIA REPAIR     INSERTION OF MESH N/A 05/27/2022   Procedure: INSERTION OF MESH;  Surgeon: Leafy Ro, MD;  Location: [facility] ORS;  Service: General;  Laterality: N/A;   LEFT HEART CATH AND CORONARY ANGIOGRAPHY N/A 06/06/2021   Procedure: LEFT HEART CATH AND CORONARY ANGIOGRAPHY;  Surgeon: Alwyn Pea, MD;  Location: [facility] INVAS[facility] CV [facility];  Service: Cardiovascular;  Laterality: N/A;   TOTAL KNEE ARTHROPLASTY Left 01/26/2023  Procedure: TOTAL KNEE ARTHROPLASTY;  Surgeon: Reinaldo Berber, MD;  Location: [facility] ORS;  Service: Orthopedics;  Laterality: Left;   VENTRAL HERNIA REPAIR N/A 05/27/2022   Procedure: HERNIA REPAIR VENTRAL ADULT, recurrent;  Surgeon: Leafy Ro, MD;  Location: [facility] ORS;  Service: General;  Laterality: N/A;   Family History  Problem Relation Age of Onset   Tuberculosis Mother    Nephrolithiasis Brother    Vision loss Daughter    Hypertension Daughter    Social History   Tobacco Use   Smoking status: Never    Passive exposure: Never   Smokeless tobacco: Never  Vaping Use   Vaping status: Never Used  Substance Use Topics   Alcohol use: No   Drug use:  Never    Pertinent Clinical Results:  LABS: Labs reviewed: Acceptable for surgery. Lab Results  Component Value Date   WBC 5.3 01/01/2024   HGB 11.7 (L) 01/01/2024   HCT 34.2 (L) 01/01/2024   MCV 90.7 01/01/2024   PLT 216 01/01/2024   Lab Results  Component Value Date   NA 141 01/01/2024   CL 106 01/01/2024   K 3.3 (L) 01/01/2024   CO2 25 01/01/2024   BUN 25 (H) 01/01/2024   CREATININE 0.69 01/01/2024   GFRNONAA >60 01/01/2024   CALCIUM 9.2 01/01/2024   PHOS 2.3 (L) 05/31/2022   ALBUMIN 4.0 01/01/2024   GLUCOSE 93 01/01/2024   Hospital Outpatient Visit on 01/04/2024  Component Date Value Ref Range Status   MRSA, PCR 01/04/2024 NEGATIVE  NEGATIVE Final   Staphylococcus aureus 01/04/2024 NEGATIVE  NEGATIVE Final   Comment: (NOTE) The Xpert SA Assay (FDA approved for NASAL specimens in patients 63 years of age and older), is one component of a comprehensive surveillance program. It is not intended to diagnose infection nor to guide or monitor treatment. Performed at [facility], [address] [place], [place] [address]     ECG: Date: 01/01/2024 Time ECG obtained: 1013 AM Rate: 69 bpm Rhythm: normal sinus; bifascicular block (RBBB + LAFB) Axis (leads I and aVF): Normal Intervals: PR 158 ms. QRS 124 ms. QTc 480 ms. ST segment and T wave changes: No evidence of acute ST segment elevation or depression.  Evidence of an age undetermined lateral infarct present. Comparison: Similar to previous tracing obtained on 01/20/2023   IMAGING / PROCEDURES: DIAGNOSTIC RADIOGRAPHS OF RIGHT KNEE 4+ VIEWS performed on 11/20/2023 Severe degenerative changes with lateral joint space narrowing with bone-on-bone articulation and tricompartmental changes with osteophyte formation and sclerosis.   There is valgus deformity measuring approximately 15 degrees.  AP, sunrise, and flexed PA of the left knee also show status post total knee arthroplasty with components in  appropriate position with no evidence of periprosthetic fracture or loosening.   CT ABDOMEN PELVIS W CONTRAST performed on 04/28/2022 Broad-based midline upper abdominal ventral hernia containing a segment of transverse colon, with decreased inflammatory changes along the right lateral margin of the hernia sac. No fluid collection or abscess is identified. No incarceration or obstruction. 2 other midline ventral hernias containing portions of small bowel as above, unchanged since prior exam. No incarceration or obstruction. Aortic atherosclerosis Indeterminate partially calcified hypodense lesion within the pancreatic tail, compatible with benign etiology given stability since 2011.  TRANSTHORACIC ECHOCARDIOGRAM performed on 07/17/2021 Normal left ventricular systolic function with an EF of >55% Left ventricular diastolic Doppler parameters consistent with abnormal relaxation (G1DD). Normal right ventricular systolic function Trivial TR Mild MR Normal transvalvular gradients; no valvular stenosis Small  pericardial effusion  LEFT HEART CATHETERIZATION AND CORONARY ANGIOGRAPHY performed on 06/06/2021 Normal left ventricular systolic function with EF of 55 to 65% Mildly elevated LVEDP 1% lesion of the ramus intermedius Successful PCI placing a 2.5 x 18 mm Onyx Frontier DES yielding excellent angiographic result and TIMI-3 flow   Impression and Plan:  Claudia Martin has been referred for pre-anesthesia review and clearance prior to her undergoing the planned anesthetic and procedural courses. Available labs, pertinent testing, and imaging results were personally reviewed by me in preparation for upcoming operative/procedural course. [facility] medical record has been updated following extensive record review and patient interview with PAT staff.   Preoperative labs indicated the patient had a preoperative urinary tract infection.  Culture and sensitivity report grew out Escherichia  coli.  Allergies were reviewed.  Patient treated with a culture appropriate 5-day course of CEPHALEXIN 500 mg twice daily.  She was encouraged to increase her fluid intake while taking the prescribed antimicrobial therapy. Patient to contact PAT office with any questions or concerns while on the prescribed course of treatment.  This patient has been appropriately cleared by cardiology with an overall LOW risk of significant perioperative cardiovascular complications. Based on clinical review performed today (01/06/24), barring any significant acute changes in the patient's overall condition, it is anticipated that she will be able to proceed with the planned surgical intervention. Any acute changes in clinical condition may necessitate her procedure being postponed and/or cancelled. Patient will meet with anesthesia team (MD and/or CRNA) on the day of her procedure for preoperative evaluation/assessment. Questions regarding anesthetic course will be fielded at that time.   Pre-surgical instructions were reviewed with the patient during her PAT appointment, and questions were fielded to satisfaction by PAT clinical staff. She has been instructed on which medications that she will need to hold prior to surgery, as well as the ones that have been deemed safe/appropriate to take of the day of her procedure. As part of the general education provided by PAT, patient made aware both verbally and in writing, that she would need to abstain from the use of any illegal substances during her perioperative course. She was advised that failure to follow the provided instructions could necessitate case cancellation or result serious perioperative complications up to and including death. Patient encouraged to contact PAT and/or her surgeon's office to discuss any questions or concerns that may arise prior to surgery; verbalized understanding.   Quentin Mulling, MSN, APRN, FNP-C, CEN [facility]  Peri-operative  Services Nurse Practitioner Phone: [phone] Fax: [phone] 01/06/24 9:59 AM  NOTE: This note has been prepared using Dragon dictation software. Despite my best ability to proofread, there is always the potential that unintentional transcriptional errors may still occur from this process.

## 2024-01-13 MED ORDER — ORAL CARE MOUTH RINSE: 15.0000 mL | Freq: Once | OROMUCOSAL | Status: AC

## 2024-01-13 MED ORDER — LACTATED RINGERS IV SOLN: INTRAVENOUS | Status: DC

## 2024-01-13 MED ORDER — DEXAMETHASONE SODIUM PHOSPHATE 10 MG/ML IJ SOLN
8.0000 mg | Freq: Once | INTRAMUSCULAR | Status: AC
  Administered 2024-01-14: 10 mg via INTRAVENOUS

## 2024-01-13 MED ORDER — CEFAZOLIN SODIUM-DEXTROSE 2-4 GM/100ML-% IV SOLN
2.0000 g | INTRAVENOUS | Status: AC
  Administered 2024-01-14: 2 g via INTRAVENOUS

## 2024-01-13 MED ORDER — TRANEXAMIC ACID-NACL 1000-0.7 MG/100ML-% IV SOLN
1000.0000 mg | INTRAVENOUS | Status: AC
  Administered 2024-01-14 (×2): 1000 mg via INTRAVENOUS

## 2024-01-13 MED ORDER — CHLORHEXIDINE GLUCONATE 0.12 % MT SOLN
15.0000 mL | Freq: Once | OROMUCOSAL | Status: AC
Start: 2024-01-13 — End: 2024-01-14
  Administered 2024-01-14: 15 mL via OROMUCOSAL

## 2024-01-14 ENCOUNTER — Encounter: Payer: Self-pay | Admitting: Orthopedic Surgery

## 2024-01-14 ENCOUNTER — Ambulatory Visit
Admission: RE | Admit: 2024-01-14 | Discharge: 2024-01-15 | Disposition: A | Discharge status: Home Health | Attending: Orthopedic Surgery | Admitting: Orthopedic Surgery

## 2024-01-14 ENCOUNTER — Ambulatory Visit

## 2024-01-14 ENCOUNTER — Ambulatory Visit: Payer: Self-pay | Admitting: Urgent Care

## 2024-01-14 ENCOUNTER — Other Ambulatory Visit: Payer: Self-pay

## 2024-01-14 ENCOUNTER — Encounter
Admission: RE | Disposition: A | Discharge status: Home Health | Payer: Self-pay | Source: Home / Self Care | Attending: Orthopedic Surgery

## 2024-01-14 DIAGNOSIS — E039 Hypothyroidism, unspecified: Secondary | ICD-10-CM | POA: Insufficient documentation

## 2024-01-14 DIAGNOSIS — I2510 Atherosclerotic heart disease of native coronary artery without angina pectoris: Secondary | ICD-10-CM | POA: Diagnosis not present

## 2024-01-14 DIAGNOSIS — I340 Nonrheumatic mitral (valve) insufficiency: Secondary | ICD-10-CM | POA: Diagnosis not present

## 2024-01-14 DIAGNOSIS — I10 Essential (primary) hypertension: Secondary | ICD-10-CM | POA: Diagnosis not present

## 2024-01-14 DIAGNOSIS — Z79899 Other long term (current) drug therapy: Secondary | ICD-10-CM | POA: Insufficient documentation

## 2024-01-14 DIAGNOSIS — Z7902 Long term (current) use of antithrombotics/antiplatelets: Secondary | ICD-10-CM | POA: Diagnosis not present

## 2024-01-14 DIAGNOSIS — G4733 Obstructive sleep apnea (adult) (pediatric): Secondary | ICD-10-CM | POA: Diagnosis not present

## 2024-01-14 DIAGNOSIS — I252 Old myocardial infarction: Secondary | ICD-10-CM | POA: Diagnosis not present

## 2024-01-14 DIAGNOSIS — I452 Bifascicular block: Secondary | ICD-10-CM | POA: Diagnosis not present

## 2024-01-14 DIAGNOSIS — Z79890 Hormone replacement therapy (postmenopausal): Secondary | ICD-10-CM | POA: Diagnosis not present

## 2024-01-14 DIAGNOSIS — Z96651 Presence of right artificial knee joint: Secondary | ICD-10-CM

## 2024-01-14 DIAGNOSIS — Z955 Presence of coronary angioplasty implant and graft: Secondary | ICD-10-CM | POA: Insufficient documentation

## 2024-01-14 DIAGNOSIS — K219 Gastro-esophageal reflux disease without esophagitis: Secondary | ICD-10-CM | POA: Diagnosis not present

## 2024-01-14 DIAGNOSIS — M1711 Unilateral primary osteoarthritis, right knee: Secondary | ICD-10-CM | POA: Diagnosis present

## 2024-01-14 DIAGNOSIS — Z7982 Long term (current) use of aspirin: Secondary | ICD-10-CM | POA: Insufficient documentation

## 2024-01-14 DIAGNOSIS — Z79624 Long term (current) use of inhibitors of nucleotide synthesis: Secondary | ICD-10-CM | POA: Diagnosis not present

## 2024-01-14 DIAGNOSIS — M25561 Pain in right knee: Secondary | ICD-10-CM | POA: Diagnosis present

## 2024-01-14 HISTORY — PX: TOTAL KNEE ARTHROPLASTY: SHX125

## 2024-01-14 HISTORY — DX: Cataract extraction status, right eye: Z98.42

## 2024-01-14 HISTORY — DX: Cataract extraction status, left eye: Z98.41

## 2024-01-14 SURGERY — ARTHROPLASTY, KNEE, TOTAL
Anesthesia: Spinal | Site: Knee | Laterality: Right

## 2024-01-14 MED ORDER — HYDROCODONE-ACETAMINOPHEN 5-325 MG PO TABS
1.0000 | ORAL_TABLET | ORAL | Status: DC | PRN
  Administered 2024-01-14: 2 via ORAL
  Administered 2024-01-15 (×2): 1 via ORAL
  Filled 2024-01-14: qty 1
  Filled 2024-01-14 (×2): qty 2

## 2024-01-14 MED ORDER — TRANEXAMIC ACID-NACL 1000-0.7 MG/100ML-% IV SOLN
INTRAVENOUS | Status: AC
  Filled 2024-01-14: qty 100

## 2024-01-14 MED ORDER — SODIUM CHLORIDE 0.9 % IR SOLN
Status: DC | PRN
  Administered 2024-01-14: 3000 mL

## 2024-01-14 MED ORDER — KETOROLAC TROMETHAMINE 15 MG/ML IJ SOLN
INTRAMUSCULAR | Status: AC
Start: 2024-01-14 — End: ?
  Filled 2024-01-14: qty 1

## 2024-01-14 MED ORDER — ROSUVASTATIN CALCIUM 5 MG PO TABS
5.0000 mg | ORAL_TABLET | Freq: Every evening | ORAL | Status: DC
  Administered 2024-01-14: 5 mg via ORAL

## 2024-01-14 MED ORDER — METOPROLOL TARTRATE 25 MG PO TABS
ORAL_TABLET | ORAL | Status: AC
  Filled 2024-01-14: qty 1

## 2024-01-14 MED ORDER — BUPIVACAINE HCL (PF) 0.5 % IJ SOLN
INTRAMUSCULAR | Status: DC | PRN
  Administered 2024-01-14: 2.8 mL

## 2024-01-14 MED ORDER — HYDROCODONE-ACETAMINOPHEN 5-325 MG PO TABS
ORAL_TABLET | ORAL | Status: AC
  Filled 2024-01-14: qty 2

## 2024-01-14 MED ORDER — BUPIVACAINE LIPOSOME 1.3 % IJ SUSP
INTRAMUSCULAR | Status: AC
  Filled 2024-01-14: qty 20

## 2024-01-14 MED ORDER — FENTANYL CITRATE (PF) 100 MCG/2ML IJ SOLN: 25.0000 ug | INTRAMUSCULAR | Status: DC | PRN

## 2024-01-14 MED ORDER — DOCUSATE SODIUM 100 MG PO CAPS
ORAL_CAPSULE | ORAL | Status: AC
  Filled 2024-01-14: qty 1

## 2024-01-14 MED ORDER — PHENYLEPHRINE HCL-NACL 20-0.9 MG/250ML-% IV SOLN
INTRAVENOUS | Status: AC
  Filled 2024-01-14: qty 250

## 2024-01-14 MED ORDER — ONDANSETRON HCL 4 MG/2ML IJ SOLN
INTRAMUSCULAR | Status: DC | PRN
  Administered 2024-01-14: 4 mg via INTRAVENOUS

## 2024-01-14 MED ORDER — METOCLOPRAMIDE HCL 5 MG/ML IJ SOLN: 5.0000 mg | Freq: Three times a day (TID) | INTRAMUSCULAR | Status: DC | PRN

## 2024-01-14 MED ORDER — FENTANYL CITRATE (PF) 100 MCG/2ML IJ SOLN
INTRAMUSCULAR | Status: DC | PRN
  Administered 2024-01-14: 50 ug via INTRAVENOUS
  Administered 2024-01-14: 25 ug via INTRAVENOUS

## 2024-01-14 MED ORDER — LOSARTAN POTASSIUM 50 MG PO TABS
ORAL_TABLET | ORAL | Status: AC
Start: 2024-01-14 — End: ?
  Filled 2024-01-14: qty 1

## 2024-01-14 MED ORDER — LEVOTHYROXINE SODIUM 25 MCG PO TABS
50.0000 ug | ORAL_TABLET | Freq: Every day | ORAL | Status: DC
Start: 2024-01-15 — End: 2024-01-15
  Administered 2024-01-15: 50 ug via ORAL
  Filled 2024-01-14: qty 2

## 2024-01-14 MED ORDER — ONDANSETRON HCL 4 MG PO TABS: 4.0000 mg | ORAL_TABLET | Freq: Four times a day (QID) | ORAL | Status: DC | PRN

## 2024-01-14 MED ORDER — ONDANSETRON HCL 4 MG/2ML IJ SOLN: 4.0000 mg | Freq: Four times a day (QID) | INTRAMUSCULAR | Status: DC | PRN

## 2024-01-14 MED ORDER — CEFAZOLIN SODIUM-DEXTROSE 2-4 GM/100ML-% IV SOLN
2.0000 g | Freq: Four times a day (QID) | INTRAVENOUS | Status: AC
  Administered 2024-01-14 (×2): 2 g via INTRAVENOUS
  Filled 2024-01-14 (×2): qty 100

## 2024-01-14 MED ORDER — DOCUSATE SODIUM 100 MG PO CAPS
100.0000 mg | ORAL_CAPSULE | Freq: Two times a day (BID) | ORAL | Status: DC
  Administered 2024-01-14 – 2024-01-15 (×2): 100 mg via ORAL
  Filled 2024-01-14: qty 1

## 2024-01-14 MED ORDER — ACETAMINOPHEN 500 MG PO TABS
1000.0000 mg | ORAL_TABLET | Freq: Three times a day (TID) | ORAL | Status: DC
  Administered 2024-01-14: 1000 mg via ORAL
  Filled 2024-01-14: qty 2

## 2024-01-14 MED ORDER — CEFAZOLIN SODIUM-DEXTROSE 2-4 GM/100ML-% IV SOLN
INTRAVENOUS | Status: AC
  Filled 2024-01-14: qty 100

## 2024-01-14 MED ORDER — METOCLOPRAMIDE HCL 10 MG PO TABS: 5.0000 mg | ORAL_TABLET | Freq: Three times a day (TID) | ORAL | Status: DC | PRN

## 2024-01-14 MED ORDER — FENTANYL CITRATE (PF) 100 MCG/2ML IJ SOLN
INTRAMUSCULAR | Status: AC
  Filled 2024-01-14: qty 2

## 2024-01-14 MED ORDER — ROSUVASTATIN CALCIUM 20 MG PO TABS
ORAL_TABLET | ORAL | Status: AC
  Filled 2024-01-14: qty 1

## 2024-01-14 MED ORDER — SODIUM CHLORIDE (PF) 0.9 % IJ SOLN
INTRAMUSCULAR | Status: DC | PRN
  Administered 2024-01-14: 71 mL via INTRAMUSCULAR

## 2024-01-14 MED ORDER — METOPROLOL TARTRATE 25 MG PO TABS
25.0000 mg | ORAL_TABLET | Freq: Two times a day (BID) | ORAL | Status: DC
  Administered 2024-01-14 – 2024-01-15 (×2): 25 mg via ORAL

## 2024-01-14 MED ORDER — ENOXAPARIN SODIUM 30 MG/0.3ML IJ SOSY
30.0000 mg | PREFILLED_SYRINGE | Freq: Two times a day (BID) | INTRAMUSCULAR | Status: DC
  Administered 2024-01-15: 30 mg via SUBCUTANEOUS
  Filled 2024-01-14: qty 0.3

## 2024-01-14 MED ORDER — TRAMADOL HCL 50 MG PO TABS
50.0000 mg | ORAL_TABLET | Freq: Four times a day (QID) | ORAL | Status: DC | PRN
  Administered 2024-01-14 – 2024-01-15 (×2): 50 mg via ORAL
  Filled 2024-01-14 (×2): qty 1

## 2024-01-14 MED ORDER — ACETAMINOPHEN 325 MG PO TABS
325.0000 mg | ORAL_TABLET | Freq: Four times a day (QID) | ORAL | Status: DC | PRN
  Administered 2024-01-15: 325 mg via ORAL
  Filled 2024-01-14: qty 1

## 2024-01-14 MED ORDER — OXYCODONE HCL 5 MG/5ML PO SOLN: 5.0000 mg | Freq: Once | ORAL | Status: DC | PRN

## 2024-01-14 MED ORDER — SURGIPHOR WOUND IRRIGATION SYSTEM - OPTIME: TOPICAL | Status: DC | PRN

## 2024-01-14 MED ORDER — LOSARTAN POTASSIUM 50 MG PO TABS
25.0000 mg | ORAL_TABLET | Freq: Two times a day (BID) | ORAL | Status: DC
  Administered 2024-01-14 – 2024-01-15 (×2): 25 mg via ORAL
  Filled 2024-01-14: qty 1

## 2024-01-14 MED ORDER — PHENOL 1.4 % MT LIQD: 1.0000 | OROMUCOSAL | Status: DC | PRN

## 2024-01-14 MED ORDER — SODIUM CHLORIDE (PF) 0.9 % IJ SOLN
INTRAMUSCULAR | Status: AC
  Filled 2024-01-14: qty 20

## 2024-01-14 MED ORDER — OXYCODONE HCL 5 MG PO TABS: 5.0000 mg | ORAL_TABLET | Freq: Once | ORAL | Status: DC | PRN

## 2024-01-14 MED ORDER — MIDAZOLAM HCL 5 MG/5ML IJ SOLN
INTRAMUSCULAR | Status: DC | PRN
  Administered 2024-01-14: 1 mg via INTRAVENOUS

## 2024-01-14 MED ORDER — KETOROLAC TROMETHAMINE 15 MG/ML IJ SOLN
INTRAMUSCULAR | Status: AC
  Filled 2024-01-14: qty 1

## 2024-01-14 MED ORDER — PHENYLEPHRINE 80 MCG/ML (10ML) SYRINGE FOR IV PUSH (FOR BLOOD PRESSURE SUPPORT)
PREFILLED_SYRINGE | INTRAVENOUS | Status: DC | PRN
  Administered 2024-01-14: 80 ug via INTRAVENOUS
  Administered 2024-01-14: 160 ug via INTRAVENOUS

## 2024-01-14 MED ORDER — PROPOFOL 1000 MG/100ML IV EMUL
INTRAVENOUS | Status: AC
  Filled 2024-01-14: qty 100

## 2024-01-14 MED ORDER — MENTHOL 3 MG MT LOZG: 1.0000 | LOZENGE | OROMUCOSAL | Status: DC | PRN

## 2024-01-14 MED ORDER — PHENYLEPHRINE HCL-NACL 20-0.9 MG/250ML-% IV SOLN
INTRAVENOUS | Status: DC | PRN
  Administered 2024-01-14: 40 ug/min via INTRAVENOUS

## 2024-01-14 MED ORDER — PROPOFOL 500 MG/50ML IV EMUL
INTRAVENOUS | Status: DC | PRN
  Administered 2024-01-14: 100 ug/kg/min via INTRAVENOUS

## 2024-01-14 MED ORDER — BUPIVACAINE-EPINEPHRINE (PF) 0.25% -1:200000 IJ SOLN
INTRAMUSCULAR | Status: AC
  Filled 2024-01-14: qty 30

## 2024-01-14 MED ORDER — MORPHINE SULFATE (PF) 2 MG/ML IV SOLN
0.5000 mg | INTRAVENOUS | Status: DC | PRN
  Administered 2024-01-14: 1 mg via INTRAVENOUS
  Filled 2024-01-14: qty 1

## 2024-01-14 MED ORDER — PANTOPRAZOLE SODIUM 40 MG PO TBEC
40.0000 mg | DELAYED_RELEASE_TABLET | Freq: Every day | ORAL | Status: DC
  Administered 2024-01-14 – 2024-01-15 (×2): 40 mg via ORAL
  Filled 2024-01-14: qty 1

## 2024-01-14 MED ORDER — MIDAZOLAM HCL 2 MG/2ML IJ SOLN
INTRAMUSCULAR | Status: AC
  Filled 2024-01-14: qty 2

## 2024-01-14 MED ORDER — EPHEDRINE SULFATE-NACL 50-0.9 MG/10ML-% IV SOSY
PREFILLED_SYRINGE | INTRAVENOUS | Status: DC | PRN
  Administered 2024-01-14: 5 mg via INTRAVENOUS

## 2024-01-14 MED ORDER — CHLORHEXIDINE GLUCONATE 0.12 % MT SOLN
OROMUCOSAL | Status: AC
  Filled 2024-01-14: qty 15

## 2024-01-14 MED ORDER — SODIUM CHLORIDE 0.9 % IV SOLN: INTRAVENOUS | Status: DC

## 2024-01-14 MED ORDER — KETOROLAC TROMETHAMINE 15 MG/ML IJ SOLN
7.5000 mg | Freq: Four times a day (QID) | INTRAMUSCULAR | Status: AC
  Administered 2024-01-14 – 2024-01-15 (×4): 7.5 mg via INTRAVENOUS
  Filled 2024-01-14 (×2): qty 1

## 2024-01-14 MED ORDER — ASPIRIN 81 MG PO CHEW
81.0000 mg | CHEWABLE_TABLET | Freq: Every day | ORAL | Status: DC
  Administered 2024-01-15: 81 mg via ORAL

## 2024-01-14 SURGICAL SUPPLY — 72 items
BLADE REAMER PATELLA SZ29 (BLADE) IMPLANT
BLADE SAGITTAL AGGR TOOTH XLG (BLADE) IMPLANT
BLADE SAW 90X13X1.19 OSCILLAT (BLADE) IMPLANT
BLADE SAW SAG 25X90X1.19 (BLADE) ×1 IMPLANT
BLADE SAW SAG 29X58X.64 (BLADE) ×1 IMPLANT
BNDG ELASTIC 6INX 5YD STR LF (GAUZE/BANDAGES/DRESSINGS) ×1 IMPLANT
BOWL CEMENT MIX W/ADAPTER (MISCELLANEOUS) IMPLANT
BRUSH SCRUB EZ PLAIN DRY (MISCELLANEOUS) ×1 IMPLANT
CEMENT BONE R 1X40 (Cement) IMPLANT
CHLORAPREP W/TINT 26 (MISCELLANEOUS) ×2 IMPLANT
COMP FEM CR CEMT STD SZ5 (Joint) ×1 IMPLANT
COMPONENT FEM CR CEMT STD SZ5 (Joint) IMPLANT
COOLER POLAR GLACIER W/PUMP (MISCELLANEOUS) ×1 IMPLANT
CUFF TRNQT CYL 24X4X16.5-23 (TOURNIQUET CUFF) IMPLANT
CUFF TRNQT CYL 30X4X21-28X (TOURNIQUET CUFF) IMPLANT
DERMABOND ADVANCED .7 DNX12 (GAUZE/BANDAGES/DRESSINGS) ×1 IMPLANT
DRAPE SHEET LG 3/4 BI-LAMINATE (DRAPES) ×2 IMPLANT
DRSG MEPILEX SACRM 8.7X9.8 (GAUZE/BANDAGES/DRESSINGS) ×1 IMPLANT
DRSG OPSITE POSTOP 4X10 (GAUZE/BANDAGES/DRESSINGS) IMPLANT
DRSG OPSITE POSTOP 4X8 (GAUZE/BANDAGES/DRESSINGS) IMPLANT
ELECT REM PT RETURN 9FT ADLT (ELECTROSURGICAL) ×1 IMPLANT
ELECTRODE REM PT RTRN 9FT ADLT (ELECTROSURGICAL) ×1 IMPLANT
GLOVE BIO SURGEON STRL SZ8 (GLOVE) ×1 IMPLANT
GLOVE BIOGEL PI IND STRL 8 (GLOVE) ×1 IMPLANT
GLOVE PI ORTHO PRO STRL 7.5 (GLOVE) ×2 IMPLANT
GLOVE PI ORTHO PRO STRL SZ8 (GLOVE) ×2 IMPLANT
GLOVE SURG SYN 7.5 E (GLOVE) ×1 IMPLANT
GLOVE SURG SYN 7.5 PF PI (GLOVE) ×1 IMPLANT
GOWN SRG XL LVL 3 NONREINFORCE (GOWNS) ×1 IMPLANT
GOWN STRL REUS W/ TWL LRG LVL3 (GOWN DISPOSABLE) ×1 IMPLANT
GOWN STRL REUS W/ TWL XL LVL3 (GOWN DISPOSABLE) ×1 IMPLANT
HOOD PEEL AWAY T7 (MISCELLANEOUS) ×2 IMPLANT
IMPL ASF RT PSN 4-5/CD 10 (Joint) IMPLANT
IMPLANT ASF RT PSN 4-5/CD 10 (Joint) ×1 IMPLANT
KIT TURNOVER KIT A (KITS) ×1 IMPLANT
MANIFOLD NEPTUNE II (INSTRUMENTS) ×1 IMPLANT
MARKER SKIN DUAL TIP RULER LAB (MISCELLANEOUS) ×1 IMPLANT
MAT ABSORB FLUID 56X50 GRAY (MISCELLANEOUS) ×1 IMPLANT
NDL HYPO 21X1.5 SAFETY (NEEDLE) ×1 IMPLANT
NEEDLE HYPO 21X1.5 SAFETY (NEEDLE) ×1 IMPLANT
PACK TOTAL KNEE (MISCELLANEOUS) ×1 IMPLANT
PAD ARMBOARD POSITIONER FOAM (MISCELLANEOUS) ×3 IMPLANT
PAD WRAPON POLAR KNEE (MISCELLANEOUS) ×1 IMPLANT
PENCIL SMOKE EVACUATOR (MISCELLANEOUS) ×1 IMPLANT
PIN DRILL HDLS TROCAR 75 4PK (PIN) IMPLANT
PULSAVAC PLUS IRRIG FAN TIP (DISPOSABLE) ×1 IMPLANT
SCREW FEMALE HEX FIX 25X2.5 (ORTHOPEDIC DISPOSABLE SUPPLIES) IMPLANT
SCREW HEX HEADED 3.5X27 DISP (ORTHOPEDIC DISPOSABLE SUPPLIES) IMPLANT
SLEEVE SCD COMPRESS KNEE MED (STOCKING) ×1 IMPLANT
SOL .9 NS 3000ML IRR UROMATIC (IV SOLUTION) ×1 IMPLANT
SOLUTION IRRIG SURGIPHOR (IV SOLUTION) ×1 IMPLANT
STEM POLY PAT PLY 29M KNEE (Knees) IMPLANT
STEM TIB ST PERS 14+30 (Stem) IMPLANT
STEM TIBIA 5 DEG SZ D R KNEE (Knees) IMPLANT
STOCKINETTE IMPERV 14X48 (MISCELLANEOUS) ×1 IMPLANT
SUT STRATA 1 CT-1 DLB (SUTURE) ×1 IMPLANT
SUT STRATAFIX 14 PDO 36 VLT (SUTURE) ×1 IMPLANT
SUT STRATAFIX PDO 1 14 VIOLET (SUTURE) ×1 IMPLANT
SUT VIC AB 0 CT1 36 (SUTURE) ×1 IMPLANT
SUT VIC AB 2-0 CT2 27 (SUTURE) ×2 IMPLANT
SUT VICRYL 1-0 27IN ABS (SUTURE) ×1 IMPLANT
SUTURE STRATA 1 CT-1 DLB (SUTURE) ×1 IMPLANT
SUTURE STRATA SPIR 4-0 18 (SUTURE) ×1 IMPLANT
SUTURE VICRYL 1-0 27IN ABS (SUTURE) ×1 IMPLANT
SYR 20ML LL LF (SYRINGE) ×2 IMPLANT
TAPE CLOTH 3X10 WHT NS LF (GAUZE/BANDAGES/DRESSINGS) ×1 IMPLANT
TIBIA STEM 5 DEG SZ D R KNEE (Knees) ×1 IMPLANT
TIP FAN IRRIG PULSAVAC PLUS (DISPOSABLE) ×1 IMPLANT
TOWEL OR 17X26 4PK STRL BLUE (TOWEL DISPOSABLE) IMPLANT
TRAP FLUID SMOKE EVACUATOR (MISCELLANEOUS) ×1 IMPLANT
WATER STERILE IRR 1000ML POUR (IV SOLUTION) ×1 IMPLANT
WRAPON POLAR PAD KNEE (MISCELLANEOUS) ×1 IMPLANT

## 2024-01-14 NOTE — Transfer of Care (Signed)
 Immediate Anesthesia Transfer of Care Note  Patient: Claudia Martin  Procedure(s) Performed: ARTHROPLASTY, KNEE, TOTAL (Right: Knee)  Patient Location: PACU  Anesthesia Type:Spinal  Level of Consciousness: drowsy and patient cooperative  Airway & Oxygen Therapy: Patient Spontanous Breathing and Patient connected to face mask oxygen  Post-op Assessment: Report given to RN and Post -op Vital signs reviewed and stable  Post vital signs: Reviewed and stable  Last Vitals:  Vitals Value Taken Time  BP 117/58 01/14/24 1000  Temp    Pulse 78 01/14/24 1001  Resp 15 01/14/24 1001  SpO2 100 % 01/14/24 1001  Vitals shown include unfiled device data.  Last Pain:  Vitals:   01/14/24 0635  TempSrc: Oral  PainSc: 8          Complications: No notable events documented.

## 2024-01-14 NOTE — Evaluation (Signed)
 Physical Therapy Evaluation Patient Details Name: Claudia Martin MRN: [id number] DOB: 04-Nov-1945 Today's Date: 01/14/2024  History of Present Illness  Patient is a 78 year old female admitted for R TKA. She has PMH to include L TKA, sleep apnea, cardiac stents with past MI, hypothyroidism, renal disease.  Clinical Impression  Patient received in bed, she is able to wiggle toes and has slightly diminished sensation in R LE. Patient is agreeable to PT assessment. Daughter present. She requires mod A with supine to sit. She is able to stand with cga and ambulated 25 feet with RW and cga. Patient will continue to benefit from skilled PT to improve functional independence, safety and strength/rom.          If plan is discharge home, recommend the following: A little help with walking and/or transfers;A little help with bathing/dressing/bathroom;Assist for transportation;Help with stairs or ramp for entrance;Assistance with cooking/housework   Can travel by private vehicle    yes    Equipment Recommendations None recommended by PT  Recommendations for Other Services       Functional Status Assessment Patient has had a recent decline in their functional status and demonstrates the ability to make significant improvements in function in a reasonable and predictable amount of time.     Precautions / Restrictions Precautions Precautions: Fall Recall of Precautions/Restrictions: Intact Restrictions Weight Bearing Restrictions Per Provider Order: Yes RLE Weight Bearing Per Provider Order: Weight bearing as tolerated      Mobility  Bed Mobility Overal bed mobility: Needs Assistance Bed Mobility: Supine to Sit     Supine to sit: Mod assist, HOB elevated     General bed mobility comments: Required min/mod A with R LE and trunk elevation    Transfers Overall transfer level: Needs assistance Equipment used: Rolling walker (2 wheels) Transfers: Sit to/from Stand Sit to Stand:  Contact guard assist                Ambulation/Gait Ambulation/Gait assistance: Contact guard assist Gait Distance (Feet): 25 Feet Assistive device: Rolling walker (2 wheels) Gait Pattern/deviations: Step-through pattern, Decreased step length - right, Decreased step length - left Gait velocity: decr     General Gait Details: patient ambulated in room with RW, cga. Good tolerance this pm.  Stairs            Wheelchair Mobility     Tilt Bed    Modified Rankin (Stroke Patients Only)       Balance Overall balance assessment: Needs assistance Sitting-balance support: Feet supported Sitting balance-Leahy Scale: Good     Standing balance support: Bilateral upper extremity supported, During functional activity, Reliant on assistive device for balance Standing balance-Leahy Scale: Good                               Pertinent Vitals/Pain Pain Assessment Pain Assessment: Faces Faces Pain Scale: Hurts a little bit Pain Location: R knee Pain Descriptors / Indicators: Discomfort Pain Intervention(s): Monitored during session, Repositioned, Premedicated before session, Ice applied    Home Living Family/patient expects to be discharged to:: Private residence Living Arrangements: Alone Available Help at Discharge: Family;Available PRN/intermittently (daughter is planning to stay with her upon discharge) Type of Home: House Home Access: Stairs to enter Entrance Stairs-Rails: None Entrance Stairs-Number of Steps: 1 step at [employer] Layout: One level;Laundry or work area in [employer] Equipment: Agricultural consultant (2 wheels);Cane - single point;Shower seat;BSC/3in1  Prior Function Prior Level of Function : Needs assist             Mobility Comments: was in a lot of pain prior to surgery, was using RW ADLs Comments: needed some help     Extremity/Trunk Assessment   Upper Extremity Assessment Upper Extremity Assessment: Overall WFL for  tasks assessed    Lower Extremity Assessment Lower Extremity Assessment: RLE deficits/detail RLE Sensation: decreased light touch RLE Coordination: decreased gross motor    Cervical / Trunk Assessment Cervical / Trunk Assessment: Normal  Communication   Communication Communication: No apparent difficulties    Cognition Arousal: Alert Behavior During Therapy: WFL for tasks assessed/performed   PT - Cognitive impairments: No apparent impairments                         Following commands: Intact       Cueing Cueing Techniques: Verbal cues     General Comments      Exercises     Assessment/Plan    PT Assessment Patient needs continued PT services  PT Problem List Decreased strength;Decreased activity tolerance;Decreased mobility;Decreased safety awareness;Decreased knowledge of use of DME;Pain       PT Treatment Interventions DME instruction;Gait training;Functional mobility training;Stair training;Therapeutic activities;Therapeutic exercise;Balance training;Neuromuscular re-education;Patient/family education    PT Goals (Current goals can be found in the Care Plan section)  Acute Rehab PT Goals Patient Stated Goal: return home, decrease pain, improve mobility PT Goal Formulation: With patient/family Time For Goal Achievement: 01/21/24 Potential to Achieve Goals: Good    Frequency BID     Co-evaluation               AM-PAC PT "6 Clicks" Mobility  Outcome Measure Help needed turning from your back to your side while in a flat bed without using bedrails?: A Little Help needed moving from lying on your back to sitting on the side of a flat bed without using bedrails?: A Lot Help needed moving to and from a bed to a chair (including a wheelchair)?: A Little Help needed standing up from a chair using your arms (e.g., wheelchair or bedside chair)?: A Little Help needed to walk in hospital room?: A Little Help needed climbing 3-5 steps with a  railing? : A Little 6 Click Score: 17    End of Session   Activity Tolerance: Patient tolerated treatment well Patient left: in chair;with call bell/phone within reach;with family/visitor present Nurse Communication: Mobility status PT Visit Diagnosis: Other abnormalities of gait and mobility (R26.89);Difficulty in walking, not elsewhere classified (R26.2);Pain Pain - Right/Left: Right Pain - part of body: Knee    Time: 1455-1513 PT Time Calculation (min) (ACUTE ONLY): 18 min   Charges:   PT Evaluation $PT Eval Moderate Complexity: 1 Mod PT Treatments $Gait Training: 8-22 mins PT General Charges $$ ACUTE PT VISIT: 1 Visit         Julieanne Hadsall, PT, GCS 01/14/24,3:22 PM

## 2024-01-14 NOTE — Anesthesia Preprocedure Evaluation (Signed)
 Anesthesia Evaluation  Patient identified by MRN, date of birth, ID band Patient awake    Reviewed: Allergy & Precautions, NPO status , Patient's Chart, lab work & pertinent test results  History of Anesthesia Complications (+) PONV and history of anesthetic complications  Airway Mallampati: III  TM Distance: >3 FB Neck ROM: full    Dental no notable dental hx.    Pulmonary sleep apnea    Pulmonary exam normal        Cardiovascular hypertension, + CAD, + Past MI and + Cardiac Stents  Normal cardiovascular exam+ dysrhythmias   EKG  Normal sinus rhythm Right bundle branch block Left anterior fascicular block Bifascicular block Lateral infarct (cited on or before 19-May-2022)  Most recent TTE performed on 07/17/2021  revealed a normal left ventricular systolic function with an EF of 55-60%. There were no regional wall motion abnormalities. Left ventricular diastolic Doppler parameters consistent with abnormal relaxation (G1DD). Right ventricular size and function normal. There was trivial tricuspid and mild mitral valve regurgitation. All transvalvular gradients were noted to be normal providing no evidence suggestive of valvular stenosis. Aorta normal in size with no evidence of ectasia or aneurysmal dilatation. Small pericardial effusion noted.    Neuro/Psych  Neuromuscular disease  negative psych ROS   GI/Hepatic Neg liver ROS,GERD  Medicated,,  Endo/Other  Hypothyroidism    Renal/GU      Musculoskeletal   Abdominal   Peds  Hematology negative hematology ROS (+)   Anesthesia Other Findings Past Medical History: No date: Allergic rhinitis No date: Aortic atherosclerosis (HCC) No date: Arthritis No date: Bifascicular block (RBBB + LAFB) 06/06/2021: CAD (coronary artery disease)     Comment:  a.) STEMI --> LHC: EF 55-65%, 100% RI --> PCI performed               placing a 2.5 x 18 mm Onyx Frontier DES No date:  Cellulitis and abscess 06/07/2021: Diastolic dysfunction     Comment:  a.) TTE 06/07/2021: EF 55-60%, no RWMAs, triv MR, mild               AoV sclerosis with no stenosis, G1DD; b.) TTE 07/17/2021:              EF >55%, mild LVH, no RWMAs; triv TR, mild MR, G1DD No date: Diverticulosis No date: Enlarged heart No date: Eyelid inflammation No date: Frequent nosebleeds No date: GERD (gastroesophageal reflux disease) No date: Hematuria syndrome No date: Hemorrhoids No date: Herpes genitalis No date: Herpes simplex No date: History of bilateral cataract extraction No date: History of colonic polyps 2021: History of methicillin resistant staphylococcus aureus (MRSA) No date: HTN (hypertension) No date: Hydronephrosis No date: Hyperlipidemia No date: Hypothyroidism No date: Incomplete bladder emptying No date: Internal hordeolum No date: Long term current use of antithrombotics/antiplatelets     Comment:  a.) on daily DAPT therapy (ASA + ticagrelor) x 1 year               s/p STEMI; ticagrelor has been discontinued No date: OSA on CPAP No date: PONV (postoperative nausea and vomiting) No date: Primary ovarian failure No date: Sciatica No date: Shortness of breath No date: Sinus bradycardia 06/06/2021: STEMI (ST elevation myocardial infarction) (HCC)     Comment:  a.) LHC/PCI 06/06/2021 --> 100% RI (2.5 x 18 mm Onyx               Frontier DES) No date: Urethral diverticulum No date: Vaginal atrophy No date: Vitamin B12  deficiency No date: Vitamin D deficiency  Past Surgical History: 05/27/2022: ABDOMINAL WALL DEFECT REPAIR; N/A     Comment:  Procedure: REPAIR ABDOMINAL WALL, reconstruction;                Surgeon: Leafy Ro, MD;  Location: [facility];                Service: General;  Laterality: N/A; No date: BREAST BIOPSY; Bilateral     Comment:  benign 06/05/2021: CATARACT EXTRACTION W/PHACO; Right     Comment:  Procedure: CATARACT EXTRACTION PHACO AND INTRAOCULAR                LENS PLACEMENT (IOC) RIGHT;  Surgeon: Lockie Mola, MD;  Location: [facility];  Service:               Ophthalmology;  Laterality: Right;  3.02 00:39.5 12/04/2021: CATARACT EXTRACTION W/PHACO; Left     Comment:  Procedure: CATARACT EXTRACTION PHACO AND INTRAOCULAR               LENS PLACEMENT (IOC) LEFT;  Surgeon: Lockie Mola, MD;  Location: [facility];  Service:               Ophthalmology;  Laterality: Left;  6.23 00:48.0 No date: COLON SURGERY     Comment:  Diverticulitis 03/15/2018: COLONOSCOPY WITH PROPOFOL; N/A     Comment:  Procedure: COLONOSCOPY WITH PROPOFOL;  Surgeon: Toney Reil, MD;  Location: [facility];  Service:               Gastroenterology;  Laterality: N/A; 01/09/2021: COLONOSCOPY WITH PROPOFOL; N/A     Comment:  Procedure: COLONOSCOPY WITH PROPOFOL;  Surgeon: Toney Reil, MD;  Location: [facility];  Service:               Gastroenterology;  Laterality: N/A; 06/06/2021: CORONARY/GRAFT ACUTE MI REVASCULARIZATION; N/A     Comment:  Procedure: Coronary/Graft Acute MI Revascularization;                Surgeon: Alwyn Pea, MD;  Location: [facility]              CV LAB;  Service: Cardiovascular;  Laterality: N/A; No date: ECTOPIC PREGNANCY SURGERY No date: fx thumb No date: HERNIA REPAIR 05/27/2022: INSERTION OF MESH; N/A     Comment:  Procedure: INSERTION OF MESH;  Surgeon: Leafy Ro,               MD;  Location: [facility];  Service: General;  Laterality:               N/A; 06/06/2021: LEFT HEART CATH AND CORONARY ANGIOGRAPHY; N/A     Comment:  Procedure: LEFT HEART CATH AND CORONARY ANGIOGRAPHY;                Surgeon: Alwyn Pea, MD;  Location: [facility]              CV LAB;  Service: Cardiovascular;  Laterality: N/A; 01/26/2023: TOTAL KNEE ARTHROPLASTY; Left     Comment:  Procedure: TOTAL KNEE ARTHROPLASTY;  Surgeon:  Audelia Acton,  Earna Coder, MD;  Location: [facility];  Service: Orthopedics;               Laterality: Left; 05/27/2022: VENTRAL HERNIA REPAIR; N/A     Comment:  Procedure: HERNIA REPAIR VENTRAL ADULT, recurrent;                Surgeon: Leafy Ro, MD;  Location: [facility];                Service: General;  Laterality: N/A;     Reproductive/Obstetrics negative OB ROS                             Anesthesia Physical Anesthesia Plan  ASA: 3  Anesthesia Plan: Spinal   Post-op Pain Management: Regional block*, Toradol IV (intra-op)* and Ofirmev IV (intra-op)*   Induction: Intravenous  PONV Risk Score and Plan: 3 and Propofol infusion, TIVA and Treatment may vary due to age or medical condition  Airway Management Planned: Natural Airway and Nasal Cannula  Additional Equipment:   Intra-op Plan:   Post-operative Plan:   Informed Consent: I have reviewed the patients History and Physical, chart, labs and discussed the procedure including the risks, benefits and alternatives for the proposed anesthesia with the patient or authorized representative who has indicated his/her understanding and acceptance.     Dental Advisory Given  Plan Discussed with: Anesthesiologist, CRNA and Surgeon  Anesthesia Plan Comments: (Patient reports no bleeding problems and no anticoagulant use.  Plan for spinal with backup GA  Patient consented for risks of anesthesia including but not limited to:  - adverse reactions to medications - damage to eyes, teeth, lips or other oral mucosa - nerve damage due to positioning  - risk of bleeding, infection and or nerve damage from spinal that could lead to paralysis - risk of headache or failed spinal - damage to teeth, lips or other oral mucosa - sore throat or hoarseness - damage to heart, brain, nerves, lungs, other parts of body or loss of life  Patient voiced understanding and assent.)        Anesthesia Quick  Evaluation

## 2024-01-14 NOTE — H&P (Signed)
 History of Present Illness: The patient is an 78 y.o. female presents for history and physical for right total knee arthroplasty with Dr. Audelia Acton on 01/14/2024. Patient having severe 10 out of 10 pain for many years. Pain has been increasing. She is tried conservative treatments including injections, medications such as Tylenol and NSAIDs with diminishing results. Pain is interfering with her quality life and activities a living. X-rays show significant valgus deformity of the right knee with complete loss of joint space in lateral compartment.  Patient is a non-smoker nondiabetic with a BMI of 36.8  Past Medical History: Past Medical History:  Diagnosis Date  Arthritis  Cataracts, bilateral  Environmental allergies  Hemorrhoids  Hernia  Hypertension  Umbilical hernia   Past Surgical History: Past Surgical History:  Procedure Laterality Date  Carpal Tunnel Release Bilateral  LAPAROSCOPY FOR ECTOPIC PREGNANCY  Surgery for Diverticulitis  Tubal Iigation  UMBILICAL HERNIA REPAIR   Past Family History: Family History  Problem Relation Age of Onset  Myocardial Infarction (Heart attack) Father  High blood pressure (Hypertension) Father  High blood pressure (Hypertension) Mother  Stroke Mother  Diabetes Mother  Kidney failure Mother  Diabetes Sister   Medications: Current Outpatient Medications  Medication Sig Dispense Refill  acetaminophen (TYLENOL) 325 MG tablet Take 325 mg by mouth every 8 (eight) hours as needed for Pain.  aspirin 81 MG chewable tablet Take 1 tablet (81 mg total) by mouth once daily 90 tablet 3  cetirizine (ZYRTEC) 10 MG tablet Take by mouth.  chlorhexidine (PERIDEX) 0.12 % solution SWISH AND SPIT with 5 milliliters by mouth twice a day if needed 0  EPIPEN 2-PAK 0.3 mg/0.3 mL (1:1,000) pen injector as needed. 0  losartan (COZAAR) 25 MG tablet TAKE 1 TABLET(25 MG) BY MOUTH EVERY DAY 90 tablet 3  metoprolol tartrate (LOPRESSOR) 25 MG tablet TAKE 1 TABLET(25 MG)  BY MOUTH TWICE DAILY 90 tablet 3  nitroGLYcerin (NITROSTAT) 0.4 MG SL tablet Place 0.4 mg under the tongue every 5 (five) minutes as needed DO NOT EXCEED A TOTAL OF 3 DOSES IN 15 MINUTES  rosuvastatin (CRESTOR) 5 MG tablet Take 5 mg by mouth once daily  SYNTHROID 50 mcg tablet Take 1 tablet by mouth once daily. 0  valACYclovir (VALTREX) 1000 MG tablet Take 1 tablet by mouth once daily. 0   No current facility-administered medications for this visit.   Allergies: Allergies  Allergen Reactions  Ioxaglate Sodium Diarrhea and Hives  Amoxicillin-Pot Clavulanate Other (See Comments)  Doxycycline Unknown  Dye Hives and Diarrhea  Ibuprofen Unknown  Iodine Hives  Nitrofurantoin Other (See Comments)  Other Diarrhea and Hives  Prednisone Nausea and Dizziness  Sulfamethoxazole-Trimethoprim Hives    Visit Vitals: Vitals:  12/30/23 1114  BP: (!) 140/82    Review of Systems:  A comprehensive 14 point ROS was performed, reviewed, and the pertinent orthopaedic findings are documented in the HPI.  Physical Exam: General:  Well developed, well nourished, no apparent distress, normal affect, antalgic gait  HEENT: Head normocephalic, atraumatic, PERRL.   Abdomen: Soft, non tender, non distended, Bowel sounds present.  Heart: Examination of the heart reveals regular, rate, and rhythm. There is no murmur noted on ascultation. There is a normal apical pulse.  Lungs: Lungs are clear to auscultation. There is no wheeze, rhonchi, or crackles. There is normal expansion of bilateral chest walls.   Comprehensive Knee Exam: Gait Not assessed due to severe pain in right knee unable to ambulate  Alignment Valgus on the right  Inspection Right  Skin Normal appearance with no obvious deformity. No ecchymosis or erythema.  Soft Tissue No focal soft tissue swelling  Quad Atrophy None   Palpation  Right  Tenderness Lateral joint line, medial joint line, and parapatellar tenderness palpation   Crepitus + patellofemoral and tibiofemoral crepitus  Effusion None   Range of Motion Right  Flexion 15-45 with severe pain  Extension Unable to fully extend due to severe pain   Ligamentous Exam Right  Lachman Unable to assess  Valgus 0 Unable to assess  Valgus 30 Normal  Varus 0 Unable to assess  Varus 30 Normal  Anterior Drawer Normal  Posterior Drawer Normal   Meniscal Exam Right  Hyperflexion Test Positive  Hyperextension Test Positive  McMurray's Positive   Neurovascular Right  Quadriceps Strength 5/5  Hamstring Strength 5/5  Hip Abductor Strength 4/5  Distal Motor Normal  Distal Sensory Normal light touch sensation  Distal Pulses Normal    Imaging Studies: I have reviewed AP, lateral,sunrise, and flexed PA weight bearing knee X-rays (4 views) of the right knee from 11/20/2023 that show severe degenerative changes with lateral joint space narrowing with bone-on-bone articulation and tricompartmental changes with osteophyte formation and sclerosis. There is valgus deformity measuring approximately 15 degrees. AP, sunrise, and flexed PA of the left knee also show status post total knee arthroplasty with components in appropriate position with no evidence of periprosthetic fracture or loosening.   Assessment:  ICD-10-CM  1. Primary osteoarthritis of right knee M17.11  Right knee osteoarthritis  Plan: Claudia Martin is a 78 year old female presents with right knee bone on bone arthritis in the lateral compartment of right knee with valgus deformity. She has had severe debilitating pain that interferes with her quality of life and activities day living. She has failed conservative treatment. Risks, benefits, complications of a right total knee arthroplasty have been discussed with the patient. Patient has agreed and consented procedure with Dr. Audelia Acton on 01/14/2024.   The hospitalization and post-operative care and rehabilitation were also discussed. The use of perioperative  antibiotics and DVT prophylaxis were discussed. The risk, benefits and alternatives to a surgical intervention were discussed at length with the patient. The patient was also advised of risks related to the medical comorbidities and elevated body mass index (BMI). A lengthy discussion took place to review the most common complications including but not limited to: stiffness, loss of function, complex regional pain syndrome, deep vein thrombosis, pulmonary embolus, heart attack, stroke, infection, wound breakdown, numbness, intraoperative fracture, damage to nerves, tendon,muscles, arteries or other blood vessels, death and other possible complications from anesthesia. The patient was told that we will take steps to minimize these risks by using sterile technique, antibiotics and DVT prophylaxis when appropriate and follow the patient postoperatively in the office setting to monitor progress. The possibility of recurrent pain, no improvement in pain and actual worsening of pain were also discussed with the patient.  All questions answered patient agrees with above plan to proceed with right total knee arthroplasty.   Reinaldo Berber MD

## 2024-01-14 NOTE — Op Note (Signed)
 Patient Name: Claudia Martin  UJW:[id number]  Pre-Operative Diagnosis: Right knee Osteoarthritis  Post-Operative Diagnosis: (same)  Procedure: Right Total Knee Arthroplasty  Components/Implants: Femur: Persona Size 5 CR   Tibia: Persona Size D w/ 14x59mm stem extension  Poly: 10mm MC  Patella: 29x18mm symmetric  Femoral Valgus Cut Angle: 5 degrees  Distal Femoral Re-cut: none  Patella Resurfacing: yes   Date of Surgery: 01/14/2024  Surgeon: Reinaldo Berber MD  Assistant: Amador Cunas PA (present and scrubbed throughout the case, critical for assistance with exposure, retraction, instrumentation, and closure)   Anesthesiologist: Joelene Millin  Anesthesia: Spinal   Tourniquet Time: 63 min  EBL: 50cc  IVF: 800cc  Complications: None   Brief history: The patient is a 78 year old female with a history of osteoarthritis of the right knee with pain limiting their range of motion and activities of daily living, which has failed multiple attempts at conservative therapy.  The risks and benefits of total knee arthroplasty as definitive surgical treatment were discussed with the patient, who opted to proceed with the operation.  After outpatient medical clearance and optimization was completed the patient was admitted to [facility] for the procedure.  All preoperative films were reviewed and an appropriate surgical plan was made prior to surgery. Preoperative range of motion was 15 to 50 with a 10 degree flexion contracture. The patient was identified as having a Valgus alignment.   Description of procedure: The patient was brought to the operating room where laterality was confirmed by all those present to be the right side.   Spinal anesthesia was administered and the patient received an intravenous dose of antibiotics for surgical prophylaxis and a dose of tranexamic acid.  Patient is positioned supine on the operating room table with all bony prominences well-padded.   A well-padded tourniquet was applied to the right thigh.  The knee was then prepped and draped in usual sterile fashion with multiple layers of adhesive and nonadhesive drapes.  All of those present in the operating room participated in a surgical timeout laterality and patient were confirmed.   An Esmarch was wrapped around the extremity and the leg was elevated and the knee flexed.  The tourniquet was inflated to a pressure of 250 mmHg. The Esmarch was removed and the leg was brought down to full extension.  The patella and tibial tubercle identified and outlined using a marking pen and a midline skin incision was made with a knife carried through the subcutaneous tissue down to the extensor retinaculum.  After exposure of the extensor mechanism the medial parapatellar arthrotomy was performed with a scalpel and electrocautery extending down medial and distal to the tibial tubercle taking care to avoid incising the patellar tendon.   A standard medial release was performed over the proximal tibia.  The knee was brought into extension in order to excise the fat pad taking care not to damage the patella tendon.  The superior soft tissue was removed from the anterior surface of the distal femur to visualize for the procedure.  The knee was then brought into flexion with the patella subluxed laterally and subluxing the tibia anteriorly.  The ACL was found to be absecnt and the remaining scar tissue was transected and removed with electrocautery and additional soft tissue was removed from the proximal surface of the tibia to fully expose. The PCL was found to be partially intact and was preserved.  An extramedullary tibial cutting guide was then applied to the leg with a spring-loaded ankle clamp  placed around the distal tibia just above the malleoli the angulation of the guide was adjusted to give some posterior slope in the tibial resection with an appropriate varus/valgus alignment.  The resection guide was  then pinned to the proximal tibia and the proximal tibial surface was resected with an oscillating saw.  Careful attention was paid to ensure the blade did not disrupt any of the soft tissues including any lateral or medial ligament.  Attention was then turned to the femur, with the knee slightly flexed a opening drill was used to enter the medullary canal of the femur.  After removing the drill marrow was suctioned out to decompress the distal femur.  An intramedullary femoral guide was then inserted into the drill hole and the alignment guide was seated firmly against the distal end of the medial femoral condyle.  The distal femoral cutting guide was then attached and pinned securely to the anterior surface of the femur and the intramedullary rod and alignment guide was removed.  Distal femur resection was then performed with an oscillating saw with retractors protecting medial and laterally.   The distal cutting block was then removed and the extension gap was checked with a spacer.  Extension gap was found to be appropriately sized to accommodate the spacer block.  The femoral sizing guide was then placed securely into the posterior condyles of the femur and the femoral size was measured and determined to be 5.  The size 5; 4-in-1 cutting guide was placed in position and secured with 2 pins.  The anterior posterior and chamfer resections were then performed with an oscillating saw.  Bony fragments and osteophytes were then removed.  Using a lamina spreader the posterior medial and lateral condyles were checked for additional osteophytes and posterior soft tissue remnants.  Any remaining meniscus was removed at this time.  Periarticular injection was performed in the meniscal rims and posterior capsule with aspiration performed to ensure no intravascular injection.   The tibia was then exposed and the tibial trial was pinned onto the plateau after confirming appropriate orientation and rotation.  Using  the drill bushing the tibia was prepared to the appropriate drill depth.  Tibial broach impactor was then driven through the punch guide using a mallet.  The femoral trial component was then inserted onto the femur. A trial tibial polyethylene bearing was then placed and the knee was reduced.  The knee achieved full extension with no hyperextension and was found to be balanced in flexion and extension with the trials in place.  The knee was then brought into full extension the patella was everted and held with 2 Kocher clamps.  The articular surface of the patella was then resected with an patella reamer and saw after careful measurement with a caliper.  The patella was then prepared with the drill guide and a trial patella was placed.  The knee was then taken through range of motion and it was found that the patella articulated appropriately with the trochlea and good patellofemoral motion without subluxation.    The correct final components for implantation were confirmed and opened by the circulator nurse.  A bone plug was fashioned from the patient's bone cuts to plug the intramedullary femoral canal access hole and was tamped into place.  The prepared surfaces of the patella femur and tibia were cleaned with pulsatile lavage to remove all blood fat and other material and then the surfaces were dried.  2 bags of cement were mixed under vacuum and  the components were cemented into place.  Excess cement was removed with curettes and forceps. A trial polyethylene tibial component was placed and the knee was brought into extension to allow the cement to set.  At this time the periarticular injection cocktail was placed in the soft tissues surrounding the knee.  After full curing of the cement the balance of the knee was checked again and the final polyethylene size was confirmed. The tibial component was irrigated and locking mechanism checked to ensure it was clear of debris. The real polyethylene tibial  component was implanted and the knee was brought through a range of motion.   The knee was then irrigated with copious amount of normal saline via pulsatile lavage to remove all loose bodies and other debris.  The knee was then irrigated with surgiphor betadine based wash and reirrigated with saline.  The tourniquet was then dropped and all bleeding vessels were identified and coagulated.  The arthrotomy was approximated with #1 Vicryl and closed with #1 Stratafix suture.  The knee was brought into slight flexion and the subcutaneous tissues were closed with 0 Vicryl, 2-0 Vicryl and a running subcuticular 4-0 stratafix barbed suture.  Skin was then glued with Dermabond.  A sterile adhesive dressing was then placed along with a sequential compression device to the calf, a Ted stocking, and a cryotherapy cuff.   Sponge, needle, and Lap counts were all correct at the end of the case.   The patient was transferred off of the operating room table to a hospital bed, good pulses were found distally on the operative side.  The patient was transferred to the recovery room in stable condition.

## 2024-01-14 NOTE — Interval H&P Note (Signed)
Patient history and physical updated. Consent reviewed including risks, benefits, and alternatives to surgery. Patient agrees with above plan to proceed with right total knee arthroplasty  

## 2024-01-14 NOTE — Plan of Care (Signed)

## 2024-01-14 NOTE — Anesthesia Procedure Notes (Addendum)
 Spinal  Start time: 01/14/2024 7:40 AM End time: 01/14/2024 7:49 AM Staffing Performed: resident/CRNA  Resident/CRNA: Lanell Matar, CRNA Performed by: Lanell Matar, CRNA Authorized by: Louie Boston, MD   Preanesthetic Checklist Completed: patient identified, IV checked, site marked, risks and benefits discussed, surgical consent, monitors and equipment checked, pre-op evaluation and timeout performed Spinal Block Patient position: sitting Prep: ChloraPrep Patient monitoring: heart rate, continuous pulse ox and blood pressure Approach: midline Location: L4-5 Injection technique: single-shot Needle Needle type: Pencan  Needle gauge: 24 G Needle length: 10 cm Needle insertion depth: 9 cm Assessment Sensory level: T10 Events: CSF return Additional Notes Sterile aseptic technique used throughout the procedure. Negative parasthesia. Negative blood return. Positive free flowing CSF. Patient tolerated procedure well without complications.

## 2024-01-14 NOTE — Progress Notes (Signed)
 Patient is not able to walk the distance required to go the bathroom, or he/she is unable to safely negotiate stairs required to access the bathroom.  A 3in1 BSC will alleviate this problem

## 2024-01-15 ENCOUNTER — Encounter: Payer: Self-pay | Admitting: Orthopedic Surgery

## 2024-01-15 DIAGNOSIS — M1711 Unilateral primary osteoarthritis, right knee: Secondary | ICD-10-CM | POA: Diagnosis not present

## 2024-01-15 LAB — BASIC METABOLIC PANEL WITH GFR
Anion gap: 8 (ref 5–15)
BUN: 24 mg/dL — ABNORMAL HIGH (ref 8–23)
CO2: 26 mmol/L (ref 22–32)
Calcium: 8.6 mg/dL — ABNORMAL LOW (ref 8.9–10.3)
Chloride: 104 mmol/L (ref 98–111)
Creatinine, Ser: 1.05 mg/dL — ABNORMAL HIGH (ref 0.44–1.00)
GFR, Estimated: 55 mL/min — ABNORMAL LOW (ref >60)
Glucose, Bld: 200 mg/dL — ABNORMAL HIGH (ref 70–99)
Potassium: 3.9 mmol/L (ref 3.5–5.1)
Sodium: 138 mmol/L (ref 135–145)

## 2024-01-15 LAB — CBC
HCT: 31 % — ABNORMAL LOW (ref 36.0–46.0)
Hemoglobin: 10.8 g/dL — ABNORMAL LOW (ref 12.0–15.0)
MCH: 31.7 pg (ref 26.0–34.0)
MCHC: 34.8 g/dL (ref 30.0–36.0)
MCV: 90.9 fL (ref 80.0–100.0)
Platelets: 196 10*3/uL (ref 150–400)
RBC: 3.41 MIL/uL — ABNORMAL LOW (ref 3.87–5.11)
RDW: 13.1 % (ref 11.5–15.5)
WBC: 9.8 10*3/uL (ref 4.0–10.5)
nRBC: 0 % (ref 0.0–0.2)

## 2024-01-15 MED ORDER — ONDANSETRON HCL 4 MG PO TABS: 4.0000 mg | ORAL_TABLET | Freq: Four times a day (QID) | ORAL | 0 refills | Status: DC | PRN

## 2024-01-15 MED ORDER — METOPROLOL TARTRATE 25 MG PO TABS
ORAL_TABLET | ORAL | Status: AC
  Filled 2024-01-15: qty 1

## 2024-01-15 MED ORDER — TRAMADOL HCL 50 MG PO TABS: 50.0000 mg | ORAL_TABLET | Freq: Four times a day (QID) | ORAL | 0 refills | Status: DC | PRN

## 2024-01-15 MED ORDER — ENOXAPARIN SODIUM 40 MG/0.4ML IJ SOSY: 40.0000 mg | PREFILLED_SYRINGE | INTRAMUSCULAR | 0 refills | Status: DC

## 2024-01-15 MED ORDER — DOCUSATE SODIUM 100 MG PO CAPS: 100.0000 mg | ORAL_CAPSULE | Freq: Two times a day (BID) | ORAL | 0 refills | Status: AC

## 2024-01-15 MED ORDER — PANTOPRAZOLE SODIUM 40 MG PO TBEC
DELAYED_RELEASE_TABLET | ORAL | Status: AC
  Filled 2024-01-15: qty 1

## 2024-01-15 MED ORDER — ASPIRIN 81 MG PO CHEW
CHEWABLE_TABLET | ORAL | Status: AC
  Filled 2024-01-15: qty 1

## 2024-01-15 MED ORDER — OXYCODONE HCL 5 MG PO TABS: 2.5000 mg | ORAL_TABLET | Freq: Three times a day (TID) | ORAL | 0 refills | Status: DC | PRN

## 2024-01-15 MED ORDER — ACETAMINOPHEN 500 MG PO TABS: 1000.0000 mg | ORAL_TABLET | Freq: Three times a day (TID) | ORAL | 0 refills | Status: AC

## 2024-01-15 NOTE — Discharge Summary (Addendum)
 Physician Discharge Summary  Patient ID: Claudia Martin MRN: [id number] DOB/AGE: [birth year] 78 y.o.  Admit date: 01/14/2024 Discharge date: 01/15/2024  Admission Diagnoses:  Primary osteoarthritis of right knee [M17.11] Right knee pain, unspecified chronicity [M25.561] S/P total knee arthroplasty, right [Z96.651]   Discharge Diagnoses: Patient Active Problem List   Diagnosis Date Noted   S/P total knee arthroplasty, right 01/14/2024   Mixed hyperlipidemia 10/01/2023   S/P TKR (total knee replacement) using cement, left 01/26/2023   Allergic rhinitis 07/02/2022   Wound dehiscence    S/P hernia repair 05/27/2022   Chronic allergic conjunctivitis 12/02/2021   Allergic rhinitis due to animal (cat) (dog) hair and dander 12/02/2021   Allergic rhinitis due to pollen 12/02/2021   STEMI (ST elevation myocardial infarction) (HCC) 06/06/2021   History of colonic polyps    Candidiasis of skin 01/04/2020   Ventral hernia without obstruction or gangrene 11/28/2019   Herpes simplex infection 10/27/2019   Stomatitis and mucositis 10/27/2019   Vitamin B12 deficiency 06/30/2018   Unspecified menopausal and perimenopausal disorder 06/30/2018   Vitamin D deficiency 06/30/2018   Acquired hypothyroidism 02/05/2018   Essential hypertension 02/05/2018   Obstructive sleep apnea (adult) (pediatric) 01/06/2018    Past Medical History:  Diagnosis Date   Allergic rhinitis    Aortic atherosclerosis (HCC)    Arthritis    Bifascicular block (RBBB + LAFB)    CAD (coronary artery disease) 06/06/2021   a.) STEMI --> LHC: EF 55-65%, 100% RI --> PCI performed placing a 2.5 x 18 mm Onyx Frontier DES   Cellulitis and abscess    Diastolic dysfunction 06/07/2021   a.) TTE 06/07/2021: EF 55-60%, no RWMAs, triv MR, mild AoV sclerosis with no stenosis, G1DD; b.) TTE 07/17/2021: EF >55%, mild LVH, no RWMAs; triv TR, mild MR, G1DD   Diverticulosis    Enlarged heart    Eyelid inflammation    Frequent  nosebleeds    GERD (gastroesophageal reflux disease)    Hematuria syndrome    Hemorrhoids    Herpes genitalis    Herpes simplex    History of bilateral cataract extraction    History of colonic polyps    History of methicillin resistant staphylococcus aureus (MRSA) 2021   HTN (hypertension)    Hydronephrosis    Hyperlipidemia    Hypothyroidism    Incomplete bladder emptying    Internal hordeolum    Long term current use of antithrombotics/antiplatelets    a.) on daily DAPT therapy (ASA + ticagrelor) x 1 year s/p STEMI; ticagrelor has been discontinued   OSA on CPAP    PONV (postoperative nausea and vomiting)    Primary ovarian failure    Sciatica    Shortness of breath    Sinus bradycardia    STEMI (ST elevation myocardial infarction) (HCC) 06/06/2021   a.) LHC/PCI 06/06/2021 --> 100% RI (2.5 x 18 mm Onyx Frontier DES)   Urethral diverticulum    Vaginal atrophy    Vitamin B12 deficiency    Vitamin D deficiency      Transfusion: none   Consultants (if any):   Discharged Condition: Improved  Hospital Course: Claudia Martin is an 78 y.o. female who was admitted 01/14/2024 with a diagnosis of S/P total knee arthroplasty, right and went to the operating room on 01/14/2024 and underwent the above named procedures.    Surgeries: Procedure(s): ARTHROPLASTY, KNEE, TOTAL on 01/14/2024 Patient tolerated the surgery well. Taken to PACU where she was stabilized and then transferred to the orthopedic floor.  Started on Lovenox 30 mg q 12 hrs. TEDs and SCDs applied bilaterally. Heels elevated on bed. No evidence of DVT. Negative Homan. Physical therapy started on day #1 for gait training and transfer. OT started day #1 for ADL and assisted devices.  Patient's IV was d/c on day #1. Patient was able to safely and independently complete all PT goals. PT recommending discharge to home.    On post op day #1 patient was stable and ready for discharge to home with HHPT.  Implants:  Femur: Persona Size 5 CR   Tibia: Persona Size D w/ 14x78mm stem extension  Poly: 10mm MC  Patella: 29x54mm symmetric   She was given perioperative antibiotics:  Anti-infectives (From admission, onward)    Start     Dose/Rate Route Frequency Ordered Stop   01/14/24 1400  ceFAZolin (ANCEF) IVPB 2g/100 mL premix        2 g 200 mL/hr over 30 Minutes Intravenous Every 6 hours 01/14/24 1314 01/14/24 2030   01/14/24 0600  ceFAZolin (ANCEF) IVPB 2g/100 mL premix        2 g 200 mL/hr over 30 Minutes Intravenous On call to O.R. 01/13/24 2302 01/14/24 0755     .  She was given sequential compression devices, early ambulation, and Lovenox TEDs for DVT prophylaxis.  She benefited maximally from the hospital stay and there were no complications.    Recent vital signs:  Vitals:   01/15/24 0600 01/15/24 0722  BP: (!) 140/81 137/60  Pulse: 60 (!) 59  Resp: 18 16  Temp: 98.9 F (37.2 C) 98.1 F (36.7 C)  SpO2: 96% 95%    Recent laboratory studies:  Lab Results  Component Value Date   HGB 10.8 (L) 01/15/2024   HGB 11.7 (L) 01/01/2024   HGB 11.4 (L) 01/27/2023   Lab Results  Component Value Date   WBC 9.8 01/15/2024   PLT 196 01/15/2024   Lab Results  Component Value Date   INR 1.0 03/12/2022   Lab Results  Component Value Date   NA 138 01/15/2024   K 3.9 01/15/2024   CL 104 01/15/2024   CO2 26 01/15/2024   BUN 24 (H) 01/15/2024   CREATININE 1.05 (H) 01/15/2024   GLUCOSE 200 (H) 01/15/2024    Discharge Medications:   Allergies as of 01/15/2024       Reactions   Augmentin [amoxicillin-pot Clavulanate]    Unknown reaction   Bactrim [sulfamethoxazole-trimethoprim] Hives   Doxycycline    Unknown reaction    Ibuprofen Itching   Iodinated Contrast Media Diarrhea, Other (See Comments)   Macrobid [nitrofurantoin] Hives   Prednisone Nausea Only   Dizziness   Sulfa Antibiotics Hives   Lavender Oil Rash        Medication List     TAKE these medications     acetaminophen 500 MG tablet Commonly known as: TYLENOL Take 2 tablets (1,000 mg total) by mouth every 8 (eight) hours. What changed:  when to take this reasons to take this   aspirin 81 MG chewable tablet Chew 1 tablet (81 mg total) by mouth daily.   cetirizine 10 MG tablet Commonly known as: ZYRTEC Take 10 mg by mouth every morning.   chlorhexidine 0.12 % solution Commonly known as: PERIDEX USE 5 ML AS DIRECTED IN THE MOUTH OR THROAT TWICE DAILY   docusate sodium 100 MG capsule Commonly known as: COLACE Take 1 capsule (100 mg total) by mouth 2 (two) times daily.   enoxaparin 40 MG/0.4ML injection  Commonly known as: LOVENOX Inject 0.4 mLs (40 mg total) into the skin daily for 14 days.   EPINEPHrine 0.3 mg/0.3 mL Soaj injection Commonly known as: EPI-PEN Inject 0.3 mg into the muscle as needed for anaphylaxis.   hydrOXYzine 25 MG capsule Commonly known as: VISTARIL Take 1 capsule (25 mg total) by mouth every 8 (eight) hours as needed.   levothyroxine 50 MCG tablet Commonly known as: SYNTHROID TAKE 1 TABLET(50 MCG) BY MOUTH DAILY BEFORE BREAKFAST   losartan 25 MG tablet Commonly known as: COZAAR Take 1 tablet (25 mg total) by mouth 2 (two) times daily.   metoprolol tartrate 25 MG tablet Commonly known as: LOPRESSOR Take 1 tablet (25 mg total) by mouth 2 (two) times daily.   miconazole 2 % vaginal cream Commonly known as: MONISTAT 7 Place 1 Applicatorful vaginally daily as needed (irritation).   nitroGLYCERIN 0.4 MG SL tablet Commonly known as: NITROSTAT Place 1 tablet (0.4 mg total) under the tongue every 5 (five) minutes x 3 doses as needed for chest pain.   nystatin-triamcinolone ointment Commonly known as: MYCOLOG Apply 1 Application topically 2 (two) times daily.   ondansetron 4 MG tablet Commonly known as: ZOFRAN Take 1 tablet (4 mg total) by mouth every 6 (six) hours as needed for nausea.   oxyCODONE 5 MG immediate release tablet Commonly known  as: Roxicodone Take 0.5 tablets (2.5 mg total) by mouth every 8 (eight) hours as needed for breakthrough pain.   rosuvastatin 5 MG tablet Commonly known as: CRESTOR Take 1 tablet (5 mg total) by mouth daily. What changed: when to take this   traMADol 50 MG tablet Commonly known as: ULTRAM Take 1 tablet (50 mg total) by mouth every 6 (six) hours as needed for moderate pain (pain score 4-6).   valACYclovir 1000 MG tablet Commonly known as: VALTREX TAKE 1 TABLET BY MOUTH DAILY. CAN TAKE 1 TABLET BY MOUTH TWICE DAILY IF FLARE UP FOR 7 DAYS               Durable Medical Equipment  (From admission, onward)           Start     Ordered   01/15/24 0751  For home use only DME Bedside commode  Once       Question:  Patient needs a bedside commode to treat with the following condition  Answer:  Impaired mobility   01/15/24 0750            Diagnostic Studies: DG Knee 1-2 Views Right Result Date: 01/14/2024 CLINICAL DATA:  Elective surgery. EXAM: RIGHT KNEE - 1-2 VIEW COMPARISON:  None Available. FINDINGS: Left knee arthroplasty in expected alignment. No periprosthetic lucency or fracture. There has been patellar resurfacing. Recent postsurgical change includes air and edema in the soft tissues and joint space. IMPRESSION: Left knee arthroplasty without immediate postoperative complication. Electronically Signed   By: Narda Rutherford M.D.   On: 01/14/2024 22:32    Disposition: Discharge disposition: 06-Home-Health Care Svc          Follow-up Information     Evon Slack, PA-C Follow up in 2 week(s).   Specialties: Orthopedic Surgery, Emergency Medicine Contact information: [address] [place] [address] [phone]                  Signed: Evon Slack 01/15/2024, 9:39 AM

## 2024-01-15 NOTE — Progress Notes (Signed)
 Patient was discharged home. No signs of acute distress. Discharge instructions given. Patient verbalized understanding.

## 2024-01-15 NOTE — Progress Notes (Signed)
 Physical Therapy Treatment Patient Details Name: Claudia Martin MRN: [id number] DOB: 04/05/1946 Today's Date: 01/15/2024   History of Present Illness Patient is a 78 year old female admitted for R TKA. She has PMH to include L TKA, sleep apnea, cardiac stents with past MI, hypothyroidism, renal disease.    PT Comments  Patient received in recliner, she feels well. Agrees to PT session. Patient transfers supervision, ambulated with RW and supervision > 200 feet and up/down 4 steps with B Rails and cga. Exercise handout provided and reviewed with patient. She verbalized and demonstrated understanding. All questions answered. Patient will continue to benefit from skilled PT to improve strength, independence and ROM of R knee.        If plan is discharge home, recommend the following: A little help with walking and/or transfers;A little help with bathing/dressing/bathroom;Assist for transportation;Help with stairs or ramp for entrance;Assistance with cooking/housework   Can travel by private vehicle      yes  Equipment Recommendations  None recommended by PT    Recommendations for Other Services       Precautions / Restrictions Precautions Precautions: Fall Recall of Precautions/Restrictions: Intact Restrictions Weight Bearing Restrictions Per Provider Order: Yes RLE Weight Bearing Per Provider Order: Weight bearing as tolerated     Mobility  Bed Mobility               General bed mobility comments: NT patient received in recliner and stayed in recliner at end of session    Transfers Overall transfer level: Modified independent Equipment used: Rolling walker (2 wheels) Transfers: Sit to/from Stand Sit to Stand: Supervision                Ambulation/Gait Ambulation/Gait assistance: Supervision Gait Distance (Feet): 200 Feet Assistive device: Rolling walker (2 wheels) Gait Pattern/deviations: Step-to pattern, Step-through pattern Gait velocity: decr      General Gait Details: began with step to gait pattern, progressing to step through with increased ambulation distance. Supervision.   Stairs Stairs: Yes Stairs assistance: Contact guard assist Stair Management: Two rails, Step to pattern, Forwards Number of Stairs: 4 General stair comments: requires cga for descending steps   Wheelchair Mobility     Tilt Bed    Modified Rankin (Stroke Patients Only)       Balance Overall balance assessment: Modified Independent Sitting-balance support: Feet supported Sitting balance-Leahy Scale: Normal     Standing balance support: Bilateral upper extremity supported, During functional activity, Reliant on assistive device for balance Standing balance-Leahy Scale: Good                              Communication Communication Communication: No apparent difficulties  Cognition Arousal: Alert Behavior During Therapy: WFL for tasks assessed/performed   PT - Cognitive impairments: No apparent impairments                         Following commands: Intact      Cueing Cueing Techniques: Verbal cues  Exercises Total Joint Exercises Ankle Circles/Pumps: AROM, Both, 5 reps Quad Sets: AROM, Both, 5 reps Short Arc Quad: AROM, Right, 5 reps Heel Slides: AROM, Right, 5 reps Hip ABduction/ADduction: AROM, Right, 5 reps Straight Leg Raises: AROM, Right, 5 reps Long Arc Quad: AROM, Right, 5 reps Goniometric ROM: 0-85    General Comments        Pertinent Vitals/Pain Pain Assessment Pain Assessment: 0-10 Pain Score: 3  Pain Location: R knee Pain Intervention(s): Monitored during session, Premedicated before session, Repositioned, Ice applied    Home Living                          Prior Function            PT Goals (current goals can now be found in the care plan section) Acute Rehab PT Goals Patient Stated Goal: return home, decrease pain, improve mobility PT Goal Formulation: With  patient Time For Goal Achievement: 01/21/24 Potential to Achieve Goals: Good Progress towards PT goals: Progressing toward goals    Frequency    BID      PT Plan      Co-evaluation              AM-PAC PT "6 Clicks" Mobility   Outcome Measure  Help needed turning from your back to your side while in a flat bed without using bedrails?: A Little Help needed moving from lying on your back to sitting on the side of a flat bed without using bedrails?: A Little Help needed moving to and from a bed to a chair (including a wheelchair)?: A Little Help needed standing up from a chair using your arms (e.g., wheelchair or bedside chair)?: A Little Help needed to walk in hospital room?: A Little Help needed climbing 3-5 steps with a railing? : A Little 6 Click Score: 18    End of Session   Activity Tolerance: Patient tolerated treatment well Patient left: in chair;with call bell/phone within reach Nurse Communication: Mobility status PT Visit Diagnosis: Difficulty in walking, not elsewhere classified (R26.2);Pain Pain - Right/Left: Right Pain - part of body: Knee     Time: 0937-1009 PT Time Calculation (min) (ACUTE ONLY): 32 min  Charges:    $Gait Training: 8-22 mins $Therapeutic Exercise: 8-22 mins PT General Charges $$ ACUTE PT VISIT: 1 Visit                     Terease Marcotte, PT, GCS 01/15/24,10:24 AM

## 2024-01-15 NOTE — Anesthesia Postprocedure Evaluation (Signed)
 Anesthesia Post Note  Patient: Claudia Martin  Procedure(s) Performed: ARTHROPLASTY, KNEE, TOTAL (Right: Knee)  Patient location during evaluation: Nursing Unit Anesthesia Type: Spinal Level of consciousness: oriented and awake and alert Pain management: pain level controlled Vital Signs Assessment: post-procedure vital signs reviewed and stable Respiratory status: spontaneous breathing and respiratory function stable Cardiovascular status: blood pressure returned to baseline and stable Postop Assessment: no headache, no backache, no apparent nausea or vomiting and patient able to bend at knees Anesthetic complications: no   No notable events documented.   Last Vitals:  Vitals:   01/15/24 0600 01/15/24 0722  BP: (!) 140/81 137/60  Pulse: 60 (!) 59  Resp: 18 16  Temp: 37.2 C 36.7 C  SpO2: 96% 95%    Last Pain:  Vitals:   01/15/24 0722  TempSrc: Oral  PainSc: 2                  Rosanne Gutting

## 2024-01-15 NOTE — Progress Notes (Signed)
 Subjective: 1 Day Post-Op Procedure(s) (LRB): ARTHROPLASTY, KNEE, TOTAL (Right) Patient reports pain as mild.   Patient is well, and has had no acute complaints or problems Denies any CP, SOB, ABD pain. We will continue therapy today.  Plan is to go Home after hospital stay.  Objective: Vital signs in last 24 hours: Temp:  [97.5 F (36.4 C)-98.9 F (37.2 C)] 98.1 F (36.7 C) (04/11 0722) Pulse Rate:  [58-85] 59 (04/11 0722) Resp:  [13-21] 16 (04/11 0722) BP: (117-156)/(58-89) 137/60 (04/11 0722) SpO2:  [94 %-100 %] 95 % (04/11 0722) Weight:  [74.8 kg] 74.8 kg (04/10 1129)  Intake/Output from previous day: 04/10 0701 - 04/11 0700 In: 1240 [P.O.:240; I.V.:900; IV Piggyback:100] Out: 825 [Urine:775; Blood:50] Intake/Output this shift: No intake/output data recorded.  Recent Labs    01/15/24 0527  HGB 10.8*   Recent Labs    01/15/24 0527  WBC 9.8  RBC 3.41*  HCT 31.0*  PLT 196   Recent Labs    01/15/24 0527  NA 138  K 3.9  CL 104  CO2 26  BUN 24*  CREATININE 1.05*  GLUCOSE 200*  CALCIUM 8.6*   No results for input(s): "LABPT", "INR" in the last 72 hours.  EXAM General - Patient is Alert, Appropriate, and Oriented Extremity - Neurovascular intact Sensation intact distally Intact pulses distally Dorsiflexion/Plantar flexion intact No cellulitis present Compartment soft Dressing - dressing C/D/I and no drainage Motor Function - intact, moving foot and toes well on exam.   Past Medical History:  Diagnosis Date   Allergic rhinitis    Aortic atherosclerosis (HCC)    Arthritis    Bifascicular block (RBBB + LAFB)    CAD (coronary artery disease) 06/06/2021   a.) STEMI --> LHC: EF 55-65%, 100% RI --> PCI performed placing a 2.5 x 18 mm Onyx Frontier DES   Cellulitis and abscess    Diastolic dysfunction 06/07/2021   a.) TTE 06/07/2021: EF 55-60%, no RWMAs, triv MR, mild AoV sclerosis with no stenosis, G1DD; b.) TTE 07/17/2021: EF >55%, mild LVH, no  RWMAs; triv TR, mild MR, G1DD   Diverticulosis    Enlarged heart    Eyelid inflammation    Frequent nosebleeds    GERD (gastroesophageal reflux disease)    Hematuria syndrome    Hemorrhoids    Herpes genitalis    Herpes simplex    History of bilateral cataract extraction    History of colonic polyps    History of methicillin resistant staphylococcus aureus (MRSA) 2021   HTN (hypertension)    Hydronephrosis    Hyperlipidemia    Hypothyroidism    Incomplete bladder emptying    Internal hordeolum    Long term current use of antithrombotics/antiplatelets    a.) on daily DAPT therapy (ASA + ticagrelor) x 1 year s/p STEMI; ticagrelor has been discontinued   OSA on CPAP    PONV (postoperative nausea and vomiting)    Primary ovarian failure    Sciatica    Shortness of breath    Sinus bradycardia    STEMI (ST elevation myocardial infarction) (HCC) 06/06/2021   a.) LHC/PCI 06/06/2021 --> 100% RI (2.5 x 18 mm Onyx Frontier DES)   Urethral diverticulum    Vaginal atrophy    Vitamin B12 deficiency    Vitamin D deficiency     Assessment/Plan:   1 Day Post-Op Procedure(s) (LRB): ARTHROPLASTY, KNEE, TOTAL (Right) Principal Problem:   S/P total knee arthroplasty, right  Estimated body mass index is 33.33  kg/m as calculated from the following:   Height as of this encounter: 4\' 11"  (1.499 m).   Weight as of this encounter: 74.8 kg. Advance diet Up with therapy Pain well-controlled Labs and vital signs are stable Care management to assist with discharge to home with home health PT today pending safe completion of PT goals.  DVT Prophylaxis - Lovenox, TED hose, and SCDs Weight-Bearing as tolerated to right leg   T. Cranston Neighbor, PA-C [facility] 01/15/2024, 8:05 AM

## 2024-01-15 NOTE — Discharge Instructions (Signed)
 Instructions after Total Knee Replacement   Claudia Martin M.D.     Dept. of Orthopaedics & Sports Medicine  [facility]  [address]  [place], [place]  [address]  Phone: [phone]   Fax: [phone]    DIET: Drink plenty of non-alcoholic fluids. Resume your normal diet. Include foods high in fiber.  ACTIVITY:  You may use crutches or a walker with weight-bearing as tolerated, unless instructed otherwise. You may be weaned off of the walker or crutches by your Physical Therapist.  Do NOT place pillows under the knee. Anything placed under the knee could limit your ability to straighten the knee.   Continue doing gentle exercises. Exercising will reduce the pain and swelling, increase motion, and prevent muscle weakness.   Please continue to use the TED compression stockings for 2 weeks. You may remove the stockings at night, but should reapply them in the morning. Do not drive or operate any equipment until instructed.  WOUND CARE:  Continue to use the PolarCare or ice packs periodically to reduce pain and swelling. You may begin showering 3 days after surgery with honeycomb dressing. Remove honeycomb dressing 7 days after surgery and continue showering. Allow dermabond to fall off on its own.  MEDICATIONS: You may resume your regular medications. Please take the pain medication as prescribed on the medication. Do not take pain medication on an empty stomach. You have been given a prescription for a blood thinner (Lovenox or Coumadin). Please take the medication as instructed. (NOTE: After completing a 2 week course of Lovenox, take one 81 mg Enteric-coated aspirin twice a day for 3 additional weeks. This along with elevation will help reduce the possibility of phlebitis in your operated leg.) Do not drive or drink alcoholic beverages when taking pain medications.  POSTOPERATIVE CONSTIPATION PROTOCOL Constipation - defined medically as fewer than three stools per  week and severe constipation as less than one stool per week.  One of the most common issues patients have following surgery is constipation.  Even if you have a regular bowel pattern at home, your normal regimen is likely to be disrupted due to multiple reasons following surgery.  Combination of anesthesia, postoperative narcotics, change in appetite and fluid intake all can affect your bowels.  In order to avoid complications following surgery, here are some recommendations in order to help you during your recovery period.  Colace (docusate) - Pick up an over-the-counter form of Colace or another stool softener and take twice a day as long as you are requiring postoperative pain medications.  Take with a full glass of water daily.  If you experience loose stools or diarrhea, hold the colace until you stool forms back up.  If your symptoms do not get better within 1 week or if they get worse, check with your doctor.  Dulcolax (bisacodyl) - Pick up over-the-counter and take as directed by the product packaging as needed to assist with the movement of your bowels.  Take with a full glass of water.  Use this product as needed if not relieved by Colace only.   MiraLax (polyethylene glycol) - Pick up over-the-counter to have on hand.  MiraLax is a solution that will increase the amount of water in your bowels to assist with bowel movements.  Take as directed and can mix with a glass of water, juice, soda, coffee, or tea.  Take if you go more than two days without a movement. Do not use MiraLax more than once per day.  Call your doctor if you are still constipated or irregular after using this medication for 7 days in a row.  If you continue to have problems with postoperative constipation, please contact the office for further assistance and recommendations.  If you experience "the worst abdominal pain ever" or develop nausea or vomiting, please contact the office immediatly for further recommendations for  treatment.   CALL THE OFFICE FOR: Temperature above 101 degrees Excessive bleeding or drainage on the dressing. Excessive swelling, coldness, or paleness of the toes. Persistent nausea and vomiting.  FOLLOW-UP:  You should have an appointment to return to the office in 14 days after surgery. Arrangements have been made for continuation of Physical Therapy (either home therapy or outpatient therapy).

## 2024-01-16 ENCOUNTER — Encounter: Payer: Self-pay | Admitting: Nurse Practitioner

## 2024-02-15 ENCOUNTER — Encounter: Payer: Self-pay | Admitting: Physician Assistant

## 2024-02-15 ENCOUNTER — Ambulatory Visit (INDEPENDENT_AMBULATORY_CARE_PROVIDER_SITE_OTHER): Admitting: Physician Assistant

## 2024-02-15 VITALS — BP 129/62 | HR 72 | Resp 16 | Ht <= 58 in | Wt 159.0 lb

## 2024-02-15 DIAGNOSIS — R300 Dysuria: Secondary | ICD-10-CM

## 2024-02-15 DIAGNOSIS — N390 Urinary tract infection, site not specified: Secondary | ICD-10-CM

## 2024-02-15 LAB — POCT URINALYSIS DIPSTICK
Bilirubin, UA: NEGATIVE
Glucose, UA: NEGATIVE
Ketones, UA: NEGATIVE
Nitrite, UA: NEGATIVE
Protein, UA: NEGATIVE
Spec Grav, UA: 1.015 (ref 1.010–1.025)
Urobilinogen, UA: 0.2 U/dL
pH, UA: 5 (ref 5.0–8.0)

## 2024-02-15 MED ORDER — CIPROFLOXACIN HCL 500 MG PO TABS: 500.0000 mg | ORAL_TABLET | Freq: Two times a day (BID) | ORAL | 0 refills | Status: AC

## 2024-02-15 NOTE — Progress Notes (Signed)
 [facility] [address] [place], [place] [address]  Internal MEDICINE  Office Visit Note  Patient Name: Claudia Martin  [id number]  [id number]  Date of Service: 02/15/2024  Chief Complaint  Patient presents with   Acute Visit   Urinary Tract Infection     HPI Pt is here for a sick visit. -UTI symptoms for the last week. Started with frequency and burning. Some lower pelvic pressure. Odor present -trying to drink more water and cranberry -1 month postop from right TKR and reports she is doing well with this  Current Medication:  Outpatient Encounter Medications as of 02/15/2024  Medication Sig Note   acetaminophen  (TYLENOL ) 500 MG tablet Take 2 tablets (1,000 mg total) by mouth every 8 (eight) hours.    aspirin  81 MG chewable tablet Chew 1 tablet (81 mg total) by mouth daily.    cetirizine (ZYRTEC) 10 MG tablet Take 10 mg by mouth every morning.    chlorhexidine  (PERIDEX ) 0.12 % solution USE 5 ML AS DIRECTED IN THE MOUTH OR THROAT TWICE DAILY 01/14/2024: prn   ciprofloxacin  (CIPRO ) 500 MG tablet Take 1 tablet (500 mg total) by mouth 2 (two) times daily for 7 days.    docusate sodium  (COLACE) 100 MG capsule Take 1 capsule (100 mg total) by mouth 2 (two) times daily.    EPINEPHrine  0.3 mg/0.3 mL IJ SOAJ injection Inject 0.3 mg into the muscle as needed for anaphylaxis. 01/14/2024: prn   hydrOXYzine  (VISTARIL ) 25 MG capsule Take 1 capsule (25 mg total) by mouth every 8 (eight) hours as needed.    levothyroxine  (SYNTHROID ) 50 MCG tablet TAKE 1 TABLET(50 MCG) BY MOUTH DAILY BEFORE BREAKFAST    losartan  (COZAAR ) 25 MG tablet Take 1 tablet (25 mg total) by mouth 2 (two) times daily.    miconazole (MONISTAT 7) 2 % vaginal cream Place 1 Applicatorful vaginally daily as needed (irritation).    nystatin -triamcinolone  ointment (MYCOLOG) Apply 1 Application topically 2 (two) times daily.    ondansetron  (ZOFRAN ) 4 MG tablet Take 1 tablet (4 mg total) by mouth every 6  (six) hours as needed for nausea.    oxyCODONE  (ROXICODONE ) 5 MG immediate release tablet Take 0.5 tablets (2.5 mg total) by mouth every 8 (eight) hours as needed for breakthrough pain.    rosuvastatin  (CRESTOR ) 5 MG tablet Take 1 tablet (5 mg total) by mouth daily. (Patient taking differently: Take 5 mg by mouth every evening.)    traMADol  (ULTRAM ) 50 MG tablet Take 1 tablet (50 mg total) by mouth every 6 (six) hours as needed for moderate pain (pain score 4-6).    valACYclovir  (VALTREX ) 1000 MG tablet TAKE 1 TABLET BY MOUTH DAILY. CAN TAKE 1 TABLET BY MOUTH TWICE DAILY IF FLARE UP FOR 7 DAYS    enoxaparin  (LOVENOX ) 40 MG/0.4ML injection Inject 0.4 mLs (40 mg total) into the skin daily for 14 days.    metoprolol  tartrate (LOPRESSOR ) 25 MG tablet Take 1 tablet (25 mg total) by mouth 2 (two) times daily.    nitroGLYCERIN  (NITROSTAT ) 0.4 MG SL tablet Place 1 tablet (0.4 mg total) under the tongue every 5 (five) minutes x 3 doses as needed for chest pain. 01/14/2024: prn   No facility-administered encounter medications on file as of 02/15/2024.      Medical History: Past Medical History:  Diagnosis Date   Allergic rhinitis    Aortic atherosclerosis (HCC)    Arthritis    Bifascicular block (RBBB + LAFB)    CAD (coronary  artery disease) 06/06/2021   a.) STEMI --> LHC: EF 55-65%, 100% RI --> PCI performed placing a 2.5 x 18 mm Onyx Frontier DES   Cellulitis and abscess    Diastolic dysfunction 06/07/2021   a.) TTE 06/07/2021: EF 55-60%, no RWMAs, triv MR, mild AoV sclerosis with no stenosis, G1DD; b.) TTE 07/17/2021: EF >55%, mild LVH, no RWMAs; triv TR, mild MR, G1DD   Diverticulosis    Enlarged heart    Eyelid inflammation    Frequent nosebleeds    GERD (gastroesophageal reflux disease)    Hematuria syndrome    Hemorrhoids    Herpes genitalis    Herpes simplex    History of bilateral cataract extraction    History of colonic polyps    History of methicillin resistant staphylococcus  aureus (MRSA) 2021   HTN (hypertension)    Hydronephrosis    Hyperlipidemia    Hypothyroidism    Incomplete bladder emptying    Internal hordeolum    Long term current use of antithrombotics/antiplatelets    a.) on daily DAPT therapy (ASA + ticagrelor ) x 1 year s/p STEMI; ticagrelor  has been discontinued   OSA on CPAP    PONV (postoperative nausea and vomiting)    Primary ovarian failure    Sciatica    Shortness of breath    Sinus bradycardia    STEMI (ST elevation myocardial infarction) (HCC) 06/06/2021   a.) LHC/PCI 06/06/2021 --> 100% RI (2.5 x 18 mm Onyx Frontier DES)   Urethral diverticulum    Vaginal atrophy    Vitamin B12 deficiency    Vitamin D  deficiency      Vital Signs: BP 129/62   Pulse 72   Resp 16   Ht 4\' 9"  (1.448 m)   Wt 159 lb (72.1 kg)   SpO2 97%   BMI 34.41 kg/m    Review of Systems  Constitutional:  Negative for fatigue and fever.  HENT:  Negative for congestion, mouth sores and postnasal drip.   Respiratory:  Negative for cough.   Cardiovascular:  Negative for chest pain.  Genitourinary:  Positive for dysuria, frequency, pelvic pain and urgency. Negative for flank pain.  Psychiatric/Behavioral: Negative.      Physical Exam Vitals and nursing note reviewed.  Constitutional:      General: She is not in acute distress.    Appearance: Normal appearance. She is obese. She is not ill-appearing.  HENT:     Head: Normocephalic and atraumatic.  Cardiovascular:     Rate and Rhythm: Normal rate and regular rhythm.  Pulmonary:     Effort: Pulmonary effort is normal. No respiratory distress.     Breath sounds: Normal breath sounds.  Abdominal:     Tenderness: There is no abdominal tenderness. There is no right CVA tenderness or left CVA tenderness.  Neurological:     Mental Status: She is alert.     Gait: Gait abnormal.  Psychiatric:        Mood and Affect: Mood normal.        Behavior: Behavior normal.       Assessment/Plan: 1. Urinary  tract infection without hematuria, site unspecified (Primary) Will start cipro  and adjust based on C/S. Continue to drink plenty of fluids - ciprofloxacin  (CIPRO ) 500 MG tablet; Take 1 tablet (500 mg total) by mouth 2 (two) times daily for 7 days.  Dispense: 14 tablet; Refill: 0 - CULTURE, URINE COMPREHENSIVE  2. Dysuria - POCT Urinalysis Dipstick    General Counseling: Shelagh Derrick verbalizes understanding  of the findings of todays visit and agrees with plan of treatment. I have discussed any further diagnostic evaluation that may be needed or ordered today. We also reviewed her medications today. she has been encouraged to call the office with any questions or concerns that should arise related to todays visit.    Counseling:    Orders Placed This Encounter  Procedures   CULTURE, URINE COMPREHENSIVE   POCT Urinalysis Dipstick    Meds ordered this encounter  Medications   ciprofloxacin  (CIPRO ) 500 MG tablet    Sig: Take 1 tablet (500 mg total) by mouth 2 (two) times daily for 7 days.    Dispense:  14 tablet    Refill:  0    Time spent:25 Minutes

## 2024-02-20 LAB — CULTURE, URINE COMPREHENSIVE

## 2024-02-22 ENCOUNTER — Ambulatory Visit: Payer: Self-pay | Admitting: Physician Assistant

## 2024-02-23 NOTE — Telephone Encounter (Signed)
 Pt notified  for Urine culture result

## 2024-02-23 NOTE — Telephone Encounter (Signed)
-----   Message from Jacques Mattock sent at 02/22/2024  3:24 PM EDT ----- Please let her know that her urine culture did confirm UTI and she is on appropriate ABX already

## 2024-04-25 ENCOUNTER — Other Ambulatory Visit: Payer: Self-pay | Admitting: Nurse Practitioner

## 2024-04-25 DIAGNOSIS — I10 Essential (primary) hypertension: Secondary | ICD-10-CM

## 2024-05-20 ENCOUNTER — Emergency Department

## 2024-05-20 ENCOUNTER — Other Ambulatory Visit: Payer: Self-pay

## 2024-05-20 DIAGNOSIS — S61412A Laceration without foreign body of left hand, initial encounter: Secondary | ICD-10-CM | POA: Diagnosis present

## 2024-05-20 DIAGNOSIS — Y92000 Kitchen of unspecified non-institutional (private) residence as  the place of occurrence of the external cause: Secondary | ICD-10-CM | POA: Insufficient documentation

## 2024-05-20 DIAGNOSIS — I10 Essential (primary) hypertension: Secondary | ICD-10-CM | POA: Diagnosis not present

## 2024-05-20 DIAGNOSIS — W01118A Fall on same level from slipping, tripping and stumbling with subsequent striking against other sharp object, initial encounter: Secondary | ICD-10-CM | POA: Diagnosis not present

## 2024-05-20 DIAGNOSIS — Y9301 Activity, walking, marching and hiking: Secondary | ICD-10-CM | POA: Diagnosis not present

## 2024-05-20 NOTE — ED Triage Notes (Signed)
 Pt in via POV from home, reports laceration to palm of left hand after falling and hand landing on kitchen cabinet handle, reports the metal handle stuck through her hand and into her wrist, swelling and bruising noted to wrist as well as puncture wound to palm

## 2024-05-21 ENCOUNTER — Emergency Department: Admission: EM | Admit: 2024-05-21 | Discharge: 2024-05-21 | Disposition: A | Discharge status: Home / Self Care

## 2024-05-21 ENCOUNTER — Telehealth: Payer: Self-pay | Admitting: Emergency Medicine

## 2024-05-21 ENCOUNTER — Emergency Department

## 2024-05-21 DIAGNOSIS — S61412A Laceration without foreign body of left hand, initial encounter: Secondary | ICD-10-CM

## 2024-05-21 MED ORDER — ACETAMINOPHEN 500 MG PO TABS
1000.0000 mg | ORAL_TABLET | Freq: Once | ORAL | Status: AC
  Administered 2024-05-21: 1000 mg via ORAL
  Filled 2024-05-21: qty 2

## 2024-05-21 MED ORDER — OXYCODONE HCL 5 MG PO TABS: 5.0000 mg | ORAL_TABLET | Freq: Three times a day (TID) | ORAL | 0 refills | Status: AC | PRN

## 2024-05-21 MED ORDER — LIDOCAINE-EPINEPHRINE (PF) 2 %-1:200000 IJ SOLN
10.0000 mL | Freq: Once | INTRAMUSCULAR | Status: AC
  Administered 2024-05-21: 10 mL
  Filled 2024-05-21: qty 20

## 2024-05-21 MED ORDER — CEPHALEXIN 500 MG PO CAPS: 500.0000 mg | ORAL_CAPSULE | Freq: Two times a day (BID) | ORAL | 0 refills | Status: DC

## 2024-05-21 MED ORDER — CEPHALEXIN 500 MG PO CAPS: 500.0000 mg | ORAL_CAPSULE | Freq: Two times a day (BID) | ORAL | 0 refills | Status: AC

## 2024-05-21 MED ORDER — OXYCODONE HCL 5 MG PO TABS
5.0000 mg | ORAL_TABLET | Freq: Once | ORAL | Status: AC
  Administered 2024-05-21: 5 mg via ORAL
  Filled 2024-05-21: qty 1

## 2024-05-21 MED ORDER — OXYCODONE HCL 5 MG PO TABS
5.0000 mg | ORAL_TABLET | Freq: Three times a day (TID) | ORAL | 0 refills | Status: DC | PRN
Start: 2024-05-21 — End: 2024-05-21

## 2024-05-21 MED ORDER — ACETAMINOPHEN 500 MG PO TABS
1000.0000 mg | ORAL_TABLET | Freq: Four times a day (QID) | ORAL | 2 refills | Status: AC | PRN
Start: 2024-05-21 — End: 2025-05-21

## 2024-05-21 MED ORDER — BACITRACIN ZINC 500 UNIT/GM EX OINT
TOPICAL_OINTMENT | Freq: Once | CUTANEOUS | Status: AC
  Filled 2024-05-21: qty 0.9

## 2024-05-21 NOTE — ED Notes (Signed)
 Wound dressed. Antibiotic applied. Pt requesting a sling. Sling applied.

## 2024-05-21 NOTE — Telephone Encounter (Signed)
 Patient called, her prescription is needed urgently and was accidentally sent to her mail order pharmacy.  I have changed her prescriptions to [facility] in [place] at her request.  Nursing secretary, Lyndy Ada, calling patient's pharmacy in [place]  to request discontinuation of the 3 medication she was prescribed last night

## 2024-05-21 NOTE — ED Provider Notes (Signed)
 [facility] Provider Note    Event Date/Time   First MD Initiated Contact with Patient 05/21/24 [phone]     (approximate)   History   Laceration  Pt in via POV from home, reports laceration to palm of left hand after falling and hand landing on kitchen cabinet handle, reports the metal handle stuck through her hand and into her wrist, swelling and bruising noted to wrist as well as puncture wound to palm   HPI Claudia Martin is a 78 y.o. female PMH hypertension, prior STEMI presents for evaluation of a left hand laceration after a fall - Patient was turning while walking in her kitchen, lost her balance and fell catching her left hand on a cabinet handle.  Notes that her hand got impaled on the handle, was able to retracted and came to emergency department for eval.  Right-hand-dominant. - No head strike, complains only of left hand pain and left shoulder pain after fall - Otherwise has been in her usual state of health with no recent infectious symptoms      Physical Exam   Triage Vital Signs: ED Triage Vitals  Encounter Vitals Group     BP 05/20/24 2253 (!) 172/99     Girls Systolic BP Percentile --      Girls Diastolic BP Percentile --      Boys Systolic BP Percentile --      Boys Diastolic BP Percentile --      Pulse Rate 05/20/24 2253 92     Resp 05/20/24 2253 19     Temp 05/20/24 2253 98.3 F (36.8 C)     Temp Source 05/20/24 2253 Oral     SpO2 05/20/24 2253 98 %     Weight 05/20/24 2254 166 lb (75.3 kg)     Height 05/20/24 2254 4' 9 (1.448 m)     Head Circumference --      Peak Flow --      Pain Score 05/20/24 2254 7     Pain Loc --      Pain Education --      Exclude from Growth Chart --     Most recent vital signs: Vitals:   05/20/24 2253 05/21/24 0207  BP: (!) 172/99 (!) 166/98  Pulse: 92 86  Resp: 19 19  Temp: 98.3 F (36.8 C) 98 F (36.7 C)  SpO2: 98% 100%     General: Awake, no distress.   HEENT: Normocephalic, atraumatic CV:  Good peripheral perfusion. RRR, RP 2+ Resp:  Normal effort. CTAB LUE:  1.5cm laceration to palm of hand over 2nd metacarpal.  Able to flex and extend all digits at DIP and PIP though not able to fully flex the thumb.  Compressible throughout though somewhat firm over thenar eminence.  RP 2+.  No foreign bodies appreciated.  No pulsatile bleeding.   ED Results / Procedures / Treatments   Labs (all labs ordered are listed, but only abnormal results are displayed) Labs Reviewed - No data to display   EKG  N/a   RADIOLOGY Radiology interpreted myself and radiology report reviewed.  No fractures or foreign bodies identified.    PROCEDURES:  Critical Care performed: No  .Laceration Repair  Date/Time: 05/21/2024 7:22 AM  Performed by: Clarine Ozell LABOR, MD Authorized by: Clarine Ozell LABOR, MD   Consent:    Consent obtained:  Verbal   Consent given by:  Patient   Risks, benefits, and alternatives were discussed: yes  Risks discussed:  Infection, need for additional repair, nerve damage, pain, poor cosmetic result, poor wound healing, retained foreign body, tendon damage and vascular damage   Alternatives discussed:  No treatment Universal protocol:    Procedure explained and questions answered to patient or proxy's satisfaction: yes     Relevant documents present and verified: yes     Test results available: yes     Imaging studies available: yes     Required blood products, implants, devices, and special equipment available: yes     Site/side marked: yes     Immediately prior to procedure, a time out was called: yes     Patient identity confirmed:  Verbally with patient and arm band Anesthesia:    Anesthesia method:  Local infiltration   Local anesthetic:  Lidocaine  2% WITH epi Laceration details:    Location:  Hand   Hand location:  L palm   Length (cm):  1.5 Pre-procedure details:    Preparation:  Patient was prepped and draped  in usual sterile fashion and imaging obtained to evaluate for foreign bodies Exploration:    Limited defect created (wound extended): no     Hemostasis achieved with:  Direct pressure   Imaging obtained: x-ray     Imaging outcome: foreign body not noted     Wound exploration: wound explored through full range of motion and entire depth of wound visualized     Wound extent: areolar tissue not violated, fascia not violated, no foreign body, no signs of injury, no tendon damage, no underlying fracture and no vascular damage     Contaminated: no   Treatment:    Area cleansed with:  Saline   Amount of cleaning:  Extensive   Irrigation solution:  Sterile saline   Irrigation volume:  500   Irrigation method:  Pressure wash   Visualized foreign bodies/material removed: no     Debridement:  Minimal Skin repair:    Repair method:  Sutures   Suture size:  5-0   Suture material:  Nylon   Suture technique:  Simple interrupted   Number of sutures:  2 Approximation:    Approximation:  Loose Repair type:    Repair type:  Simple Post-procedure details:    Dressing:  Antibiotic ointment   Procedure completion:  Tolerated    MEDICATIONS ORDERED IN ED: Medications  bacitracin  ointment (has no administration in time range)  oxyCODONE  (Oxy IR/ROXICODONE ) immediate release tablet 5 mg (5 mg Oral Given 05/21/24 0512)  acetaminophen  (TYLENOL ) tablet 1,000 mg (1,000 mg Oral Given 05/21/24 0512)  lidocaine -EPINEPHrine  (XYLOCAINE  W/EPI) 2 %-1:200000 (PF) injection 10 mL (10 mLs Infiltration Given 05/21/24 0512)     IMPRESSION / MDM / ASSESSMENT AND PLAN / ED COURSE  I reviewed the triage vital signs and the nursing notes.                              DDX/MDM/AP: Differential diagnosis includes, but is not limited to, laceration, consider underlying fracture, doubt retained foreign body.  Consider possibility of tendinous injury.  Patient does not seem to have significant pain in her left shoulder as  well on exam, will screen to ensure no dislocation or fracture.  Plan: - Pain control - X-ray left wrist/hand, x-ray left shoulder - will re-attempt exam after pain control - anticipate need for lac repair   Patient's presentation is most consistent with acute presentation with potential threat to life or bodily  function.   ED course below.  X-rays unremarkable.  On reevaluation after pain control, patient does have good flexion/extension of all digits at DIP/PIP including her thumb --not clinically concern for tendinous injury at this time.  Does have mild persistent tingling over the thumb though sensation does remain intact.  No expansion of hematoma over several hours that she was here in emergency department.  Irrigated extensively, no foreign bodies appreciated, no pulsatile bleeding.  Closed loosely given penetrating nature with only 2 sutures, nonabsorbable, plan for removal in about 7 days.  Patient is already established with an orthopedist--recommend she call early Monday morning to arrange outpatient follow-up with her orthopedist as well as her primary care provider.  Started on short course of Keflex  to help prevent infection.  Clinical Course as of 05/21/24 0729  Sat May 21, 2024  0433 XR left wrist reviewed, unremarkable on my interpretation, radiology report below  IMPRESSION: No fracture or radiopaque foreign body.   [MM]  0545 XR L shoulder: IMPRESSION: 1. No acute fracture or dislocation.   [MM]    Clinical Course User Index [MM] Clarine Ozell LABOR, MD     FINAL CLINICAL IMPRESSION(S) / ED DIAGNOSES   Final diagnoses:  Laceration of left hand, foreign body presence unspecified, initial encounter     Rx / DC Orders   ED Discharge Orders          Ordered    cephALEXin  (KEFLEX ) 500 MG capsule  2 times daily        05/21/24 0718    acetaminophen  (TYLENOL ) 500 MG tablet  Every 6 hours PRN        05/21/24 0718    oxyCODONE  (ROXICODONE ) 5 MG immediate  release tablet  Every 8 hours PRN        05/21/24 9281             Note:  This document was prepared using Dragon voice recognition software and may include unintentional dictation errors.   Clarine Ozell LABOR, MD 05/21/24 [phone]

## 2024-05-21 NOTE — Discharge Instructions (Signed)
 Your evaluation in the emergency department was overall reassuring.  X-rays of your hands, wrist, and shoulder showed no fractures, and the laceration to your hand was closed with 2 sutures--they should be removed in about 1 week.  I do recommend that you follow-up with your orthopedic doctor for reevaluation of your hand-please call their office Monday morning to schedule this.  I prescribed you a short course of antibiotics to prevent infection as well as pain medications to use as needed.  Return to the emergency department with any new or worsening symptoms.

## 2024-06-09 ENCOUNTER — Encounter: Payer: Self-pay | Admitting: Nurse Practitioner

## 2024-06-09 ENCOUNTER — Ambulatory Visit: Admitting: Nurse Practitioner

## 2024-06-09 VITALS — BP 135/70 | HR 72 | Temp 98.1°F | Resp 16 | Ht <= 58 in | Wt 162.6 lb

## 2024-06-09 DIAGNOSIS — I10 Essential (primary) hypertension: Secondary | ICD-10-CM

## 2024-06-09 DIAGNOSIS — E039 Hypothyroidism, unspecified: Secondary | ICD-10-CM

## 2024-06-09 DIAGNOSIS — R21 Rash and other nonspecific skin eruption: Secondary | ICD-10-CM | POA: Diagnosis not present

## 2024-06-09 DIAGNOSIS — E782 Mixed hyperlipidemia: Secondary | ICD-10-CM

## 2024-06-09 MED ORDER — LEVOTHYROXINE SODIUM 50 MCG PO TABS: ORAL_TABLET | ORAL | 1 refills | Status: AC

## 2024-06-09 MED ORDER — ROSUVASTATIN CALCIUM 5 MG PO TABS: 5.0000 mg | ORAL_TABLET | Freq: Every evening | ORAL | 1 refills | Status: DC

## 2024-06-09 MED ORDER — NYSTATIN-TRIAMCINOLONE 100000-0.1 UNIT/GM-% EX OINT: 1.0000 | TOPICAL_OINTMENT | Freq: Two times a day (BID) | CUTANEOUS | 0 refills | Status: AC

## 2024-06-09 MED ORDER — METOPROLOL TARTRATE 25 MG PO TABS: 25.0000 mg | ORAL_TABLET | Freq: Two times a day (BID) | ORAL | 1 refills | Status: DC

## 2024-06-09 NOTE — Progress Notes (Signed)
 [facility] [address] [place], [place] [address]  Internal MEDICINE  Office Visit Note  Patient Name: Claudia Martin  [id number]  [id number]  Date of Service: 06/09/2024  Chief Complaint  Patient presents with   Gastroesophageal Reflux   Hypertension   Hyperlipidemia   Follow-up    HPI Claudia Martin presents for a follow-up visit for hypertension, hypothyroidism, and high cholesterol.  Hypertension -- controlled with losartan  and metoprolol .  Hypothyroidism -- taking levothyroxine  daily High cholesterol -- taking rosuvastatin  daily.  Yeast rash in lower abdominal area.     Current Medication: Outpatient Encounter Medications as of 06/09/2024  Medication Sig Note   acetaminophen  (TYLENOL ) 500 MG tablet Take 2 tablets (1,000 mg total) by mouth every 8 (eight) hours.    acetaminophen  (TYLENOL ) 500 MG tablet Take 2 tablets (1,000 mg total) by mouth every 6 (six) hours as needed.    aspirin  81 MG chewable tablet Chew 1 tablet (81 mg total) by mouth daily.    cetirizine (ZYRTEC) 10 MG tablet Take 10 mg by mouth every morning.    chlorhexidine  (PERIDEX ) 0.12 % solution USE 5 ML AS DIRECTED IN THE MOUTH OR THROAT TWICE DAILY 01/14/2024: prn   docusate sodium  (COLACE) 100 MG capsule Take 1 capsule (100 mg total) by mouth 2 (two) times daily.    EPINEPHrine  0.3 mg/0.3 mL IJ SOAJ injection Inject 0.3 mg into the muscle as needed for anaphylaxis. 01/14/2024: prn   hydrOXYzine  (VISTARIL ) 25 MG capsule Take 1 capsule (25 mg total) by mouth every 8 (eight) hours as needed.    levothyroxine  (SYNTHROID ) 50 MCG tablet TAKE 1 TABLET(50 MCG) BY MOUTH DAILY BEFORE BREAKFAST    losartan  (COZAAR ) 25 MG tablet TAKE 1 TABLET(25 MG) BY MOUTH TWICE DAILY    metoprolol  tartrate (LOPRESSOR ) 25 MG tablet Take 1 tablet (25 mg total) by mouth 2 (two) times daily.    miconazole (MONISTAT 7) 2 % vaginal cream Place 1 Applicatorful vaginally daily as needed (irritation).    nitroGLYCERIN   (NITROSTAT ) 0.4 MG SL tablet Place 1 tablet (0.4 mg total) under the tongue every 5 (five) minutes x 3 doses as needed for chest pain. 01/14/2024: prn   nystatin -triamcinolone  ointment (MYCOLOG) Apply 1 Application topically 2 (two) times daily. To rash in lower abdomen until resolved.    rosuvastatin  (CRESTOR ) 5 MG tablet Take 1 tablet (5 mg total) by mouth every evening.    valACYclovir  (VALTREX ) 1000 MG tablet TAKE 1 TABLET BY MOUTH DAILY. CAN TAKE 1 TABLET BY MOUTH TWICE DAILY IF FLARE UP FOR 7 DAYS    [DISCONTINUED] enoxaparin  (LOVENOX ) 40 MG/0.4ML injection Inject 0.4 mLs (40 mg total) into the skin daily for 14 days.    [DISCONTINUED] levothyroxine  (SYNTHROID ) 50 MCG tablet TAKE 1 TABLET(50 MCG) BY MOUTH DAILY BEFORE BREAKFAST    [DISCONTINUED] metoprolol  tartrate (LOPRESSOR ) 25 MG tablet Take 1 tablet (25 mg total) by mouth 2 (two) times daily.    [DISCONTINUED] nystatin -triamcinolone  ointment (MYCOLOG) Apply 1 Application topically 2 (two) times daily.    [DISCONTINUED] ondansetron  (ZOFRAN ) 4 MG tablet Take 1 tablet (4 mg total) by mouth every 6 (six) hours as needed for nausea.    [DISCONTINUED] rosuvastatin  (CRESTOR ) 5 MG tablet Take 1 tablet (5 mg total) by mouth daily. (Patient taking differently: Take 5 mg by mouth every evening.)    [DISCONTINUED] traMADol  (ULTRAM ) 50 MG tablet Take 1 tablet (50 mg total) by mouth every 6 (six) hours as needed for moderate pain (pain score 4-6).  No facility-administered encounter medications on file as of 06/09/2024.    Surgical History: Past Surgical History:  Procedure Laterality Date   ABDOMINAL WALL DEFECT REPAIR N/A 05/27/2022   Procedure: REPAIR ABDOMINAL WALL, reconstruction;  Surgeon: Jordis Laneta FALCON, MD;  Location: [facility];  Service: General;  Laterality: N/A;   BREAST BIOPSY Bilateral    benign   CATARACT EXTRACTION W/PHACO Right 06/05/2021   Procedure: CATARACT EXTRACTION PHACO AND INTRAOCULAR LENS PLACEMENT (IOC) RIGHT;  Surgeon:  Mittie Gaskin, MD;  Location: [facility];  Service: Ophthalmology;  Laterality: Right;  3.02 00:39.5   CATARACT EXTRACTION W/PHACO Left 12/04/2021   Procedure: CATARACT EXTRACTION PHACO AND INTRAOCULAR LENS PLACEMENT (IOC) LEFT;  Surgeon: Mittie Gaskin, MD;  Location: [facility];  Service: Ophthalmology;  Laterality: Left;  6.23 00:48.0   COLON SURGERY     Diverticulitis   COLONOSCOPY WITH PROPOFOL  N/A 03/15/2018   Procedure: COLONOSCOPY WITH PROPOFOL ;  Surgeon: Unk Corinn Skiff, MD;  Location: [facility];  Service: Gastroenterology;  Laterality: N/A;   COLONOSCOPY WITH PROPOFOL  N/A 01/09/2021   Procedure: COLONOSCOPY WITH PROPOFOL ;  Surgeon: Unk Corinn Skiff, MD;  Location: [facility];  Service: Gastroenterology;  Laterality: N/A;   CORONARY/GRAFT ACUTE MI REVASCULARIZATION N/A 06/06/2021   Procedure: Coronary/Graft Acute MI Revascularization;  Surgeon: Florencio Cara BIRCH, MD;  Location: [facility];  Service: Cardiovascular;  Laterality: N/A;   ECTOPIC PREGNANCY SURGERY     fx thumb     HERNIA REPAIR     INSERTION OF MESH N/A 05/27/2022   Procedure: INSERTION OF MESH;  Surgeon: Jordis Laneta FALCON, MD;  Location: [facility];  Service: General;  Laterality: N/A;   LEFT HEART CATH AND CORONARY ANGIOGRAPHY N/A 06/06/2021   Procedure: LEFT HEART CATH AND CORONARY ANGIOGRAPHY;  Surgeon: Florencio Cara BIRCH, MD;  Location: [facility];  Service: Cardiovascular;  Laterality: N/A;   TOTAL KNEE ARTHROPLASTY Left 01/26/2023   Procedure: TOTAL KNEE ARTHROPLASTY;  Surgeon: Lorelle Hussar, MD;  Location: [facility];  Service: Orthopedics;  Laterality: Left;   TOTAL KNEE ARTHROPLASTY Right 01/14/2024   Procedure: ARTHROPLASTY, KNEE, TOTAL;  Surgeon: Lorelle Hussar, MD;  Location: [facility];  Service: Orthopedics;  Laterality: Right;   VENTRAL HERNIA REPAIR N/A 05/27/2022   Procedure: HERNIA REPAIR VENTRAL ADULT, recurrent;  Surgeon: Jordis Laneta FALCON,  MD;  Location: [facility];  Service: General;  Laterality: N/A;    Medical History: Past Medical History:  Diagnosis Date   Allergic rhinitis    Aortic atherosclerosis (HCC)    Arthritis    Bifascicular block (RBBB + LAFB)    CAD (coronary artery disease) 06/06/2021   a.) STEMI --> LHC: EF 55-65%, 100% RI --> PCI performed placing a 2.5 x 18 mm Onyx Frontier DES   Cellulitis and abscess    Diastolic dysfunction 06/07/2021   a.) TTE 06/07/2021: EF 55-60%, no RWMAs, triv MR, mild AoV sclerosis with no stenosis, G1DD; b.) TTE 07/17/2021: EF >55%, mild LVH, no RWMAs; triv TR, mild MR, G1DD   Diverticulosis    Enlarged heart    Eyelid inflammation    Frequent nosebleeds    GERD (gastroesophageal reflux disease)    Hematuria syndrome    Hemorrhoids    Herpes genitalis    Herpes simplex    History of bilateral cataract extraction    History of colonic polyps    History of methicillin resistant staphylococcus aureus (MRSA) 2021   HTN (hypertension)    Hydronephrosis    Hyperlipidemia  Hypothyroidism    Incomplete bladder emptying    Internal hordeolum    Long term current use of antithrombotics/antiplatelets    a.) on daily DAPT therapy (ASA + ticagrelor ) x 1 year s/p STEMI; ticagrelor  has been discontinued   OSA on CPAP    PONV (postoperative nausea and vomiting)    Primary ovarian failure    Sciatica    Shortness of breath    Sinus bradycardia    STEMI (ST elevation myocardial infarction) (HCC) 06/06/2021   a.) LHC/PCI 06/06/2021 --> 100% RI (2.5 x 18 mm Onyx Frontier DES)   Urethral diverticulum    Vaginal atrophy    Vitamin B12 deficiency    Vitamin D  deficiency     Family History: Family History  Problem Relation Age of Onset   Tuberculosis Mother    Nephrolithiasis Brother    Vision loss Daughter    Hypertension Daughter     Social History   Socioeconomic History   Marital status: Divorced    Spouse name: Not on file   Number of children: Not on file    Years of education: Not on file   Highest education level: Not on file  Occupational History   Not on file  Tobacco Use   Smoking status: Never    Passive exposure: Never   Smokeless tobacco: Never  Vaping Use   Vaping status: Never Used  Substance and Sexual Activity   Alcohol use: No   Drug use: Never   Sexual activity: Not on file  Other Topics Concern   Not on file  Social History Narrative   Not on file   Social Drivers of Health   Financial Resource Strain: Low Risk  (05/24/2024)   Received from [facility]   Overall Financial Resource Strain (CARDIA)    Difficulty of Paying Living Expenses: Not hard at all  Food Insecurity: No Food Insecurity (05/24/2024)   Received from [facility]   Hunger Vital Sign    Within the past 12 months, you worried that your food would run out before you got the money to buy more.: Never true    Within the past 12 months, the food you bought just didn't last and you didn't have money to get more.: Never true  Transportation Needs: No Transportation Needs (05/24/2024)   Received from [facility] - Transportation    In the past 12 months, has lack of transportation kept you from medical appointments or from getting medications?: No    Lack of Transportation (Non-Medical): No  Physical Activity: Not on file  Stress: Not on file  Social Connections: Socially Isolated (01/14/2024)   Social Connection and Isolation Panel    Frequency of Communication with Friends and Family: Once a week    Frequency of Social Gatherings with Friends and Family: Once a week    Attends Religious Services: Never    Database administrator or Organizations: No    Attends Banker Meetings: Never    Marital Status: Divorced  Catering manager Violence: Not At Risk (01/14/2024)   Humiliation, Afraid, Rape, and Kick questionnaire    Fear of Current or Ex-Partner: No    Emotionally Abused: No     Physically Abused: No    Sexually Abused: No      Review of Systems  Constitutional:  Negative for chills, fatigue and unexpected weight change.  HENT:  Negative for congestion, rhinorrhea, sneezing and sore throat.  Eyes:  Negative for redness.  Respiratory: Negative.  Negative for cough, chest tightness, shortness of breath and wheezing.   Cardiovascular: Negative.  Negative for chest pain and palpitations.  Gastrointestinal:  Negative for abdominal pain, constipation, diarrhea, nausea and vomiting.  Genitourinary:  Negative for dysuria and frequency.  Musculoskeletal:  Negative for arthralgias, back pain, joint swelling and neck pain.  Skin:  Negative for rash.  Neurological: Negative.  Negative for tremors and numbness.  Hematological:  Negative for adenopathy. Does not bruise/bleed easily.  Psychiatric/Behavioral:  Negative for behavioral problems (Depression), sleep disturbance and suicidal ideas. The patient is not nervous/anxious.     Vital Signs: BP (!) 142/74   Pulse 72   Temp 98.1 F (36.7 C)   Resp 16   Ht 4' 9 (1.448 m)   Wt 162 lb 9.6 oz (73.8 kg)   SpO2 95%   BMI 35.19 kg/m    Physical Exam Vitals reviewed.  Constitutional:      General: She is not in acute distress.    Appearance: Normal appearance. She is obese. She is not ill-appearing.  HENT:     Head: Normocephalic and atraumatic.  Eyes:     Pupils: Pupils are equal, round, and reactive to light.  Cardiovascular:     Rate and Rhythm: Normal rate and regular rhythm.  Pulmonary:     Effort: Pulmonary effort is normal. No respiratory distress.  Neurological:     Mental Status: She is alert and oriented to person, place, and time.  Psychiatric:        Mood and Affect: Mood normal.        Behavior: Behavior normal.        Assessment/Plan: 1. Essential hypertension (Primary) Stable, continue metoprolol  as prescribed.  - metoprolol  tartrate (LOPRESSOR ) 25 MG tablet; Take 1 tablet (25 mg  total) by mouth 2 (two) times daily.  Dispense: 180 tablet; Refill: 1  2. Acquired hypothyroidism Continue levothyroxine  as prescribed.  - levothyroxine  (SYNTHROID ) 50 MCG tablet; TAKE 1 TABLET(50 MCG) BY MOUTH DAILY BEFORE BREAKFAST  Dispense: 90 tablet; Refill: 1  3. Mixed hyperlipidemia Continue rosuvastatin  as prescribed.  - rosuvastatin  (CRESTOR ) 5 MG tablet; Take 1 tablet (5 mg total) by mouth every evening.  Dispense: 90 tablet; Refill: 1  4. Rash and other nonspecific skin eruption Topical treatment prescribed as needed. - nystatin -triamcinolone  ointment (MYCOLOG); Apply 1 Application topically 2 (two) times daily. To rash in lower abdomen until resolved.  Dispense: 30 g; Refill: 0   General Counseling: Claudia Martin verbalizes understanding of the findings of todays visit and agrees with plan of treatment. I have discussed any further diagnostic evaluation that may be needed or ordered today. We also reviewed her medications today. she has been encouraged to call the office with any questions or concerns that should arise related to todays visit.    No orders of the defined types were placed in this encounter.   Meds ordered this encounter  Medications   levothyroxine  (SYNTHROID ) 50 MCG tablet    Sig: TAKE 1 TABLET(50 MCG) BY MOUTH DAILY BEFORE BREAKFAST    Dispense:  90 tablet    Refill:  1   metoprolol  tartrate (LOPRESSOR ) 25 MG tablet    Sig: Take 1 tablet (25 mg total) by mouth 2 (two) times daily.    Dispense:  180 tablet    Refill:  1   rosuvastatin  (CRESTOR ) 5 MG tablet    Sig: Take 1 tablet (5 mg total) by mouth every evening.  Dispense:  90 tablet    Refill:  1   nystatin -triamcinolone  ointment (MYCOLOG)    Sig: Apply 1 Application topically 2 (two) times daily. To rash in lower abdomen until resolved.    Dispense:  30 g    Refill:  0    Return for previously scheduled, AWV, Claudia Martin PCP in march next year.   Total time spent:30 Minutes Time spent includes  review of chart, medications, test results, and follow up plan with the patient.   [place] Controlled Substance Database was reviewed by me.  This patient was seen by Claudia Maxin, FNP-C in collaboration with Dr. Sigrid Martin as a part of collaborative care agreement.   Claudia Laski R. Maxin, MSN, FNP-C Internal medicine

## 2024-06-13 ENCOUNTER — Encounter: Payer: Self-pay | Admitting: Nurse Practitioner

## 2024-06-14 ENCOUNTER — Encounter: Payer: Self-pay | Admitting: Internal Medicine

## 2024-06-14 ENCOUNTER — Ambulatory Visit (INDEPENDENT_AMBULATORY_CARE_PROVIDER_SITE_OTHER): Admitting: Internal Medicine

## 2024-06-14 VITALS — BP 151/73 | HR 65 | Temp 98.0°F | Resp 16 | Ht <= 58 in | Wt 163.0 lb

## 2024-06-14 DIAGNOSIS — G4733 Obstructive sleep apnea (adult) (pediatric): Secondary | ICD-10-CM | POA: Diagnosis not present

## 2024-06-14 NOTE — Progress Notes (Signed)
 [facility] [address] [place], [place] [address]  Pulmonary Sleep Medicine   Office Visit Note  Patient Name: Claudia Martin DOB: Jan 09, 1946 MRN [id number]  Date of Service: 06/14/2024  Complaints/HPI: She has been doing well with the PAP therapy. She states she is using it nightly and for about 8h. Her compliance is 97% on the last download. Patient has had no issues with complications. No nasal congestion noted. She had a fall recently and had to be seen in the ED  Office Spirometry Results:     ROS  General: (-) fever, (-) chills, (-) night sweats, (-) weakness Skin: (-) rashes, (-) itching,. Eyes: (-) visual changes, (-) redness, (-) itching. Nose and Sinuses: (-) nasal stuffiness or itchiness, (-) postnasal drip, (-) nosebleeds, (-) sinus trouble. Mouth and Throat: (-) sore throat, (-) hoarseness. Neck: (-) swollen glands, (-) enlarged thyroid , (-) neck pain. Respiratory: - cough, (-) bloody sputum, - shortness of breath, - wheezing. Cardiovascular: - ankle swelling, (-) chest pain. Lymphatic: (-) lymph node enlargement. Neurologic: (-) numbness, (-) tingling. Psychiatric: (-) anxiety, (-) depression   Current Medication: Outpatient Encounter Medications as of 06/14/2024  Medication Sig Note   acetaminophen  (TYLENOL ) 500 MG tablet Take 2 tablets (1,000 mg total) by mouth every 8 (eight) hours.    acetaminophen  (TYLENOL ) 500 MG tablet Take 2 tablets (1,000 mg total) by mouth every 6 (six) hours as needed.    aspirin  81 MG chewable tablet Chew 1 tablet (81 mg total) by mouth daily.    cetirizine (ZYRTEC) 10 MG tablet Take 10 mg by mouth every morning.    chlorhexidine  (PERIDEX ) 0.12 % solution USE 5 ML AS DIRECTED IN THE MOUTH OR THROAT TWICE DAILY 01/14/2024: prn   docusate sodium  (COLACE) 100 MG capsule Take 1 capsule (100 mg total) by mouth 2 (two) times daily.    EPINEPHrine  0.3 mg/0.3 mL IJ SOAJ injection Inject 0.3 mg into the muscle as needed  for anaphylaxis. 01/14/2024: prn   hydrOXYzine  (VISTARIL ) 25 MG capsule Take 1 capsule (25 mg total) by mouth every 8 (eight) hours as needed.    levothyroxine  (SYNTHROID ) 50 MCG tablet TAKE 1 TABLET(50 MCG) BY MOUTH DAILY BEFORE BREAKFAST    losartan  (COZAAR ) 25 MG tablet TAKE 1 TABLET(25 MG) BY MOUTH TWICE DAILY    metoprolol  tartrate (LOPRESSOR ) 25 MG tablet Take 1 tablet (25 mg total) by mouth 2 (two) times daily.    miconazole (MONISTAT 7) 2 % vaginal cream Place 1 Applicatorful vaginally daily as needed (irritation).    nitroGLYCERIN  (NITROSTAT ) 0.4 MG SL tablet Place 1 tablet (0.4 mg total) under the tongue every 5 (five) minutes x 3 doses as needed for chest pain. 01/14/2024: prn   nystatin -triamcinolone  ointment (MYCOLOG) Apply 1 Application topically 2 (two) times daily. To rash in lower abdomen until resolved.    rosuvastatin  (CRESTOR ) 5 MG tablet Take 1 tablet (5 mg total) by mouth every evening.    valACYclovir  (VALTREX ) 1000 MG tablet TAKE 1 TABLET BY MOUTH DAILY. CAN TAKE 1 TABLET BY MOUTH TWICE DAILY IF FLARE UP FOR 7 DAYS    No facility-administered encounter medications on file as of 06/14/2024.    Surgical History: Past Surgical History:  Procedure Laterality Date   ABDOMINAL WALL DEFECT REPAIR N/A 05/27/2022   Procedure: REPAIR ABDOMINAL WALL, reconstruction;  Surgeon: Jordis Laneta FALCON, MD;  Location: [facility] OR[facility];  Service: General;  Laterality: N/A;   BREAST BIOPSY Bilateral    benign   CATARACT EXTRACTION  W/PHACO Right 06/05/2021   Procedure: CATARACT EXTRACTION PHACO AND INTRAOCULAR LENS PLACEMENT (IOC) RIGHT;  Surgeon: Mittie Gaskin, MD;  Location: [facility];  Service: Ophthalmology;  Laterality: Right;  3.02 00:39.5   CATARACT EXTRACTION W/PHACO Left 12/04/2021   Procedure: CATARACT EXTRACTION PHACO AND INTRAOCULAR LENS PLACEMENT (IOC) LEFT;  Surgeon: Mittie Gaskin, MD;  Location: [facility];  Service: Ophthalmology;  Laterality: Left;   6.23 00:48.0   COLON SURGERY     Diverticulitis   COLONOSCOPY WITH PROPOFOL  N/A 03/15/2018   Procedure: COLONOSCOPY WITH PROPOFOL ;  Surgeon: Unk Corinn Skiff, MD;  Location: [facility];  Service: Gastroenterology;  Laterality: N/A;   COLONOSCOPY WITH PROPOFOL  N/A 01/09/2021   Procedure: COLONOSCOPY WITH PROPOFOL ;  Surgeon: Unk Corinn Skiff, MD;  Location: [facility] ENDOSCOPY;  Service: Gastroenterology;  Laterality: N/A;   CORONARY/GRAFT ACUTE MI REVASCULARIZATION N/A 06/06/2021   Procedure: Coronary/Graft Acute MI Revascularization;  Surgeon: Florencio Cara BIRCH, MD;  Location: [facility] INV[facility] CV LAB;  Service: Cardiovascular;  Laterality: N/A;   ECTOPIC PREGNANCY SURGERY     fx thumb     HERNIA REPAIR     INSERTION OF MESH N/A 05/27/2022   Procedure: INSERTION OF MESH;  Surgeon: Jordis Laneta FALCON, MD;  Location: [facility];  Service: General;  Laterality: N/A;   LEFT HEART CATH AND CORONARY ANGIOGRAPHY N/A 06/06/2021   Procedure: LEFT HEART CATH AND CORONARY ANGIOGRAPHY;  Surgeon: Florencio Cara BIRCH, MD;  Location: [facility] IN[facility] CV [facility];  Service: Cardiovascular;  Laterality: N/A;   TOTAL KNEE ARTHROPLASTY Left 01/26/2023   Procedure: TOTAL KNEE ARTHROPLASTY;  Surgeon: Lorelle Hussar, MD;  Location: [facility] OR[facility];  Service: Orthopedics;  Laterality: Left;   TOTAL KNEE ARTHROPLASTY Right 01/14/2024   Procedure: ARTHROPLASTY, KNEE, TOTAL;  Surgeon: Lorelle Hussar, MD;  Location: [facility] OR[facility];  Service: Orthopedics;  Laterality: Right;   VENTRAL HERNIA REPAIR N/A 05/27/2022   Procedure: HERNIA REPAIR VENTRAL ADULT, recurrent;  Surgeon: Jordis Laneta FALCON, MD;  Location: [facility] OR[facility];  Service: General;  Laterality: N/A;    Medical History: Past Medical History:  Diagnosis Date   Allergic rhinitis    Aortic atherosclerosis (HCC)    Arthritis    Bifascicular block (RBBB + LAFB)    CAD (coronary artery disease) 06/06/2021   a.) STEMI --> LHC: EF 55-65%, 100% RI --> PCI performed placing a 2.5 x 18 mm Onyx  Frontier DES   Cellulitis and abscess    Diastolic dysfunction 06/07/2021   a.) TTE 06/07/2021: EF 55-60%, no RWMAs, triv MR, mild AoV sclerosis with no stenosis, G1DD; b.) TTE 07/17/2021: EF >55%, mild LVH, no RWMAs; triv TR, mild MR, G1DD   Diverticulosis    Enlarged heart    Eyelid inflammation    Frequent nosebleeds    GERD (gastroesophageal reflux disease)    Hematuria syndrome    Hemorrhoids    Herpes genitalis    Herpes simplex    History of bilateral cataract extraction    History of colonic polyps    History of methicillin resistant staphylococcus aureus (MRSA) 2021   HTN (hypertension)    Hydronephrosis    Hyperlipidemia    Hypothyroidism    Incomplete bladder emptying    Internal hordeolum    Long term current use of antithrombotics/antiplatelets    a.) on daily DAPT therapy (ASA + ticagrelor ) x 1 year s/p STEMI; ticagrelor  has been discontinued   OSA on CPAP    PONV (postoperative nausea and vomiting)    Primary ovarian failure  Sciatica    Shortness of breath    Sinus bradycardia    STEMI (ST elevation myocardial infarction) (HCC) 06/06/2021   a.) LHC/PCI 06/06/2021 --> 100% RI (2.5 x 18 mm Onyx Frontier DES)   Urethral diverticulum    Vaginal atrophy    Vitamin B12 deficiency    Vitamin D  deficiency     Family History: Family History  Problem Relation Age of Onset   Tuberculosis Mother    Nephrolithiasis Brother    Vision loss Daughter    Hypertension Daughter     Social History: Social History   Socioeconomic History   Marital status: Divorced    Spouse name: Not on file   Number of children: Not on file   Years of education: Not on file   Highest education level: Not on file  Occupational History   Not on file  Tobacco Use   Smoking status: Never    Passive exposure: Never   Smokeless tobacco: Never  Vaping Use   Vaping status: Never Used  Substance and Sexual Activity   Alcohol use: No   Drug use: Never   Sexual activity: Not on  file  Other Topics Concern   Not on file  Social History Narrative   Not on file   Social Drivers of Health   Financial Resource Strain: Low Risk  (05/24/2024)   Received from [facility]   Overall Financial Resource Strain (CARDIA)    Difficulty of Paying Living Expenses: Not hard at all  Food Insecurity: No Food Insecurity (05/24/2024)   Received from [facility]   Hunger Vital Sign    Within the past 12 months, you worried that your food would run out before you got the money to buy more.: Never true    Within the past 12 months, the food you bought just didn't last and you didn't have money to get more.: Never true  Transportation Needs: No Transportation Needs (05/24/2024)   Received from [facility] - Transportation    In the past 12 months, has lack of transportation kept you from medical appointments or from getting medications?: No    Lack of Transportation (Non-Medical): No  Physical Activity: Not on file  Stress: Not on file  Social Connections: Socially Isolated (01/14/2024)   Social Connection and Isolation Panel    Frequency of Communication with Friends and Family: Once a week    Frequency of Social Gatherings with Friends and Family: Once a week    Attends Religious Services: Never    Database administrator or Organizations: No    Attends Banker Meetings: Never    Marital Status: Divorced  Catering manager Violence: Not At Risk (01/14/2024)   Humiliation, Afraid, Rape, and Kick questionnaire    Fear of Current or Ex-Partner: No    Emotionally Abused: No    Physically Abused: No    Sexually Abused: No    Vital Signs: Blood pressure (!) 151/73, pulse 65, temperature 98 F (36.7 C), resp. rate 16, height 4' 9 (1.448 m), weight 163 lb (73.9 kg), SpO2 98%.  Examination: General Appearance: The patient is well-developed, well-nourished, and in no distress. Skin: Gross inspection of skin  unremarkable. Head: normocephalic, no gross deformities. Eyes: no gross deformities noted. ENT: ears appear grossly normal no exudates. Neck: Supple. No thyromegaly. No LAD. Respiratory: no rhonchi noted. Cardiovascular: Normal S1 and S2 without murmur or rub. Extremities: No cyanosis. pulses are equal.  Neurologic: Alert and oriented. No involuntary movements.  LABS: No results found for this or any previous visit (from the past 2160 hours).  Radiology: DG Shoulder Left Result Date: 05/21/2024 EXAM: 3 VIEW(S) XRAY OF THE LEFT SHOULDER 05/21/2024 05:19:15 AM COMPARISON: None available. CLINICAL HISTORY: L shoulder pain after fall. Images best of patient ability, due to pain, limited movement. FINDINGS: BONES AND JOINTS: Glenohumeral joint is normally aligned. No acute fracture or dislocation. The [facility] joint is unremarkable in appearance. SOFT TISSUES: No abnormal calcifications. Visualized lung is unremarkable. IMPRESSION: 1. No acute fracture or dislocation. Electronically signed by: Lonni Necessary MD 05/21/2024 05:39 AM EDT RP Workstation: [id number]    No results found.  DG Shoulder Left Result Date: 05/21/2024 EXAM: 3 VIEW(S) XRAY OF THE LEFT SHOULDER 05/21/2024 05:19:15 AM COMPARISON: None available. CLINICAL HISTORY: L shoulder pain after fall. Images best of patient ability, due to pain, limited movement. FINDINGS: BONES AND JOINTS: Glenohumeral joint is normally aligned. No acute fracture or dislocation. The [facility] joint is unremarkable in appearance. SOFT TISSUES: No abnormal calcifications. Visualized lung is unremarkable. IMPRESSION: 1. No acute fracture or dislocation. Electronically signed by: Lonni Necessary MD 05/21/2024 05:39 AM EDT RP Workstation: [id number]   DG Wrist Complete Left Result Date: 05/21/2024 CLINICAL DATA:  Fall, laceration EXAM: LEFT WRIST - COMPLETE 3+ VIEW COMPARISON:  None available FINDINGS: Gas within the soft tissues adjacent to the 1st metacarpal. No  acute bony abnormality. Specifically, no fracture, subluxation, or dislocation. No radiopaque foreign body. IMPRESSION: No fracture or radiopaque foreign body. Electronically Signed   By: Franky Crease M.D.   On: 05/21/2024 00:14    Assessment and Plan: Patient Active Problem List   Diagnosis Date Noted   S/P total knee arthroplasty, right 01/14/2024   Mixed hyperlipidemia 10/01/2023   S/P TKR (total knee replacement) using cement, left 01/26/2023   Allergic rhinitis 07/02/2022   Wound dehiscence    S/P hernia repair 05/27/2022   Chronic allergic conjunctivitis 12/02/2021   Allergic rhinitis due to animal (cat) (dog) hair and dander 12/02/2021   Allergic rhinitis due to pollen 12/02/2021   STEMI (ST elevation myocardial infarction) (HCC) 06/06/2021   History of colonic polyps    Candidiasis of skin 01/04/2020   Ventral hernia without obstruction or gangrene 11/28/2019   Herpes simplex infection 10/27/2019   Stomatitis and mucositis 10/27/2019   Vitamin B12 deficiency 06/30/2018   Unspecified menopausal and perimenopausal disorder 06/30/2018   Vitamin D  deficiency 06/30/2018   Acquired hypothyroidism 02/05/2018   Essential hypertension 02/05/2018   Obstructive sleep apnea (adult) (pediatric) 01/06/2018   1. OSA (obstructive sleep apnea) (Primary) 97% compliance and an AHI of only1.5 download data was reviwed  2. Obesity, morbid (HCC) Obesity Counseling: Had a lengthy discussion regarding patients BMI and weight issues. Patient was instructed on portion control as well as increased activity. Also discussed caloric restrictions with trying to maintain intake less than 2000 Kcal. Discussions were made in accordance with the 5As of weight management. Simple actions such as not eating late and if able to, taking a walk is suggested.     General Counseling: I have discussed the findings of the evaluation and examination with Hadassah.  I have also discussed any further diagnostic evaluation  thatmay be needed or ordered today. Avangelina verbalizes understanding of the findings of todays visit. We also reviewed her medications today and discussed drug interactions and side effects including but not limited excessive drowsiness and altered mental states. We also discussed that there is  always a risk not just to her but also people around her. she has been encouraged to call the office with any questions or concerns that should arise related to todays visit.  No orders of the defined types were placed in this encounter.    Time spent: 49  I have personally obtained a history, examined the patient, evaluated laboratory and imaging results, formulated the assessment and plan and placed orders.    Elfreda DELENA Bathe, MD [facility] Pulmonary and Critical Care Sleep medicine

## 2024-06-16 ENCOUNTER — Ambulatory Visit: Admitting: Physician Assistant

## 2024-09-08 ENCOUNTER — Encounter: Payer: Self-pay | Admitting: Surgery

## 2024-09-08 ENCOUNTER — Other Ambulatory Visit: Payer: Self-pay

## 2024-09-08 DIAGNOSIS — I10 Essential (primary) hypertension: Secondary | ICD-10-CM

## 2024-09-08 MED ORDER — METOPROLOL TARTRATE 25 MG PO TABS: 25.0000 mg | ORAL_TABLET | Freq: Two times a day (BID) | ORAL | 1 refills | Status: AC

## 2024-10-18 ENCOUNTER — Ambulatory Visit: Admitting: Internal Medicine

## 2024-10-18 ENCOUNTER — Encounter: Payer: Self-pay | Admitting: Internal Medicine

## 2024-10-18 VITALS — BP 144/85 | HR 64 | Temp 98.0°F | Resp 16 | Ht <= 58 in | Wt 167.0 lb

## 2024-10-18 DIAGNOSIS — G4733 Obstructive sleep apnea (adult) (pediatric): Secondary | ICD-10-CM

## 2024-10-18 DIAGNOSIS — Z7189 Other specified counseling: Secondary | ICD-10-CM

## 2024-10-18 NOTE — Progress Notes (Unsigned)
 [facility] [address] [place], [place] [address]  Pulmonary Sleep Medicine   Office Visit Note  Patient Name: Claudia Martin DOB: 1945-11-29 MRN [id number]  Date of Service: 10/18/2024  Complaints/HPI: She has been doing well with her sleep She has 93% usage and her AHI was only 1.1per hour. She has excellent compliance. Notes more vivid dreams. No cough or congestion noted. Denies shortness of breath  Office Spirometry Results:     ROS  General: (-) fever, (-) chills, (-) night sweats, (-) weakness Skin: (-) rashes, (-) itching,. Eyes: (-) visual changes, (-) redness, (-) itching. Nose and Sinuses: (-) nasal stuffiness or itchiness, (-) postnasal drip, (-) nosebleeds, (-) sinus trouble. Mouth and Throat: (-) sore throat, (-) hoarseness. Neck: (-) swollen glands, (-) enlarged thyroid , (-) neck pain. Respiratory: - cough, (-) bloody sputum, - shortness of breath, - wheezing. Cardiovascular: - ankle swelling, (-) chest pain. Lymphatic: (-) lymph node enlargement. Neurologic: (-) numbness, (-) tingling. Psychiatric: (-) anxiety, (-) depression   Current Medication: Outpatient Encounter Medications as of 10/18/2024  Medication Sig Note   acetaminophen  (TYLENOL ) 500 MG tablet Take 2 tablets (1,000 mg total) by mouth every 8 (eight) hours.    acetaminophen  (TYLENOL ) 500 MG tablet Take 2 tablets (1,000 mg total) by mouth every 6 (six) hours as needed.    aspirin  81 MG chewable tablet Chew 1 tablet (81 mg total) by mouth daily.    cetirizine (ZYRTEC) 10 MG tablet Take 10 mg by mouth every morning.    chlorhexidine  (PERIDEX ) 0.12 % solution USE 5 ML AS DIRECTED IN THE MOUTH OR THROAT TWICE DAILY 01/14/2024: prn   docusate sodium  (COLACE) 100 MG capsule Take 1 capsule (100 mg total) by mouth 2 (two) times daily.    EPINEPHrine  0.3 mg/0.3 mL IJ SOAJ injection Inject 0.3 mg into the muscle as needed for anaphylaxis. 01/14/2024: prn   hydrOXYzine  (VISTARIL ) 25 MG  capsule Take 1 capsule (25 mg total) by mouth every 8 (eight) hours as needed.    levothyroxine  (SYNTHROID ) 50 MCG tablet TAKE 1 TABLET(50 MCG) BY MOUTH DAILY BEFORE BREAKFAST    losartan  (COZAAR ) 25 MG tablet TAKE 1 TABLET(25 MG) BY MOUTH TWICE DAILY    metoprolol  tartrate (LOPRESSOR ) 25 MG tablet Take 1 tablet (25 mg total) by mouth 2 (two) times daily.    miconazole (MONISTAT 7) 2 % vaginal cream Place 1 Applicatorful vaginally daily as needed (irritation).    nitroGLYCERIN  (NITROSTAT ) 0.4 MG SL tablet Place 1 tablet (0.4 mg total) under the tongue every 5 (five) minutes x 3 doses as needed for chest pain. 01/14/2024: prn   nystatin -triamcinolone  ointment (MYCOLOG) Apply 1 Application topically 2 (two) times daily. To rash in lower abdomen until resolved.    rosuvastatin  (CRESTOR ) 5 MG tablet Take 1 tablet (5 mg total) by mouth every evening.    valACYclovir  (VALTREX ) 1000 MG tablet TAKE 1 TABLET BY MOUTH DAILY. CAN TAKE 1 TABLET BY MOUTH TWICE DAILY IF FLARE UP FOR 7 DAYS    No facility-administered encounter medications on file as of 10/18/2024.    Surgical History: Past Surgical History:  Procedure Laterality Date   ABDOMINAL WALL DEFECT REPAIR N/A 05/27/2022   Procedure: REPAIR ABDOMINAL WALL, reconstruction;  Surgeon: Jordis Laneta FALCON, MD;  Location: [facility];  Service: General;  Laterality: N/A;   BREAST BIOPSY Bilateral    benign   CATARACT EXTRACTION W/PHACO Right 06/05/2021   Procedure: CATARACT EXTRACTION PHACO AND INTRAOCULAR LENS PLACEMENT (IOC) RIGHT;  Surgeon: Mittie Gaskin, MD;  Location: [facility];  Service: Ophthalmology;  Laterality: Right;  3.02 00:39.5   CATARACT EXTRACTION W/PHACO Left 12/04/2021   Procedure: CATARACT EXTRACTION PHACO AND INTRAOCULAR LENS PLACEMENT (IOC) LEFT;  Surgeon: Mittie Gaskin, MD;  Location: [facility];  Service: Ophthalmology;  Laterality: Left;  6.23 00:48.0   COLON SURGERY     Diverticulitis   COLONOSCOPY WITH  PROPOFOL  N/A 03/15/2018   Procedure: COLONOSCOPY WITH PROPOFOL ;  Surgeon: Unk Corinn Skiff, MD;  Location: [facility];  Service: Gastroenterology;  Laterality: N/A;   COLONOSCOPY WITH PROPOFOL  N/A 01/09/2021   Procedure: COLONOSCOPY WITH PROPOFOL ;  Surgeon: Unk Corinn Skiff, MD;  Location: [facility] END[facility];  Service: Gastroenterology;  Laterality: N/A;   CORONARY/GRAFT ACUTE MI REVASCULARIZATION N/A 06/06/2021   Procedure: Coronary/Graft Acute MI Revascularization;  Surgeon: Florencio Cara BIRCH, MD;  Location: [facility] INVASIVE CV LAB;  Service: Cardiovascular;  Laterality: N/A;   ECTOPIC PREGNANCY SURGERY     fx thumb     HERNIA REPAIR     INSERTION OF MESH N/A 05/27/2022   Procedure: INSERTION OF MESH;  Surgeon: Jordis Laneta FALCON, MD;  Location: [facility];  Service: General;  Laterality: N/A;   LEFT HEART CATH AND CORONARY ANGIOGRAPHY N/A 06/06/2021   Procedure: LEFT HEART CATH AND CORONARY ANGIOGRAPHY;  Surgeon: Florencio Cara BIRCH, MD;  Location: [facility] CV [facility];  Service: Cardiovascular;  Laterality: N/A;   TOTAL KNEE ARTHROPLASTY Left 01/26/2023   Procedure: TOTAL KNEE ARTHROPLASTY;  Surgeon: Lorelle Hussar, MD;  Location: [facility] OR[facility];  Service: Orthopedics;  Laterality: Left;   TOTAL KNEE ARTHROPLASTY Right 01/14/2024   Procedure: ARTHROPLASTY, KNEE, TOTAL;  Surgeon: Lorelle Hussar, MD;  Location: [facility] OR[facility];  Service: Orthopedics;  Laterality: Right;   VENTRAL HERNIA REPAIR N/A 05/27/2022   Procedure: HERNIA REPAIR VENTRAL ADULT, recurrent;  Surgeon: Jordis Laneta FALCON, MD;  Location: [facility] OR[facility];  Service: General;  Laterality: N/A;    Medical History: Past Medical History:  Diagnosis Date   Allergic rhinitis    Aortic atherosclerosis    Arthritis    Bifascicular block (RBBB + LAFB)    CAD (coronary artery disease) 06/06/2021   a.) STEMI --> LHC: EF 55-65%, 100% RI --> PCI performed placing a 2.5 x 18 mm Onyx Frontier DES   Cellulitis and abscess    Diastolic dysfunction 06/07/2021    a.) TTE 06/07/2021: EF 55-60%, no RWMAs, triv MR, mild AoV sclerosis with no stenosis, G1DD; b.) TTE 07/17/2021: EF >55%, mild LVH, no RWMAs; triv TR, mild MR, G1DD   Diverticulosis    Enlarged heart    Eyelid inflammation    Frequent nosebleeds    GERD (gastroesophageal reflux disease)    Hematuria syndrome    Hemorrhoids    Herpes genitalis    Herpes simplex    History of bilateral cataract extraction    History of colonic polyps    History of methicillin resistant staphylococcus aureus (MRSA) 2021   HTN (hypertension)    Hydronephrosis    Hyperlipidemia    Hypothyroidism    Incomplete bladder emptying    Internal hordeolum    Long term current use of antithrombotics/antiplatelets    a.) on daily DAPT therapy (ASA + ticagrelor ) x 1 year s/p STEMI; ticagrelor  has been discontinued   OSA on CPAP    PONV (postoperative nausea and vomiting)    Primary ovarian failure    Sciatica    Shortness of breath    Sinus bradycardia    STEMI (ST  elevation myocardial infarction) (HCC) 06/06/2021   a.) LHC/PCI 06/06/2021 --> 100% RI (2.5 x 18 mm Onyx Frontier DES)   Urethral diverticulum    Vaginal atrophy    Vitamin B12 deficiency    Vitamin D  deficiency     Family History: Family History  Problem Relation Age of Onset   Tuberculosis Mother    Nephrolithiasis Brother    Vision loss Daughter    Hypertension Daughter     Social History: Social History   Socioeconomic History   Marital status: Divorced    Spouse name: Not on file   Number of children: Not on file   Years of education: Not on file   Highest education level: Not on file  Occupational History   Not on file  Tobacco Use   Smoking status: Never    Passive exposure: Never   Smokeless tobacco: Never  Vaping Use   Vaping status: Never Used  Substance and Sexual Activity   Alcohol use: No   Drug use: Never   Sexual activity: Not on file  Other Topics Concern   Not on file  Social History Narrative   Not on  file   Social Drivers of Health   Tobacco Use: Low Risk (10/18/2024)   Patient History    Smoking Tobacco Use: Never    Smokeless Tobacco Use: Never    Passive Exposure: Never  Financial Resource Strain: Low Risk  (05/24/2024)   Received from [facility]   Overall Financial Resource Strain (CARDIA)    Difficulty of Paying Living Expenses: Not hard at all  Food Insecurity: No Food Insecurity (05/24/2024)   Received from [facility]   Epic    Within the past 12 months, you worried that your food would run out before you got the money to buy more.: Never true    Within the past 12 months, the food you bought just didn't last and you didn't have money to get more.: Never true  Transportation Needs: No Transportation Needs (05/24/2024)   Received from [facility] - Transportation    In the past 12 months, has lack of transportation kept you from medical appointments or from getting medications?: No    Lack of Transportation (Non-Medical): No  Physical Activity: Not on file  Stress: Not on file  Social Connections: Socially Isolated (01/14/2024)   Social Connection and Isolation Panel    Frequency of Communication with Friends and Family: Once a week    Frequency of Social Gatherings with Friends and Family: Once a week    Attends Religious Services: Never    Database Administrator or Organizations: No    Attends Banker Meetings: Never    Marital Status: Divorced  Catering Manager Violence: Not At Risk (01/14/2024)   Humiliation, Afraid, Rape, and Kick questionnaire    Fear of Current or Ex-Partner: No    Emotionally Abused: No    Physically Abused: No    Sexually Abused: No  Depression (PHQ2-9): Medium Risk (12/08/2023)   Depression (PHQ2-9)    PHQ-2 Score: 8  Alcohol Screen: Low Risk (06/17/2022)   Alcohol Screen    Last Alcohol Screening Score (AUDIT): 0  Housing: Low Risk  (05/24/2024)   Received from [facility]   Epic    In the last 12 months, was there a time when you were not able to pay the mortgage or rent on time?: No  In the past 12 months, how many times have you moved where you were living?: 0    At any time in the past 12 months, were you homeless or living in a shelter (including now)?: No  Utilities: Not At Risk (05/24/2024)   Received from [facility]   Epic    In the past 12 months has the electric, gas, oil, or water company threatened to shut off services in your home?: No  Health Literacy: Not on file    Vital Signs: Blood pressure (!) 144/85, pulse 64, temperature 98 F (36.7 C), resp. rate 16, height 4' 9 (1.448 m), weight 167 lb (75.8 kg), SpO2 98%.  Examination: General Appearance: The patient is well-developed, well-nourished, and in no distress. Skin: Gross inspection of skin unremarkable. Head: normocephalic, no gross deformities. Eyes: no gross deformities noted. ENT: ears appear grossly normal no exudates. Neck: Supple. No thyromegaly. No LAD. Respiratory: no rhonchi noted. Cardiovascular: Normal S1 and S2 without murmur or rub. Extremities: No cyanosis. pulses are equal. Neurologic: Alert and oriented. No involuntary movements.  LABS: No results found for this or any previous visit (from the past 2160 hours).  Radiology: DG Shoulder Left Result Date: 05/21/2024 EXAM: 3 VIEW(S) XRAY OF THE LEFT SHOULDER 05/21/2024 05:19:15 AM COMPARISON: None available. CLINICAL HISTORY: L shoulder pain after fall. Images best of patient ability, due to pain, limited movement. FINDINGS: BONES AND JOINTS: Glenohumeral joint is normally aligned. No acute fracture or dislocation. The [facility] joint is unremarkable in appearance. SOFT TISSUES: No abnormal calcifications. Visualized lung is unremarkable. IMPRESSION: 1. No acute fracture or dislocation. Electronically signed by: Lonni Necessary MD 05/21/2024 05:39 AM EDT RP Workstation:  [id number]    No results found.  No results found.  Assessment and Plan: Patient Active Problem List   Diagnosis Date Noted   S/P total knee arthroplasty, right 01/14/2024   Mixed hyperlipidemia 10/01/2023   S/P TKR (total knee replacement) using cement, left 01/26/2023   Allergic rhinitis 07/02/2022   Wound dehiscence    S/P hernia repair 05/27/2022   Chronic allergic conjunctivitis 12/02/2021   Allergic rhinitis due to animal (cat) (dog) hair and dander 12/02/2021   Allergic rhinitis due to pollen 12/02/2021   STEMI (ST elevation myocardial infarction) (HCC) 06/06/2021   History of colonic polyps    Candidiasis of skin 01/04/2020   Ventral hernia without obstruction or gangrene 11/28/2019   Herpes simplex infection 10/27/2019   Stomatitis and mucositis 10/27/2019   Vitamin B12 deficiency 06/30/2018   Unspecified menopausal and perimenopausal disorder 06/30/2018   Vitamin D  deficiency 06/30/2018   Acquired hypothyroidism 02/05/2018   Essential hypertension 02/05/2018   Obstructive sleep apnea (adult) (pediatric) 01/06/2018    1. OSA (obstructive sleep apnea) (Primary) *** - For home use only DME continuous positive airway pressure (CPAP)  2. CPAP use counseling ***  3. Obesity, morbid (HCC) ***   General Counseling: I have discussed the findings of the evaluation and examination with Hadassah.  I have also discussed any further diagnostic evaluation thatmay be needed or ordered today. Jaemarie verbalizes understanding of the findings of todays visit. We also reviewed her medications today and discussed drug interactions and side effects including but not limited excessive drowsiness and altered mental states. We also discussed that there is always a risk not just to her but also people around her. she has been encouraged to call the office with any questions or concerns that should arise related to todays visit.  No orders of  the defined types were placed in this encounter.     Time spent: 69  I have personally obtained a history, examined the patient, evaluated laboratory and imaging results, formulated the assessment and plan and placed orders.    Elfreda DELENA Bathe, MD [facility] Pulmonary and Critical Care Sleep medicine

## 2024-10-19 ENCOUNTER — Telehealth: Payer: Self-pay | Admitting: Internal Medicine

## 2024-10-19 NOTE — Telephone Encounter (Signed)
 Cpap supply order, compliance download, office prior to SS, SS and Cpap titration faxed to [facility]; [phone]

## 2024-10-28 ENCOUNTER — Other Ambulatory Visit: Payer: Self-pay | Admitting: Nurse Practitioner

## 2024-10-28 DIAGNOSIS — E782 Mixed hyperlipidemia: Secondary | ICD-10-CM

## 2024-12-08 ENCOUNTER — Ambulatory Visit: Admitting: Nurse Practitioner

## 2025-06-26 ENCOUNTER — Ambulatory Visit: Admitting: Internal Medicine

## 2025-10-23 ENCOUNTER — Ambulatory Visit: Admitting: Internal Medicine
